# Patient Record
Sex: Female | Born: 1960 | Race: Black or African American | Hispanic: No | Marital: Single | Smoking: Former smoker
Health system: Southern US, Community
[De-identification: ages and names within clinical notes are randomized; demographics above are authoritative.]

## PROBLEM LIST (undated history)

## (undated) DIAGNOSIS — F32A Depression, unspecified: Secondary | ICD-10-CM

## (undated) DIAGNOSIS — N189 Chronic kidney disease, unspecified: Secondary | ICD-10-CM

## (undated) DIAGNOSIS — I10 Essential (primary) hypertension: Secondary | ICD-10-CM

## (undated) DIAGNOSIS — T7840XA Allergy, unspecified, initial encounter: Secondary | ICD-10-CM

## (undated) DIAGNOSIS — J45909 Unspecified asthma, uncomplicated: Secondary | ICD-10-CM

## (undated) DIAGNOSIS — M109 Gout, unspecified: Secondary | ICD-10-CM

## (undated) DIAGNOSIS — I722 Aneurysm of renal artery: Secondary | ICD-10-CM

## (undated) DIAGNOSIS — E785 Hyperlipidemia, unspecified: Secondary | ICD-10-CM

## (undated) DIAGNOSIS — F329 Major depressive disorder, single episode, unspecified: Secondary | ICD-10-CM

## (undated) HISTORY — DX: Chronic kidney disease, unspecified: N18.9

## (undated) HISTORY — DX: Unspecified asthma, uncomplicated: J45.909

## (undated) HISTORY — PX: TOTAL ABDOMINAL HYSTERECTOMY: SHX209

## (undated) HISTORY — DX: Hyperlipidemia, unspecified: E78.5

## (undated) HISTORY — PX: X-STOP IMPLANTATION: SHX2677

## (undated) HISTORY — DX: Allergy, unspecified, initial encounter: T78.40XA

## (undated) HISTORY — DX: Depression, unspecified: F32.A

## (undated) HISTORY — DX: Major depressive disorder, single episode, unspecified: F32.9

## (undated) HISTORY — PX: ABDOMINAL HYSTERECTOMY: SHX81

## (undated) HISTORY — PX: OTHER SURGICAL HISTORY: SHX169

---

## 2000-03-21 ENCOUNTER — Encounter: Admission: RE | Admit: 2000-03-21 | Discharge: 2000-03-21 | Payer: Self-pay | Admitting: Internal Medicine

## 2000-03-21 ENCOUNTER — Encounter: Payer: Self-pay | Admitting: Internal Medicine

## 2000-05-24 ENCOUNTER — Encounter: Payer: Self-pay | Admitting: Emergency Medicine

## 2000-05-24 ENCOUNTER — Emergency Department (HOSPITAL_COMMUNITY): Admission: EM | Admit: 2000-05-24 | Discharge: 2000-05-24 | Payer: Self-pay | Admitting: Emergency Medicine

## 2001-03-30 ENCOUNTER — Emergency Department (HOSPITAL_COMMUNITY): Admission: EM | Admit: 2001-03-30 | Discharge: 2001-03-30 | Payer: Self-pay | Admitting: Internal Medicine

## 2001-08-27 ENCOUNTER — Encounter: Payer: Self-pay | Admitting: Internal Medicine

## 2001-08-27 ENCOUNTER — Ambulatory Visit (HOSPITAL_COMMUNITY): Admission: RE | Admit: 2001-08-27 | Discharge: 2001-08-27 | Payer: Self-pay | Admitting: Internal Medicine

## 2002-10-23 ENCOUNTER — Emergency Department (HOSPITAL_COMMUNITY): Admission: EM | Admit: 2002-10-23 | Discharge: 2002-10-23 | Payer: Self-pay | Admitting: Emergency Medicine

## 2002-12-30 ENCOUNTER — Observation Stay (HOSPITAL_COMMUNITY): Admission: EM | Admit: 2002-12-30 | Discharge: 2002-12-31 | Payer: Self-pay | Admitting: *Deleted

## 2002-12-30 ENCOUNTER — Encounter: Payer: Self-pay | Admitting: Emergency Medicine

## 2002-12-31 ENCOUNTER — Encounter: Payer: Self-pay | Admitting: Cardiology

## 2002-12-31 ENCOUNTER — Encounter: Admission: RE | Admit: 2002-12-31 | Discharge: 2002-12-31 | Payer: Self-pay | Admitting: Cardiology

## 2003-01-17 ENCOUNTER — Emergency Department (HOSPITAL_COMMUNITY): Admission: EM | Admit: 2003-01-17 | Discharge: 2003-01-17 | Payer: Self-pay | Admitting: Emergency Medicine

## 2004-01-30 ENCOUNTER — Encounter: Admission: RE | Admit: 2004-01-30 | Discharge: 2004-01-30 | Payer: Self-pay | Admitting: Cardiovascular Disease

## 2004-02-03 ENCOUNTER — Encounter: Admission: RE | Admit: 2004-02-03 | Discharge: 2004-02-03 | Payer: Self-pay | Admitting: Cardiovascular Disease

## 2004-07-29 ENCOUNTER — Ambulatory Visit (HOSPITAL_COMMUNITY): Admission: RE | Admit: 2004-07-29 | Discharge: 2004-07-29 | Payer: Self-pay | Admitting: Cardiovascular Disease

## 2004-12-27 ENCOUNTER — Emergency Department (HOSPITAL_COMMUNITY): Admission: EM | Admit: 2004-12-27 | Discharge: 2004-12-27 | Payer: Self-pay | Admitting: Emergency Medicine

## 2005-09-20 ENCOUNTER — Ambulatory Visit (HOSPITAL_COMMUNITY): Admission: RE | Admit: 2005-09-20 | Discharge: 2005-09-20 | Payer: Self-pay | Admitting: Cardiovascular Disease

## 2010-07-14 ENCOUNTER — Emergency Department (HOSPITAL_COMMUNITY): Admission: EM | Admit: 2010-07-14 | Discharge: 2010-07-14 | Payer: Self-pay | Admitting: Emergency Medicine

## 2010-07-17 ENCOUNTER — Emergency Department (HOSPITAL_COMMUNITY): Admission: EM | Admit: 2010-07-17 | Discharge: 2010-07-17 | Payer: Self-pay | Admitting: Emergency Medicine

## 2010-10-31 ENCOUNTER — Encounter: Payer: Self-pay | Admitting: Internal Medicine

## 2010-10-31 ENCOUNTER — Encounter: Payer: Self-pay | Admitting: Cardiovascular Disease

## 2014-07-23 ENCOUNTER — Emergency Department (HOSPITAL_COMMUNITY)
Admission: EM | Admit: 2014-07-23 | Discharge: 2014-07-23 | Disposition: A | Discharge status: Home / Self Care | Payer: Managed Care, Other (non HMO) | Attending: Emergency Medicine | Admitting: Emergency Medicine

## 2014-07-23 ENCOUNTER — Emergency Department (HOSPITAL_COMMUNITY): Payer: Managed Care, Other (non HMO)

## 2014-07-23 ENCOUNTER — Encounter (HOSPITAL_COMMUNITY): Payer: Self-pay | Admitting: Emergency Medicine

## 2014-07-23 DIAGNOSIS — I10 Essential (primary) hypertension: Secondary | ICD-10-CM | POA: Insufficient documentation

## 2014-07-23 DIAGNOSIS — M25461 Effusion, right knee: Secondary | ICD-10-CM | POA: Diagnosis not present

## 2014-07-23 DIAGNOSIS — Z7901 Long term (current) use of anticoagulants: Secondary | ICD-10-CM | POA: Diagnosis not present

## 2014-07-23 DIAGNOSIS — Z720 Tobacco use: Secondary | ICD-10-CM | POA: Diagnosis not present

## 2014-07-23 DIAGNOSIS — M25561 Pain in right knee: Secondary | ICD-10-CM | POA: Diagnosis present

## 2014-07-23 DIAGNOSIS — Z79899 Other long term (current) drug therapy: Secondary | ICD-10-CM | POA: Insufficient documentation

## 2014-07-23 HISTORY — DX: Essential (primary) hypertension: I10

## 2014-07-23 HISTORY — DX: Aneurysm of renal artery: I72.2

## 2014-07-23 LAB — SYNOVIAL CELL COUNT + DIFF, W/ CRYSTALS
Crystals, Fluid: NONE SEEN
Eosinophils-Synovial: 0 % (ref 0–1)
Lymphocytes-Synovial Fld: 0 % (ref 0–20)
Monocyte-Macrophage-Synovial Fluid: 14 % — ABNORMAL LOW (ref 50–90)
Neutrophil, Synovial: 86 % — ABNORMAL HIGH (ref 0–25)
Other Cells-SYN: 0
WBC, Synovial: 36750 /mm3 — ABNORMAL HIGH (ref 0–200)

## 2014-07-23 LAB — BODY FLUID CELL COUNT WITH DIFFERENTIAL

## 2014-07-23 MED ORDER — LIDOCAINE HCL (PF) 1 % IJ SOLN
INTRAMUSCULAR | Status: AC
  Filled 2014-07-23: qty 5

## 2014-07-23 MED ORDER — NAPROXEN 250 MG PO TABS
500.0000 mg | ORAL_TABLET | Freq: Once | ORAL | Status: AC
  Administered 2014-07-23: 500 mg via ORAL
  Filled 2014-07-23: qty 2

## 2014-07-23 MED ORDER — NAPROXEN 500 MG PO TABS: 500.0000 mg | ORAL_TABLET | Freq: Two times a day (BID) | ORAL | Status: DC

## 2014-07-23 NOTE — Discharge Instructions (Signed)
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
## 2014-07-23 NOTE — ED Notes (Signed)
nad noted prior to dc. Dc instructions reviewed and explained. 1 Rx called in to [facility] in [place] , [place]
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
## 2014-07-23 NOTE — ED Provider Notes (Signed)
Procedure note.   Right knee tapped.   30 cc of straw colored fluid removed.   Skin numbed with 1% lido no epi.   18 gauge needle used to drain fluid from right knee  Maudry Diego, MD 07/23/14 [phone]

## 2014-07-27 LAB — BODY FLUID CULTURE: Culture: NO GROWTH

## 2014-07-27 NOTE — ED Provider Notes (Signed)
Medical screening examination/treatment/procedure(s) were performed by non-physician practitioner and as supervising physician I was immediately available for consultation/collaboration.   EKG Interpretation None        Maudry Diego, MD 07/27/14 0020

## 2015-01-22 NOTE — H&P (Signed)
  NTS SOAP Note  Vital Signs:  Vitals as of: 4/92/0100: Systolic 712: Diastolic 80: Heart Rate 56: Temp 97.70F: Height 4ft 6in: Weight 183Lbs 0 Ounces: BMI 29.54  BMI : 29.54 kg/m2  Subjective: This 54 year old female presents for of right upper quadrant abdominal pain.  Has been occurring over the past few months.  Made worse with fatty food.  Nausea,  vomiting.  No fever,  chills,  jaundice.  U/S of gallbladder shows cholelithiasis,  normal common bile duct.  LFT's in past normal.   Review of Symptoms:  Constitutional:fatigue Head:unremarkable Eyes:pain bilateral ear infection,  sinus problems Cardiovascular:  unremarkable Respiratory:unremarkable Gastrointestinabdominal pain, nausea, vomiting, heartburn Genitourinary:unremarkable   joint and back pain Skin:unremarkable Hematolgic/Lymphatic:on plavix   Allergic/Immunologic:unremarkable   Past Medical History:  Reviewed  Past Medical History  Surgical History: foot surgery,  TAH Medical Problems: renal artery stenosis,  HTN,  reflux Allergies: shellfish Medications: dilacor,  triamterene/HCTZ,  atenolol,  plavix (which she is holding),  KCL, nexium    Social History:Reviewed  Social History  Preferred Language: English Race:  Black or African American Ethnicity: Not Hispanic / Latino Age: 54 year Marital Status:  S Alcohol: no   Smoking Status: Current some day smoker reviewed on 01/22/2015 Started Date:  Packs per day: 0.50 Functional Status reviewed on 01/22/2015 ------------------------------------------------ Bathing: Normal Cooking: Normal Dressing: Normal Driving: Normal Eating: Normal Managing Meds: Normal Oral Care: Normal Shopping: Normal Toileting: Normal Transferring: Normal Walking: Normal Cognitive Status reviewed on 01/22/2015 ------------------------------------------------ Attention: Normal Decision Making: Normal Language: Normal Memory: Normal Motor:  Normal Perception: Normal Problem Solving: Normal Visual and Spatial: Normal   Family History:Reviewed  Family Health History Mother, Living; Healthy;  Father, Living; Colon cancer; Prostate cancer; Heart block (conduction disorder);     Objective Information: General:Well appearing, well nourished in no distress. Heart:RRR, no murmur Lungs:  CTA bilaterally, no wheezes, rhonchi, rales.  Breathing unlabored. Abdomen:Soft, mild tenderness to palpation right upper quadrant,  ND, no HSM, no masses.  Assessment:Cholelithiasis  Diagnoses: 574.20  K80.20 Gallstone (Calculus of gallbladder without cholecystitis without obstruction)  Procedures: 19758 - OFFICE OUTPATIENT NEW 30 MINUTES    Plan:  Scheduled for laparoscopic cholecystectomy on 01/28/15.  Continue to hold plavix.   Patient Education:Alternative treatments to surgery were discussed with patient (and family).  Risks and benefits  of procedure including bleeding,  infection,  hepatobiliary injury,  and the possibility of an open procedure were fully explained to the patient (and family) who gave informed consent. Patient/family questions were addressed.  Follow-up:Pending Surgery

## 2015-01-23 NOTE — Patient Instructions (Signed)
Jaclyn Brooks  01/23/2015   Your procedure is scheduled on:  01/28/2015  Report to [facility] at  700  AM.  Call this number if you have problems the morning of surgery: [phone]   Remember:   Do not eat food or drink liquids after midnight.   Take these medicines the morning of surgery with A SIP OF WATER:  Atenolol, cardizem, percocet, uloric   Do not wear jewelry, make-up or nail polish.  Do not wear lotions, powders, or perfumes.   Do not shave 48 hours prior to surgery. Men may shave face and neck.  Do not bring valuables to the hospital.  [facility] is not responsible for any belongings or valuables.               Contacts, dentures or bridgework may not be worn into surgery.  Leave suitcase in the car. After surgery it may be brought to your room.  For patients admitted to the hospital, discharge time is determined by your treatment team.               Patients discharged the day of surgery will not be allowed to drive home.  Name and phone number of your driver: family  Special Instructions: Shower using CHG 2 nights before surgery and the night before surgery.  If you shower the day of surgery use CHG.  Use special wash - you have one bottle of CHG for all showers.  You should use approximately 1/3 of the bottle for each shower.   Please read over the following fact sheets that you were given: Pain Booklet, Coughing and Deep Breathing, Surgical Site Infection Prevention, Anesthesia Post-op Instructions and Care and Recovery After Surgery Laparoscopic Cholecystectomy Laparoscopic cholecystectomy is surgery to remove the gallbladder. The gallbladder is located in the upper right part of the abdomen, behind the liver. It is a storage sac for bile produced in the liver. Bile aids in the digestion and absorption of fats. Cholecystectomy is often done for inflammation of the gallbladder (cholecystitis). This condition is usually caused by a buildup of gallstones  (cholelithiasis) in your gallbladder. Gallstones can block the flow of bile, resulting in inflammation and pain. In severe cases, emergency surgery may be required. When emergency surgery is not required, you will have time to prepare for the procedure. Laparoscopic surgery is an alternative to open surgery. Laparoscopic surgery has a shorter recovery time. Your common bile duct may also need to be examined during the procedure. If stones are found in the common bile duct, they may be removed. LET [facility] CARE PROVIDER KNOW ABOUT:  Any allergies you have.  All medicines you are taking, including vitamins, herbs, eye drops, creams, and over-the-counter medicines.  Previous problems you or members of your family have had with the use of anesthetics.  Any blood disorders you have.  Previous surgeries you have had.  Medical conditions you have. RISKS AND COMPLICATIONS Generally, this is a safe procedure. However, as with any procedure, complications can occur. Possible complications include:  Infection.  Damage to the common bile duct, nerves, arteries, veins, or other internal organs such as the stomach, liver, or intestines.  Bleeding.  A stone may remain in the common bile duct.  A bile leak from the cyst duct that is clipped when your gallbladder is removed.  The need to convert to open surgery, which requires a larger incision in the abdomen. This may be necessary if your surgeon  thinks it is not safe to continue with a laparoscopic procedure. BEFORE THE PROCEDURE  Ask your health care provider about changing or stopping any regular medicines. You will need to stop taking aspirin or blood thinners at least 5 days prior to surgery.  Do not eat or drink anything after midnight the night before surgery.  Let your health care provider know if you develop a cold or other infectious problem before surgery. PROCEDURE   You will be given medicine to make you sleep through the  procedure (general anesthetic). A breathing tube will be placed in your mouth.  When you are asleep, your surgeon will make several small cuts (incisions) in your abdomen.  A thin, lighted tube with a tiny camera on the end (laparoscope) is inserted through one of the small incisions. The camera on the laparoscope sends a picture to a TV screen in the operating room. This gives the surgeon a good view inside your abdomen.  A gas will be pumped into your abdomen. This expands your abdomen so that the surgeon has more room to perform the surgery.  Other tools needed for the procedure are inserted through the other incisions. The gallbladder is removed through one of the incisions.  After the removal of your gallbladder, the incisions will be closed with stitches, staples, or skin glue. AFTER THE PROCEDURE  You will be taken to a recovery area where your progress will be checked often.  You may be allowed to go home the same day if your pain is controlled and you can tolerate liquids. Document Released: 09/26/2005 Document Revised: 07/17/2013 Document Reviewed: 05/08/2013 [facility] Patient Information 2015 [place], [place]. This information is not intended to replace advice given to you by your health care provider. Make sure you discuss any questions you have with your health care provider. PATIENT INSTRUCTIONS POST-ANESTHESIA  IMMEDIATELY FOLLOWING SURGERY:  Do not drive or operate machinery for the first twenty four hours after surgery.  Do not make any important decisions for twenty four hours after surgery or while taking narcotic pain medications or sedatives.  If you develop intractable nausea and vomiting or a severe headache please notify your doctor immediately.  FOLLOW-UP:  Please make an appointment with your surgeon as instructed. You do not need to follow up with anesthesia unless specifically instructed to do so.  WOUND CARE INSTRUCTIONS (if applicable):  Keep a dry clean dressing  on the anesthesia/puncture wound site if there is drainage.  Once the wound has quit draining you may leave it open to air.  Generally you should leave the bandage intact for twenty four hours unless there is drainage.  If the epidural site drains for more than 36-48 hours please call the anesthesia department.  QUESTIONS?:  Please feel free to call your physician or the hospital operator if you have any questions, and they will be happy to assist you.

## 2015-01-26 ENCOUNTER — Other Ambulatory Visit: Payer: Self-pay

## 2015-01-26 ENCOUNTER — Encounter (HOSPITAL_COMMUNITY): Payer: Self-pay

## 2015-01-26 ENCOUNTER — Encounter (HOSPITAL_COMMUNITY)
Admission: RE | Admit: 2015-01-26 | Discharge: 2015-01-26 | Disposition: A | Discharge status: Home / Self Care | Payer: Managed Care, Other (non HMO) | Source: Ambulatory Visit | Attending: General Surgery | Admitting: General Surgery

## 2015-01-26 DIAGNOSIS — Z79899 Other long term (current) drug therapy: Secondary | ICD-10-CM | POA: Diagnosis not present

## 2015-01-26 DIAGNOSIS — I739 Peripheral vascular disease, unspecified: Secondary | ICD-10-CM | POA: Diagnosis not present

## 2015-01-26 DIAGNOSIS — F17200 Nicotine dependence, unspecified, uncomplicated: Secondary | ICD-10-CM | POA: Diagnosis not present

## 2015-01-26 DIAGNOSIS — K8020 Calculus of gallbladder without cholecystitis without obstruction: Secondary | ICD-10-CM | POA: Diagnosis present

## 2015-01-26 DIAGNOSIS — I10 Essential (primary) hypertension: Secondary | ICD-10-CM | POA: Diagnosis not present

## 2015-01-26 DIAGNOSIS — Z90710 Acquired absence of both cervix and uterus: Secondary | ICD-10-CM | POA: Diagnosis not present

## 2015-01-26 DIAGNOSIS — K219 Gastro-esophageal reflux disease without esophagitis: Secondary | ICD-10-CM | POA: Diagnosis not present

## 2015-01-26 DIAGNOSIS — K8010 Calculus of gallbladder with chronic cholecystitis without obstruction: Secondary | ICD-10-CM | POA: Diagnosis not present

## 2015-01-26 LAB — CBC WITH DIFFERENTIAL/PLATELET
Basophils Absolute: 0 10*3/uL (ref 0.0–0.1)
Basophils Relative: 0 % (ref 0–1)
Eosinophils Absolute: 0.1 10*3/uL (ref 0.0–0.7)
Eosinophils Relative: 3 % (ref 0–5)
HCT: 42.4 % (ref 36.0–46.0)
Hemoglobin: 14.7 g/dL (ref 12.0–15.0)
Lymphocytes Relative: 53 % — ABNORMAL HIGH (ref 12–46)
Lymphs Abs: 2.7 10*3/uL (ref 0.7–4.0)
MCH: 33.7 pg (ref 26.0–34.0)
MCHC: 34.7 g/dL (ref 30.0–36.0)
MCV: 97.2 fL (ref 78.0–100.0)
Monocytes Absolute: 0.4 10*3/uL (ref 0.1–1.0)
Monocytes Relative: 9 % (ref 3–12)
Neutro Abs: 1.7 10*3/uL (ref 1.7–7.7)
Neutrophils Relative %: 35 % — ABNORMAL LOW (ref 43–77)
Platelets: 252 10*3/uL (ref 150–400)
RBC: 4.36 MIL/uL (ref 3.87–5.11)
RDW: 12.6 % (ref 11.5–15.5)
WBC: 5 10*3/uL (ref 4.0–10.5)

## 2015-01-26 LAB — HEPATIC FUNCTION PANEL
ALT: 25 U/L (ref 0–35)
AST: 19 U/L (ref 0–37)
Albumin: 4 g/dL (ref 3.5–5.2)
Alkaline Phosphatase: 60 U/L (ref 39–117)
Bilirubin, Direct: <0.1 mg/dL (ref 0.0–0.5)
Total Bilirubin: 0.5 mg/dL (ref 0.3–1.2)
Total Protein: 7.5 g/dL (ref 6.0–8.3)

## 2015-01-26 LAB — BASIC METABOLIC PANEL
Anion gap: 10 (ref 5–15)
BUN: 32 mg/dL — ABNORMAL HIGH (ref 6–23)
CO2: 24 mmol/L (ref 19–32)
Calcium: 9.4 mg/dL (ref 8.4–10.5)
Chloride: 106 mmol/L (ref 96–112)
Creatinine, Ser: 1.51 mg/dL — ABNORMAL HIGH (ref 0.50–1.10)
GFR calc Af Amer: 44 mL/min — ABNORMAL LOW (ref >90)
GFR calc non Af Amer: 38 mL/min — ABNORMAL LOW (ref >90)
Glucose, Bld: 102 mg/dL — ABNORMAL HIGH (ref 70–99)
Potassium: 3.5 mmol/L (ref 3.5–5.1)
Sodium: 140 mmol/L (ref 135–145)

## 2015-01-26 NOTE — Pre-Procedure Instructions (Signed)
Patient given information to sign up for my chart at home. 

## 2015-01-28 ENCOUNTER — Ambulatory Visit (HOSPITAL_COMMUNITY)
Admission: RE | Admit: 2015-01-28 | Discharge: 2015-01-28 | Disposition: A | Discharge status: Home / Self Care | Payer: Managed Care, Other (non HMO) | Source: Ambulatory Visit | Attending: General Surgery | Admitting: General Surgery

## 2015-01-28 ENCOUNTER — Ambulatory Visit (HOSPITAL_COMMUNITY): Payer: Managed Care, Other (non HMO) | Admitting: Anesthesiology

## 2015-01-28 ENCOUNTER — Encounter (HOSPITAL_COMMUNITY): Payer: Self-pay | Admitting: Anesthesiology

## 2015-01-28 ENCOUNTER — Encounter (HOSPITAL_COMMUNITY)
Admission: RE | Disposition: A | Discharge status: Home / Self Care | Payer: Self-pay | Source: Ambulatory Visit | Attending: General Surgery

## 2015-01-28 DIAGNOSIS — I10 Essential (primary) hypertension: Secondary | ICD-10-CM | POA: Insufficient documentation

## 2015-01-28 DIAGNOSIS — K8010 Calculus of gallbladder with chronic cholecystitis without obstruction: Secondary | ICD-10-CM | POA: Diagnosis not present

## 2015-01-28 DIAGNOSIS — K219 Gastro-esophageal reflux disease without esophagitis: Secondary | ICD-10-CM | POA: Insufficient documentation

## 2015-01-28 DIAGNOSIS — I739 Peripheral vascular disease, unspecified: Secondary | ICD-10-CM | POA: Insufficient documentation

## 2015-01-28 DIAGNOSIS — Z90710 Acquired absence of both cervix and uterus: Secondary | ICD-10-CM | POA: Insufficient documentation

## 2015-01-28 DIAGNOSIS — F17200 Nicotine dependence, unspecified, uncomplicated: Secondary | ICD-10-CM | POA: Insufficient documentation

## 2015-01-28 DIAGNOSIS — Z79899 Other long term (current) drug therapy: Secondary | ICD-10-CM | POA: Insufficient documentation

## 2015-01-28 HISTORY — PX: CHOLECYSTECTOMY: SHX55

## 2015-01-28 SURGERY — LAPAROSCOPIC CHOLECYSTECTOMY
Anesthesia: General

## 2015-01-28 MED ORDER — SODIUM CHLORIDE 0.9 % IR SOLN
Status: DC | PRN
  Administered 2015-01-28: 1000 mL

## 2015-01-28 MED ORDER — POVIDONE-IODINE 10 % EX OINT
TOPICAL_OINTMENT | CUTANEOUS | Status: AC
  Filled 2015-01-28: qty 1

## 2015-01-28 MED ORDER — BUPIVACAINE HCL (PF) 0.5 % IJ SOLN
INTRAMUSCULAR | Status: DC | PRN
  Administered 2015-01-28: 10 mL

## 2015-01-28 MED ORDER — LIDOCAINE HCL (CARDIAC) 20 MG/ML IV SOLN
INTRAVENOUS | Status: DC | PRN
  Administered 2015-01-28: 50 mg via INTRAVENOUS

## 2015-01-28 MED ORDER — DEXAMETHASONE SODIUM PHOSPHATE 4 MG/ML IJ SOLN
4.0000 mg | Freq: Once | INTRAMUSCULAR | Status: AC
Start: 2015-01-28 — End: 2015-01-28
  Administered 2015-01-28: 4 mg via INTRAVENOUS

## 2015-01-28 MED ORDER — KETOROLAC TROMETHAMINE 30 MG/ML IJ SOLN
INTRAMUSCULAR | Status: AC
  Filled 2015-01-28: qty 1

## 2015-01-28 MED ORDER — GLYCOPYRROLATE 0.2 MG/ML IJ SOLN
INTRAMUSCULAR | Status: AC
  Filled 2015-01-28: qty 1

## 2015-01-28 MED ORDER — DEXAMETHASONE SODIUM PHOSPHATE 4 MG/ML IJ SOLN
INTRAMUSCULAR | Status: AC
  Filled 2015-01-28: qty 1

## 2015-01-28 MED ORDER — NEOSTIGMINE METHYLSULFATE 10 MG/10ML IV SOLN
INTRAVENOUS | Status: DC | PRN
Start: 2015-01-28 — End: 2015-01-28
  Administered 2015-01-28: 4 mg via INTRAVENOUS

## 2015-01-28 MED ORDER — FENTANYL CITRATE (PF) 100 MCG/2ML IJ SOLN
INTRAMUSCULAR | Status: AC
  Filled 2015-01-28: qty 2

## 2015-01-28 MED ORDER — GLYCOPYRROLATE 0.2 MG/ML IJ SOLN
INTRAMUSCULAR | Status: DC | PRN
  Administered 2015-01-28: 0.6 mg via INTRAVENOUS

## 2015-01-28 MED ORDER — SUCCINYLCHOLINE CHLORIDE 20 MG/ML IJ SOLN
INTRAMUSCULAR | Status: DC | PRN
  Administered 2015-01-28: 120 mg via INTRAVENOUS

## 2015-01-28 MED ORDER — ROCURONIUM BROMIDE 50 MG/5ML IV SOLN
INTRAVENOUS | Status: AC
  Filled 2015-01-28: qty 1

## 2015-01-28 MED ORDER — OXYCODONE-ACETAMINOPHEN 5-325 MG PO TABS: 1.0000 | ORAL_TABLET | Freq: Four times a day (QID) | ORAL | Status: DC | PRN

## 2015-01-28 MED ORDER — FENTANYL CITRATE (PF) 250 MCG/5ML IJ SOLN
INTRAMUSCULAR | Status: AC
  Filled 2015-01-28: qty 5

## 2015-01-28 MED ORDER — LIDOCAINE HCL (PF) 1 % IJ SOLN
INTRAMUSCULAR | Status: AC
  Filled 2015-01-28: qty 5

## 2015-01-28 MED ORDER — DEXTROSE 5 % IV SOLN
INTRAVENOUS | Status: DC | PRN
  Administered 2015-01-28: 09:00:00 via INTRAVENOUS

## 2015-01-28 MED ORDER — CHLORHEXIDINE GLUCONATE 4 % EX LIQD: 1.0000 "application " | Freq: Once | CUTANEOUS | Status: DC

## 2015-01-28 MED ORDER — LACTATED RINGERS IV SOLN
INTRAVENOUS | Status: DC
  Administered 2015-01-28: 09:00:00 via INTRAVENOUS
  Administered 2015-01-28: 1000 mL via INTRAVENOUS

## 2015-01-28 MED ORDER — ROCURONIUM BROMIDE 100 MG/10ML IV SOLN
INTRAVENOUS | Status: DC | PRN
  Administered 2015-01-28: 10 mg via INTRAVENOUS
  Administered 2015-01-28: 20 mg via INTRAVENOUS

## 2015-01-28 MED ORDER — ARTIFICIAL TEARS OP OINT
TOPICAL_OINTMENT | OPHTHALMIC | Status: AC
  Filled 2015-01-28: qty 3.5

## 2015-01-28 MED ORDER — KETOROLAC TROMETHAMINE 30 MG/ML IJ SOLN
30.0000 mg | Freq: Once | INTRAMUSCULAR | Status: AC
  Administered 2015-01-28: 30 mg via INTRAVENOUS

## 2015-01-28 MED ORDER — FENTANYL CITRATE (PF) 100 MCG/2ML IJ SOLN
INTRAMUSCULAR | Status: DC | PRN
  Administered 2015-01-28: 50 ug via INTRAVENOUS
  Administered 2015-01-28: 100 ug via INTRAVENOUS
  Administered 2015-01-28 (×2): 50 ug via INTRAVENOUS

## 2015-01-28 MED ORDER — ONDANSETRON HCL 4 MG/2ML IJ SOLN
INTRAMUSCULAR | Status: AC
  Filled 2015-01-28: qty 2

## 2015-01-28 MED ORDER — MIDAZOLAM HCL 2 MG/2ML IJ SOLN
1.0000 mg | INTRAMUSCULAR | Status: DC | PRN
  Administered 2015-01-28: 2 mg via INTRAVENOUS

## 2015-01-28 MED ORDER — ONDANSETRON HCL 4 MG/2ML IJ SOLN
4.0000 mg | Freq: Once | INTRAMUSCULAR | Status: AC
  Administered 2015-01-28: 4 mg via INTRAVENOUS

## 2015-01-28 MED ORDER — BUPIVACAINE HCL (PF) 0.5 % IJ SOLN
INTRAMUSCULAR | Status: AC
  Filled 2015-01-28: qty 30

## 2015-01-28 MED ORDER — ONDANSETRON HCL 4 MG/2ML IJ SOLN
4.0000 mg | Freq: Once | INTRAMUSCULAR | Status: AC | PRN
  Administered 2015-01-28: 4 mg via INTRAVENOUS

## 2015-01-28 MED ORDER — CIPROFLOXACIN IN D5W 400 MG/200ML IV SOLN
400.0000 mg | INTRAVENOUS | Status: AC
  Administered 2015-01-28: 400 mg via INTRAVENOUS
  Filled 2015-01-28: qty 200

## 2015-01-28 MED ORDER — FENTANYL CITRATE (PF) 100 MCG/2ML IJ SOLN
25.0000 ug | INTRAMUSCULAR | Status: DC | PRN
  Administered 2015-01-28 (×2): 50 ug via INTRAVENOUS

## 2015-01-28 MED ORDER — MIDAZOLAM HCL 2 MG/2ML IJ SOLN
INTRAMUSCULAR | Status: AC
  Filled 2015-01-28: qty 2

## 2015-01-28 MED ORDER — HEMOSTATIC AGENTS (NO CHARGE) OPTIME
TOPICAL | Status: DC | PRN
Start: 2015-01-28 — End: 2015-01-28
  Administered 2015-01-28: 1 via TOPICAL

## 2015-01-28 MED ORDER — GLYCOPYRROLATE 0.2 MG/ML IJ SOLN
0.2000 mg | Freq: Once | INTRAMUSCULAR | Status: AC
  Administered 2015-01-28: 0.2 mg via INTRAVENOUS

## 2015-01-28 MED ORDER — MEPERIDINE HCL 50 MG/ML IJ SOLN: 6.2500 mg | Freq: Once | INTRAMUSCULAR | Status: DC

## 2015-01-28 SURGICAL SUPPLY — 46 items
APPLIER CLIP LAPSCP 10X32 DD (CLIP) ×2 IMPLANT
BAG HAMPER (MISCELLANEOUS) ×2 IMPLANT
BAG SPEC RTRVL LRG 6X4 10 (ENDOMECHANICALS) ×1
CHLORAPREP W/TINT 26ML (MISCELLANEOUS) ×2 IMPLANT
CLOTH BEACON ORANGE TIMEOUT ST (SAFETY) ×2 IMPLANT
COVER LIGHT HANDLE STERIS (MISCELLANEOUS) ×4 IMPLANT
CUTTER LINEAR ENDO 35 ART THIN (STAPLE) ×1 IMPLANT
DECANTER SPIKE VIAL GLASS SM (MISCELLANEOUS) ×2 IMPLANT
ELECT REM PT RETURN 9FT ADLT (ELECTROSURGICAL) ×2
ELECTRODE REM PT RTRN 9FT ADLT (ELECTROSURGICAL) ×1 IMPLANT
FILTER SMOKE EVAC LAPAROSHD (FILTER) ×2 IMPLANT
FORMALIN 10 PREFIL 120ML (MISCELLANEOUS) ×2 IMPLANT
GLOVE BIO SURGEON STRL SZ 6.5 (GLOVE) ×2 IMPLANT
GLOVE BIOGEL PI IND STRL 7.0 (GLOVE) IMPLANT
GLOVE BIOGEL PI IND STRL 7.5 (GLOVE) IMPLANT
GLOVE BIOGEL PI INDICATOR 7.0 (GLOVE) ×2
GLOVE BIOGEL PI INDICATOR 7.5 (GLOVE) ×1
GLOVE SURG SS PI 7.5 STRL IVOR (GLOVE) ×2 IMPLANT
GOWN STRL REUS W/ TWL XL LVL3 (GOWN DISPOSABLE) ×1 IMPLANT
GOWN STRL REUS W/TWL LRG LVL3 (GOWN DISPOSABLE) ×4 IMPLANT
GOWN STRL REUS W/TWL XL LVL3 (GOWN DISPOSABLE) ×2
HEMOSTAT SNOW SURGICEL 2X4 (HEMOSTASIS) ×2 IMPLANT
INST SET LAPROSCOPIC AP (KITS) ×2 IMPLANT
IV NS IRRIG 3000ML ARTHROMATIC (IV SOLUTION) IMPLANT
KIT ROOM TURNOVER APOR (KITS) ×2 IMPLANT
MANIFOLD NEPTUNE II (INSTRUMENTS) ×2 IMPLANT
NDL INSUFFLATION 14GA 120MM (NEEDLE) ×1 IMPLANT
NEEDLE INSUFFLATION 14GA 120MM (NEEDLE) ×2 IMPLANT
NS IRRIG 1000ML POUR BTL (IV SOLUTION) ×2 IMPLANT
PACK LAP CHOLE LZT030E (CUSTOM PROCEDURE TRAY) ×2 IMPLANT
PAD ARMBOARD 7.5X6 YLW CONV (MISCELLANEOUS) ×2 IMPLANT
PENCIL HANDSWITCHING (ELECTRODE) ×1 IMPLANT
POUCH SPECIMEN RETRIEVAL 10MM (ENDOMECHANICALS) ×2 IMPLANT
SET BASIN LINEN APH (SET/KITS/TRAYS/PACK) ×2 IMPLANT
SET TUBE IRRIG SUCTION NO TIP (IRRIGATION / IRRIGATOR) IMPLANT
SLEEVE ENDOPATH XCEL 5M (ENDOMECHANICALS) ×2 IMPLANT
SPONGE GAUZE 2X2 8PLY STRL LF (GAUZE/BANDAGES/DRESSINGS) ×8 IMPLANT
STAPLER VISISTAT (STAPLE) ×2 IMPLANT
SUT VICRYL 0 UR6 27IN ABS (SUTURE) ×3 IMPLANT
TAPE CLOTH SURG 4X10 WHT LF (GAUZE/BANDAGES/DRESSINGS) ×1 IMPLANT
TROCAR ENDO BLADELESS 11MM (ENDOMECHANICALS) ×2 IMPLANT
TROCAR XCEL NON-BLD 5MMX100MML (ENDOMECHANICALS) ×2 IMPLANT
TROCAR XCEL UNIV SLVE 11M 100M (ENDOMECHANICALS) ×2 IMPLANT
TUBING INSUFFLATION (TUBING) ×2 IMPLANT
WARMER LAPAROSCOPE (MISCELLANEOUS) ×2 IMPLANT
YANKAUER SUCT 12FT TUBE ARGYLE (SUCTIONS) ×2 IMPLANT

## 2015-01-28 NOTE — Anesthesia Procedure Notes (Signed)
Procedure Name: Intubation Date/Time: 01/28/2015 8:51 AM Performed by: Andree Elk, Jeania Nater A Pre-anesthesia Checklist: Patient identified, Patient being monitored, Timeout performed, Emergency Drugs available and Suction available Patient Re-evaluated:Patient Re-evaluated prior to inductionOxygen Delivery Method: Circle System Utilized Preoxygenation: Pre-oxygenation with 100% oxygen Intubation Type: IV induction, Rapid sequence and Cricoid Pressure applied Laryngoscope Size: 3 and Miller Grade View: Grade I Tube type: Oral Tube size: 7.0 mm Number of attempts: 1 Airway Equipment and Method: Stylet Placement Confirmation: ETT inserted through vocal cords under direct vision,  positive ETCO2 and breath sounds checked- equal and bilateral Secured at: 21 cm Tube secured with: Tape Dental Injury: Teeth and Oropharynx as per pre-operative assessment

## 2015-01-28 NOTE — Interval H&P Note (Signed)
History and Physical Interval Note:  01/28/2015 8:07 AM  Jaclyn Brooks  has presented today for surgery, with the diagnosis of cholelithiasis  The various methods of treatment have been discussed with the patient and family. After consideration of risks, benefits and other options for treatment, the patient has consented to  Procedure(s): LAPAROSCOPIC CHOLECYSTECTOMY (N/A) as a surgical intervention .  The patient's history has been reviewed, patient examined, no change in status, stable for surgery.  I have reviewed the patient's chart and labs.  Questions were answered to the patient's satisfaction.     Aviva Signs A

## 2015-01-28 NOTE — Op Note (Signed)
Patient:  Jaclyn Brooks  DOB:  Apr 05, 1961  MRN:  [id number]   Preop Diagnosis:  Cholecystitis, cholelithiasis  Postop Diagnosis:  Same  Procedure:  Laparoscopic cholecystectomy  Surgeon:  Aviva Signs, M.D.  Anes:  Gen. endotracheal  Indications:  Patient is a 54 year old black female who presents with biliary colic secondary to cholelithiasis. The risks and benefits of the procedure including bleeding, infection, hepatobiliary injury, and the possibility of an open procedure were fully explained to the patient, who gave informed consent.  Procedure note:  The patient was placed the supine position. After induction of general endotracheal anesthesia, the abdomen was prepped and draped using usual sterile technique with ChloraPrep. Surgical site confirmation was performed.  A supraumbilical incision was made down the fascia. A Veress needle was introduced into the abdominal cavity and confirmation of placement was done using the saline drop test. The abdomen was then insufflated to 16 mmHg pressure. An 11 mm trocar was introduced into the abdominal cavity under direct visualization without difficulty. The patient was placed in reverse Trendelenburg position and an additional 11 mm trocar was placed the epigastric region and 5 mm trochars were placed the right upper quadrant and right flank regions. Liver was inspected and noted to be within normal limits. The gallbladder was retracted in a dynamic fashion in order to expose the triangle of Calot. The cystic duct was first identified. Its junction to the infundibulum was fully identified. The were several stones within the cystic duct. These were moved into the lumen of the gallbladder. A vascular Endo GIA was then placed across the cystic duct and fired. The cystic artery was ligated divided using clips. The gallbladder was freed away from the gallbladder fossa using Bovie electrocautery. The gallbladder was delivered through the epigastric trocar  site using an Endo Catch bag. The gallbladder fossa was inspected and no abnormal bleeding or bile leakage was noted. Surgicel is placed the gallbladder fossa. All fluid and air were then evacuated from the abdominal cavity prior to removal of the trochars.  All wounds were irrigated with normal saline. All wounds were injected with 0.5% Sensorcaine. The supraumbilical fascia as well as epigastric fascia were reapproximated using 0 Vicryl interrupted sutures. All skin incisions were closed using staples. Dry sterile dressings were applied.  All tape and needle counts were correct at the end of the procedure. Patient was extubated in the operating room and transferred to PACU in stable condition.  Complications:  None  EBL:  Minimal  Specimen:  Gallbladder

## 2015-01-28 NOTE — Discharge Instructions (Signed)

## 2015-01-28 NOTE — Anesthesia Postprocedure Evaluation (Signed)
  Anesthesia Post-op Note Late Entry Patient: Jaclyn Brooks  Procedure(s) Performed: Procedure(s): LAPAROSCOPIC CHOLECYSTECTOMY (N/A)  Patient Location: PACU  Anesthesia Type:General  Level of Consciousness: awake, alert , oriented and patient cooperative  Airway and Oxygen Therapy: Patient Spontanous Breathing  Post-op Pain: mild  Post-op Assessment: Post-op Vital signs reviewed, Patient's Cardiovascular Status Stable, Respiratory Function Stable, Patent Airway and Pain level controlled  Post-op Vital Signs: Reviewed and stable  Last Vitals:  Filed Vitals:   01/28/15 1037  BP: 133/63  Pulse: 49  Temp:   Resp: 14    Complications: No apparent anesthesia complications

## 2015-01-28 NOTE — Anesthesia Preprocedure Evaluation (Signed)
Anesthesia Evaluation  Patient identified by MRN, date of birth, ID band Patient awake    Reviewed: Allergy & Precautions, NPO status , Patient's Chart, lab work & pertinent test results, reviewed documented beta blocker date and time   Airway Mallampati: II  TM Distance: >3 FB     Dental  (+) Teeth Intact   Pulmonary Current Smoker,  breath sounds clear to auscultation        Cardiovascular hypertension, Pt. on medications and Pt. on home beta blockers + Peripheral Vascular Disease (renal artery stenosis - s/p stent) Rhythm:Regular Rate:Normal     Neuro/Psych    GI/Hepatic negative GI ROS, GERD- (nausea today)  ,  Endo/Other    Renal/GU      Musculoskeletal   Abdominal   Peds  Hematology   Anesthesia Other Findings plavix stopped 5 days   Reproductive/Obstetrics                             Anesthesia Physical Anesthesia Plan  ASA: III  Anesthesia Plan: General   Post-op Pain Management:    Induction: Intravenous, Rapid sequence and Cricoid pressure planned  Airway Management Planned: Oral ETT  Additional Equipment:   Intra-op Plan:   Post-operative Plan: Extubation in OR  Informed Consent: I have reviewed the patients History and Physical, chart, labs and discussed the procedure including the risks, benefits and alternatives for the proposed anesthesia with the patient or authorized representative who has indicated his/her understanding and acceptance.     Plan Discussed with:   Anesthesia Plan Comments: (Nauseated this AM.)        Anesthesia Quick Evaluation

## 2015-01-28 NOTE — Transfer of Care (Signed)
Immediate Anesthesia Transfer of Care Note  Patient: Jaclyn Brooks  Procedure(s) Performed: Procedure(s): LAPAROSCOPIC CHOLECYSTECTOMY (N/A)  Patient Location: PACU  Anesthesia Type:General  Level of Consciousness: awake, oriented and patient cooperative  Airway & Oxygen Therapy: Patient Spontanous Breathing and Patient connected to face mask oxygen  Post-op Assessment: Report given to RN and Post -op Vital signs reviewed and stable  Post vital signs: Reviewed and stable  Last Vitals:  Filed Vitals:   01/28/15 0840  BP: 120/77  Pulse:   Temp:   Resp: 19    Complications: No apparent anesthesia complications

## 2015-01-29 ENCOUNTER — Encounter (HOSPITAL_COMMUNITY): Payer: Self-pay | Admitting: General Surgery

## 2015-01-30 ENCOUNTER — Encounter (HOSPITAL_COMMUNITY): Payer: Self-pay | Admitting: Emergency Medicine

## 2015-01-30 ENCOUNTER — Emergency Department (HOSPITAL_COMMUNITY)
Admission: EM | Admit: 2015-01-30 | Discharge: 2015-01-30 | Disposition: A | Discharge status: Home / Self Care | Payer: Managed Care, Other (non HMO) | Attending: Emergency Medicine | Admitting: Emergency Medicine

## 2015-01-30 DIAGNOSIS — Z7982 Long term (current) use of aspirin: Secondary | ICD-10-CM | POA: Insufficient documentation

## 2015-01-30 DIAGNOSIS — Z7901 Long term (current) use of anticoagulants: Secondary | ICD-10-CM | POA: Diagnosis not present

## 2015-01-30 DIAGNOSIS — Z79899 Other long term (current) drug therapy: Secondary | ICD-10-CM | POA: Diagnosis not present

## 2015-01-30 DIAGNOSIS — L089 Local infection of the skin and subcutaneous tissue, unspecified: Secondary | ICD-10-CM | POA: Diagnosis not present

## 2015-01-30 DIAGNOSIS — R21 Rash and other nonspecific skin eruption: Secondary | ICD-10-CM | POA: Diagnosis present

## 2015-01-30 DIAGNOSIS — I10 Essential (primary) hypertension: Secondary | ICD-10-CM | POA: Diagnosis not present

## 2015-01-30 DIAGNOSIS — Z720 Tobacco use: Secondary | ICD-10-CM | POA: Insufficient documentation

## 2015-01-30 MED ORDER — DOXYCYCLINE HYCLATE 100 MG PO TABS
100.0000 mg | ORAL_TABLET | Freq: Once | ORAL | Status: DC
Start: 2015-01-30 — End: 2015-01-31
  Filled 2015-01-30: qty 1

## 2015-01-30 MED ORDER — DOXYCYCLINE HYCLATE 100 MG IV SOLR
100.0000 mg | Freq: Once | INTRAVENOUS | Status: DC
  Filled 2015-01-30: qty 100

## 2015-01-30 MED ORDER — DOXYCYCLINE HYCLATE 100 MG PO TABS
ORAL_TABLET | ORAL | Status: AC
  Administered 2015-01-30: 100 mg
  Filled 2015-01-30: qty 1

## 2015-01-30 MED ORDER — DOXYCYCLINE HYCLATE 100 MG PO CAPS: 100.0000 mg | ORAL_CAPSULE | Freq: Two times a day (BID) | ORAL | Status: DC

## 2015-01-30 NOTE — ED Notes (Signed)
Patient complaining of blisters/rash to back of right lower leg. Reports started on Wednesday and has worsened. States she had cholecystectomy on Wednesday and has been taking percocet as prescribed for pain. Reports burning/stining sensation where rash is. Denies itching to area.

## 2015-01-30 NOTE — ED Provider Notes (Addendum)
CSN: [id number]     Arrival date & time 01/30/15  2006 History  This chart was scribed for Milton Ferguson, MD by Irene Pap, ED Scribe. This patient was seen in room [facility] and patient care was started at 9:38 PM.      Chief Complaint  Patient presents with  . Herpes Zoster   Patient is a 54 y.o. female presenting with rash. The history is provided by the patient. No language interpreter was used.  Rash Location:  Leg Leg rash location:  R upper leg Quality: blistering   Quality: not painful   Severity:  Mild Onset quality:  Sudden Duration:  2 days Timing:  Constant Progression:  Unchanged Chronicity:  New Ineffective treatments:  None tried Associated symptoms: no abdominal pain, no diarrhea, no fatigue and no headaches     HPI Comments: Jaclyn Brooks is a 54 y.o. female who presents to the Emergency Department complaining of Herpes Zoster onset 2 days ago. She states that she just had a cholecystectomy and noticed a rash on the back of her right thigh. She states that she did notice a [place]ruzz[place] spider in the garden and wanted to get it checked out, but states that she was told that she had shingles. She denies having these spots anywhere else on her body or pain in the area. She reports being on pain medication for her cholecystectomy.  Past Medical History  Diagnosis Date  . Hypertension   . Renal arterial aneurysm    Past Surgical History  Procedure Laterality Date  . Renal aneurysm Right     right coil-and only has function of left kidney  . Abdominal hysterectomy    . Excision of bone spurs Bilateral     Big toes  . X-stop implantation    . Cholecystectomy N/A 01/28/2015    Procedure: LAPAROSCOPIC CHOLECYSTECTOMY;  Surgeon: Aviva Signs Md, MD;  Location: [facility];  Service: General;  Laterality: N/A;   History reviewed. No pertinent family history. History  Substance Use Topics  . Smoking status: Current Every Day Smoker -- 0.25 packs/day for 20 years    Types:  Cigarettes  . Smokeless tobacco: Not on file  . Alcohol Use: No   OB History    No data available     Review of Systems  Constitutional: Negative for appetite change and fatigue.  HENT: Negative for congestion, ear discharge and sinus pressure.   Eyes: Negative for discharge.  Respiratory: Negative for cough.   Cardiovascular: Negative for chest pain.  Gastrointestinal: Negative for abdominal pain and diarrhea.  Genitourinary: Negative for frequency and hematuria.  Musculoskeletal: Negative for back pain.  Skin: Positive for rash.  Neurological: Negative for seizures and headaches.  Psychiatric/Behavioral: Negative for hallucinations.   Allergies  Bee venom and Shellfish allergy  Home Medications   Prior to Admission medications   Medication Sig Start Date End Date Taking? Authorizing Provider  aspirin 81 MG tablet Take 81 mg by mouth daily.    Historical Provider, MD  atenolol (TENORMIN) 50 MG tablet Take 50 mg by mouth daily. 07/10/14   Historical Provider, MD  colchicine 0.6 MG tablet Take 0.6 mg by mouth daily.    Historical Provider, MD  diltiazem (CARDIZEM CD) 240 MG 24 hr capsule Take 240 mg by mouth daily. 07/03/14   Historical Provider, MD  oxyCODONE-acetaminophen (PERCOCET/ROXICET) 5-325 MG per tablet Take 1-2 tablets by mouth every 6 (six) hours as needed for severe pain. 01/28/15   Rosalita Levan, MD  PLAVIX 75 MG tablet Take 75 mg by mouth daily. 07/08/14   Historical Provider, MD  potassium chloride (K-DUR) 10 MEQ tablet Take 10 mEq by mouth daily. 07/03/14   Historical Provider, MD  triamterene-hydrochlorothiazide (MAXZIDE-25) 37.5-25 MG per tablet Take 1 tablet by mouth daily. 07/03/14   Historical Provider, MD  ULORIC 40 MG tablet Take 40 mg by mouth daily. 07/15/14   Historical Provider, MD   BP 143/75 mmHg  Pulse 50  Temp(Src) 99 F (37.2 C) (Oral)  Resp 18  Ht 5\' 6"  (1.676 m)  Wt 175 lb (79.379 kg)  BMI 28.26 kg/m2  SpO2 100%   Physical Exam   Constitutional: She is oriented to person, place, and time. She appears well-developed.  HENT:  Head: Normocephalic.  Eyes: Conjunctivae and EOM are normal. No scleral icterus.  Neck: Neck supple. No tracheal deviation present. No thyromegaly present.  Cardiovascular: Normal rate and regular rhythm.  Exam reveals no gallop and no friction rub.   No murmur heard. Pulmonary/Chest: No stridor. She has no wheezes. She has no rales. She exhibits no tenderness.  Abdominal: She exhibits no distension. There is no tenderness. There is no rebound.  Musculoskeletal: Normal range of motion. She exhibits no edema.  Lymphadenopathy:    She has no cervical adenopathy.  Neurological: She is oriented to person, place, and time. She exhibits normal muscle tone. Coordination normal.  Skin: Skin is warm. No rash noted. No erythema.  3 x 2 cm blistered rash to back of right thigh  Psychiatric: She has a normal mood and affect. Her behavior is normal.    ED Course  Procedures (including critical care time) DIAGNOSTIC STUDIES: Oxygen Saturation is 100% on room air, normal by my interpretation.    COORDINATION OF CARE: 9:42 PM-Discussed treatment plan which includes anti-biotics with pt at bedside and pt agreed to plan.   Labs Review Labs Reviewed - No data to display  Imaging Review No results found.   EKG Interpretation None      MDM   Final diagnoses:  None    Cellulitis,  tx with doxy  The chart was scribed for me under my direct supervision.  I personally performed the history, physical, and medical decision making and all procedures in the evaluation of this patient.Milton Ferguson, MD 01/30/15 2150  Milton Ferguson, MD 01/30/15 2151

## 2015-01-30 NOTE — Discharge Instructions (Signed)
Follow up next week for recheck °

## 2016-03-15 ENCOUNTER — Encounter (HOSPITAL_COMMUNITY): Payer: Self-pay | Admitting: Emergency Medicine

## 2016-03-15 DIAGNOSIS — J029 Acute pharyngitis, unspecified: Secondary | ICD-10-CM | POA: Insufficient documentation

## 2016-03-15 DIAGNOSIS — I722 Aneurysm of renal artery: Secondary | ICD-10-CM | POA: Insufficient documentation

## 2016-03-15 DIAGNOSIS — F17210 Nicotine dependence, cigarettes, uncomplicated: Secondary | ICD-10-CM | POA: Insufficient documentation

## 2016-03-15 DIAGNOSIS — I10 Essential (primary) hypertension: Secondary | ICD-10-CM | POA: Insufficient documentation

## 2016-03-15 DIAGNOSIS — Z79899 Other long term (current) drug therapy: Secondary | ICD-10-CM | POA: Insufficient documentation

## 2016-03-15 DIAGNOSIS — Z7982 Long term (current) use of aspirin: Secondary | ICD-10-CM | POA: Insufficient documentation

## 2016-03-15 NOTE — ED Notes (Signed)
Pt c/o sore throat for 3 days.

## 2016-03-16 ENCOUNTER — Emergency Department (HOSPITAL_COMMUNITY)
Admission: EM | Admit: 2016-03-16 | Discharge: 2016-03-16 | Disposition: A | Discharge status: Home / Self Care | Payer: Managed Care, Other (non HMO) | Attending: Emergency Medicine | Admitting: Emergency Medicine

## 2016-03-16 DIAGNOSIS — J029 Acute pharyngitis, unspecified: Secondary | ICD-10-CM

## 2016-03-16 LAB — RAPID STREP SCREEN (MED CTR MEBANE ONLY): Streptococcus, Group A Screen (Direct): NEGATIVE

## 2016-03-16 MED ORDER — AMOXICILLIN 500 MG PO CAPS: 500.0000 mg | ORAL_CAPSULE | Freq: Three times a day (TID) | ORAL | Status: DC

## 2016-03-16 MED ORDER — HYDROCODONE-ACETAMINOPHEN 5-325 MG PO TABS: 2.0000 | ORAL_TABLET | ORAL | Status: DC | PRN

## 2016-03-16 MED ORDER — AMOXICILLIN 250 MG PO CAPS
500.0000 mg | ORAL_CAPSULE | Freq: Once | ORAL | Status: AC
  Administered 2016-03-16: 500 mg via ORAL
  Filled 2016-03-16: qty 2

## 2016-03-16 MED ORDER — HYDROCODONE-ACETAMINOPHEN 5-325 MG PO TABS
2.0000 | ORAL_TABLET | Freq: Once | ORAL | Status: AC
  Administered 2016-03-16: 2 via ORAL
  Filled 2016-03-16: qty 2

## 2016-03-16 NOTE — ED Notes (Signed)
Pt came out of the bathroom upset due to edpa not seeing the white on her throat that was there. I went to the pa to inform her and she asked me to ask the pt what else she wanted her to do. That she was going to treat here with pain medication and antibiotic. Pt states she will make her complaint to director tomorrow.

## 2016-03-16 NOTE — ED Provider Notes (Signed)
CSN: [id number]     Arrival date & time 03/15/16  2151 History   First MD Initiated Contact with Patient 03/16/16 0015     Chief Complaint  Patient presents with  . Sore Throat     (Consider location/radiation/quality/duration/timing/severity/associated sxs/prior Treatment) Patient is a 55 y.o. female presenting with pharyngitis. The history is provided by the patient. No language interpreter was used.  Sore Throat This is a new problem. Episode onset: 3 days. The problem occurs constantly. The problem has been unchanged. Associated symptoms include a sore throat. Nothing aggravates the symptoms. She has tried nothing for the symptoms. The treatment provided no relief.  Pt reports she feels like she began having a fever this am.   Past Medical History  Diagnosis Date  . Hypertension   . Renal arterial aneurysm [facility])    Past Surgical History  Procedure Laterality Date  . Renal aneurysm Right     right coil-and only has function of left kidney  . Abdominal hysterectomy    . Excision of bone spurs Bilateral     Big toes  . X-stop implantation    . Cholecystectomy N/A 01/28/2015    Procedure: LAPAROSCOPIC CHOLECYSTECTOMY;  Surgeon: Aviva Signs Md, MD;  Location: [facility];  Service: General;  Laterality: N/A;   No family history on file. Social History  Substance Use Topics  . Smoking status: Current Every Day Smoker -- 0.25 packs/day for 20 years    Types: Cigarettes  . Smokeless tobacco: None  . Alcohol Use: No   OB History    No data available     Review of Systems  HENT: Positive for sore throat.   All other systems reviewed and are negative.     Allergies  Bee venom and Shellfish allergy  Home Medications   Prior to Admission medications   Medication Sig Start Date End Date Taking? Authorizing Provider  amoxicillin (AMOXIL) 500 MG capsule Take 1 capsule (500 mg total) by mouth 3 (three) times daily. 03/16/16   Fransico Meadow, PA-C  aspirin 81 MG tablet Take 81 mg  by mouth daily.    Historical Provider, MD  atenolol (TENORMIN) 50 MG tablet Take 50 mg by mouth daily. 07/10/14   Historical Provider, MD  colchicine 0.6 MG tablet Take 0.6 mg by mouth daily.    Historical Provider, MD  diltiazem (CARDIZEM CD) 240 MG 24 hr capsule Take 240 mg by mouth daily. 07/03/14   Historical Provider, MD  doxycycline (VIBRAMYCIN) 100 MG capsule Take 1 capsule (100 mg total) by mouth 2 (two) times daily. One po bid x 7 days 01/30/15   Milton Ferguson, MD  HYDROcodone-acetaminophen (NORCO/VICODIN) 5-325 MG tablet Take 2 tablets by mouth every 4 (four) hours as needed. 03/16/16   Fransico Meadow, PA-C  oxyCODONE-acetaminophen (PERCOCET/ROXICET) 5-325 MG per tablet Take 1-2 tablets by mouth every 6 (six) hours as needed for severe pain. 01/28/15   Aviva Signs, MD  PLAVIX 75 MG tablet Take 75 mg by mouth daily. 07/08/14   Historical Provider, MD  potassium chloride (K-DUR) 10 MEQ tablet Take 10 mEq by mouth daily. 07/03/14   Historical Provider, MD  triamterene-hydrochlorothiazide (MAXZIDE-25) 37.5-25 MG per tablet Take 1 tablet by mouth daily. 07/03/14   Historical Provider, MD  ULORIC 40 MG tablet Take 40 mg by mouth daily. 07/15/14   Historical Provider, MD   BP 135/81 mmHg  Pulse 63  Temp(Src) 100.3 F (37.9 C) (Tympanic)  Resp 20  Ht 5\' 6"  (1.676  m)  Wt 72.576 kg  BMI 25.84 kg/m2  SpO2 100% Physical Exam  Constitutional: She is oriented to person, place, and time. She appears well-developed and well-nourished.  HENT:  Head: Normocephalic and atraumatic.  Right Ear: External ear normal.  Left Ear: External ear normal.  Erythema pharynx.  No drainage.   Eyes: EOM are normal. Pupils are equal, round, and reactive to light.  Neck: Normal range of motion.  Cardiovascular: Normal rate.   Pulmonary/Chest: Effort normal.  Abdominal: She exhibits no distension.  Musculoskeletal: Normal range of motion.  Neurological: She is alert and oriented to person, place, and time.  Skin:  Skin is warm.  Psychiatric: She has a normal mood and affect.  Nursing note and vitals reviewed.   ED Course  Procedures (including critical care time) Labs Review Labs Reviewed  RAPID STREP SCREEN (NOT AT [facility])  CULTURE, GROUP A STREP [facility])    Imaging Review No results found. I have personally reviewed and evaluated these images and lab results as part of my medical decision-making.   EKG Interpretation None      MDM   Final diagnoses:  Pharyngitis    amoxicillian Hydrocodone An After Visit Summary was printed and given to the patient.    Fransico Meadow, PA-C 03/16/16 [place], PA-C 03/16/16 0110  Orpah Greek, MD 03/16/16 [phone]

## 2016-03-16 NOTE — Discharge Instructions (Signed)

## 2016-03-18 LAB — CULTURE, GROUP A STREP (THRC)

## 2016-03-31 ENCOUNTER — Encounter (HOSPITAL_COMMUNITY): Payer: Self-pay | Admitting: Emergency Medicine

## 2016-03-31 ENCOUNTER — Emergency Department (HOSPITAL_COMMUNITY)
Admission: EM | Admit: 2016-03-31 | Discharge: 2016-03-31 | Disposition: A | Discharge status: Home / Self Care | Payer: Self-pay | Attending: Emergency Medicine | Admitting: Emergency Medicine

## 2016-03-31 DIAGNOSIS — I10 Essential (primary) hypertension: Secondary | ICD-10-CM | POA: Insufficient documentation

## 2016-03-31 DIAGNOSIS — L089 Local infection of the skin and subcutaneous tissue, unspecified: Secondary | ICD-10-CM

## 2016-03-31 DIAGNOSIS — Z79899 Other long term (current) drug therapy: Secondary | ICD-10-CM | POA: Insufficient documentation

## 2016-03-31 DIAGNOSIS — N611 Abscess of the breast and nipple: Secondary | ICD-10-CM | POA: Insufficient documentation

## 2016-03-31 DIAGNOSIS — F17210 Nicotine dependence, cigarettes, uncomplicated: Secondary | ICD-10-CM | POA: Insufficient documentation

## 2016-03-31 DIAGNOSIS — Z7982 Long term (current) use of aspirin: Secondary | ICD-10-CM | POA: Insufficient documentation

## 2016-03-31 DIAGNOSIS — L723 Sebaceous cyst: Secondary | ICD-10-CM

## 2016-03-31 MED ORDER — CEPHALEXIN 500 MG PO CAPS: 500.0000 mg | ORAL_CAPSULE | Freq: Four times a day (QID) | ORAL | Status: DC

## 2016-03-31 MED ORDER — HYDROMORPHONE HCL 1 MG/ML IJ SOLN
2.0000 mg | Freq: Once | INTRAMUSCULAR | Status: AC
  Administered 2016-03-31: 2 mg via INTRAMUSCULAR
  Filled 2016-03-31: qty 2

## 2016-03-31 MED ORDER — SULFAMETHOXAZOLE-TRIMETHOPRIM 800-160 MG PO TABS: 1.0000 | ORAL_TABLET | Freq: Two times a day (BID) | ORAL | Status: AC

## 2016-03-31 MED ORDER — LIDOCAINE HCL (PF) 1 % IJ SOLN
5.0000 mL | Freq: Once | INTRAMUSCULAR | Status: AC
  Administered 2016-03-31: 5 mL via INTRADERMAL
  Filled 2016-03-31: qty 5

## 2016-03-31 MED ORDER — OXYCODONE-ACETAMINOPHEN 5-325 MG PO TABS: 1.0000 | ORAL_TABLET | Freq: Four times a day (QID) | ORAL | Status: DC | PRN

## 2016-03-31 NOTE — Discharge Instructions (Signed)
Keflex and Bactrim as prescribed.  Warm soaks to the area as frequently as possible for the next several days.  Return to the emergency department if you develop increased pain, drainage, fevers, or other new and concerning symptoms.   Sebaceous Cyst Removal Sebaceous cyst removal is a procedure to remove a sac of oily material that forms under your skin (sebaceous cyst). Sebaceous cysts may also be called epidermoid cysts or keratin cysts. Normally, the skin secretes this oily material through a gland or a hair follicle. This type of cyst usually results when a skin gland or hair follicle becomes blocked. You may need this procedure if you have a sebaceous cyst that becomes large, uncomfortable, or infected. LET [facility] CARE PROVIDER KNOW ABOUT:  Any allergies you have.  All medicines you are taking, including vitamins, herbs, eye drops, creams, and over-the-counter medicines.  Previous problems you or members of your family have had with the use of anesthetics.  Any blood disorders you have.  Previous surgeries you have had.  Medical conditions you have. RISKS AND COMPLICATIONS Generally, this is a safe procedure. However, problems may occur, including:  Developing another cyst.  Bleeding.  Infection.  Scarring. BEFORE THE PROCEDURE  Ask your health care provider about:  Changing or stopping your regular medicines. This is especially important if you are taking diabetes medicines or blood thinners.  Taking medicines such as aspirin and ibuprofen. These medicines can thin your blood. Do not take these medicines before your procedure if your health care provider instructs you not to.  If you have an infected cyst, you may have to take antibiotic medicines before or after the cyst removal. Take your antibiotics as directed by your health care provider. Finish all of the medicine even if you start to feel better.  Take a shower on the morning of your procedure. Your  health care provider may ask you to use a germ-killing (antiseptic) soap. PROCEDURE  You will be given a medicine that numbs the area (local anesthetic).  The skin around the cyst will be cleaned with a germ-killing solution (antiseptic).  Your health care provider will make a small surgical incision over the cyst.  The cyst will be separated from the surrounding tissues that are under your skin.  If possible, the cyst will be removed undamaged (intact).  If the cyst bursts (ruptures), it will need to be removed in pieces.  After the cyst is removed, your health care provider will control any bleeding and close the incision with small stitches (sutures). Small incisions may not need sutures, and the bleeding will be controlled by applying direct pressure with gauze.  Your health care provider may apply antibiotic ointment and a light bandage (dressing) over the incision. This procedure may vary among health care providers and hospitals. AFTER THE PROCEDURE  If your cyst ruptured during surgery, you may need to take antibiotic medicine. If you were prescribed an antibiotic medicine, finish all of it even if you start to feel better.   This information is not intended to replace advice given to you by your health care provider. Make sure you discuss any questions you have with your health care provider.   Document Released: 09/23/2000 Document Revised: 10/17/2014 Document Reviewed: 06/11/2014 Elsevier Interactive Patient Education [employer].

## 2016-03-31 NOTE — ED Notes (Signed)
Rt breast pain swelling and redness x 2 weeks worse the last 2 days, occ there is fluid from a spot

## 2016-03-31 NOTE — ED Provider Notes (Signed)
CSN: [id number]     Arrival date & time 03/31/16  [id number] History   First MD Initiated Contact with Patient 03/31/16 1044     Chief Complaint  Patient presents with  . Breast Pain     (Consider location/radiation/quality/duration/timing/severity/associated sxs/prior Treatment) HPI Comments: Patient is a 55 year old female with no significant past medical history. She presents for evaluation of redness, pain, and swelling to the inside of her right breast. This began in the absence of any injury or trauma. She denies any fevers or chills. She does report some drainage from the area.  The history is provided by the patient.    Past Medical History  Diagnosis Date  . Hypertension   . Renal arterial aneurysm [facility])    Past Surgical History  Procedure Laterality Date  . Renal aneurysm Right     right coil-and only has function of left kidney  . Abdominal hysterectomy    . Excision of bone spurs Bilateral     Big toes  . X-stop implantation    . Cholecystectomy N/A 01/28/2015    Procedure: LAPAROSCOPIC CHOLECYSTECTOMY;  Surgeon: Aviva Signs Md, MD;  Location: [facility];  Service: General;  Laterality: N/A;   No family history on file. Social History  Substance Use Topics  . Smoking status: Current Every Day Smoker -- 0.25 packs/day for 20 years    Types: Cigarettes  . Smokeless tobacco: None  . Alcohol Use: No   OB History    No data available     Review of Systems  All other systems reviewed and are negative.     Allergies  Bee venom and Shellfish allergy  Home Medications   Prior to Admission medications   Medication Sig Start Date End Date Taking? Authorizing Provider  amoxicillin (AMOXIL) 500 MG capsule Take 1 capsule (500 mg total) by mouth 3 (three) times daily. 03/16/16   Fransico Meadow, PA-C  aspirin 81 MG tablet Take 81 mg by mouth daily.    Historical Provider, MD  atenolol (TENORMIN) 50 MG tablet Take 50 mg by mouth daily. 07/10/14   Historical Provider, MD   colchicine 0.6 MG tablet Take 0.6 mg by mouth daily.    Historical Provider, MD  diltiazem (CARDIZEM CD) 240 MG 24 hr capsule Take 240 mg by mouth daily. 07/03/14   Historical Provider, MD  doxycycline (VIBRAMYCIN) 100 MG capsule Take 1 capsule (100 mg total) by mouth 2 (two) times daily. One po bid x 7 days 01/30/15   Milton Ferguson, MD  HYDROcodone-acetaminophen (NORCO/VICODIN) 5-325 MG tablet Take 2 tablets by mouth every 4 (four) hours as needed. 03/16/16   Fransico Meadow, PA-C  oxyCODONE-acetaminophen (PERCOCET/ROXICET) 5-325 MG per tablet Take 1-2 tablets by mouth every 6 (six) hours as needed for severe pain. 01/28/15   Aviva Signs, MD  PLAVIX 75 MG tablet Take 75 mg by mouth daily. 07/08/14   Historical Provider, MD  potassium chloride (K-DUR) 10 MEQ tablet Take 10 mEq by mouth daily. 07/03/14   Historical Provider, MD  triamterene-hydrochlorothiazide (MAXZIDE-25) 37.5-25 MG per tablet Take 1 tablet by mouth daily. 07/03/14   Historical Provider, MD  ULORIC 40 MG tablet Take 40 mg by mouth daily. 07/15/14   Historical Provider, MD   BP 129/72 mmHg  Pulse 54  Temp(Src) 98 F (36.7 C) (Oral)  Resp 20  Wt 160 lb (72.576 kg)  SpO2 100% Physical Exam  Constitutional: She is oriented to person, place, and time. She appears well-developed and well-nourished. No  distress.  HENT:  Head: Normocephalic and atraumatic.  Neck: Normal range of motion. Neck supple.  Neurological: She is alert and oriented to person, place, and time.  Skin: Skin is warm and dry. She is not diaphoretic.  There is a 3 cm round, fluctuant area to the inside of the right breast. There is erythema and warmth to the area with some streaking up the breast.  Nursing note and vitals reviewed.   ED Course  Procedures (including critical care time) Labs Review Labs Reviewed - No data to display  Imaging Review No results found. I have personally reviewed and evaluated these images and lab results as part of my medical  decision-making.   EKG Interpretation None     INCISION AND DRAINAGE Performed by: Veryl Speak Consent: Verbal consent obtained. Risks and benefits: risks, benefits and alternatives were discussed Type: abscess  Body area: Right breast  Anesthesia: local infiltration  Incision was made with a scalpel.  Local anesthetic: lidocaine 1 % without epinephrine  Anesthetic total: 2 ml  Complexity: complex Blunt dissection to break up loculations  Drainage: purulent  Drainage amount: Moderate with a large quantity of sebaceous material expressed   Packing material: No packing placed   Patient tolerance: Patient tolerated the procedure well with no immediate complications.     MDM   Final diagnoses:  None    Patient presents with what appears to be a right breast abscess. Incision and drainage was performed and a significant amount of pus was expressed. There is also a large quantity of sebaceous material which was expressed as well. This was foul-smelling. Patient tolerated this procedure well. She will be treated with Keflex and Bactrim and pain medication.    Veryl Speak, MD 03/31/16 1145

## 2016-07-08 ENCOUNTER — Emergency Department (HOSPITAL_COMMUNITY)
Admission: EM | Admit: 2016-07-08 | Discharge: 2016-07-08 | Disposition: A | Discharge status: Home / Self Care | Payer: Self-pay | Attending: Emergency Medicine | Admitting: Emergency Medicine

## 2016-07-08 ENCOUNTER — Encounter (HOSPITAL_COMMUNITY): Payer: Self-pay | Admitting: Emergency Medicine

## 2016-07-08 DIAGNOSIS — Z79899 Other long term (current) drug therapy: Secondary | ICD-10-CM | POA: Insufficient documentation

## 2016-07-08 DIAGNOSIS — I10 Essential (primary) hypertension: Secondary | ICD-10-CM | POA: Insufficient documentation

## 2016-07-08 DIAGNOSIS — M109 Gout, unspecified: Secondary | ICD-10-CM

## 2016-07-08 DIAGNOSIS — F17210 Nicotine dependence, cigarettes, uncomplicated: Secondary | ICD-10-CM | POA: Insufficient documentation

## 2016-07-08 DIAGNOSIS — M10072 Idiopathic gout, left ankle and foot: Secondary | ICD-10-CM | POA: Insufficient documentation

## 2016-07-08 DIAGNOSIS — Z7982 Long term (current) use of aspirin: Secondary | ICD-10-CM | POA: Insufficient documentation

## 2016-07-08 HISTORY — DX: Gout, unspecified: M10.9

## 2016-07-08 MED ORDER — PREDNISONE 50 MG PO TABS
60.0000 mg | ORAL_TABLET | Freq: Once | ORAL | Status: AC
  Administered 2016-07-08: 60 mg via ORAL
  Filled 2016-07-08: qty 1

## 2016-07-08 MED ORDER — PREDNISONE 10 MG PO TABS
ORAL_TABLET | ORAL | 0 refills | Status: AC
Start: 2016-07-09 — End: 2016-07-14

## 2016-07-08 MED ORDER — HYDROCODONE-ACETAMINOPHEN 5-325 MG PO TABS
1.0000 | ORAL_TABLET | Freq: Once | ORAL | Status: AC
  Administered 2016-07-08: 1 via ORAL
  Filled 2016-07-08: qty 1

## 2016-07-08 MED ORDER — HYDROCODONE-ACETAMINOPHEN 5-325 MG PO TABS: 1.0000 | ORAL_TABLET | ORAL | 0 refills | Status: DC | PRN

## 2016-07-08 NOTE — ED Notes (Signed)
Pt alert & oriented x4, stable gait. Patient given discharge instructions, paperwork & prescription(s). Patient informed not to drive, operate any equipment & handel any important documents 4 hours after taking pain medication. Patient  instructed to stop at the registration desk to finish any additional paperwork. Patient  verbalized understanding. Pt left department w/ no further questions. 

## 2016-07-08 NOTE — Discharge Instructions (Signed)
Take your next dose of the prednisone tomorrow evening.  You may take the hydrocodone prescribed for pain relief.  This will make you drowsy - do not drive within 4 hours of taking this medication.

## 2016-07-08 NOTE — ED Notes (Signed)
Pt report left foot pain for the past week. Says can not afford the uloric she was prescribed.

## 2016-07-08 NOTE — ED Triage Notes (Signed)
Pt reports L foot pain that started 1 week ago. Pt has hx of gout.

## 2016-07-09 NOTE — ED Provider Notes (Signed)
[facility] DEPT Provider Note   CSN: [id number] Arrival date & time: 07/08/16  2215     History   Chief Complaint Chief Complaint  Patient presents with  . Foot Pain    HPI Jaclyn Brooks is a 55 y.o. female presenting with a flare of her gout in her left foot which started about one week ago. Her pain is constant, aching and similar to prior gouty flares.  She denies injury to the foot.   She had been on maintenance Uloric in the past but has been unable to afford refills of this medication.  She endorses she also has had an increase in her red meat consumption in the past month.  Her pcp is in [place], from where she moved and has not established local primary care.  She has an appointment with her in 11 days.  She has taken bc powders without relief.  The history is provided by the patient.    Past Medical History:  Diagnosis Date  . Gout   . Hypertension   . 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HCC)     There are no active problems to display for this patient.   Past Surgical History:  Procedure Laterality Date  . ABDOMINAL HYSTERECTOMY    . CHOLECYSTECTOMY N/A 01/28/2015   Procedure: LAPAROSCOPIC CHOLECYSTECTOMY;  Surgeon: Aviva Signs Md, MD;  Location: [facility];  Service: General;  Laterality: N/A;  . excision of bone spurs Bilateral    Big toes  . renal aneurysm Right    right coil-and only has function of left kidney  . X-STOP IMPLANTATION      OB History    Gravida Para Term Preterm AB Living   1 1   1        SAB TAB Ectopic Multiple Live Births                   Home Medications    Prior to Admission medications   Medication Sig Start Date End Date Taking? Authorizing Provider  amoxicillin (AMOXIL) 500 MG capsule Take 1 capsule (500 mg total) by mouth 3 (three) times daily. 03/16/16   Fransico Meadow, PA-C  aspirin 81 MG tablet Take 81 mg by mouth daily.    Historical Provider, MD  atenolol (TENORMIN) 50 MG tablet Take 50 mg by mouth daily. 07/10/14    Historical Provider, MD  cephALEXin (KEFLEX) 500 MG capsule Take 1 capsule (500 mg total) by mouth 4 (four) times daily. 03/31/16   Veryl Speak, MD  colchicine 0.6 MG tablet Take 0.6 mg by mouth daily.    Historical Provider, MD  diltiazem (CARDIZEM CD) 240 MG 24 hr capsule Take 240 mg by mouth daily. 07/03/14   Historical Provider, MD  doxycycline (VIBRAMYCIN) 100 MG capsule Take 1 capsule (100 mg total) by mouth 2 (two) times daily. One po bid x 7 days 01/30/15   Milton Ferguson, MD  HYDROcodone-acetaminophen (NORCO/VICODIN) 5-325 MG tablet Take 1 tablet by mouth every 4 (four) hours as needed. 07/08/16   Evalee Jefferson, PA-C  oxyCODONE-acetaminophen (PERCOCET) 5-325 MG tablet Take 1-2 tablets by mouth every 6 (six) hours as needed. 03/31/16   Veryl Speak, MD  PLAVIX 75 MG tablet Take 75 mg by mouth daily. 07/08/14   Historical Provider, MD  potassium chloride (K-DUR) 10 MEQ tablet Take 10 mEq by mouth daily. 07/03/14   Historical Provider, MD  predniSONE (DELTASONE) 10 MG tablet 6, 5, 4, 3, 2 then 1 tablet by mouth daily for  6 days total. 07/09/16 07/14/16  Evalee Jefferson, PA-C  triamterene-hydrochlorothiazide (MAXZIDE-25) 37.5-25 MG per tablet Take 1 tablet by mouth daily. 07/03/14   Historical Provider, MD  ULORIC 40 MG tablet Take 40 mg by mouth daily. 07/15/14   Historical Provider, MD    Family History Family History  Problem Relation Age of Onset  . Hypertension Mother   . Hypertension Father     Social History Social History  Substance Use Topics  . Smoking status: Current Every Day Smoker    Packs/day: 0.25    Years: 20.00    Types: Cigarettes  . Smokeless tobacco: Never Used  . Alcohol use No     Allergies   Bee venom and Shellfish allergy   Review of Systems Review of Systems  Constitutional: Negative for chills and fever.  Musculoskeletal: Positive for arthralgias and joint swelling. Negative for myalgias.  Neurological: Negative for weakness and numbness.     Physical  Exam Updated Vital Signs BP 133/65 (BP Location: Right Arm)   Pulse (!) 56   Temp 98.5 F (36.9 C) (Oral)   Resp 18   Ht 5\' 6"  (1.676 m)   Wt 72.6 kg   SpO2 100%   BMI 25.82 kg/m   Physical Exam  Constitutional: She appears well-developed and well-nourished.  HENT:  Head: Atraumatic.  Neck: Normal range of motion.  Cardiovascular:  Pulses equal bilaterally  Musculoskeletal: She exhibits edema and tenderness.       Left foot: There is tenderness and swelling. There is normal capillary refill, no crepitus and no deformity.  Mild warmth and ttp left dorsal lateral ankle and lateral foot.  She has ROM of ankle and toes with discomfort.  No erythema or red streaking.  No skin lesions, abrasions/punctures or rash.  Well healed surgical  incision over the great toe  joint.  Neurological: She is alert. She has normal strength. She displays normal reflexes. No sensory deficit.  Skin: Skin is warm and dry.  Psychiatric: She has a normal mood and affect.     ED Treatments / Results  Labs (all labs ordered are listed, but only abnormal results are displayed) Labs Reviewed - No data to display  EKG  EKG Interpretation None       Radiology No results found.  Procedures Procedures (including critical care time)  Medications Ordered in ED Medications  HYDROcodone-acetaminophen (NORCO/VICODIN) 5-325 MG per tablet 1 tablet (1 tablet Oral Given 07/08/16 2316)  predniSONE (DELTASONE) tablet 60 mg (60 mg Oral Given 07/08/16 2316)     Initial Impression / Assessment and Plan / ED Course  I have reviewed the triage vital signs and the nursing notes.  Pertinent labs & imaging results that were available during my care of the patient were reviewed by me and considered in my medical decision making (see chart for details).  Clinical Course    Exam and hx c/w gouty flare.  Pt denies trauma, endorses sx similar to prior gouty flares.  She was placed on prednisone taper, hydrocodone,  advised elevation, warm compresses. F/u with pcp as planned. Advised return here sooner for any worsened sx.    Final Clinical Impressions(s) / ED Diagnoses   Final diagnoses:  Acute gout of left foot, unspecified cause    New Prescriptions Discharge Medication List as of 07/08/2016 11:14 PM    START taking these medications   Details  predniSONE (DELTASONE) 10 MG tablet 6, 5, 4, 3, 2 then 1 tablet by mouth daily for 6  days total., Print         Evalee Jefferson, PA-C 07/09/16 0105    Margette Fast, MD 07/09/16 [id number]

## 2017-04-17 ENCOUNTER — Emergency Department (HOSPITAL_COMMUNITY): Payer: Self-pay

## 2017-04-17 ENCOUNTER — Encounter (HOSPITAL_COMMUNITY): Payer: Self-pay | Admitting: *Deleted

## 2017-04-17 ENCOUNTER — Emergency Department (HOSPITAL_COMMUNITY)
Admission: EM | Admit: 2017-04-17 | Discharge: 2017-04-17 | Disposition: A | Discharge status: Home Health | Payer: Self-pay | Attending: Emergency Medicine | Admitting: Emergency Medicine

## 2017-04-17 DIAGNOSIS — Z7902 Long term (current) use of antithrombotics/antiplatelets: Secondary | ICD-10-CM | POA: Insufficient documentation

## 2017-04-17 DIAGNOSIS — F17210 Nicotine dependence, cigarettes, uncomplicated: Secondary | ICD-10-CM | POA: Insufficient documentation

## 2017-04-17 DIAGNOSIS — M7021 Olecranon bursitis, right elbow: Secondary | ICD-10-CM | POA: Insufficient documentation

## 2017-04-17 DIAGNOSIS — Y939 Activity, unspecified: Secondary | ICD-10-CM | POA: Insufficient documentation

## 2017-04-17 DIAGNOSIS — Z79899 Other long term (current) drug therapy: Secondary | ICD-10-CM | POA: Insufficient documentation

## 2017-04-17 DIAGNOSIS — I10 Essential (primary) hypertension: Secondary | ICD-10-CM | POA: Insufficient documentation

## 2017-04-17 DIAGNOSIS — Z792 Long term (current) use of antibiotics: Secondary | ICD-10-CM | POA: Insufficient documentation

## 2017-04-17 DIAGNOSIS — Z7982 Long term (current) use of aspirin: Secondary | ICD-10-CM | POA: Insufficient documentation

## 2017-04-17 LAB — SYNOVIAL CELL COUNT + DIFF, W/ CRYSTALS
Crystals, Fluid: NONE SEEN
Eosinophils-Synovial: 0 % (ref 0–1)
Lymphocytes-Synovial Fld: 0 % (ref 0–20)
Monocyte-Macrophage-Synovial Fluid: 22 % — ABNORMAL LOW (ref 50–90)
Neutrophil, Synovial: 78 % — ABNORMAL HIGH (ref 0–25)
WBC, Synovial: 3860 /mm3 — ABNORMAL HIGH (ref 0–200)

## 2017-04-17 LAB — GRAM STAIN

## 2017-04-17 MED ORDER — LIDOCAINE HCL (PF) 1 % IJ SOLN
5.0000 mL | Freq: Once | INTRAMUSCULAR | Status: AC
  Administered 2017-04-17: 5 mL

## 2017-04-17 MED ORDER — POVIDONE-IODINE 10 % EX SOLN
CUTANEOUS | Status: AC
  Filled 2017-04-17: qty 118

## 2017-04-17 MED ORDER — IBUPROFEN 400 MG PO TABS
400.0000 mg | ORAL_TABLET | Freq: Once | ORAL | Status: AC
  Administered 2017-04-17: 400 mg via ORAL
  Filled 2017-04-17: qty 1

## 2017-04-17 MED ORDER — NAPROXEN 500 MG PO TABS: 500.0000 mg | ORAL_TABLET | Freq: Two times a day (BID) | ORAL | 0 refills | Status: DC

## 2017-04-17 MED ORDER — LIDOCAINE HCL (PF) 1 % IJ SOLN
INTRAMUSCULAR | Status: AC
  Administered 2017-04-17: 5 mL
  Filled 2017-04-17: qty 5

## 2017-04-17 MED ORDER — HYDROCODONE-ACETAMINOPHEN 5-325 MG PO TABS
1.0000 | ORAL_TABLET | Freq: Once | ORAL | Status: AC
  Administered 2017-04-17: 1 via ORAL
  Filled 2017-04-17: qty 1

## 2017-04-17 MED ORDER — PREDNISONE 10 MG (21) PO TBPK: ORAL_TABLET | Freq: Every day | ORAL | 0 refills | Status: DC

## 2017-04-17 NOTE — Discharge Instructions (Signed)
Take the prednisone as prescribed. You will be called if you need antibiotics. Follow up with Dr. Aline Brochure. Return to the ED if you develop new or worsening symptoms.

## 2017-04-17 NOTE — ED Notes (Signed)
Patient transported to CT 

## 2017-04-17 NOTE — ED Triage Notes (Signed)
Pt c/o right elbow pain that started yesterday, unsure of any injury.,

## 2017-04-17 NOTE — ED Provider Notes (Addendum)
[facility] DEPT Provider Note   CSN: [id number] Arrival date & time: 04/17/17  0159     History   Chief Complaint Chief Complaint  Patient presents with  . Arm Pain    HPI Jaclyn Brooks is a 56 y.o. female.  Patient with history of hypertension and gout presenting with right elbow pain for the past 2 days. Denies any trauma. States increased swelling to her right elbow. Pain with range of motion. Denies fever, chills, nausea or vomiting. No focal weakness, numbness or tingling. She's been taking ibuprofen without relief. She is not certain whether this is similar to previous gout flares. She's never had gout in her elbow. Denies any other joint involvement.   The history is provided by the patient.  Arm Pain  Pertinent negatives include no chest pain, no abdominal pain and no shortness of breath.    Past Medical History:  Diagnosis Date  . Gout   . Hypertension   . 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HCC)     There are no active problems to display for this patient.   Past Surgical History:  Procedure Laterality Date  . ABDOMINAL HYSTERECTOMY    . CHOLECYSTECTOMY N/A 01/28/2015   Procedure: LAPAROSCOPIC CHOLECYSTECTOMY;  Surgeon: Aviva Signs Md, MD;  Location: [facility];  Service: General;  Laterality: N/A;  . excision of bone spurs Bilateral    Big toes  . renal aneurysm Right    right coil-and only has function of left kidney  . X-STOP IMPLANTATION      OB History    Gravida Para Term Preterm AB Living   1 1   1        SAB TAB Ectopic Multiple Live Births                   Home Medications    Prior to Admission medications   Medication Sig Start Date End Date Taking? Authorizing Provider  amoxicillin (AMOXIL) 500 MG capsule Take 1 capsule (500 mg total) by mouth 3 (three) times daily. 03/16/16   Fransico Meadow, PA-C  aspirin 81 MG tablet Take 81 mg by mouth daily.    [provider]  atenolol (TENORMIN) 50 MG tablet Take 50 mg by mouth daily. 07/10/14    [provider]  cephALEXin (KEFLEX) 500 MG capsule Take 1 capsule (500 mg total) by mouth 4 (four) times daily. 03/31/16   Veryl Speak, MD  colchicine 0.6 MG tablet Take 0.6 mg by mouth daily.    [provider]  diltiazem (CARDIZEM CD) 240 MG 24 hr capsule Take 240 mg by mouth daily. 07/03/14   [provider]  doxycycline (VIBRAMYCIN) 100 MG capsule Take 1 capsule (100 mg total) by mouth 2 (two) times daily. One po bid x 7 days 01/30/15   Milton Ferguson, MD  HYDROcodone-acetaminophen (NORCO/VICODIN) 5-325 MG tablet Take 1 tablet by mouth every 4 (four) hours as needed. 07/08/16   Evalee Jefferson, PA-C  oxyCODONE-acetaminophen (PERCOCET) 5-325 MG tablet Take 1-2 tablets by mouth every 6 (six) hours as needed. 03/31/16   Veryl Speak, MD  PLAVIX 75 MG tablet Take 75 mg by mouth daily. 07/08/14   [provider]  potassium chloride (K-DUR) 10 MEQ tablet Take 10 mEq by mouth daily. 07/03/14   [provider]  triamterene-hydrochlorothiazide (MAXZIDE-25) 37.5-25 MG per tablet Take 1 tablet by mouth daily. 07/03/14   [provider]  ULORIC 40 MG tablet Take 40 mg by mouth daily. 07/15/14   [provider]    Family History Family History  Problem Relation Age of Onset  . Hypertension Mother   . Hypertension Father     Social History Social History  Substance Use Topics  . Smoking status: Current Every Day Smoker    Packs/day: 0.25    Years: 20.00    Types: Cigarettes  . Smokeless tobacco: Never Used  . Alcohol use No     Allergies   Bee venom and Shellfish allergy   Review of Systems Review of Systems  Constitutional: Negative for activity change, appetite change and fever.  HENT: Negative for congestion and rhinorrhea.   Respiratory: Negative for cough, chest tightness and shortness of breath.   Cardiovascular: Negative for chest pain.  Gastrointestinal: Negative for abdominal pain, nausea and vomiting.  Genitourinary:  Negative for dysuria and hematuria.  Musculoskeletal: Positive for arthralgias, joint swelling and myalgias.  Skin: Negative for rash.  Neurological: Negative for dizziness, weakness and light-headedness.    all other systems are negative except as noted in the HPI and PMH.    Physical Exam Updated Vital Signs BP 140/60   Pulse 60   Temp 98.3 F (36.8 C) (Oral)   Resp 18   Ht 5\' 6"  (1.676 m)   Wt 80.7 kg (178 lb)   SpO2 98%   BMI 28.73 kg/m   Physical Exam  Constitutional: She is oriented to person, place, and time. She appears well-developed and well-nourished. No distress.  HENT:  Head: Normocephalic and atraumatic.  Mouth/Throat: Oropharynx is clear and moist. No oropharyngeal exudate.  Eyes: Conjunctivae and EOM are normal. Pupils are equal, round, and reactive to light.  Neck: Normal range of motion. Neck supple.  No meningismus.  Cardiovascular: Normal rate, regular rhythm, normal heart sounds and intact distal pulses.   No murmur heard. Pulmonary/Chest: Effort normal and breath sounds normal. No respiratory distress.  Abdominal: Soft. There is no tenderness. There is no rebound and no guarding.  Musculoskeletal: Normal range of motion. She exhibits tenderness. She exhibits no edema.  Patient with large edematous bursa overlying right olecranon. It is tender to palpation. There is no warmth or erythema. Full range of motion of elbow joint without pain. Intact radial pulse.  Neurological: She is alert and oriented to person, place, and time. No cranial nerve deficit. She exhibits normal muscle tone. Coordination normal.   5/5 strength throughout. CN 2-12 intact.Equal grip strength.   Skin: Skin is warm.  Psychiatric: She has a normal mood and affect. Her behavior is normal.  Nursing note and vitals reviewed.    ED Treatments / Results  Labs (all labs ordered are listed, but only abnormal results are displayed) Labs Reviewed  SYNOVIAL CELL COUNT + DIFF, W/ CRYSTALS  - Abnormal; Notable for the following:       Result Value   Color, Synovial YELLOW (*)    Appearance-Synovial CLOUDY (*)    WBC, Synovial 3,860 (*)    Neutrophil, Synovial 78 (*)    Monocyte-Macrophage-Synovial Fluid 22 (*)    All other components within normal limits  GRAM STAIN  CULTURE, BODY FLUID-BOTTLE    EKG  EKG Interpretation None       Radiology Dg Elbow Complete Right  Result Date: 04/17/2017 CLINICAL DATA:  Right elbow pain EXAM: RIGHT ELBOW - COMPLETE 3+ VIEW COMPARISON:  None. FINDINGS: There is no evidence of fracture, dislocation, or joint effusion. There is no evidence of arthropathy or other focal bone abnormality. Soft tissues are unremarkable. IMPRESSION: Negative.  Electronically Signed   By: Donavan Foil M.D.   On: 04/17/2017 03:54    Procedures Aspiration of blood/fluid Date/Time: 04/17/2017 4:59 AM Performed by: Ezequiel Essex Authorized by: Ezequiel Essex  Consent: Verbal consent obtained. Risks and benefits: risks, benefits and alternatives were discussed Consent given by: patient Patient understanding: patient states understanding of the procedure being performed Patient identity confirmed: provided demographic data and verbally with patient Time out: Immediately prior to procedure a "time out" was called to verify the correct patient, procedure, equipment, support staff and site/side marked as required. Preparation: Patient was prepped and draped in the usual sterile fashion. Local anesthesia used: yes Anesthesia: local infiltration  Anesthesia: Local anesthesia used: yes Local Anesthetic: lidocaine 1% without epinephrine Anesthetic total: 4 mL  Sedation: Patient sedated: no Patient tolerance: Patient tolerated the procedure well with no immediate complications Comments: Needle aspiration of R olecranon bursa under sterile conditions.    (including critical care time)  Medications Ordered in ED Medications  ibuprofen (ADVIL,MOTRIN)  tablet 400 mg (400 mg Oral Given 04/17/17 0321)  HYDROcodone-acetaminophen (NORCO/VICODIN) 5-325 MG per tablet 1 tablet (1 tablet Oral Given 04/17/17 0322)     Initial Impression / Assessment and Plan / ED Course  I have reviewed the triage vital signs and the nursing notes.  Pertinent labs & imaging results that were available during my care of the patient were reviewed by me and considered in my medical decision making (see chart for details).     Patient with history of gout presents with 2 days of atraumatic right elbow pain and swelling. There is no erythema or warmth. No fever.  Low suspicion for septic joint. Patient appears to have bursitis versus gout tophus.  Patient agrees to bursa aspiration which was performed under sterile conditions.  Patient anxious to leave. She does not want to wait for the results. Her phone number is [phone]  Patient will be treated for bursitis. Low suspicion for septic bursitis.  Bursa aspirate has negative gram stain.  WBC 3860.  Likely not consistent with septic bursitis. Culture pending. Follow up with orthopedics. Return precautions discussed.  Final Clinical Impressions(s) / ED Diagnoses   Final diagnoses:  Olecranon bursitis of right elbow    New Prescriptions New Prescriptions   No medications on file     Ezequiel Essex, MD 04/17/17 0174    Ezequiel Essex, MD 05/03/17 1020

## 2017-04-22 LAB — CULTURE, BODY FLUID W GRAM STAIN -BOTTLE: Culture: NO GROWTH

## 2017-10-29 ENCOUNTER — Other Ambulatory Visit: Payer: Self-pay

## 2017-10-29 ENCOUNTER — Emergency Department (HOSPITAL_COMMUNITY): Payer: Self-pay

## 2017-10-29 ENCOUNTER — Encounter (HOSPITAL_COMMUNITY): Payer: Self-pay | Admitting: Emergency Medicine

## 2017-10-29 ENCOUNTER — Emergency Department (HOSPITAL_COMMUNITY)
Admission: EM | Admit: 2017-10-29 | Discharge: 2017-10-29 | Disposition: A | Discharge status: Home / Self Care | Payer: Self-pay | Attending: Emergency Medicine | Admitting: Emergency Medicine

## 2017-10-29 DIAGNOSIS — I10 Essential (primary) hypertension: Secondary | ICD-10-CM | POA: Insufficient documentation

## 2017-10-29 DIAGNOSIS — F17210 Nicotine dependence, cigarettes, uncomplicated: Secondary | ICD-10-CM | POA: Insufficient documentation

## 2017-10-29 DIAGNOSIS — J209 Acute bronchitis, unspecified: Secondary | ICD-10-CM | POA: Insufficient documentation

## 2017-10-29 MED ORDER — AZITHROMYCIN 250 MG PO TABS: 250.0000 mg | ORAL_TABLET | Freq: Every day | ORAL | 0 refills | Status: DC

## 2017-10-29 MED ORDER — PREDNISONE 20 MG PO TABS: ORAL_TABLET | ORAL | 0 refills | Status: DC

## 2017-10-29 MED ORDER — AZITHROMYCIN 250 MG PO TABS
500.0000 mg | ORAL_TABLET | Freq: Once | ORAL | Status: AC
  Administered 2017-10-29: 500 mg via ORAL
  Filled 2017-10-29: qty 2

## 2017-10-29 MED ORDER — ALBUTEROL SULFATE HFA 108 (90 BASE) MCG/ACT IN AERS
2.0000 | INHALATION_SPRAY | Freq: Once | RESPIRATORY_TRACT | Status: AC
  Administered 2017-10-29: 2 via RESPIRATORY_TRACT
  Filled 2017-10-29: qty 6.7

## 2017-10-29 MED ORDER — PREDNISONE 50 MG PO TABS
60.0000 mg | ORAL_TABLET | Freq: Once | ORAL | Status: AC
  Administered 2017-10-29: 60 mg via ORAL
  Filled 2017-10-29: qty 1

## 2017-10-29 NOTE — ED Provider Notes (Signed)
Emergency Department Provider Note   I have reviewed the triage vital signs and the nursing notes.   HISTORY  Chief Complaint Cough   HPI Jaclyn Brooks is a 57 y.o. female smoker who presents with over a week of progressively worsening productive cough with decreased energy, sob and subjective fevers. Similar to previous episodes of bronchitis. Also has sore throat, ha and ear pain during this same time period. Not seen anyone or tried anything for symptoms.   No other associated or modifying symptoms.    Past Medical History:  Diagnosis Date  . Gout   . Hypertension   . 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HCC)     There are no active problems to display for this patient.   Past Surgical History:  Procedure Laterality Date  . ABDOMINAL HYSTERECTOMY    . CHOLECYSTECTOMY N/A 01/28/2015   Procedure: LAPAROSCOPIC CHOLECYSTECTOMY;  Surgeon: Aviva Signs Md, MD;  Location: [facility];  Service: General;  Laterality: N/A;  . excision of bone spurs Bilateral    Big toes  . renal aneurysm Right    right coil-and only has function of left kidney  . X-STOP IMPLANTATION      Current Outpatient Rx  . Order #: [id number] Class: Print  . Order #: [id number] Class: Historical Med  . Order #: [id number] Class: Historical Med  . Order #: [id number] Class: Print  . Order #: [id number] Class: Print  . Order #: [id number] Class: Historical Med  . Order #: [id number] Class: Historical Med  . Order #: [id number] Class: Print  . Order #: [id number] Class: Print  . Order #: [id number] Class: Print  . Order #: [id number] Class: Historical Med  . Order #: [id number] Class: Historical Med  . Order #: [id number] Class: Print  . Order #: [id number] Class: Historical Med  . Order #: [id number] Class: Historical Med    Allergies Bee venom and Shellfish allergy  Family History  Problem Relation Age of Onset  . Hypertension Mother   . Hypertension Father     Social History Social History   Tobacco Use  . Smoking status:  Current Every Day Smoker    Packs/day: 0.25    Years: 20.00    Pack years: 5.00    Types: Cigarettes  . Smokeless tobacco: Never Used  Substance Use Topics  . Alcohol use: No  . Drug use: No    Review of Systems  All other systems negative except as documented in the HPI. All pertinent positives and negatives as reviewed in the HPI. ____________________________________________   PHYSICAL EXAM:  VITAL SIGNS: ED Triage Vitals  Enc Vitals Group     BP 10/29/17 1148 (!) 137/94     Pulse Rate 10/29/17 1148 (!) 58     Resp 10/29/17 1148 18     Temp 10/29/17 1148 98.9 F (37.2 C)     Temp Source 10/29/17 1148 Oral     SpO2 10/29/17 1148 100 %     Weight 10/29/17 1149 170 lb (77.1 kg)     Height 10/29/17 1149 5\' 6"  (1.676 m)    Constitutional: Alert and oriented. Well appearing and in no acute distress. Eyes: Conjunctivae are normal. PERRL. EOMI. Head: Atraumatic. Nose: No congestion/rhinnorhea. Mouth/Throat: Mucous membranes are moist.  Oropharynx non-erythematous. Neck: No stridor.  No meningeal signs.   Cardiovascular: Normal rate, regular rhythm. Good peripheral circulation. Grossly normal heart sounds.   Respiratory: Normal respiratory effort.  No retractions. Lungs with diffuse rhonchi and wheezing but good air movement. Gastrointestinal: Soft and nontender. No distention.  Musculoskeletal: No lower extremity tenderness  nor edema. No gross deformities of extremities. Neurologic:  Normal speech and language. No gross focal neurologic deficits are appreciated.  Skin:  Skin is warm, dry and intact. No rash noted.   ____________________________________________   LABS (all labs ordered are listed, but only abnormal results are displayed)  Labs Reviewed  CBC WITH DIFFERENTIAL/PLATELET  COMPREHENSIVE METABOLIC PANEL  ____________________________________________  RADIOLOGY  Dg Chest 2 View  Result Date: 10/29/2017 CLINICAL DATA:  Nonproductive cough with wheezing,  shortness of breath and low grade fever for 1 week. Current smoker. EXAM: CHEST  2 VIEW COMPARISON:  None. FINDINGS: The heart size and mediastinal contours are normal. The lungs are clear aside from linear density at the left lung base on the frontal examination, likely scarring or atelectasis. There is no pleural effusion or pneumothorax. The bones appear unremarkable. Metallic density overlaps the upper lumbar spine on the lateral view, not seen on the frontal examination. These may represent embolization coils as correlated with report from lumbar radiographs 01/27/2016-unavailable. IMPRESSION: No active cardiopulmonary process. Electronically Signed   By: Richardean Sale M.D.   On: 10/29/2017 12:21    ____________________________________________   INITIAL IMPRESSION / ASSESSMENT AND PLAN / ED COURSE  Clear bronchitis however has been get worse for a week so we will treat with antibodies.  Very well could be influenza however no high fevers or sick contacts to suggest the same.  Either way she is out of the window for treatment for influenza.  We will start antibiotics, steroid burst and give her an inhaler.   Pertinent labs & imaging results that were available during my care of the patient were reviewed by me and considered in my medical decision making (see chart for details).  ____________________________________________  FINAL CLINICAL IMPRESSION(S) / ED DIAGNOSES  Final diagnoses:  Acute bronchitis, unspecified organism     MEDICATIONS GIVEN DURING THIS VISIT:  Medications  azithromycin (ZITHROMAX) tablet 500 mg (not administered)  albuterol (PROVENTIL HFA;VENTOLIN HFA) 108 (90 Base) MCG/ACT inhaler 2 puff (not administered)  predniSONE (DELTASONE) tablet 60 mg (not administered)     NEW OUTPATIENT MEDICATIONS STARTED DURING THIS VISIT:  New Prescriptions   AZITHROMYCIN (ZITHROMAX) 250 MG TABLET    Take 1 tablet (250 mg total) by mouth daily. Take 1 every day until  finished.   PREDNISONE (DELTASONE) 20 MG TABLET    2 tabs po daily x 4 days    Note:  This note was prepared with assistance of Dragon voice recognition software. Occasional wrong-word or sound-a-like substitutions may have occurred due to the inherent limitations of voice recognition software.   Merrily Pew, MD 10/29/17 1310

## 2017-10-29 NOTE — ED Triage Notes (Signed)
Patient c/o cough with thick green sputum, sore throat, headache, and left ear pain x1 week. Per patient fatigue, diarrhea, and nausea as well. Denies any vomiting or fevers.

## 2017-11-04 ENCOUNTER — Emergency Department (HOSPITAL_COMMUNITY)
Admission: EM | Admit: 2017-11-04 | Discharge: 2017-11-04 | Disposition: A | Discharge status: Home / Self Care | Payer: Self-pay | Attending: Emergency Medicine | Admitting: Emergency Medicine

## 2017-11-04 ENCOUNTER — Encounter (HOSPITAL_COMMUNITY): Payer: Self-pay | Admitting: *Deleted

## 2017-11-04 ENCOUNTER — Other Ambulatory Visit: Payer: Self-pay

## 2017-11-04 DIAGNOSIS — F17210 Nicotine dependence, cigarettes, uncomplicated: Secondary | ICD-10-CM | POA: Insufficient documentation

## 2017-11-04 DIAGNOSIS — I10 Essential (primary) hypertension: Secondary | ICD-10-CM | POA: Insufficient documentation

## 2017-11-04 DIAGNOSIS — Z7982 Long term (current) use of aspirin: Secondary | ICD-10-CM | POA: Insufficient documentation

## 2017-11-04 DIAGNOSIS — R059 Cough, unspecified: Secondary | ICD-10-CM

## 2017-11-04 DIAGNOSIS — Z79899 Other long term (current) drug therapy: Secondary | ICD-10-CM | POA: Insufficient documentation

## 2017-11-04 DIAGNOSIS — R05 Cough: Secondary | ICD-10-CM | POA: Diagnosis not present

## 2017-11-04 DIAGNOSIS — R0981 Nasal congestion: Secondary | ICD-10-CM | POA: Insufficient documentation

## 2017-11-04 MED ORDER — BENZONATATE 200 MG PO CAPS: 200.0000 mg | ORAL_CAPSULE | Freq: Three times a day (TID) | ORAL | 0 refills | Status: DC | PRN

## 2017-11-04 MED ORDER — BENZONATATE 100 MG PO CAPS
200.0000 mg | ORAL_CAPSULE | Freq: Once | ORAL | Status: AC
  Administered 2017-11-04: 200 mg via ORAL
  Filled 2017-11-04: qty 2

## 2017-11-04 NOTE — Discharge Instructions (Signed)
Continue using your albuterol inhaler, 1-2 puffs every 4-6 hrs as needed.  Drink plenty of fluids.  Stop the OTC cough medications.  Follow-up with your doctor for recheck

## 2017-11-04 NOTE — ED Notes (Signed)
Here previously for cough and URI sx  Returns due to out of antibiotic and still coughing- pt has wet cough- has not smoked in several days And reports weak from coughing

## 2017-11-04 NOTE — ED Triage Notes (Addendum)
Pt with cough productive for 2 weeks. Pt denies emesis.

## 2017-11-04 NOTE — ED Provider Notes (Signed)
[facility] EMERGENCY DEPARTMENT Provider Note   CSN: [id number] Arrival date & time: 11/04/17  1210     History   Chief Complaint Chief Complaint  Patient presents with  . Cough    HPI Jaclyn Brooks is a 57 y.o. female.  HPI  Jaclyn Brooks is a 57 y.o. female who presents to the Emergency Department complaining of persistent cough for 2 weeks.  She was seen here nearly one week ago for same.  Cough occasionally productive.  Associated with nasal congestion and runny nose. States the cough slightly improved while taking the prescribed medications, but never resolved.  She denies fever, chest pain, shortness of breath, peripheral edema, hemoptysis.  She has also taken OTC cough medications without relief.    Past Medical History:  Diagnosis Date  . Gout   . Hypertension   . 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HCC)     There are no active problems to display for this patient.   Past Surgical History:  Procedure Laterality Date  . ABDOMINAL HYSTERECTOMY    . CHOLECYSTECTOMY N/A 01/28/2015   Procedure: LAPAROSCOPIC CHOLECYSTECTOMY;  Surgeon: Aviva Signs Md, MD;  Location: [facility]S;  Service: General;  Laterality: N/A;  . excision of bone spurs Bilateral    Big toes  . renal aneurysm Right    right coil-and only has function of left kidney  . X-STOP IMPLANTATION      OB History    Gravida Para Term Preterm AB Living   1 1   1        SAB TAB Ectopic Multiple Live Births                   Home Medications    Prior to Admission medications   Medication Sig Start Date End Date Taking? Authorizing Provider  amoxicillin (AMOXIL) 500 MG capsule Take 1 capsule (500 mg total) by mouth 3 (three) times daily. 03/16/16   Fransico Meadow, PA-C  aspirin 81 MG tablet Take 81 mg by mouth daily.    [provider]  atenolol (TENORMIN) 50 MG tablet Take 50 mg by mouth daily. 07/10/14   [provider]  azithromycin (ZITHROMAX) 250 MG tablet Take 1 tablet (250 mg total) by mouth  daily. Take 1 every day until finished. 10/29/17   Mesner, Corene Cornea, MD  benzonatate (TESSALON) 200 MG capsule Take 1 capsule (200 mg total) by mouth 3 (three) times daily as needed for cough. Swallow whole, do not chew 11/04/17   Kyndahl Jablon, PA-C  cephALEXin (KEFLEX) 500 MG capsule Take 1 capsule (500 mg total) by mouth 4 (four) times daily. 03/31/16   Veryl Speak, MD  colchicine 0.6 MG tablet Take 0.6 mg by mouth daily.    [provider]  diltiazem (CARDIZEM CD) 240 MG 24 hr capsule Take 240 mg by mouth daily. 07/03/14   [provider]  doxycycline (VIBRAMYCIN) 100 MG capsule Take 1 capsule (100 mg total) by mouth 2 (two) times daily. One po bid x 7 days 01/30/15   Milton Ferguson, MD  naproxen (NAPROSYN) 500 MG tablet Take 1 tablet (500 mg total) by mouth 2 (two) times daily. 04/17/17   Rancour, Annie Main, MD  oxyCODONE-acetaminophen (PERCOCET) 5-325 MG tablet Take 1-2 tablets by mouth every 6 (six) hours as needed. 03/31/16   Veryl Speak, MD  PLAVIX 75 MG tablet Take 75 mg by mouth daily. 07/08/14   [provider]  potassium chloride (K-DUR) 10 MEQ tablet Take 10 mEq by  mouth daily. 07/03/14   [provider]  predniSONE (DELTASONE) 20 MG tablet 2 tabs po daily x 4 days 10/29/17   Mesner, Corene Cornea, MD  triamterene-hydrochlorothiazide (MAXZIDE-25) 37.5-25 MG per tablet Take 1 tablet by mouth daily. 07/03/14   [provider]  ULORIC 40 MG tablet Take 40 mg by mouth daily. 07/15/14   [provider]    Family History Family History  Problem Relation Age of Onset  . Hypertension Mother   . Hypertension Father     Social History Social History   Tobacco Use  . Smoking status: Current Every Day Smoker    Packs/day: 0.25    Years: 20.00    Pack years: 5.00    Types: Cigarettes  . Smokeless tobacco: Never Used  Substance Use Topics  . Alcohol use: No  . Drug use: No     Allergies   Bee venom and Shellfish allergy   Review of  Systems Review of Systems  Constitutional: Negative for appetite change, chills and fever.  HENT: Positive for congestion and rhinorrhea. Negative for sore throat and trouble swallowing.   Respiratory: Positive for cough. Negative for chest tightness, shortness of breath and wheezing.   Cardiovascular: Negative for chest pain and leg swelling.  Gastrointestinal: Negative for abdominal pain, nausea and vomiting.  Genitourinary: Negative for dysuria.  Musculoskeletal: Negative for arthralgias.  Skin: Negative for rash.  Neurological: Negative for dizziness, weakness and numbness.  Hematological: Negative for adenopathy.  All other systems reviewed and are negative.    Physical Exam Updated Vital Signs BP 120/84 (BP Location: Right Arm)   Pulse 63   Temp 99.1 F (37.3 C) (Oral)   Resp 16   Ht 5\' 6"  (1.676 m)   Wt 77.1 kg (170 lb)   SpO2 96%   BMI 27.44 kg/m   Physical Exam  Constitutional: She is oriented to person, place, and time. She appears well-developed and well-nourished. No distress.  HENT:  Head: Normocephalic and atraumatic.  Right Ear: Tympanic membrane and ear canal normal.  Left Ear: Tympanic membrane and ear canal normal.  Mouth/Throat: Uvula is midline, oropharynx is clear and moist and mucous membranes are normal. No oropharyngeal exudate.  Eyes: EOM are normal. Pupils are equal, round, and reactive to light.  Neck: Normal range of motion, full passive range of motion without pain and phonation normal. Neck supple.  Cardiovascular: Normal rate, regular rhythm and intact distal pulses.  No murmur heard. Pulmonary/Chest: Effort normal. No stridor. No respiratory distress. She has no wheezes. She has no rales. She exhibits no tenderness.  Abdominal: Soft. She exhibits no distension. There is no tenderness.  Musculoskeletal: Normal range of motion. She exhibits no edema.  Lymphadenopathy:    She has no cervical adenopathy.  Neurological: She is alert and oriented  to person, place, and time. She exhibits normal muscle tone. Coordination normal.  Skin: Skin is warm and dry. No rash noted.  Psychiatric: She has a normal mood and affect.  Nursing note and vitals reviewed.    ED Treatments / Results  Labs (all labs ordered are listed, but only abnormal results are displayed) Labs Reviewed - No data to display  EKG  EKG Interpretation None       Radiology No results found.  Procedures Procedures (including critical care time)  Medications Ordered in ED Medications  benzonatate (TESSALON) capsule 200 mg (200 mg Oral Given 11/04/17 1239)     Initial Impression / Assessment and Plan / ED Course  I have reviewed the triage vital signs and the nursing notes.  Pertinent labs & imaging results that were available during my care of the patient were reviewed by me and considered in my medical decision making (see chart for details).     Pt with persistent cough and nasal congestion.  Vitals reviewed.  No tachycardia or hypoxia or fever.    CXR from previous visit reviewed by me, was negative for acute process.  Likely viral process.  No indication for additional abx.  Currently using albuterol MDI.  She appears stable for dc, agrees to outpt f/u.    Final Clinical Impressions(s) / ED Diagnoses   Final diagnoses:  Cough    ED Discharge Orders        Ordered    benzonatate (TESSALON) 200 MG capsule  3 times daily PRN     11/04/17 1237       Kem Parkinson, PA-C 11/04/17 2141    Francine Graven, DO 11/05/17 [phone]

## 2018-02-09 DIAGNOSIS — J0100 Acute maxillary sinusitis, unspecified: Secondary | ICD-10-CM | POA: Diagnosis not present

## 2018-02-09 DIAGNOSIS — J309 Allergic rhinitis, unspecified: Secondary | ICD-10-CM | POA: Diagnosis not present

## 2018-02-13 ENCOUNTER — Encounter (HOSPITAL_COMMUNITY): Payer: Self-pay | Admitting: Emergency Medicine

## 2018-02-13 ENCOUNTER — Emergency Department (HOSPITAL_COMMUNITY): Payer: Medicare Other

## 2018-02-13 ENCOUNTER — Other Ambulatory Visit: Payer: Self-pay

## 2018-02-13 ENCOUNTER — Emergency Department (HOSPITAL_COMMUNITY)
Admission: EM | Admit: 2018-02-13 | Discharge: 2018-02-13 | Disposition: A | Discharge status: Home / Self Care | Payer: Medicare Other | Attending: Emergency Medicine | Admitting: Emergency Medicine

## 2018-02-13 DIAGNOSIS — R05 Cough: Secondary | ICD-10-CM | POA: Diagnosis not present

## 2018-02-13 DIAGNOSIS — I10 Essential (primary) hypertension: Secondary | ICD-10-CM | POA: Diagnosis not present

## 2018-02-13 DIAGNOSIS — F17210 Nicotine dependence, cigarettes, uncomplicated: Secondary | ICD-10-CM | POA: Diagnosis not present

## 2018-02-13 DIAGNOSIS — R0989 Other specified symptoms and signs involving the circulatory and respiratory systems: Secondary | ICD-10-CM | POA: Diagnosis present

## 2018-02-13 DIAGNOSIS — J40 Bronchitis, not specified as acute or chronic: Secondary | ICD-10-CM

## 2018-02-13 DIAGNOSIS — R0789 Other chest pain: Secondary | ICD-10-CM | POA: Diagnosis not present

## 2018-02-13 DIAGNOSIS — J029 Acute pharyngitis, unspecified: Secondary | ICD-10-CM | POA: Diagnosis not present

## 2018-02-13 DIAGNOSIS — R062 Wheezing: Secondary | ICD-10-CM | POA: Diagnosis not present

## 2018-02-13 MED ORDER — ALBUTEROL SULFATE HFA 108 (90 BASE) MCG/ACT IN AERS: 2.0000 | INHALATION_SPRAY | RESPIRATORY_TRACT | 0 refills | Status: DC | PRN

## 2018-02-13 MED ORDER — DOXYCYCLINE HYCLATE 100 MG PO CAPS: 100.0000 mg | ORAL_CAPSULE | Freq: Two times a day (BID) | ORAL | 0 refills | Status: DC

## 2018-02-13 MED ORDER — PREDNISONE 20 MG PO TABS: ORAL_TABLET | ORAL | 0 refills | Status: DC

## 2018-02-13 MED ORDER — BENZONATATE 100 MG PO CAPS
100.0000 mg | ORAL_CAPSULE | Freq: Once | ORAL | Status: AC
  Administered 2018-02-13: 100 mg via ORAL
  Filled 2018-02-13: qty 1

## 2018-02-13 MED ORDER — PREDNISONE 50 MG PO TABS
60.0000 mg | ORAL_TABLET | Freq: Once | ORAL | Status: AC
  Administered 2018-02-13: 60 mg via ORAL
  Filled 2018-02-13: qty 1

## 2018-02-13 MED ORDER — DOXYCYCLINE HYCLATE 100 MG PO TABS
100.0000 mg | ORAL_TABLET | Freq: Once | ORAL | Status: AC
  Administered 2018-02-13: 100 mg via ORAL
  Filled 2018-02-13: qty 1

## 2018-02-13 MED ORDER — ALBUTEROL SULFATE HFA 108 (90 BASE) MCG/ACT IN AERS
2.0000 | INHALATION_SPRAY | Freq: Once | RESPIRATORY_TRACT | Status: AC
  Administered 2018-02-13: 2 via RESPIRATORY_TRACT
  Filled 2018-02-13: qty 6.7

## 2018-02-13 MED ORDER — BENZONATATE 100 MG PO CAPS: 100.0000 mg | ORAL_CAPSULE | Freq: Three times a day (TID) | ORAL | 0 refills | Status: DC | PRN

## 2018-02-13 NOTE — ED Provider Notes (Signed)
Emergency Department Provider Note   I have reviewed the triage vital signs and the nursing notes.   HISTORY  Chief Complaint chest congestion   HPI Jaclyn Brooks is a 57 y.o. female proximately 1 week of progressively worsening ear fullness, cough, sore throat, chest congestion and wheezing.  States she saw somebody a few days ago with start azithromycin and took her last dose yesterday and seems to be continuing to get worse.  She has not tried anything else for her symptoms.  Initially thought it might be allergies but is never had allergies that were quite like this.  She states she does smoke 1/2 pack a day was not been able to do it recently because of how bad the coughing is.  No fevers.  Sputum is white but nothing [place], green, yellow or bloody.  No sick contacts.  No recent travels.  No lower extremity swelling.  No chest pain.  Shortness of breath only with coughing. No other associated or modifying symptoms.    Past Medical History:  Diagnosis Date  . Gout   . Hypertension   . 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HCC)     There are no active problems to display for this patient.   Past Surgical History:  Procedure Laterality Date  . ABDOMINAL HYSTERECTOMY    . CHOLECYSTECTOMY N/A 01/28/2015   Procedure: LAPAROSCOPIC CHOLECYSTECTOMY;  Surgeon: Aviva Signs Md, MD;  Location: [facility];  Service: General;  Laterality: N/A;  . excision of bone spurs Bilateral    Big toes  . renal aneurysm Right    right coil-and only has function of left kidney  . X-STOP IMPLANTATION      Current Outpatient Rx  . Order #: [id number] Class: Historical Med  . Order #: [id number] Class: Historical Med  . Order #: [id number] Class: Historical Med  . Order #: [id number] Class: Historical Med  . Order #: [id number] Class: Historical Med  . Order #: [id number] Class: Historical Med  . Order #: [id number] Class: Historical Med  . Order #: [id number] Class: Historical Med  . Order #: [id number] Class: Print  . Order  #: [id number] Class: Print  . Order #: [id number] Class: Print  . Order #: [id number] Class: Print    Allergies Bee venom and Shellfish allergy  Family History  Problem Relation Age of Onset  . Hypertension Mother   . Hypertension Father     Social History Social History   Tobacco Use  . Smoking status: Current Every Day Smoker    Packs/day: 0.25    Years: 20.00    Pack years: 5.00    Types: Cigarettes  . Smokeless tobacco: Never Used  Substance Use Topics  . Alcohol use: No  . Drug use: No    Review of Systems  All other systems negative except as documented in the HPI. All pertinent positives and negatives as reviewed in the HPI. ____________________________________________   PHYSICAL EXAM:  VITAL SIGNS: ED Triage Vitals  Enc Vitals Group     BP 02/13/18 1455 135/81     Pulse Rate 02/13/18 1455 (!) 57     Resp 02/13/18 1455 18     Temp 02/13/18 1455 98.5 F (36.9 C)     Temp Source 02/13/18 1455 Oral     SpO2 02/13/18 1455 100 %     Weight 02/13/18 1455 161 lb (73 kg)     Height 02/13/18 1455 5\' 6"  (1.676 m)     Head Circumference --      Peak Flow --  Pain Score 02/13/18 1454 0     Pain Loc --      Pain Edu? --      Excl. in [place]? --     Constitutional: Alert and oriented. Well appearing and in no acute distress. Eyes: Conjunctivae are normal. PERRL. EOMI. Head: Atraumatic. Nose: No congestion/rhinnorhea. Mouth/Throat: Mucous membranes are moist.  Oropharynx non-erythematous. Neck: No stridor.  No meningeal signs.   Cardiovascular: Normal rate, regular rhythm. Good peripheral circulation. Grossly normal heart sounds.   Respiratory: Normal respiratory effort.  No retractions. Lungs with diffuse wheezing and diminished breath sounds. Gastrointestinal: Soft and nontender. No distention.  Musculoskeletal: No lower extremity tenderness nor edema. No gross deformities of extremities. Neurologic:  Normal speech and language. No gross focal neurologic  deficits are appreciated.  Skin:  Skin is warm, dry and intact. No rash noted.  ____________________________________________  RADIOLOGY  Dg Chest 2 View  Result Date: 02/13/2018 CLINICAL DATA:  Cough, wheezing, congestion, and fever for the past 3-4 days. EXAM: CHEST - 2 VIEW COMPARISON:  Chest x-ray dated October 29, 2017. FINDINGS: The heart size and mediastinal contours are within normal limits. Normal pulmonary vascularity. No focal consolidation, pleural effusion, or pneumothorax. No acute osseous abnormality. Unchanged embolization coils in the right upper quadrant. IMPRESSION: No active cardiopulmonary disease. Electronically Signed   By: Titus Dubin M.D.   On: 02/13/2018 15:34  ____________________________________________   INITIAL IMPRESSION / ASSESSMENT AND PLAN / ED COURSE  I suspect the patient likely has bronchitis as cause of her symptoms.  I have low concern for ACS, pulmonary embolus, consolidative pneumonia or heart failure as cause for her symptoms.  Her vital signs are within normal limits she has no evidence of respiratory distress (no nasal flaring, retractions, tachypnea or hypoxia) that would require further work-up and management in the hospital or the emergency room.  Will rx for prednisone, inhaler and antibiotics. Patient stable for discharge at this time.  Pertinent labs & imaging results that were available during my care of the patient were reviewed by me and considered in my medical decision making (see chart for details).  ____________________________________________  FINAL CLINICAL IMPRESSION(S) / ED DIAGNOSES  Final diagnoses:  Bronchitis     MEDICATIONS GIVEN DURING THIS VISIT:  Medications  albuterol (PROVENTIL HFA;VENTOLIN HFA) 108 (90 Base) MCG/ACT inhaler 2 puff (has no administration in time range)  predniSONE (DELTASONE) tablet 60 mg (has no administration in time range)  doxycycline (VIBRA-TABS) tablet 100 mg (has no administration in  time range)  benzonatate (TESSALON) capsule 100 mg (has no administration in time range)     NEW OUTPATIENT MEDICATIONS STARTED DURING THIS VISIT:  New Prescriptions   ALBUTEROL (PROVENTIL HFA;VENTOLIN HFA) 108 (90 BASE) MCG/ACT INHALER    Inhale 2 puffs into the lungs every 4 (four) hours as needed for wheezing or shortness of breath (cough).   BENZONATATE (TESSALON) 100 MG CAPSULE    Take 1 capsule (100 mg total) by mouth 3 (three) times daily as needed for cough.   DOXYCYCLINE (VIBRAMYCIN) 100 MG CAPSULE    Take 1 capsule (100 mg total) by mouth 2 (two) times daily. One po bid x 7 days   PREDNISONE (DELTASONE) 20 MG TABLET    2 tabs po daily x 4 days    Note:  This note was prepared with assistance of Dragon voice recognition software. Occasional wrong-word or sound-a-like substitutions may have occurred due to the inherent limitations of voice recognition software.   Mackenzy Grumbine, Corene Cornea, MD 02/13/18  1552  

## 2018-02-13 NOTE — ED Triage Notes (Signed)
Onset  2 days ago, coughing with white sputum, wheezing, ears and throat pain.  Chest congestion.

## 2018-05-18 ENCOUNTER — Encounter: Payer: Self-pay | Admitting: *Deleted

## 2018-05-18 ENCOUNTER — Other Ambulatory Visit: Payer: Self-pay | Admitting: *Deleted

## 2018-09-11 ENCOUNTER — Ambulatory Visit (INDEPENDENT_AMBULATORY_CARE_PROVIDER_SITE_OTHER): Payer: Medicare Other | Admitting: Family Medicine

## 2018-09-11 ENCOUNTER — Encounter: Payer: Self-pay | Admitting: Family Medicine

## 2018-09-11 ENCOUNTER — Other Ambulatory Visit: Payer: Self-pay

## 2018-09-11 VITALS — BP 128/68 | HR 60 | Temp 98.6°F | Resp 16 | Ht 66.0 in | Wt 165.0 lb

## 2018-09-11 DIAGNOSIS — N182 Chronic kidney disease, stage 2 (mild): Secondary | ICD-10-CM | POA: Diagnosis not present

## 2018-09-11 DIAGNOSIS — R011 Cardiac murmur, unspecified: Secondary | ICD-10-CM

## 2018-09-11 DIAGNOSIS — Z87891 Personal history of nicotine dependence: Secondary | ICD-10-CM | POA: Diagnosis not present

## 2018-09-11 DIAGNOSIS — L720 Epidermal cyst: Secondary | ICD-10-CM | POA: Diagnosis not present

## 2018-09-11 DIAGNOSIS — R0602 Shortness of breath: Secondary | ICD-10-CM

## 2018-09-11 DIAGNOSIS — I10 Essential (primary) hypertension: Secondary | ICD-10-CM

## 2018-09-11 MED ORDER — LORAZEPAM 0.5 MG PO TABS: 0.5000 mg | ORAL_TABLET | Freq: Two times a day (BID) | ORAL | 1 refills | Status: DC | PRN

## 2018-09-11 NOTE — Progress Notes (Signed)
Subjective:    Patient ID: Jaclyn Brooks, female    DOB: 01/30/1961, 57 y.o.   MRN: [id number]  Patient presents for Medicare Wellness (is not fasting); SOB on Exertion (states that since she stopped smoking she has been having some SOBE- also reports that she has sinus pressure); and Knot to L shoulder (cyst like area to L shoulder)    Previous PCP- Dr. Karie Kirks, has not been seen  in the office for the past year       She has been in the ER multiple times over the past year for bronchitis, she has smoked 1/2 PPD for  > 10 years, then quit in march, but started smoking cigars almost daily. Stopped smoking cigars.   She does get SOB with exertion. No chest pain. No chronic cougth, but has wheezing.   She has chronic nasal congestion- mostly on left side, takes OTC tylenlol ol  Ol  Uses albuterol rarely, has not had PFT      Has not had flu shots or pneumonia shot        HTN- diagnosed in her 28's. Currently atenolol and cardizem 240mg       Had  Clot on renal vessel  On right side, has coill in right kidney has been on plavix       Has functioning left kidney, she is not aware of her CKD   No cancer diagnosis     Feels she has IBS, often goes 2-3 days without a bowel movement, then she has constipation       Immunizations- no TDAP in the past 10 years - DECLINES FLU/ PNA    Hysterectomy- NO PAP Smears, had fibroids    Has history of Gout , no current medications      Depression/ anxiety - her depression centers around her mothers death, she feels previous PCP gave her the wrong medication? ,  She uses ativan 0.5mg  takes as needed.             Mammogram- Due     Colonosocpy- Due      Has cyst on left shoulder present for months,    Review Of Systems:  GEN- denies fatigue, fever, weight loss,weakness, recent illness HEENT- denies eye drainage, change in vision, nasal discharge, CVS- denies chest pain, palpitations RESP- denies SOB, cough, wheeze ABD- denies N/V, change in  stools, abd pain GU- denies dysuria, hematuria, dribbling, incontinence MSK- denies joint pain, muscle aches, injury Neuro- denies headache, dizziness, syncope, seizure activity       Objective:    BP 128/68   Pulse 60   Temp 98.6 F (37 C) (Oral)   Resp 16   Ht 5\' 6"  (1.676 m)   Wt 165 lb (74.8 kg)   SpO2 98%   BMI 26.63 kg/m  GEN- NAD, alert and oriented x3 HEENT- PERRL, EOMI, non injected sclera, pink conjunctiva, MMM, oropharynx clear Neck- Supple, no thyromegaly CVS- RRR, 2/6 SEM  RESP-CTAB ABD-NABS,soft,NT,ND Psych- anxious, not depressed, no SI, well groomed, normal speech  Skin-left shoulder- epidermoid cyst, NT, core at center, non fluctant EXT- No edema Pulses- Radial, DP- 2+        Assessment & Plan:      Problem List Items Addressed This Visit      Unprioritized   Hypertension    Well controlled no changes to meds, no difficulty with gout, so continue HCTZ combo      Relevant Orders   CBC with Differential/Platelet (Completed)  Comprehensive metabolic panel (Completed)   TSH (Completed)    Other Visit Diagnoses    Heart murmur    -  Primary   Obtain 2D Echo, in setting of HTN, renal stenosis with clot in past.    Stage 2 chronic kidney disease       obtain records from previous PCP, labs done today, she is very anxious about hearing she has kidney disease which was noted back to 2016, she does not follow withnephrology and if she has 1 functioning kidney with CKD she will need to establish with them    Relevant Orders   Comprehensive metabolic panel (Completed)   Epidermoid cyst of skin       at this time, will not intervene and concentrate on above issues    SOB (shortness of breath) on exertion       with smoking history concern for COPD, obtain PFT and 2D echo, uses prn albuterol sparingly      Note: This dictation was prepared with Dragon dictation along with smaller phrase technology. Any transcriptional errors that result from this  process are unintentional.

## 2018-09-11 NOTE — Patient Instructions (Addendum)
Release of records- Dr. Karie Kirks  2D Echo to be done PFT to be done  F/U pending results

## 2018-09-12 ENCOUNTER — Encounter: Payer: Self-pay | Admitting: Family Medicine

## 2018-09-12 LAB — COMPREHENSIVE METABOLIC PANEL
AG Ratio: 1.4 (calc) (ref 1.0–2.5)
ALT: 11 U/L (ref 6–29)
AST: 13 U/L (ref 10–35)
Albumin: 3.8 g/dL (ref 3.6–5.1)
Alkaline phosphatase (APISO): 74 U/L (ref 33–130)
BUN/Creatinine Ratio: 19 (calc) (ref 6–22)
BUN: 34 mg/dL — ABNORMAL HIGH (ref 7–25)
CO2: 24 mmol/L (ref 20–32)
Calcium: 9.4 mg/dL (ref 8.6–10.4)
Chloride: 108 mmol/L (ref 98–110)
Creat: 1.75 mg/dL — ABNORMAL HIGH (ref 0.50–1.05)
Globulin: 2.8 g/dL (calc) (ref 1.9–3.7)
Glucose, Bld: 106 mg/dL — ABNORMAL HIGH (ref 65–99)
Potassium: 3.7 mmol/L (ref 3.5–5.3)
Sodium: 142 mmol/L (ref 135–146)
Total Bilirubin: 0.3 mg/dL (ref 0.2–1.2)
Total Protein: 6.6 g/dL (ref 6.1–8.1)

## 2018-09-12 LAB — CBC WITH DIFFERENTIAL/PLATELET
Basophils Absolute: 11 cells/uL (ref 0–200)
Basophils Relative: 0.2 %
Eosinophils Absolute: 97 cells/uL (ref 15–500)
Eosinophils Relative: 1.8 %
HCT: 39.5 % (ref 35.0–45.0)
Hemoglobin: 13.8 g/dL (ref 11.7–15.5)
Lymphs Abs: 1760 cells/uL (ref 850–3900)
MCH: 35.2 pg — ABNORMAL HIGH (ref 27.0–33.0)
MCHC: 34.9 g/dL (ref 32.0–36.0)
MCV: 100.8 fL — ABNORMAL HIGH (ref 80.0–100.0)
MPV: 10.1 fL (ref 7.5–12.5)
Monocytes Relative: 9.9 %
Neutro Abs: 2997 cells/uL (ref 1500–7800)
Neutrophils Relative %: 55.5 %
Platelets: 279 10*3/uL (ref 140–400)
RBC: 3.92 10*6/uL (ref 3.80–5.10)
RDW: 12.7 % (ref 11.0–15.0)
Total Lymphocyte: 32.6 %
WBC mixed population: 535 cells/uL (ref 200–950)
WBC: 5.4 10*3/uL (ref 3.8–10.8)

## 2018-09-12 LAB — TSH: TSH: 1.74 mIU/L (ref 0.40–4.50)

## 2018-09-12 NOTE — Assessment & Plan Note (Signed)
Well controlled no changes to meds, no difficulty with gout, so continue HCTZ combo

## 2018-09-13 ENCOUNTER — Other Ambulatory Visit: Payer: Self-pay | Admitting: *Deleted

## 2018-09-13 DIAGNOSIS — IMO0002 Reserved for concepts with insufficient information to code with codable children: Secondary | ICD-10-CM

## 2018-09-13 DIAGNOSIS — N183 Chronic kidney disease, stage 3 unspecified: Secondary | ICD-10-CM

## 2018-09-13 DIAGNOSIS — Q600 Renal agenesis, unilateral: Secondary | ICD-10-CM

## 2018-09-14 ENCOUNTER — Ambulatory Visit (HOSPITAL_COMMUNITY)
Admission: RE | Admit: 2018-09-14 | Discharge: 2018-09-14 | Disposition: A | Discharge status: Home / Self Care | Payer: Medicare Other | Source: Ambulatory Visit | Attending: Family Medicine | Admitting: Family Medicine

## 2018-09-14 DIAGNOSIS — R011 Cardiac murmur, unspecified: Secondary | ICD-10-CM | POA: Diagnosis not present

## 2018-09-14 DIAGNOSIS — I361 Nonrheumatic tricuspid (valve) insufficiency: Secondary | ICD-10-CM

## 2018-09-14 DIAGNOSIS — R0602 Shortness of breath: Secondary | ICD-10-CM | POA: Diagnosis not present

## 2018-09-14 NOTE — Progress Notes (Signed)
*  PRELIMINARY RESULTS* Echocardiogram 2D Echocardiogram has been performed.  Jaclyn Brooks 09/14/2018, 1:28 PM

## 2018-09-26 ENCOUNTER — Ambulatory Visit (HOSPITAL_COMMUNITY)
Admission: RE | Admit: 2018-09-26 | Discharge: 2018-09-26 | Disposition: A | Discharge status: Home / Self Care | Payer: Medicare Other | Source: Ambulatory Visit | Attending: Family Medicine | Admitting: Family Medicine

## 2018-09-26 DIAGNOSIS — Z87891 Personal history of nicotine dependence: Secondary | ICD-10-CM | POA: Diagnosis not present

## 2018-09-26 DIAGNOSIS — R0602 Shortness of breath: Secondary | ICD-10-CM | POA: Insufficient documentation

## 2018-09-26 LAB — PULMONARY FUNCTION TEST
DL/VA % pred: 96 %
DL/VA: 4.83 ml/min/mmHg/L
DLCO cor % pred: 72 %
DLCO cor: 19.63 ml/min/mmHg
DLCO unc % pred: 73 %
DLCO unc: 19.87 ml/min/mmHg
FEF 25-75 Post: 1.64 L/sec
FEF 25-75 Pre: 1.43 L/sec
FEF2575-%Change-Post: 14 %
FEF2575-%Pred-Post: 72 %
FEF2575-%Pred-Pre: 63 %
FEV1-%Change-Post: 2 %
FEV1-%Pred-Post: 86 %
FEV1-%Pred-Pre: 84 %
FEV1-Post: 2 L
FEV1-Pre: 1.94 L
FEV1FVC-%Change-Post: 5 %
FEV1FVC-%Pred-Pre: 92 %
FEV6-%Change-Post: -2 %
FEV6-%Pred-Post: 90 %
FEV6-%Pred-Pre: 92 %
FEV6-Post: 2.57 L
FEV6-Pre: 2.62 L
FEV6FVC-%Change-Post: 1 %
FEV6FVC-%Pred-Post: 103 %
FEV6FVC-%Pred-Pre: 102 %
FVC-%Change-Post: -2 %
FVC-%Pred-Post: 88 %
FVC-%Pred-Pre: 90 %
FVC-Post: 2.57 L
FVC-Pre: 2.65 L
Post FEV1/FVC ratio: 78 %
Post FEV6/FVC ratio: 100 %
Pre FEV1/FVC ratio: 73 %
Pre FEV6/FVC Ratio: 99 %
RV % pred: 160 %
RV: 3.31 L
TLC % pred: 109 %
TLC: 5.85 L

## 2018-09-26 MED ORDER — ALBUTEROL SULFATE (2.5 MG/3ML) 0.083% IN NEBU
2.5000 mg | INHALATION_SOLUTION | Freq: Once | RESPIRATORY_TRACT | Status: AC
Start: 2018-09-26 — End: 2018-09-26
  Administered 2018-09-26: 2.5 mg via RESPIRATORY_TRACT

## 2018-09-27 ENCOUNTER — Encounter: Payer: Self-pay | Admitting: *Deleted

## 2018-10-01 ENCOUNTER — Ambulatory Visit (INDEPENDENT_AMBULATORY_CARE_PROVIDER_SITE_OTHER): Payer: Medicare Other | Admitting: Family Medicine

## 2018-10-01 ENCOUNTER — Encounter: Payer: Self-pay | Admitting: Family Medicine

## 2018-10-01 ENCOUNTER — Telehealth: Payer: Self-pay | Admitting: *Deleted

## 2018-10-01 ENCOUNTER — Other Ambulatory Visit: Payer: Self-pay

## 2018-10-01 VITALS — BP 130/62 | HR 58 | Temp 98.6°F | Resp 14 | Ht 66.0 in | Wt 167.0 lb

## 2018-10-01 DIAGNOSIS — J449 Chronic obstructive pulmonary disease, unspecified: Secondary | ICD-10-CM

## 2018-10-01 DIAGNOSIS — L720 Epidermal cyst: Secondary | ICD-10-CM | POA: Diagnosis not present

## 2018-10-01 DIAGNOSIS — N183 Chronic kidney disease, stage 3 unspecified: Secondary | ICD-10-CM | POA: Insufficient documentation

## 2018-10-01 DIAGNOSIS — I358 Other nonrheumatic aortic valve disorders: Secondary | ICD-10-CM

## 2018-10-01 MED ORDER — BUDESONIDE-FORMOTEROL FUMARATE 80-4.5 MCG/ACT IN AERO: 2.0000 | INHALATION_SPRAY | Freq: Two times a day (BID) | RESPIRATORY_TRACT | 3 refills | Status: DC

## 2018-10-01 NOTE — Assessment & Plan Note (Signed)
Referral to nephrology. 

## 2018-10-01 NOTE — Progress Notes (Signed)
   Subjective:    Patient ID: Jaclyn Brooks, female    DOB: [birth year], 57 y.o.   MRN: [id number]  Patient presents for Follow-up (ECHO/PFT)  Here to follow-up her testing.  We reviewed her labs.  She has chronic kidney disease with solitary functioning kidney.  She not had any labs in the past 2 years per her record she refused these to be done.  Her creatinine is down to 1.75 sure over what period of time it became this.  Recommendation to her was to establish care with a nephrologist and she has a functioning kidney.  She is on board with this.  She was having some shortness of breath.  On physical exam she was found to have a murmur.  The echo was performed she has normal ejection fraction with some mild diastolic dysfunction but she also sclerosis of her aortic valve without stenosis.  This was reviewed with her at the bedside.  Patient history of smoking although she did quit recently.  She her pulmonary function test did show mild COPD but no reversibility with albuterol.  She states that her breathing has actually improved over the past couple weeks.  All of her recent lab work was also reviewed at the bedside.  Would like to proceed with having the cyst removed off her shoulder. Review Of Systems:  GEN- denies fatigue, fever, weight loss,weakness, recent illness HEENT- denies eye drainage, change in vision, nasal discharge, CVS- denies chest pain, palpitations RESP- denies SOB, cough, wheeze ABD- denies N/V, change in stools, abd pain GU- denies dysuria, hematuria, dribbling, incontinence MSK- denies joint pain, muscle aches, injury Neuro- denies headache, dizziness, syncope, seizure activity       Objective:    BP 130/62   Pulse (!) 58   Temp 98.6 F (37 C) (Oral)   Resp 14   Ht 5\' 6"  (1.676 m)   Wt 167 lb (75.8 kg)   SpO2 99%   BMI 26.95 kg/m  GEN- NAD, alert and oriented x3 CVS- RRR, no murmur RESP-CTAB ABD-NABS,soft,NT,ND Skin-shoulder epidermoid cyst        Assessment & Plan:      Problem List Items Addressed This Visit      Unprioritized   Aortic valve sclerosis    Will monitor, no heart failure symptoms No stenosis      CKD (chronic kidney disease) stage 3, GFR 30-59 ml/min (HCC)    Referral to nephrology      Relevant Orders   Ambulatory referral to Nephrology   COPD, mild ([place])    She states breathing is much improved.  Will give her a sample of Symbicort to have on hand.  She wants to hold onto something in case it does worsen again and then she will try it.  She is to remain off of tobacco products.      Relevant Medications   budesonide-formoterol (SYMBICORT) 80-4.5 MCG/ACT inhaler    Other Visit Diagnoses    Epidermal cyst    -  Primary   Relevant Orders   Ambulatory referral to General Surgery      Note: This dictation was prepared with Dragon dictation along with smaller phrase technology. Any transcriptional errors that result from this process are unintentional.

## 2018-10-01 NOTE — Patient Instructions (Addendum)
Decrease beer 2 a night No aleve, motrin, advil ,ibuprofen Referral surgery for removal of knot on left shoulder  Referral to nephrology- [place] F/U 3 months

## 2018-10-01 NOTE — Assessment & Plan Note (Signed)
Will monitor, no heart failure symptoms No stenosis

## 2018-10-01 NOTE — Telephone Encounter (Signed)
-----   Message from Alycia Rossetti, MD sent at 10/01/2018  2:35 PM EST -----  Pt to pick up the medication then, it has a film to help and take with food, she stopped taking it ----- Message ----- From: Sheral Flow, LPN Sent: 08/08/1313   1:21 PM EST To: Alycia Rossetti, MD  Call placed to pharmacy.   All clopidogrel is film covered.  They have been giving her the pharmacy preferred brand for months with no issue until the last 2 months.   Advised that she may need to eat with medication.   MD to be made aware.

## 2018-10-01 NOTE — Assessment & Plan Note (Signed)
She states breathing is much improved.  Will give her a sample of Symbicort to have on hand.  She wants to hold onto something in case it does worsen again and then she will try it.  She is to remain off of tobacco products.

## 2018-10-01 NOTE — Telephone Encounter (Signed)
Call placed to patient and patient made aware per VM.  

## 2018-10-24 DIAGNOSIS — L723 Sebaceous cyst: Secondary | ICD-10-CM | POA: Diagnosis not present

## 2018-10-25 ENCOUNTER — Other Ambulatory Visit: Payer: Self-pay | Admitting: *Deleted

## 2018-10-25 MED ORDER — DICLOFENAC SODIUM ER 100 MG PO TB24: 100.0000 mg | ORAL_TABLET | Freq: Every day | ORAL | 2 refills | Status: DC

## 2018-12-04 DIAGNOSIS — N183 Chronic kidney disease, stage 3 (moderate): Secondary | ICD-10-CM | POA: Diagnosis not present

## 2018-12-04 DIAGNOSIS — I358 Other nonrheumatic aortic valve disorders: Secondary | ICD-10-CM | POA: Diagnosis not present

## 2018-12-04 DIAGNOSIS — J449 Chronic obstructive pulmonary disease, unspecified: Secondary | ICD-10-CM | POA: Diagnosis not present

## 2018-12-04 DIAGNOSIS — D631 Anemia in chronic kidney disease: Secondary | ICD-10-CM | POA: Diagnosis not present

## 2018-12-04 DIAGNOSIS — N2581 Secondary hyperparathyroidism of renal origin: Secondary | ICD-10-CM | POA: Diagnosis not present

## 2018-12-04 DIAGNOSIS — N189 Chronic kidney disease, unspecified: Secondary | ICD-10-CM | POA: Diagnosis not present

## 2018-12-04 DIAGNOSIS — I129 Hypertensive chronic kidney disease with stage 1 through stage 4 chronic kidney disease, or unspecified chronic kidney disease: Secondary | ICD-10-CM | POA: Diagnosis not present

## 2018-12-05 ENCOUNTER — Other Ambulatory Visit: Payer: Self-pay | Admitting: Nephrology

## 2018-12-05 DIAGNOSIS — N2581 Secondary hyperparathyroidism of renal origin: Secondary | ICD-10-CM

## 2018-12-05 DIAGNOSIS — N183 Chronic kidney disease, stage 3 unspecified: Secondary | ICD-10-CM

## 2018-12-05 DIAGNOSIS — I129 Hypertensive chronic kidney disease with stage 1 through stage 4 chronic kidney disease, or unspecified chronic kidney disease: Secondary | ICD-10-CM

## 2018-12-05 DIAGNOSIS — I358 Other nonrheumatic aortic valve disorders: Secondary | ICD-10-CM

## 2018-12-05 DIAGNOSIS — D631 Anemia in chronic kidney disease: Secondary | ICD-10-CM

## 2018-12-05 DIAGNOSIS — J449 Chronic obstructive pulmonary disease, unspecified: Secondary | ICD-10-CM

## 2018-12-11 ENCOUNTER — Other Ambulatory Visit: Payer: Self-pay

## 2018-12-11 ENCOUNTER — Encounter: Payer: Self-pay | Admitting: Family Medicine

## 2018-12-11 ENCOUNTER — Telehealth: Payer: Self-pay | Admitting: Family Medicine

## 2018-12-11 ENCOUNTER — Ambulatory Visit (INDEPENDENT_AMBULATORY_CARE_PROVIDER_SITE_OTHER): Payer: Medicare Other | Admitting: Family Medicine

## 2018-12-11 VITALS — BP 124/64 | HR 66 | Temp 98.7°F | Resp 12 | Ht 66.0 in | Wt 173.0 lb

## 2018-12-11 DIAGNOSIS — I6523 Occlusion and stenosis of bilateral carotid arteries: Secondary | ICD-10-CM | POA: Diagnosis not present

## 2018-12-11 DIAGNOSIS — M10072 Idiopathic gout, left ankle and foot: Secondary | ICD-10-CM

## 2018-12-11 DIAGNOSIS — L0232 Furuncle of buttock: Secondary | ICD-10-CM

## 2018-12-11 DIAGNOSIS — J0100 Acute maxillary sinusitis, unspecified: Secondary | ICD-10-CM

## 2018-12-11 DIAGNOSIS — I739 Peripheral vascular disease, unspecified: Secondary | ICD-10-CM

## 2018-12-11 DIAGNOSIS — I779 Disorder of arteries and arterioles, unspecified: Secondary | ICD-10-CM | POA: Insufficient documentation

## 2018-12-11 MED ORDER — SULFAMETHOXAZOLE-TRIMETHOPRIM 800-160 MG PO TABS: 1.0000 | ORAL_TABLET | Freq: Two times a day (BID) | ORAL | 0 refills | Status: DC

## 2018-12-11 MED ORDER — TRIAMTERENE-HCTZ 37.5-25 MG PO TABS: 1.0000 | ORAL_TABLET | Freq: Every day | ORAL | 3 refills | Status: DC

## 2018-12-11 MED ORDER — PREDNISONE 10 MG PO TABS: ORAL_TABLET | ORAL | 0 refills | Status: DC

## 2018-12-11 MED ORDER — DICLOFENAC SODIUM ER 100 MG PO TB24: 100.0000 mg | ORAL_TABLET | Freq: Every day | ORAL | 2 refills | Status: DC

## 2018-12-11 MED ORDER — CLOPIDOGREL BISULFATE 75 MG PO TABS: 75.0000 mg | ORAL_TABLET | Freq: Every evening | ORAL | 3 refills | Status: DC

## 2018-12-11 MED ORDER — DILTIAZEM HCL ER COATED BEADS 240 MG PO CP24: 240.0000 mg | ORAL_CAPSULE | Freq: Every day | ORAL | 3 refills | Status: DC

## 2018-12-11 MED ORDER — ATENOLOL 50 MG PO TABS: 50.0000 mg | ORAL_TABLET | Freq: Every day | ORAL | 3 refills | Status: DC

## 2018-12-11 NOTE — Telephone Encounter (Signed)
Refill on atenolol, diclofenac, and triamterene to [facility].

## 2018-12-11 NOTE — Patient Instructions (Addendum)
We will call with results  [place] to be done  Take antibiotics  Use nasal saline / or flonase  Use [place] HBP/ Robitussin DM/ Mucinex  F/U 3 months

## 2018-12-11 NOTE — Progress Notes (Signed)
Orthostatic Blood Pressure:     O2 HR Lying:   128/62  98 56   Sitting:  118/64  97 54  Standing:  128/70  98 54   Standing x2: 122/66  99 56

## 2018-12-11 NOTE — Progress Notes (Signed)
   Subjective:    Patient ID: Jaclyn Brooks, female    DOB: May 27, 1961, 58 y.o.   MRN: [id number]  Patient presents for Hypercalcemia (was told by nephro that calcium is too high); Illness (lightheaded, nausea, ear pain, sinus drainage); and Gout (x 2 months- uppeer foot pain that changes sides)  Feeling lightheaded for past couple of weeks, sinus pressure and drainage, has had some nausea, ear pain, no sore throat, has had hoarse voice. Took tylenol sinus OTC. Has had some dizzy spells   No vomiting, little diarrhea   No cough    Told her calcium was high - states she has been drinking vinegar in water, cutting back on diary products, no calcium containing vitamins    Gout pain on top of foot, left great toe initially, buthas also had in the right  BC Powder, has had recurrent flares     History of right carotid artery occulusion > 10 years ago, has not had this looked at again   Was on statin in the past   End of visit states she gets boils had one drain on buttocks, now almost gone - was not examined      Review Of Systems:  GEN- denies fatigue, fever, weight loss,weakness, recent illness HEENT- denies eye drainage, change in vision,+ nasal discharge, CVS- denies chest pain, palpitations RESP- denies SOB, cough, wheeze ABD- denies N/V, change in stools, abd pain GU- denies dysuria, hematuria, dribbling, incontinence MSK- + joint pain, muscle aches, injury Neuro- denies headache,+ dizziness, syncope, seizure activity       Objective:    BP 124/64   Pulse 66   Temp 98.7 F (37.1 C) (Oral)   Resp 12   Ht 5\' 6"  (1.676 m)   Wt 173 lb (78.5 kg)   SpO2 98%   BMI 27.92 kg/m  GEN- NAD, alert and oriented x3 HEENT- PERRL, EOMI, non injected sclera, pink conjunctiva, MMM, oropharynx clear, TM clear, no effusion, nares inflammed, mild maxillary sinus tenderness Neck- Supple, no thyromegaly, no LAD, left bruit  CVS- RRR, no murmur RESP-CTAB ABD-NABS,soft,NT,ND EXT- swelling  left great toe and top of foot   Pulses- Radial, DP- 2+        Assessment & Plan:   obtain nephrology note   Problem List Items Addressed This Visit      Unprioritized   Carotid arterial disease ([place])    Carotid US to be done to re-evaluate will most likley need high dose statin      Relevant Orders   US Carotid Duplex Bilateral    Other Visit Diagnoses    Acute idiopathic gout of left foot    -  Primary   prednisone taper, avoid NSAID 1 functioning kidney, check uric acid,may need allopurinol, discussed foods to avoid   Relevant Medications   predniSONE (DELTASONE) 10 MG tablet   Other Relevant Orders   Uric Acid   Acute non-recurrent maxillary sinusitis       nasal rinse, bactrim, corcidan   Relevant Medications   sulfamethoxazole-trimethoprim (BACTRIM DS,SEPTRA DS) 800-160 MG tablet   predniSONE (DELTASONE) 10 MG tablet   Boil of buttock       did not exam will be covered with bactrim per above   Relevant Medications   sulfamethoxazole-trimethoprim (BACTRIM DS,SEPTRA DS) 800-160 MG tablet      Note: This dictation was prepared with Dragon dictation along with smaller phrase technology. Any transcriptional errors that result from this process are unintentional.

## 2018-12-11 NOTE — Telephone Encounter (Signed)
Prescription sent to pharmacy.

## 2018-12-11 NOTE — Assessment & Plan Note (Signed)
Carotid US to be done to re-evaluate will most likley need high dose statin

## 2018-12-12 LAB — URIC ACID: Uric Acid, Serum: 9.2 mg/dL — ABNORMAL HIGH (ref 2.5–7.0)

## 2018-12-13 ENCOUNTER — Encounter: Payer: Self-pay | Admitting: *Deleted

## 2018-12-13 ENCOUNTER — Telehealth: Payer: Self-pay | Admitting: *Deleted

## 2018-12-13 MED ORDER — ALLOPURINOL 100 MG PO TABS: 50.0000 mg | ORAL_TABLET | Freq: Every day | ORAL | 3 refills | Status: DC

## 2018-12-13 NOTE — Telephone Encounter (Signed)
Received call from [facility]. Reports that patient has elected to have sebaceous cyst removed and will be excised in OR under IV sedation.   Reports that patient requires written letter of clearance for surgery and instructions on preoperative hold of Plavix.   Letter can be faxed to Illene Regulus, CMA at [phone]~ fax.  MD please advise.

## 2018-12-14 ENCOUNTER — Encounter: Payer: Self-pay | Admitting: Family Medicine

## 2018-12-14 NOTE — Telephone Encounter (Signed)
Letter done

## 2018-12-21 ENCOUNTER — Other Ambulatory Visit: Payer: Self-pay

## 2018-12-21 ENCOUNTER — Telehealth: Payer: Self-pay | Admitting: *Deleted

## 2018-12-21 ENCOUNTER — Ambulatory Visit (HOSPITAL_COMMUNITY)
Admission: RE | Admit: 2018-12-21 | Discharge: 2018-12-21 | Disposition: A | Discharge status: Home / Self Care | Payer: Medicare Other | Source: Ambulatory Visit | Attending: Family Medicine | Admitting: Family Medicine

## 2018-12-21 DIAGNOSIS — I6523 Occlusion and stenosis of bilateral carotid arteries: Secondary | ICD-10-CM | POA: Diagnosis not present

## 2018-12-21 NOTE — Telephone Encounter (Signed)
Received fax from [facility] with chart notes and labs.   MD reviewed notes/ labs and recommendations are as follows: Kidney function stable Avoid NSAIDs Hgb stable Nephrology to continue to monitor.   Call placed to patient and patient made aware.

## 2018-12-26 ENCOUNTER — Other Ambulatory Visit: Payer: Self-pay | Admitting: Family Medicine

## 2018-12-26 MED ORDER — ATORVASTATIN CALCIUM 40 MG PO TABS: 40.0000 mg | ORAL_TABLET | Freq: Every day | ORAL | 1 refills | Status: DC

## 2018-12-26 NOTE — Progress Notes (Signed)
Lipitor to be called in per orders from Dr. Buelah Manis please see US Carotid results.

## 2018-12-31 ENCOUNTER — Other Ambulatory Visit: Payer: Medicare Other

## 2019-01-17 ENCOUNTER — Telehealth: Payer: Self-pay | Admitting: Family Medicine

## 2019-01-17 NOTE — Telephone Encounter (Signed)
Patient called with c/o allergies and ear pain. She scheduled a telephone visit with Dr.Show Low on Monday and asked to be transferred to triage for medications she can take over the counter. Advised patient to try otc allergy medication, and flonase. Patient verbalized understanding.

## 2019-01-21 ENCOUNTER — Telehealth: Payer: Self-pay | Admitting: *Deleted

## 2019-01-21 ENCOUNTER — Ambulatory Visit (INDEPENDENT_AMBULATORY_CARE_PROVIDER_SITE_OTHER): Payer: Medicare Other | Admitting: Family Medicine

## 2019-01-21 ENCOUNTER — Encounter: Payer: Self-pay | Admitting: Family Medicine

## 2019-01-21 ENCOUNTER — Other Ambulatory Visit: Payer: Self-pay

## 2019-01-21 DIAGNOSIS — K580 Irritable bowel syndrome with diarrhea: Secondary | ICD-10-CM | POA: Diagnosis not present

## 2019-01-21 DIAGNOSIS — R0789 Other chest pain: Secondary | ICD-10-CM | POA: Diagnosis not present

## 2019-01-21 DIAGNOSIS — J302 Other seasonal allergic rhinitis: Secondary | ICD-10-CM

## 2019-01-21 MED ORDER — CEPHALEXIN 500 MG PO CAPS: 500.0000 mg | ORAL_CAPSULE | Freq: Two times a day (BID) | ORAL | 0 refills | Status: DC

## 2019-01-21 NOTE — Progress Notes (Addendum)
Virtual Visit via Telephone Note  I connected with Jaclyn Brooks on 01/21/19 at 9:45am  by telephone and verified that I am speaking with the correct person using two identifiers.  Patient location: At home Physician location: In office, [place], [facility]   I discussed the limitations, risks, security and privacy concerns of performing an evaluation and management service by telephone and the availability of in person appointments. I also discussed with the patient that there may be a patient responsible charge related to this service. The patient expressed understanding and agreed to proceed.   History of Present Illness: Patient called and spoke with triage nurse last week.  She was having sinus pressure, and  Pain behind left ear, but also had pressure, sneezing.  Back to taking oral antihistamine over-the-counter which she started.  Took xyzal weekend which has resolved most her symptoms including the sinus pressure and ear pain and sneezing.  With regards to the ear she denies any difficulty hearing denies any drainage only had wax when she did clean out her ears.. No significant cough, no fever   She has had some pain under right rib internittantly for the past week. Sometimes pain wakes her from sleep.No pain with eating,no difficulty with bowel, but does hav eboth lose and hard bowel movements this is been chronic.  No urinary changes or dysuria. H/o cholecystectomy She does admit that she may get a sharp pain when she turns a certain way.  She has not taken anything over-the-counter for pain.  Denies any rash on the skin.       Observations/Objective: Unable to observe,   Assessment and Plan:  Side pain this may be musculoskeletal I am unable to actually evaluate her over the phone physically.  Her pain is intermittent only she turns a certain way we will have her use Tylenol and a heating pad to the area.  Does not appear to have any discomfort with  eating.  Does not appear to have any urinary tract symptoms.  Other we did discuss the red flags with all of these.  I doubt this is appendicitis based on the location.  She not have any pain while she was sitting on the phone.  With regards to her bowel sounds like she might have some IBS as well has history of both constipation and diarrhea she can get probiotics to see if this helps with her digestive system.  If she does have blood in the stool or notices significant change she can call us back.   Pt called back, states she has had burning with urination?? wil go ahead and give keflex 500mg  BID x 7 days due to her CKD   Seasonal allergies/rhinitis-continue with Xyzal.  Discussed red flags if she has worsening facial pressure fever drainage call back.  She also has Flonase/nasal saline that she can use.  Follow Up Instructions:    I discussed the assessment and treatment plan with the patient. The patient was provided an opportunity to ask questions and all were answered. The patient agreed with the plan and demonstrated an understanding of the instructions.   The patient was advised to call back or seek an in-person evaluation if the symptoms worsen or if the condition fails to improve as anticipated.  I provided 11 minutes of non-face-to-face time during this encounter. End time 9:56  Vic Blackbird, MD

## 2019-01-21 NOTE — Telephone Encounter (Signed)
Call placed to patient and patient made aware.  

## 2019-01-21 NOTE — Telephone Encounter (Signed)
Received call from patient. [phone]~ telephone.   States that she had televisit with PCP this AM. Reports that she forgot to mention she does have some burning with urination.   MD please advise.

## 2019-01-21 NOTE — Telephone Encounter (Signed)
Keflex was called in.  °

## 2019-01-21 NOTE — Addendum Note (Signed)
Addended by: Vic Blackbird F on: 01/21/2019 02:19 PM   Modules accepted: Orders

## 2019-02-05 ENCOUNTER — Other Ambulatory Visit: Payer: Self-pay | Admitting: *Deleted

## 2019-02-05 MED ORDER — BUDESONIDE-FORMOTEROL FUMARATE 80-4.5 MCG/ACT IN AERO: 2.0000 | INHALATION_SPRAY | Freq: Two times a day (BID) | RESPIRATORY_TRACT | 3 refills | Status: DC

## 2019-02-11 ENCOUNTER — Other Ambulatory Visit: Payer: Medicare Other

## 2019-03-13 ENCOUNTER — Ambulatory Visit (INDEPENDENT_AMBULATORY_CARE_PROVIDER_SITE_OTHER): Payer: Medicare Other | Admitting: Family Medicine

## 2019-03-13 ENCOUNTER — Other Ambulatory Visit: Payer: Self-pay

## 2019-03-13 VITALS — BP 118/68 | HR 52 | Temp 98.6°F | Resp 18 | Ht 66.0 in | Wt 174.4 lb

## 2019-03-13 DIAGNOSIS — I10 Essential (primary) hypertension: Secondary | ICD-10-CM

## 2019-03-13 DIAGNOSIS — J449 Chronic obstructive pulmonary disease, unspecified: Secondary | ICD-10-CM | POA: Diagnosis not present

## 2019-03-13 DIAGNOSIS — N183 Chronic kidney disease, stage 3 unspecified: Secondary | ICD-10-CM

## 2019-03-13 DIAGNOSIS — R42 Dizziness and giddiness: Secondary | ICD-10-CM

## 2019-03-13 DIAGNOSIS — R0981 Nasal congestion: Secondary | ICD-10-CM

## 2019-03-13 DIAGNOSIS — I6523 Occlusion and stenosis of bilateral carotid arteries: Secondary | ICD-10-CM | POA: Diagnosis not present

## 2019-03-13 MED ORDER — ONDANSETRON HCL 4 MG PO TABS: 4.0000 mg | ORAL_TABLET | Freq: Three times a day (TID) | ORAL | 0 refills | Status: DC | PRN

## 2019-03-13 MED ORDER — PREDNISONE 20 MG PO TABS: 20.0000 mg | ORAL_TABLET | Freq: Every day | ORAL | 0 refills | Status: DC

## 2019-03-13 NOTE — Progress Notes (Signed)
   Subjective:    Patient ID: Jaclyn Brooks, female    DOB: [birth year], 58 y.o.   MRN: [id number]  Patient presents for Follow-up      Pt here to f/u chronic medical problems   Has woken up last 2 nights felt dizzy headed, has been congested in nose still, using nasal spray, and allergy meds, no sinus tenderness   completed keflex from from April fo rUTI like symptoms as well    Has f/u imaging for nephrology next week for her CKD     HTN- taking atenolol and cardizem      COPD- no chest cold symtoms, using symbicort      7 lbs weight gain 6 months during quarantine    Review Of Systems:  GEN- denies fatigue, fever, weight loss,weakness, recent illness HEENT- denies eye drainage, change in vision,+ nasal discharge, CVS- denies chest pain, palpitations RESP- denies SOB, cough, wheeze ABD- denies N/V, change in stools, abd pain GU- denies dysuria, hematuria, dribbling, incontinence MSK- denies joint pain, muscle aches, injury Neuro- denies headache, dizziness, syncope, seizure activity       Objective:    BP 118/68   Pulse (!) 52   Temp 98.6 F (37 C)   Resp 18   Ht 5\' 6"  (1.676 m)   Wt 174 lb 6.4 oz (79.1 kg)   SpO2 99%   BMI 28.15 kg/m  GEN- NAD, alert and oriented x3 HEENT- PERRL, EOMI, non injected sclera, pink conjunctiva, MMM, oropharynx clear, nares - congested, enlarged turbinates, clear mucous seen, No maxillary sinus tenderness Neck- Supple, no thyromegaly, no LAD  CVS- RRR, systolic murmur RESP-CTAB ABD-NABS,soft,NT,ND EXT- No edema Pulses- Radial, DP- 2+        Assessment & Plan:      Problem List Items Addressed This Visit      Unprioritized   CKD (chronic kidney disease) stage 3, GFR 30-59 ml/min (HCC)    F/u NEPHROLOGY      Relevant Orders   Comprehensive metabolic panel (Completed)   COPD, mild (HCC)    Currently controlled, has remained off tobacco      Relevant Medications   predniSONE (DELTASONE) 20 MG tablet   Hypertension -  Primary    Controlled no changes       Relevant Orders   CBC with Differential/Platelet (Completed)   Comprehensive metabolic panel (Completed)   Lipid panel (Completed)    Other Visit Diagnoses    Nasal congestion       Due to HTNand CKD, avoid oral decongestant, will advise afrin nasal for 2-3 days,. prednisone 20mg  daily given for inflammation,then resume her allergy nasal spray Dizziness from more congestion No sign of overt infection  She did ask for nausea med when swallowing drainage   Dizziness          Note: This dictation was prepared with Dragon dictation along with smaller phrase technology. Any transcriptional errors that result from this process are unintentional.

## 2019-03-13 NOTE — Patient Instructions (Addendum)
F/U 4 months For physical

## 2019-03-14 ENCOUNTER — Encounter: Payer: Self-pay | Admitting: Family Medicine

## 2019-03-14 LAB — COMPREHENSIVE METABOLIC PANEL
AG Ratio: 1.1 (calc) (ref 1.0–2.5)
ALT: 10 U/L (ref 6–29)
AST: 13 U/L (ref 10–35)
Albumin: 3.9 g/dL (ref 3.6–5.1)
Alkaline phosphatase (APISO): 58 U/L (ref 37–153)
BUN/Creatinine Ratio: 19 (calc) (ref 6–22)
BUN: 32 mg/dL — ABNORMAL HIGH (ref 7–25)
CO2: 25 mmol/L (ref 20–32)
Calcium: 10.1 mg/dL (ref 8.6–10.4)
Chloride: 107 mmol/L (ref 98–110)
Creat: 1.65 mg/dL — ABNORMAL HIGH (ref 0.50–1.05)
Globulin: 3.4 g/dL (calc) (ref 1.9–3.7)
Glucose, Bld: 130 mg/dL — ABNORMAL HIGH (ref 65–99)
Potassium: 4 mmol/L (ref 3.5–5.3)
Sodium: 140 mmol/L (ref 135–146)
Total Bilirubin: 0.4 mg/dL (ref 0.2–1.2)
Total Protein: 7.3 g/dL (ref 6.1–8.1)

## 2019-03-14 LAB — CBC WITH DIFFERENTIAL/PLATELET
Absolute Monocytes: 541 cells/uL (ref 200–950)
Basophils Absolute: 19 cells/uL (ref 0–200)
Basophils Relative: 0.4 %
Eosinophils Absolute: 212 cells/uL (ref 15–500)
Eosinophils Relative: 4.5 %
HCT: 39.6 % (ref 35.0–45.0)
Hemoglobin: 13.5 g/dL (ref 11.7–15.5)
Lymphs Abs: 1636 cells/uL (ref 850–3900)
MCH: 33.9 pg — ABNORMAL HIGH (ref 27.0–33.0)
MCHC: 34.1 g/dL (ref 32.0–36.0)
MCV: 99.5 fL (ref 80.0–100.0)
MPV: 9.8 fL (ref 7.5–12.5)
Monocytes Relative: 11.5 %
Neutro Abs: 2294 cells/uL (ref 1500–7800)
Neutrophils Relative %: 48.8 %
Platelets: 270 10*3/uL (ref 140–400)
RBC: 3.98 10*6/uL (ref 3.80–5.10)
RDW: 12.9 % (ref 11.0–15.0)
Total Lymphocyte: 34.8 %
WBC: 4.7 10*3/uL (ref 3.8–10.8)

## 2019-03-14 LAB — LIPID PANEL
Cholesterol: 166 mg/dL (ref <200)
HDL: 30 mg/dL — ABNORMAL LOW (ref >=50)
LDL Cholesterol (Calc): 100 mg/dL (calc) — ABNORMAL HIGH
Non-HDL Cholesterol (Calc): 136 mg/dL (calc) — ABNORMAL HIGH (ref <130)
Total CHOL/HDL Ratio: 5.5 (calc) — ABNORMAL HIGH (ref <5.0)
Triglycerides: 238 mg/dL — ABNORMAL HIGH (ref <150)

## 2019-03-14 NOTE — Assessment & Plan Note (Signed)
Controlled no changes 

## 2019-03-14 NOTE — Assessment & Plan Note (Signed)
F/u NEPHROLOGY

## 2019-03-14 NOTE — Assessment & Plan Note (Signed)
Currently controlled, has remained off tobacco

## 2019-03-18 ENCOUNTER — Telehealth: Payer: Self-pay | Admitting: *Deleted

## 2019-03-18 NOTE — Telephone Encounter (Signed)
Call placed to patient. States that she will contact [place] and get itemized receipt to discuss med costs.   States that she is agreeable to trying prescription assistance via [facility]. Patient given information to apply to them.

## 2019-03-18 NOTE — Telephone Encounter (Signed)
-----   Message from Alycia Rossetti, MD sent at 03/18/2019  3:28 PM EDT ----- Regarding: RE: See if there is plavix OKAY to set this up if pt wants to participate  ----- Message ----- From: SixEden Lathe, LPN Sent: 11/13/4626   2:40 PM EDT To: Alycia Rossetti, MD Subject: RE: See if there is plavix                     There are no prescription assistance plans for Plavix as it is generic.   Cash price for Plavix is around $15/ 30-day supply.   We can try the community partnership if she is willing.  ----- Message ----- From: Alycia Rossetti, MD Sent: 03/18/2019   2:23 PM EDT To: Eden Lathe Six, LPN Subject: RE: See if there is plavix                     Pt needs pt assistance for Plavix  Is there an application for this  ----- Message ----- From: Sheral Flow, LPN Sent: 03/12/8176  11:65 AM EDT To: Alycia Rossetti, MD Subject: RE: See if there is plavix                     Please clarify??? ----- Message ----- From: Alycia Rossetti, MD Sent: 03/13/2019   9:35 AM EDT To: Eden Lathe Six, LPN Subject: See if there is plavix

## 2019-03-20 ENCOUNTER — Ambulatory Visit
Admission: RE | Admit: 2019-03-20 | Discharge: 2019-03-20 | Disposition: A | Discharge status: Home / Self Care | Payer: Medicare Other | Source: Ambulatory Visit | Attending: Nephrology | Admitting: Nephrology

## 2019-03-20 DIAGNOSIS — N2581 Secondary hyperparathyroidism of renal origin: Secondary | ICD-10-CM

## 2019-03-20 DIAGNOSIS — J449 Chronic obstructive pulmonary disease, unspecified: Secondary | ICD-10-CM

## 2019-03-20 DIAGNOSIS — N261 Atrophy of kidney (terminal): Secondary | ICD-10-CM | POA: Diagnosis not present

## 2019-03-20 DIAGNOSIS — D631 Anemia in chronic kidney disease: Secondary | ICD-10-CM

## 2019-03-20 DIAGNOSIS — I358 Other nonrheumatic aortic valve disorders: Secondary | ICD-10-CM

## 2019-03-20 DIAGNOSIS — N183 Chronic kidney disease, stage 3 unspecified: Secondary | ICD-10-CM

## 2019-03-20 DIAGNOSIS — I129 Hypertensive chronic kidney disease with stage 1 through stage 4 chronic kidney disease, or unspecified chronic kidney disease: Secondary | ICD-10-CM

## 2019-04-20 IMAGING — DX DG CHEST 2V
2 series · 2 of 2 positions shown · non-contrast
Comparison: None.

CLINICAL DATA: Nonproductive cough with wheezing, shortness of
breath and low grade fever for 1 week. Current smoker.

EXAM:
CHEST  2 VIEW

[chest pa]
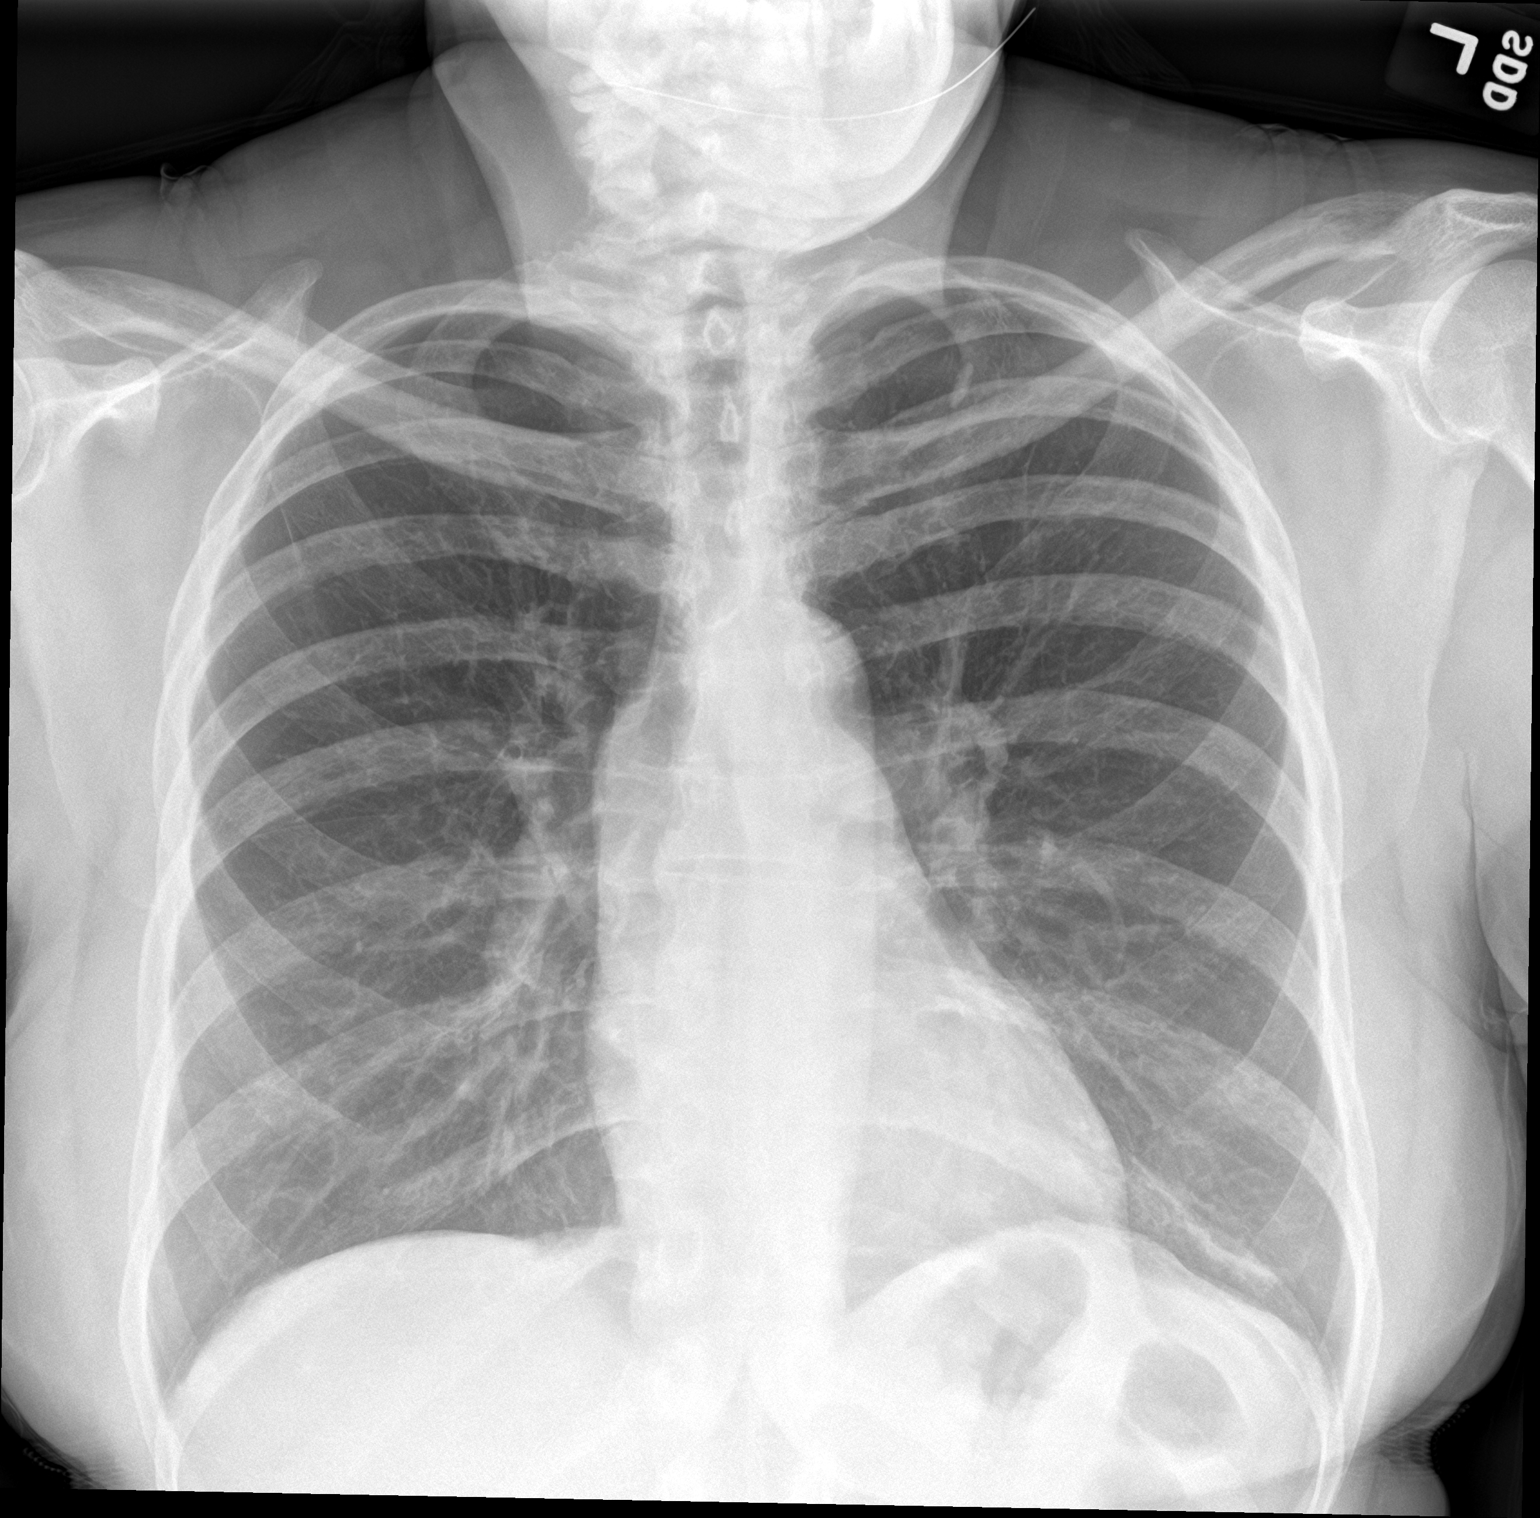

[chest lat]
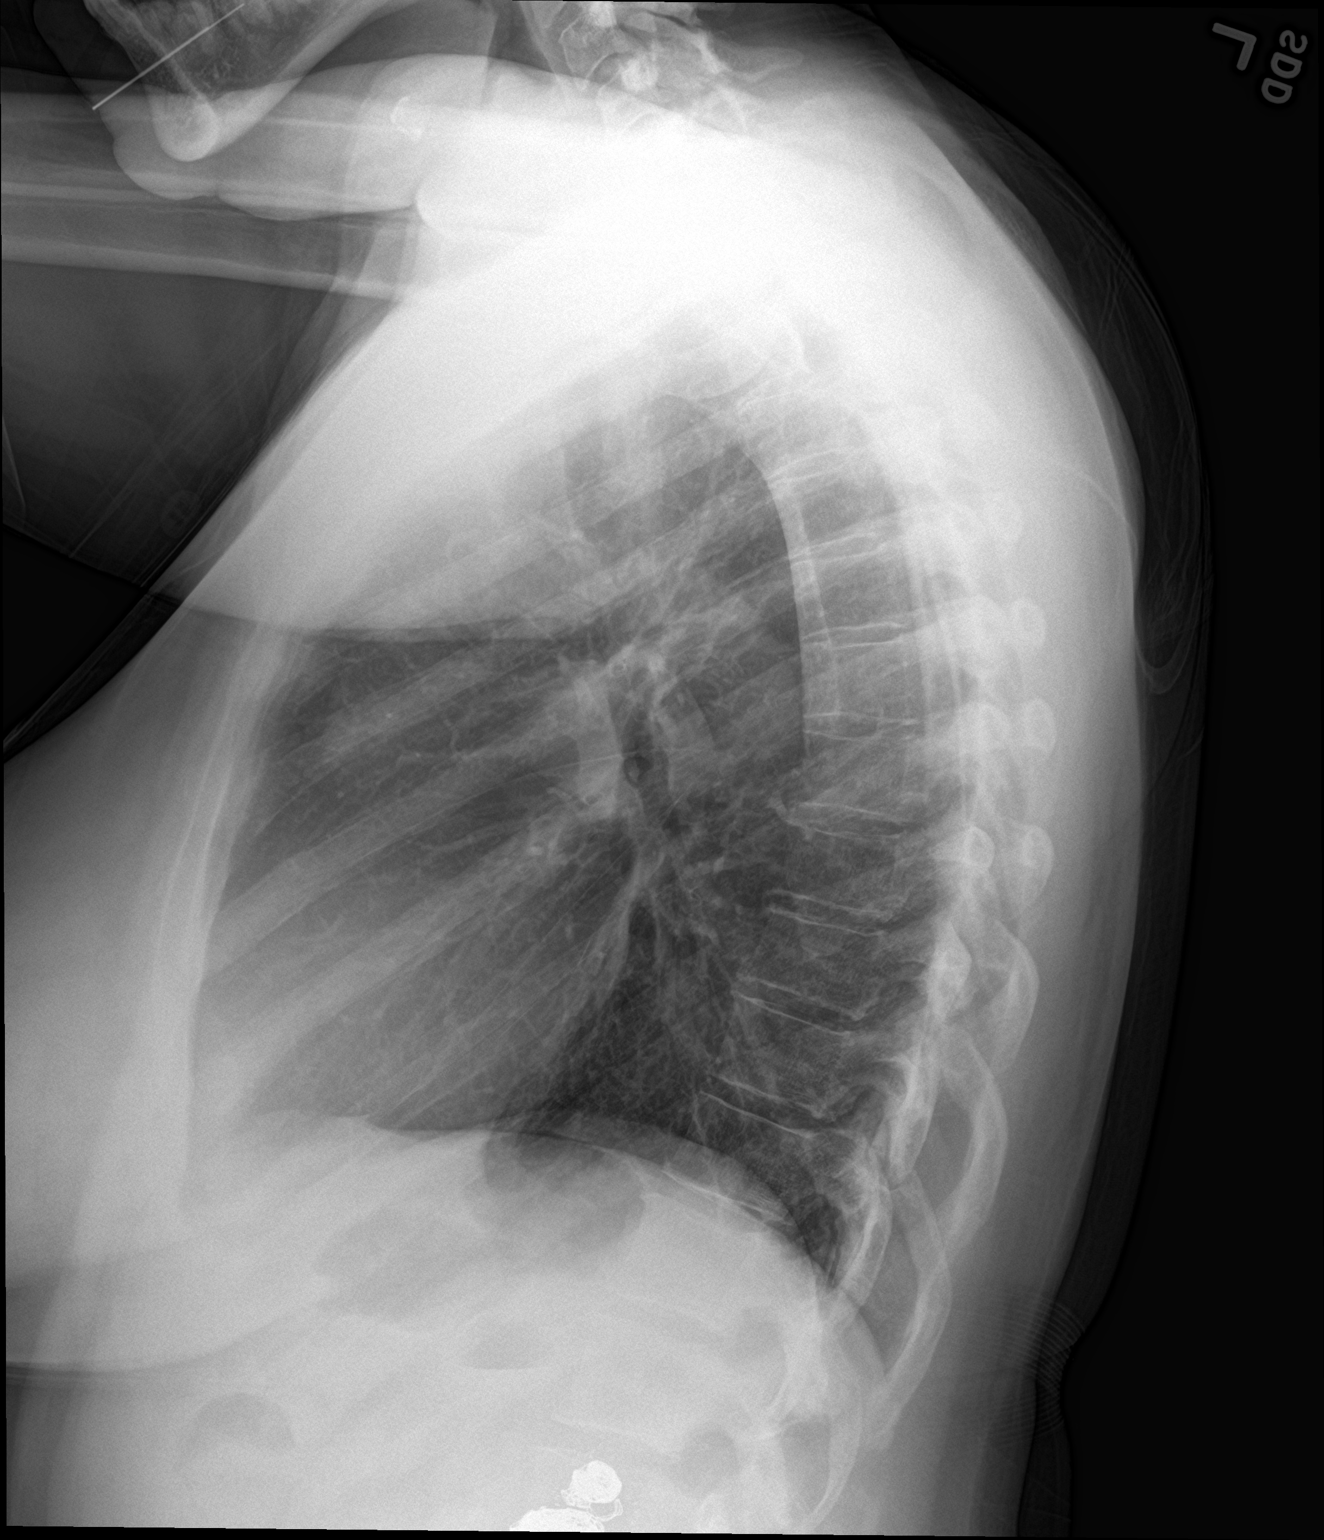

[2 of 2 positions shown; findings below may reference images not displayed]

FINDINGS: The heart size and mediastinal contours are normal. The lungs are
clear aside from linear density at the left lung base on the frontal
examination, likely scarring or atelectasis. There is no pleural
effusion or pneumothorax. The bones appear unremarkable. Metallic
density overlaps the upper lumbar spine on the lateral view, not
seen on the frontal examination. These may represent embolization
coils as correlated with report from lumbar radiographs
01/27/2016-unavailable.
IMPRESSION: No active cardiopulmonary process.

## 2019-05-09 ENCOUNTER — Other Ambulatory Visit: Payer: Self-pay

## 2019-05-10 ENCOUNTER — Encounter: Payer: Self-pay | Admitting: Family Medicine

## 2019-05-10 ENCOUNTER — Ambulatory Visit (INDEPENDENT_AMBULATORY_CARE_PROVIDER_SITE_OTHER): Payer: Medicare Other | Admitting: Family Medicine

## 2019-05-10 VITALS — BP 122/64 | HR 58 | Temp 98.4°F | Resp 14 | Ht 66.0 in | Wt 175.0 lb

## 2019-05-10 DIAGNOSIS — M7711 Lateral epicondylitis, right elbow: Secondary | ICD-10-CM

## 2019-05-10 DIAGNOSIS — I6523 Occlusion and stenosis of bilateral carotid arteries: Secondary | ICD-10-CM

## 2019-05-10 DIAGNOSIS — N183 Chronic kidney disease, stage 3 unspecified: Secondary | ICD-10-CM

## 2019-05-10 DIAGNOSIS — Z905 Acquired absence of kidney: Secondary | ICD-10-CM

## 2019-05-10 DIAGNOSIS — M7021 Olecranon bursitis, right elbow: Secondary | ICD-10-CM

## 2019-05-10 NOTE — Progress Notes (Signed)
Office Visit Note   Patient: Jaclyn Brooks           Date of Birth: 1960/10/27           MRN: [id number] Visit Date: 05/10/2019 Requested by: Alycia Rossetti, MD [address],  [place] [address] PCP: Alycia Rossetti, MD  Subjective: Chief Complaint  Patient presents with  . right elbow olecranon bursitis    HPI: She is here with right elbow swelling.  Symptoms started about a week or 2 ago, no injury.  She has a history of olecranon bursitis treated with aspiration in the past.  It flares up occasionally.  She has not taken any medications for this.  It was slightly warm to the touch at first but not now.  She also has a history of gout but this does not feel like a gout attack to her.  She has a solitary kidney and tries to avoid taking any NSAIDs.              ROS: Denies fevers or chills.  All other systems were reviewed and are negative.  Objective: Vital Signs: There were no vitals taken for this visit.  Physical Exam:  General:  Alert and oriented, in no acute distress. Pulm:  Breathing unlabored. Psy:  Normal mood, congruent affect. Skin: No warmth or erythema, no rash. Right elbow: Full range of motion, no joint effusion.  She has 1+ aseptic olecranon bursitis.  Minimally tender.  No pain with elbow extension against resistance.   Imaging: None today.  Assessment & Plan: 1.  Right elbow aseptic olecranon bursitis -Discussed various options with her and she wants it aspirated and injected today.  Follow-up as needed.     Procedures: Right elbow olecranon bursa aspiration and injection: After sterile prep with Betadine, injected 3 cc 1% lidocaine without epinephrine, then aspirated 7 cc serous synovial fluid, then injected 20 mg methylprednisolone.    PMFS History: Patient Active Problem List   Diagnosis Date Noted  . Solitary kidney, acquired 05/10/2019  . Carotid arterial disease ([place]) 12/11/2018  . COPD, mild ([place]) 10/01/2018  . CKD  (chronic kidney disease) stage 3, GFR 30-59 ml/min (HCC) 10/01/2018  . Aortic valve sclerosis 10/01/2018  . Hypertension 09/11/2018   Past Medical History:  Diagnosis Date  . Allergy   . Depression   . Gout   . Hypertension   . 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 Hypertension Mother   . Heart disease Mother        Cardiac Arrest   . Hypertension Father   . Hypertension Sister   . Cancer Brother        Bladder Cancer   . Hypertension Brother     Past Surgical History:  Procedure Laterality Date  . ABDOMINAL HYSTERECTOMY    . CHOLECYSTECTOMY N/A 01/28/2015   Procedure: LAPAROSCOPIC CHOLECYSTECTOMY;  Surgeon: Aviva Signs Md, MD;  Location: [facility];  Service: General;  Laterality: N/A;  . excision of bone spurs Bilateral    Big toes  . renal aneurysm Right    right coil-and only has function of left kidney  . X-STOP IMPLANTATION     Social History   Occupational History  . Occupation: Retired  Tobacco Use  . Smoking status: Former Smoker    Packs/day: 0.25    Years: 20.00    Pack years: 5.00    Types: Cigarettes  .  Smokeless tobacco: Never Used  Substance and Sexual Activity  . Alcohol use: No  . Drug use: No  . Sexual activity: Yes    Birth control/protection: Surgical

## 2019-05-10 NOTE — Progress Notes (Signed)
   Subjective:    Patient ID: Jaclyn Brooks, female    DOB: 11/03/1960, 58 y.o.   MRN: [id number]  Patient presents for R Elbow Effusion (x3 days- fluid to elbow- pain with ROM)   Right elbow swelling for past few days .  She does have history of bursitis in the elbow.  She does not recall any particular injury.  She does rest her elbow on her console.  She has not noted any drainage but has noted a little warmth to the area.  She has not used any ice.  She does occasionally take diclofenac but cannot use daily NSAIDs or high-dose because of her chronic kidney disease she only has 1 functioning kidney. He is also noted some pain on the lateral aspect of her elbow   Review Of Systems:  GEN- denies fatigue, fever, weight loss,weakness, recent illness HEENT- denies eye drainage, change in vision, nasal discharge, CVS- denies chest pain, palpitations RESP- denies SOB, cough, wheeze MSK- + joint pain, muscle aches, injury Neuro- denies headache, dizziness, syncope, seizure activity       Objective:    BP 122/64   Pulse (!) 58   Temp 98.4 F (36.9 C) (Oral)   Resp 14   Ht 5\' 6"  (1.676 m)   Wt 175 lb (79.4 kg)   SpO2 97%   BMI 28.25 kg/m  GEN- NAD, alert and oriented x3 MSK- Right elbow swelling over olecranon, mild warmth, no erythema, TTP, TTP along lateral epidcondyle pain with rotation at elbow against resistance, mild TTP forearm but no swelling  Normal inspection of left arm/elbow FROM   Shoulder in tact bilat  Pulses- Radial 2+        Assessment & Plan:      Problem List Items Addressed This Visit      Unprioritized   CKD (chronic kidney disease) stage 3, GFR 30-59 ml/min (HCC)   Solitary kidney, acquired    Other Visit Diagnoses    Olecranon bursitis of right elbow    -  Primary   Send to orthopedics to have drained, in setting of her CKD safer to not use daily NSAID, can also ICE. elevate   Relevant Orders   Ambulatory referral to Orthopedic Surgery   Right  tennis elbow       Relevant Orders   Ambulatory referral to Orthopedic Surgery      Note: This dictation was prepared with Dragon dictation along with smaller phrase technology. Any transcriptional errors that result from this process are unintentional.

## 2019-05-10 NOTE — Patient Instructions (Signed)
Referral to orthopedics  F/U as previous  

## 2019-05-31 ENCOUNTER — Encounter: Payer: Self-pay | Admitting: Family Medicine

## 2019-05-31 ENCOUNTER — Ambulatory Visit (INDEPENDENT_AMBULATORY_CARE_PROVIDER_SITE_OTHER): Payer: Medicare Other | Admitting: Family Medicine

## 2019-05-31 ENCOUNTER — Other Ambulatory Visit: Payer: Self-pay

## 2019-05-31 VITALS — BP 126/62 | HR 70 | Temp 98.9°F | Resp 14 | Ht 66.0 in | Wt 176.0 lb

## 2019-05-31 DIAGNOSIS — I6523 Occlusion and stenosis of bilateral carotid arteries: Secondary | ICD-10-CM

## 2019-05-31 DIAGNOSIS — N760 Acute vaginitis: Secondary | ICD-10-CM

## 2019-05-31 DIAGNOSIS — R300 Dysuria: Secondary | ICD-10-CM

## 2019-05-31 DIAGNOSIS — K219 Gastro-esophageal reflux disease without esophagitis: Secondary | ICD-10-CM | POA: Diagnosis not present

## 2019-05-31 LAB — URINALYSIS, ROUTINE W REFLEX MICROSCOPIC
Bacteria, UA: NONE SEEN /HPF
Bilirubin Urine: NEGATIVE
Glucose, UA: NEGATIVE
Hgb urine dipstick: NEGATIVE
Hyaline Cast: NONE SEEN /LPF
Leukocytes,Ua: NEGATIVE
Nitrite: NEGATIVE
RBC / HPF: NONE SEEN /HPF (ref 0–2)
Specific Gravity, Urine: 1.02 (ref 1.001–1.03)
pH: 5 (ref 5.0–8.0)

## 2019-05-31 LAB — MICROSCOPIC MESSAGE

## 2019-05-31 LAB — WET PREP FOR TRICH, YEAST, CLUE

## 2019-05-31 MED ORDER — PANTOPRAZOLE SODIUM 40 MG PO TBEC: 40.0000 mg | DELAYED_RELEASE_TABLET | Freq: Every day | ORAL | 3 refills | Status: DC

## 2019-05-31 NOTE — Progress Notes (Signed)
   Subjective:    Patient ID: Jaclyn Brooks, female    DOB: 04-13-1961, 58 y.o.   MRN: [id number]  Patient presents for GERD (frequent bloating and nausea ) and Vaginitis (irritation )   Pt here with nausea, epigastric discomfort on and off , but worse past few weeks. Has been taking pepcid and pepto bismol .  Does admit that she has been under stress recently.  Thinks that this may be contributing.  She is worried about having an STD at her age which is her main reason for being here.  She does have some vaginal itching and irritation along with mild discharge.  She has one partner.  Denies any vaginal bleeding.  She did have a little dysuria. She did use Monistat yesterday which has helped  She does admit however that drinking soda and acidic things to upset her stomach.  She has not been taking any NSAIDs with her known chronic kidney disease.  She was also wondering if she needs to see vascular she does have history of bilat  renal artery aneurysm which she had coiling performed many years ago on the right renal artery     Review Of Systems:  GEN- denies fatigue, fever, weight loss,weakness, recent illness HEENT- denies eye drainage, change in vision, nasal discharge, CVS- denies chest pain, palpitations RESP- denies SOB, cough, wheeze ABD- denies N/V, change in stools, abd pain GU- denies dysuria, hematuria, dribbling, incontinence MSK- denies joint pain, muscle aches, injury Neuro- denies headache, dizziness, syncope, seizure activity       Objective:    BP 126/62   Pulse 70   Temp 98.9 F (37.2 C) (Oral)   Resp 14   Ht 5\' 6"  (1.676 m)   Wt 176 lb (79.8 kg)   SpO2 98%   BMI 28.41 kg/m  GEN- NAD, alert and oriented x3 HEENT- PERRL, EOMI, non injected sclera, pink conjunctiva, MMM, oropharynx clear CVS- RRR, no murmur RESP-CTAB ABD-NABS,soft, mild TTP epigastric region,ND, no CVA tenderness GU- normal external genitalia, vaginal mucosa pink and moist, cervix  visualized no growth, white discharge, , no ovarian masses, uterus absent EXT- No edema Pulses- Radial  2+        Assessment & Plan:      Problem List Items Addressed This Visit      Unprioritized   GERD (gastroesophageal reflux disease)    Trial of protonix once a day for reflux Next step GI       Relevant Medications   pantoprazole (PROTONIX) 40 MG tablet    Other Visit Diagnoses    Acute vaginitis    -  Primary   Wet prep unremarkble, may have self treated the yeast. STD check for GC pending, declines H IV testing    Relevant Orders   C. trachomatis/N. gonorrhoeae RNA   WET PREP FOR [place], YEAST, CLUE (Completed)   Dysuria       UA negative for infection   Relevant Orders   Urinalysis, Routine w reflex microscopic (Completed)      Note: This dictation was prepared with Dragon dictation along with smaller phrase technology. Any transcriptional errors that result from this process are unintentional.

## 2019-05-31 NOTE — Patient Instructions (Addendum)
Try the protonix  once a day for reflux We will call with results next week  I will check on angiogram for renal arteries F/U pending results

## 2019-05-31 NOTE — Assessment & Plan Note (Signed)
Trial of protonix once a day for reflux Next step GI

## 2019-06-01 LAB — C. TRACHOMATIS/N. GONORRHOEAE RNA
C. trachomatis RNA, TMA: NOT DETECTED
N. gonorrhoeae RNA, TMA: NOT DETECTED

## 2019-06-12 ENCOUNTER — Telehealth: Payer: Self-pay | Admitting: Family Medicine

## 2019-06-12 DIAGNOSIS — I722 Aneurysm of renal artery: Secondary | ICD-10-CM

## 2019-06-12 NOTE — Telephone Encounter (Signed)
Spoke with patient and informed her that Dr. Buelah Manis spoke with Dr. Justin Mend and they have decided to refer her to Vascular for renal aneurysms. Patient verbalized understanding.

## 2019-06-12 NOTE — Telephone Encounter (Signed)
Please call and let patient know that I spoke with Dr. Justin Mend her nephrologist.  We decided that it would be best if we had vascular follow the renal aneurysms and they can correlate with nephrology as needed and myself.  I have placed a referral for vascular surgery.

## 2019-07-10 ENCOUNTER — Encounter: Payer: Medicare Other | Admitting: Vascular Surgery

## 2019-07-12 ENCOUNTER — Other Ambulatory Visit: Payer: Self-pay

## 2019-07-15 ENCOUNTER — Ambulatory Visit: Payer: Medicare Other | Admitting: Family Medicine

## 2019-07-16 ENCOUNTER — Other Ambulatory Visit: Payer: Self-pay

## 2019-07-17 ENCOUNTER — Ambulatory Visit: Payer: Medicare Other | Admitting: Family Medicine

## 2019-07-31 ENCOUNTER — Encounter: Payer: Medicare Other | Admitting: Vascular Surgery

## 2019-08-09 ENCOUNTER — Ambulatory Visit (INDEPENDENT_AMBULATORY_CARE_PROVIDER_SITE_OTHER): Payer: Medicare Other | Admitting: Family Medicine

## 2019-08-09 ENCOUNTER — Encounter: Payer: Self-pay | Admitting: Family Medicine

## 2019-08-09 ENCOUNTER — Other Ambulatory Visit: Payer: Self-pay

## 2019-08-09 VITALS — BP 128/64 | HR 64 | Temp 98.3°F | Resp 14 | Ht 66.0 in | Wt 185.0 lb

## 2019-08-09 DIAGNOSIS — J449 Chronic obstructive pulmonary disease, unspecified: Secondary | ICD-10-CM

## 2019-08-09 DIAGNOSIS — Z1159 Encounter for screening for other viral diseases: Secondary | ICD-10-CM | POA: Diagnosis not present

## 2019-08-09 DIAGNOSIS — Z1231 Encounter for screening mammogram for malignant neoplasm of breast: Secondary | ICD-10-CM | POA: Diagnosis not present

## 2019-08-09 DIAGNOSIS — Z0001 Encounter for general adult medical examination with abnormal findings: Secondary | ICD-10-CM

## 2019-08-09 DIAGNOSIS — I10 Essential (primary) hypertension: Secondary | ICD-10-CM | POA: Diagnosis not present

## 2019-08-09 DIAGNOSIS — N1832 Chronic kidney disease, stage 3b: Secondary | ICD-10-CM

## 2019-08-09 DIAGNOSIS — Z1211 Encounter for screening for malignant neoplasm of colon: Secondary | ICD-10-CM

## 2019-08-09 DIAGNOSIS — R739 Hyperglycemia, unspecified: Secondary | ICD-10-CM

## 2019-08-09 DIAGNOSIS — I6523 Occlusion and stenosis of bilateral carotid arteries: Secondary | ICD-10-CM

## 2019-08-09 DIAGNOSIS — Z0000 Encounter for general adult medical examination without abnormal findings: Secondary | ICD-10-CM

## 2019-08-09 DIAGNOSIS — Z114 Encounter for screening for human immunodeficiency virus [HIV]: Secondary | ICD-10-CM

## 2019-08-09 NOTE — Patient Instructions (Signed)
Referral for colonoscopy Schedule your mammogram  We will call with lab results  Work on dietary changes  F/U 4 months

## 2019-08-09 NOTE — Progress Notes (Signed)
Subjective:   Patient presents for Medicare Annual/Subsequent preventive examination.    Pt here for physical  Meds reviewed   Has vasular appt for November   She has gained 10 pounds since her last visit.  Admits that she drinks a lot of [facility] and states when she feels like her sugar is dropping she would just eat a spoonful of regular table sugar.  She does not exercise.  She has carotid artery disease I have recommended she start Lipitor states that she took the first prescription but then just stopped taking it no particular reason.  States that she does not want to have to take any more pills  COPD she has not had any recent problems breathing but she did lose her Symbicort inhaler is unable to afford another one right now.  She is signing up for a new part B plan does have an albuterol at home. Review Past Medical/Family/Social: Per EMR    Risk Factors  Current exercise habits: None Dietary issues discussed: Yes  Cardiac risk factors: HTN, carotid artery disease, kidney disease  Depression Screen  (Note: if answer to either of the following is "Yes", a more complete depression screening is indicated)  Over the past two weeks, have you felt down, depressed or hopeless? No Over the past two weeks, have you felt little interest or pleasure in doing things? No Have you lost interest or pleasure in daily life? No Do you often feel hopeless? No Do you cry easily over simple problems? No   Activities of Daily Living  In your present state of health, do you have any difficulty performing the following activities?:  Driving? No  Managing money? No  Feeding yourself? No  Getting from bed to chair? No  Climbing a flight of stairs? No  Preparing food and eating?: No  Bathing or showering? No  Getting dressed: No  Getting to the toilet? No  Using the toilet:No  Moving around from place to place: No  In the past year have you fallen or had a near fall?:No  Are you sexually  active? No  Do you have more than one partner? No   Hearing Difficulties: No  Do you often ask people to speak up or repeat themselves? No  Do you experience ringing or noises in your ears? No Do you have difficulty understanding soft or whispered voices? No  Do you feel that you have a problem with memory? No Do you often misplace items? No  Do you feel safe at home? Yes  Cognitive Testing  Alert? Yes Normal Appearance?Yes  Oriented to person? Yes Place? Yes  Time? Yes  Recall of three objects? Yes  Can perform simple calculations? Yes  Displays appropriate judgment?Yes  Can read the correct time from a watch face?Yes   List the Names of Other Physician/Practitioners you currently use:   Nephrology  Vascular    Screening Tests / Date Colonoscopy    DUE                  Zostavax declines Hep C screening- Due  S/P hystrectomy  Mammogram - DUE  Influenza Vaccine Clines Tetanus/tdap declines  ROS: GEN- denies fatigue, fever, weight loss,weakness, recent illness HEENT- denies eye drainage, change in vision, nasal discharge, CVS- denies chest pain, palpitations RESP- denies SOB, cough, wheeze ABD- denies N/V, change in stools, abd pain GU- denies dysuria, hematuria, dribbling, incontinence MSK- denies joint pain, muscle aches, injury Neuro- denies headache, dizziness, syncope, seizure activity  GEN- NAD, alert and oriented x3 HEENT- PERRL, EOMI, non injected sclera, pink conjunctiva, MMM, oropharynx clear Neck- Supple, no thryomegaly, + bruit CVS- RRR, no murmur RESP-CTAB EXT- No edema Pulses- Radial, DP- 2+   Assessment:    Annual wellness medicare exam   Plan:    During the course of the visit the patient was educated and counseled about appropriate screening and preventive services including:  Screening mammography patient to schedule Colorectal cancer screening referral made  Patient declines immunizations. Carotid artery disease.  Recommend she take the  statin drug she declines at this time.  I am checking her lipid panel today.  We discussed her diet to increase sugar that she has.  I think that she crashes from having too much sugar and carbs and not enough protein and fiber veggies.  Recommend that she get herself off of the soda first.  I am going to check her A1c.   COPD I gave her a sample of Breo 244mcg (all she can take 1 puff every other day.  We only had 1 left.  Hep C and HIV screening to be done.    Overweight per above dietary changes  Diet review for nutrition referral? Yes ____ Not Indicated __x__  Patient Instructions (the written plan) was given to the patient.  Medicare Attestation  I have personally reviewed:  The patient's medical and social history  Their use of alcohol, tobacco or illicit drugs  Their current medications and supplements  The patient's functional ability including ADLs,fall risks, home safety risks, cognitive, and hearing and visual impairment  Diet and physical activities  Evidence for depression or mood disorders  The patient's weight, height, BMI, and visual acuity have been recorded in the chart. I have made referrals, counseling, and provided education to the patient based on review of the above and I have provided the patient with a written personalized care plan for preventive services.

## 2019-08-12 ENCOUNTER — Encounter (INDEPENDENT_AMBULATORY_CARE_PROVIDER_SITE_OTHER): Payer: Self-pay | Admitting: *Deleted

## 2019-08-12 LAB — HEMOGLOBIN A1C
Hgb A1c MFr Bld: 5.9 % of total Hgb — ABNORMAL HIGH (ref <5.7)
Mean Plasma Glucose: 123 (calc)
eAG (mmol/L): 6.8 (calc)

## 2019-08-12 LAB — LIPID PANEL
Cholesterol: 166 mg/dL (ref <200)
HDL: 28 mg/dL — ABNORMAL LOW (ref >=50)
LDL Cholesterol (Calc): 104 mg/dL (calc) — ABNORMAL HIGH
Non-HDL Cholesterol (Calc): 138 mg/dL (calc) — ABNORMAL HIGH (ref <130)
Total CHOL/HDL Ratio: 5.9 (calc) — ABNORMAL HIGH (ref <5.0)
Triglycerides: 220 mg/dL — ABNORMAL HIGH (ref <150)

## 2019-08-12 LAB — CBC WITH DIFFERENTIAL/PLATELET
Absolute Monocytes: 561 cells/uL (ref 200–950)
Basophils Absolute: 22 cells/uL (ref 0–200)
Basophils Relative: 0.4 %
Eosinophils Absolute: 182 cells/uL (ref 15–500)
Eosinophils Relative: 3.3 %
HCT: 39.8 % (ref 35.0–45.0)
Hemoglobin: 13.5 g/dL (ref 11.7–15.5)
Lymphs Abs: 2107 cells/uL (ref 850–3900)
MCH: 33.8 pg — ABNORMAL HIGH (ref 27.0–33.0)
MCHC: 33.9 g/dL (ref 32.0–36.0)
MCV: 99.5 fL (ref 80.0–100.0)
MPV: 10.4 fL (ref 7.5–12.5)
Monocytes Relative: 10.2 %
Neutro Abs: 2629 cells/uL (ref 1500–7800)
Neutrophils Relative %: 47.8 %
Platelets: 286 10*3/uL (ref 140–400)
RBC: 4 10*6/uL (ref 3.80–5.10)
RDW: 12.8 % (ref 11.0–15.0)
Total Lymphocyte: 38.3 %
WBC: 5.5 10*3/uL (ref 3.8–10.8)

## 2019-08-12 LAB — COMPREHENSIVE METABOLIC PANEL
AG Ratio: 1.3 (calc) (ref 1.0–2.5)
ALT: 14 U/L (ref 6–29)
AST: 12 U/L (ref 10–35)
Albumin: 4.1 g/dL (ref 3.6–5.1)
Alkaline phosphatase (APISO): 57 U/L (ref 37–153)
BUN/Creatinine Ratio: 14 (calc) (ref 6–22)
BUN: 25 mg/dL (ref 7–25)
CO2: 22 mmol/L (ref 20–32)
Calcium: 9.8 mg/dL (ref 8.6–10.4)
Chloride: 104 mmol/L (ref 98–110)
Creat: 1.73 mg/dL — ABNORMAL HIGH (ref 0.50–0.99)
Globulin: 3.1 g/dL (calc) (ref 1.9–3.7)
Glucose, Bld: 162 mg/dL — ABNORMAL HIGH (ref 65–99)
Potassium: 3.8 mmol/L (ref 3.5–5.3)
Sodium: 139 mmol/L (ref 135–146)
Total Bilirubin: 0.5 mg/dL (ref 0.2–1.2)
Total Protein: 7.2 g/dL (ref 6.1–8.1)

## 2019-08-12 LAB — HEPATITIS C ANTIBODY
Hepatitis C Ab: NONREACTIVE
SIGNAL TO CUT-OFF: 0.04 (ref <1.00)

## 2019-08-12 LAB — HIV ANTIBODY (ROUTINE TESTING W REFLEX): HIV 1&2 Ab, 4th Generation: NONREACTIVE

## 2019-08-13 ENCOUNTER — Other Ambulatory Visit: Payer: Self-pay

## 2019-08-13 MED ORDER — ATORVASTATIN CALCIUM 20 MG PO TABS: 20.0000 mg | ORAL_TABLET | Freq: Every day | ORAL | 0 refills | Status: DC

## 2019-09-04 ENCOUNTER — Encounter: Payer: Medicare Other | Admitting: Vascular Surgery

## 2019-09-04 ENCOUNTER — Encounter: Payer: Self-pay | Admitting: Family

## 2019-09-12 ENCOUNTER — Ambulatory Visit (HOSPITAL_COMMUNITY): Payer: Medicare Other

## 2019-10-09 ENCOUNTER — Other Ambulatory Visit: Payer: Self-pay | Admitting: Family Medicine

## 2019-10-21 ENCOUNTER — Other Ambulatory Visit: Payer: Self-pay

## 2019-10-21 ENCOUNTER — Ambulatory Visit (HOSPITAL_COMMUNITY): Payer: Medicare Other

## 2019-10-21 ENCOUNTER — Encounter: Payer: Self-pay | Admitting: Family Medicine

## 2019-10-21 ENCOUNTER — Ambulatory Visit (INDEPENDENT_AMBULATORY_CARE_PROVIDER_SITE_OTHER): Payer: Medicare Other | Admitting: Family Medicine

## 2019-10-21 VITALS — BP 128/68 | HR 82 | Temp 98.1°F | Resp 14 | Ht 66.0 in | Wt 186.0 lb

## 2019-10-21 DIAGNOSIS — L02411 Cutaneous abscess of right axilla: Secondary | ICD-10-CM | POA: Diagnosis not present

## 2019-10-21 MED ORDER — HYDROCODONE-ACETAMINOPHEN 5-325 MG PO TABS: 1.0000 | ORAL_TABLET | Freq: Four times a day (QID) | ORAL | 0 refills | Status: DC | PRN

## 2019-10-21 MED ORDER — SULFAMETHOXAZOLE-TRIMETHOPRIM 800-160 MG PO TABS: 1.0000 | ORAL_TABLET | Freq: Two times a day (BID) | ORAL | 0 refills | Status: DC

## 2019-10-21 NOTE — Patient Instructions (Signed)
F/U as previous   hydrocodone sent for pain

## 2019-10-21 NOTE — Progress Notes (Signed)
   Subjective:    Patient ID: Jaclyn Brooks, female    DOB: [birth year], 59 y.o.   MRN: [id number]  Patient presents for Abscess (x1 week- R axilla- draining, painful)   Pt here with abscess of right axilla for the past week.  She does have history of boils and abscess.  She has been using peroxide to the area.  The pain that worsened over the past couple of days.  She has not had any fever chills body aches.   Review Of Systems:  GEN- denies fatigue, fever, weight loss,weakness, recent illness HEENT- denies eye drainage, change in vision, nasal discharge, CVS- denies chest pain, palpitations RESP- denies SOB, cough, wheeze ABD- denies N/V, change in stools, abd pain GU- denies dysuria, hematuria, dribbling, incontinence MSK- denies joint pain, muscle aches, injury Neuro- denies headache, dizziness, syncope, seizure activity       Objective:    BP 128/68   Pulse 82   Temp 98.1 F (36.7 C) (Temporal)   Resp 14   Ht 5\' 6"  (1.676 m)   Wt 186 lb (84.4 kg)   SpO2 98%   BMI 30.02 kg/m  GEN- NAD, alert and oriented x3 Skin right axilla 1 x 1cm boil in the right axilla tender to palpation draining pus, mild induration No appreciable erythema Procedure- Incision and Drainage Procedure explained to patient questions answered benefits and risks discussed verbal consent obtained. Antiseptic-Betadine Anesthesia-lidocaine 1% no epi Small Incision performed through the pinpoint draining lesion Culture taken Moderate  amount of pus expressed Minimal blood loss Patient tolerated procedure well Bandage applied         Assessment & Plan:      Problem List Items Addressed This Visit    None    Visit Diagnoses    Abscess of right axilla    -  Primary   s/p I and D of abscess, pus expressed culture taken, not deep enough for packing, given bactrim BID, norco #10 tab, warm compresses, antibiotic soap to be used    Relevant Orders   WOUND CULTURE      Note: This dictation was  prepared with Dragon dictation along with smaller phrase technology. Any transcriptional errors that result from this process are unintentional.

## 2019-10-22 ENCOUNTER — Telehealth: Payer: Self-pay | Admitting: *Deleted

## 2019-10-22 NOTE — Telephone Encounter (Signed)
Received call from patient.   Reports that she had reaction to medication last night, but is unsure if it was ABTx or pain medication. Reports that she was fatigued, nauseated and had severe HA.   States that she has stopped taking Hydrocodone/APAP, and is only taking (1/2) tab of Bactrim QID.   Advised to take ABTx as directed since she has stopped pain medication. Contact office if Sx persist.

## 2019-10-22 NOTE — Telephone Encounter (Signed)
Call placed to patient and patient made aware.  

## 2019-10-22 NOTE — Telephone Encounter (Signed)
Recommend she continue antibiotic Stop the pain medication

## 2019-10-24 LAB — WOUND CULTURE
MICRO NUMBER:: 10028082
SPECIMEN QUALITY:: ADEQUATE

## 2019-10-29 ENCOUNTER — Encounter: Payer: Self-pay | Admitting: Family Medicine

## 2019-11-08 ENCOUNTER — Encounter: Payer: Self-pay | Admitting: Family Medicine

## 2019-11-08 ENCOUNTER — Other Ambulatory Visit: Payer: Self-pay | Admitting: Family Medicine

## 2019-11-08 ENCOUNTER — Other Ambulatory Visit: Payer: Self-pay

## 2019-11-08 ENCOUNTER — Telehealth: Payer: Self-pay | Admitting: *Deleted

## 2019-11-08 ENCOUNTER — Ambulatory Visit (INDEPENDENT_AMBULATORY_CARE_PROVIDER_SITE_OTHER): Payer: Medicare Other | Admitting: Family Medicine

## 2019-11-08 VITALS — BP 128/80 | HR 56 | Temp 98.0°F | Resp 16 | Ht 66.0 in | Wt 185.0 lb

## 2019-11-08 DIAGNOSIS — L02411 Cutaneous abscess of right axilla: Secondary | ICD-10-CM | POA: Diagnosis not present

## 2019-11-08 MED ORDER — DOXYCYCLINE HYCLATE 100 MG PO TABS: 100.0000 mg | ORAL_TABLET | Freq: Two times a day (BID) | ORAL | 0 refills | Status: DC

## 2019-11-08 NOTE — Telephone Encounter (Signed)
Received call from patient.   Reports that she had I&D on 1/11/20201. States that abscess to axilla is still draining pus like substance.   Appointment scheduled for evaluation.

## 2019-11-08 NOTE — Progress Notes (Signed)
   Subjective:    Patient ID: Jaclyn Brooks, female    DOB: [birth year], 59 y.o.   MRN: [id number]  Patient presents for Abscess- R Axilla (still draining)  Patient here with continued drainage of her right axilla abscess. She was treated with Bactrim DS twice a day.  She was also given hydrocodone.  She called back stating that she was having reaction to one of the medications she was fatigued had nausea and a headache.  Advised her to stop taking the pain medication and her symptoms resolved, she did complete her antibiotics. The abscess however continues to drain, and she had increased pain yesterday.  No fever, no chills, she does recall this has flared up in the past  Wound culture showed typical skin flora there was no MRSA Review Of Systems:  GEN- denies fatigue, fever, weight loss,weakness, recent illness HEENT- denies eye drainage, change in vision, nasal discharge, CVS- denies chest pain, palpitations RESP- denies SOB, cough, wheeze ABD- denies N/V, change in stools, abd pain GU- denies dysuria, hematuria, dribbling, incontinence MSK- denies joint pain, muscle aches, injury Neuro- denies headache, dizziness, syncope, seizure activity       Objective:    BP 128/80   Pulse (!) 56   Temp 98 F (36.7 C) (Temporal)   Resp 16   Ht 5\' 6"  (1.676 m)   Wt 185 lb (83.9 kg)   SpO2 97%   BMI 29.86 kg/m  GEN- NAD, alert and oriented x3 Skin right axilla 1 x  2 cm boil in the right axilla tender to palpation draining pus, mild induration No appreciable erythema Pulse 2+      Assessment & Plan:      Problem List Items Addressed This Visit    None    Visit Diagnoses    Abscess of right axilla    -  Primary   She declines I&D today, will give doxycyline, use warm compresses, if not improved by Tuesday recommend surgrical evaluation for drainage, pt agrees to this      Note: This dictation was prepared with Dragon dictation along with smaller phrase technology. Any  transcriptional errors that result from this process are unintentional.

## 2019-11-08 NOTE — Patient Instructions (Addendum)
Use warm compresses three times a day  Take antibiotic Not improved by Tuesday- call and you will sent to surgeon for drainage of the abscess F/U 2 months for meds/labs

## 2019-11-18 ENCOUNTER — Other Ambulatory Visit: Payer: Self-pay | Admitting: Family Medicine

## 2019-11-25 ENCOUNTER — Ambulatory Visit (INDEPENDENT_AMBULATORY_CARE_PROVIDER_SITE_OTHER): Payer: Medicare Other | Admitting: Family Medicine

## 2019-11-25 ENCOUNTER — Encounter: Payer: Self-pay | Admitting: Family Medicine

## 2019-11-25 ENCOUNTER — Other Ambulatory Visit: Payer: Self-pay

## 2019-11-25 VITALS — BP 120/62 | HR 60 | Temp 98.5°F | Resp 12 | Ht 66.0 in | Wt 185.0 lb

## 2019-11-25 DIAGNOSIS — R21 Rash and other nonspecific skin eruption: Secondary | ICD-10-CM | POA: Diagnosis not present

## 2019-11-25 MED ORDER — PREDNISONE 10 MG PO TABS: ORAL_TABLET | ORAL | 0 refills | Status: DC

## 2019-11-25 MED ORDER — ATENOLOL 50 MG PO TABS: 50.0000 mg | ORAL_TABLET | Freq: Every day | ORAL | 1 refills | Status: DC

## 2019-11-25 MED ORDER — DILTIAZEM HCL ER COATED BEADS 240 MG PO CP24: 240.0000 mg | ORAL_CAPSULE | Freq: Every day | ORAL | 1 refills | Status: DC

## 2019-11-25 MED ORDER — ATORVASTATIN CALCIUM 20 MG PO TABS: 20.0000 mg | ORAL_TABLET | Freq: Every day | ORAL | 2 refills | Status: DC

## 2019-11-25 MED ORDER — CLOPIDOGREL BISULFATE 75 MG PO TABS: 75.0000 mg | ORAL_TABLET | Freq: Every evening | ORAL | 3 refills | Status: DC

## 2019-11-25 MED ORDER — TRIAMTERENE-HCTZ 37.5-25 MG PO TABS: 1.0000 | ORAL_TABLET | Freq: Every day | ORAL | 3 refills | Status: DC

## 2019-11-25 MED ORDER — PERMETHRIN 5 % EX CREA: TOPICAL_CREAM | CUTANEOUS | 1 refills | Status: DC

## 2019-11-25 NOTE — Patient Instructions (Addendum)
Take the steroids as prescribed Use the cream if you get worsening rash F/U 2 months - fasting labs

## 2019-11-25 NOTE — Progress Notes (Signed)
   Subjective:    Patient ID: Jaclyn Brooks, female    DOB: 1961-06-06, 59 y.o.   MRN: [id number]  Patient presents for Rash (x3 days- red itchy welps to arms and legs- no irritation to torso- )  Patient here with rash for the past 3 days.  She has red itchy not tender to the skin.  States is a feeling itch like mosquito bites.  She did not change her soap or detergent.  No known contact with anyone with rash.  She does recall that a couple weeks ago her father brought over a dog of a family friend at the same time she also had a peanut butter cookie and then she broke out the spot on her right upper arm that went away on its own.  Over the weekend she had a handful of cashews right before she broke out so she is not sure if this is related.  Of note she also completed her doxycycline a week ago as well for the abscess in her axilla.  She denies any difficulty breathing.  She only has a rash on her arms as well as her shins.  He did take a couple doses of Benadryl secondary to the severe itching at times she felt like there is something crawling underneath the skin.  She does not have any lesions on her trunk   Review Of Systems:  GEN- denies fatigue, fever, weight loss,weakness, recent illness HEENT- denies eye drainage, change in vision, nasal discharge, CVS- denies chest pain, palpitations RESP- denies SOB, cough, wheeze ABD- denies N/V, change in stools, abd pain GU- denies dysuria, hematuria, dribbling, incontinence MSK- denies joint pain, muscle aches, injury Neuro- denies headache, dizziness, syncope, seizure activity       Objective:    BP 120/62   Pulse 60   Temp 98.5 F (36.9 C) (Temporal)   Resp 12   Ht 5\' 6"  (1.676 m)   Wt 185 lb (83.9 kg)   SpO2 99%   BMI 29.86 kg/m  GEN- NAD, alert and oriented x3 HEENT- PERRL, EOMI, non injected sclera, pink conjunctiva, MMM, oropharynx clear Neck- Supple, no lymphadenopathy CVS- RRR, no murmur RESP-CTAB Skin erythematous  maculopapular lesions on the dorsal surface of bilateral arm mostly upper arm but a few on the forearm on the left side.  No lesions on the palms.  Similar lesions on bilateral shins and calf lower extremity.  No lesions on face abdomen or back.        Assessment & Plan:      Problem List Items Addressed This Visit    None    Visit Diagnoses    Skin rash    -  Primary   Unclear cause of acute rash.  She does not have known history of nut allergy.  Find it odd that she would present now with a nut allergy There is no sign of burrow lines to suggest scabies but we will need to monitor her rash and see how it progresses.  Suspect that this is a drug reaction she would have more widespread rash  At this time we will treat her with prednisone taper she is also already on Xyzal oral antihistamine.  If she gets progression of the rash despite above treatment we will send her to dermatology        Note: This dictation was prepared with Dragon dictation along with smaller phrase technology. Any transcriptional errors that result from this process are unintentional.

## 2019-12-04 ENCOUNTER — Telehealth: Payer: Self-pay | Admitting: Family Medicine

## 2019-12-04 DIAGNOSIS — L282 Other prurigo: Secondary | ICD-10-CM

## 2019-12-04 MED ORDER — TRIAMCINOLONE ACETONIDE 0.1 % EX CREA: 1.0000 "application " | TOPICAL_CREAM | Freq: Two times a day (BID) | CUTANEOUS | 0 refills | Status: DC

## 2019-12-04 NOTE — Telephone Encounter (Signed)
Patient is calling to say she is breaking out again  Would like a call back to discuss  [phone]

## 2019-12-04 NOTE — Telephone Encounter (Signed)
Call placed to patient to inquire.   Reports that she has new outbreaks of red itchy rash. States that she has new areas on her upper arm, and some on her lower left leg.   Per chart notes, if rash was not resolved by Elimite/ prednisone, referral to dermatology would be needed. Ok to place referral?  Patient states that she has taken (2) Benadryl and itching has subsided for now. Inquired as to if MD has any further recommendations.

## 2019-12-04 NOTE — Telephone Encounter (Signed)
Send referral urgent  Pruritic rash , s/p treatment with recurrence   Keep taking benadryl as needed, also send Triamcinolone 0.1% cream BID toa ffected areas

## 2019-12-04 NOTE — Telephone Encounter (Signed)
Call placed to patient and patient made aware.   Agreeable to plan.   Prescription sent to pharmacy.   Referral orders placed.  

## 2019-12-09 DIAGNOSIS — L308 Other specified dermatitis: Secondary | ICD-10-CM | POA: Diagnosis not present

## 2019-12-11 ENCOUNTER — Encounter: Payer: Self-pay | Admitting: Surgery

## 2019-12-11 ENCOUNTER — Other Ambulatory Visit: Payer: Self-pay | Admitting: Family Medicine

## 2019-12-17 ENCOUNTER — Other Ambulatory Visit: Payer: Self-pay | Admitting: Family Medicine

## 2019-12-17 ENCOUNTER — Telehealth: Payer: Self-pay | Admitting: Family Medicine

## 2019-12-17 DIAGNOSIS — I722 Aneurysm of renal artery: Secondary | ICD-10-CM

## 2019-12-17 NOTE — Telephone Encounter (Signed)
Referral was placed on 06/30/2019.  Can you look into this?

## 2019-12-17 NOTE — Telephone Encounter (Signed)
Patient just needed a new referral placed they had already reached out to her in regards to vascular appointment and she needed to reschedule but they are telling her that the referral has expired.

## 2019-12-17 NOTE — Telephone Encounter (Signed)
Patient needs new referral to vascular and vein if possible  [phone]

## 2019-12-27 ENCOUNTER — Ambulatory Visit: Payer: Medicare Other | Attending: Internal Medicine

## 2019-12-27 DIAGNOSIS — Z23 Encounter for immunization: Secondary | ICD-10-CM

## 2019-12-27 NOTE — Progress Notes (Signed)
   Covid-19 Vaccination Clinic  Name:  Jaclyn Brooks    MRN: [id number] DOB: [birth year]  12/27/2019  [name] was observed post Covid-19 immunization for 15 minutes without incident. She was provided with Vaccine Information Sheet and instruction to access the V-Safe system.   [name] was instructed to call 911 with any severe reactions post vaccine: Marland Kitchen Difficulty breathing  . Swelling of face and throat  . A fast heartbeat  . A bad rash all over body  . Dizziness and weakness   Immunizations Administered    Name Date Dose VIS Date Route   Moderna COVID-19 Vaccine 12/27/2019  8:35 AM 0.5 mL 09/10/2019 Intramuscular   Manufacturer: Moderna   Lot: [id number]   [id number]

## 2020-01-17 ENCOUNTER — Other Ambulatory Visit: Payer: Self-pay | Admitting: *Deleted

## 2020-01-17 DIAGNOSIS — I722 Aneurysm of renal artery: Secondary | ICD-10-CM

## 2020-01-21 ENCOUNTER — Telehealth: Payer: Self-pay | Admitting: Family Medicine

## 2020-01-21 NOTE — Telephone Encounter (Signed)
Patient called in with c/o fluid on left elbow, that is warm to the touch. States that the swelling has been spreading and has concerns of bursitis to this elbow. Wanted to schedule patient for tomorrow however unsure if Crystal will be in and you are at capacity tomorrow. Please advise?

## 2020-01-21 NOTE — Telephone Encounter (Signed)
Patient notified to go to UC. Patient verbalized understanding.

## 2020-01-21 NOTE — Telephone Encounter (Signed)
Send to Urgent Care, If I am booked and Crystal not here

## 2020-01-23 ENCOUNTER — Telehealth (HOSPITAL_COMMUNITY): Payer: Self-pay

## 2020-01-23 NOTE — Telephone Encounter (Signed)

## 2020-01-24 ENCOUNTER — Encounter: Payer: Self-pay | Admitting: Vascular Surgery

## 2020-01-24 ENCOUNTER — Ambulatory Visit (HOSPITAL_COMMUNITY)
Admission: RE | Admit: 2020-01-24 | Discharge: 2020-01-24 | Disposition: A | Discharge status: Home / Self Care | Payer: Medicare Other | Source: Ambulatory Visit | Attending: Vascular Surgery | Admitting: Vascular Surgery

## 2020-01-24 ENCOUNTER — Other Ambulatory Visit: Payer: Self-pay

## 2020-01-24 ENCOUNTER — Ambulatory Visit: Payer: Medicare Other | Admitting: Vascular Surgery

## 2020-01-24 VITALS — BP 151/77 | HR 53 | Temp 97.2°F | Resp 20 | Ht 66.0 in | Wt 191.0 lb

## 2020-01-24 DIAGNOSIS — I722 Aneurysm of renal artery: Secondary | ICD-10-CM | POA: Diagnosis not present

## 2020-01-24 NOTE — Progress Notes (Signed)
Patient ID: Jaclyn Brooks, female   DOB: 12-29-1960, 59 y.o.   MRN: [id number]  Reason for Consult: New Patient (Initial Visit)   Referred by Jaclyn Rossetti, Brooks  Subjective:     HPI:  Jaclyn Brooks is a 59 y.o. 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  She has been followed in [place] for these aneurysms since that time.  She states that she has been very stable.  She reportedly has stable renal function.  She has no history of coronary artery disease.  She has no lower extremity symptoms.  Past Medical History:  Diagnosis Date  . Allergy   . Depression   . Gout   . Hyperlipidemia   . Hypertension   . 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HCC)    Family History  Problem Relation Age of Onset  . Hypertension Mother   . Heart disease Mother        Cardiac Arrest   . Hypertension Father   . Hypertension Sister   . Cancer Brother        Bladder Cancer   . Hypertension Brother    Past Surgical History:  Procedure Laterality Date  . ABDOMINAL HYSTERECTOMY    . CHOLECYSTECTOMY N/A 01/28/2015   Procedure: LAPAROSCOPIC CHOLECYSTECTOMY;  Surgeon: Jaclyn Signs Md, Brooks;  Location: [facility];  Service: General;  Laterality: N/A;  . excision of bone spurs Bilateral    Big toes  . renal aneurysm Right    right coil-and only has function of left kidney  . X-STOP IMPLANTATION      Short Social History:  Social History   Tobacco Use  . Smoking status: Former Smoker    Packs/day: 0.25    Years: 20.00    Pack years: 5.00    Types: Cigarettes  . Smokeless tobacco: Never Used  Substance Use Topics  . Alcohol use: No    Allergies  Allergen Reactions  . Bee Venom Hives  . Shellfish Allergy Itching    Current Outpatient Medications  Medication Sig Dispense Refill  . albuterol (PROVENTIL HFA;VENTOLIN HFA) 108 (90 Base) MCG/ACT inhaler Inhale into the lungs every 6 (six) hours as needed for wheezing or shortness of breath.    Marland Kitchen atenolol  (TENORMIN) 50 MG tablet Take 1 tablet (50 mg total) by mouth daily. 90 tablet 1  . budesonide-formoterol (SYMBICORT) 80-4.5 MCG/ACT inhaler Inhale 2 puffs into the lungs 2 (two) times daily. 1 Inhaler 3  . clopidogrel (PLAVIX) 75 MG tablet Take 1 tablet (75 mg total) by mouth every evening. 90 tablet 3  . Diclofenac Sodium CR 100 MG 24 hr tablet Take 1 tablet by mouth once daily 30 tablet 0  . diltiazem (CARTIA XT) 240 MG 24 hr capsule Take 1 capsule (240 mg total) by mouth daily. 90 capsule 1  . ondansetron (ZOFRAN) 4 MG tablet Take 1 tablet (4 mg total) by mouth every 8 (eight) hours as needed for nausea or vomiting. 10 tablet 0  . pantoprazole (PROTONIX) 40 MG tablet Take 1 tablet by mouth once daily 90 tablet 1  . triamcinolone cream (KENALOG) 0.1 % Apply 1 application topically 2 (two) times daily. To affected areas on arms and legs 30 g 0  . triamterene-hydrochlorothiazide (MAXZIDE-25) 37.5-25 MG tablet Take 1 tablet by mouth daily. 90 tablet 3  . atorvastatin (LIPITOR) 20 MG tablet Take 1 tablet (20 mg total) by mouth at bedtime. (Patient not taking: Reported on  01/24/2020) 90 tablet 2  . LORazepam (ATIVAN) 0.5 MG tablet Take 1 tablet (0.5 mg total) by mouth 2 (two) times daily as needed for anxiety. (Patient not taking: Reported on 01/24/2020) 30 tablet 1   No current facility-administered medications for this visit.    Review of Systems  Constitutional:  Constitutional negative. HENT: HENT negative.  Eyes: Eyes negative.  Respiratory: Respiratory negative.  Cardiovascular: Cardiovascular negative.  GI: Positive for abdominal pain.  Musculoskeletal: Musculoskeletal negative.  Skin: Skin negative.  Neurological: Neurological negative. Hematologic: Hematologic/lymphatic negative.  Psychiatric: Psychiatric negative.        Objective:  Objective   Vitals:   01/24/20 0857  BP: (!) 151/77  Pulse: (!) 53  Resp: 20  Temp: (!) 97.2 F (36.2 C)  SpO2: 98%  Weight: 191 lb (86.6  kg)  Height: 5\' 6"  (1.676 m)   Body mass index is 30.83 kg/m.  Physical Exam HENT:     Head: Normocephalic.     Nose: Nose normal.     Mouth/Throat:     Mouth: Mucous membranes are moist.  Eyes:     Pupils: Pupils are equal, round, and reactive to light.  Neck:     Vascular: No carotid bruit.  Cardiovascular:     Rate and Rhythm: Normal rate.  Pulmonary:     Effort: Pulmonary effort is normal.  Abdominal:     General: Abdomen is flat.     Palpations: Abdomen is soft.  Musculoskeletal:        General: Normal range of motion.  Skin:    General: Skin is warm and dry.     Capillary Refill: Capillary refill takes less than 2 seconds.  Neurological:     General: No focal deficit present.     Mental Status: She is alert.  Psychiatric:        Mood and Affect: Mood normal.        Behavior: Behavior normal.        Thought Content: Thought content normal.        Judgment: Judgment normal.     Data: I have independently interpreted her renal artery duplex.  There is no evidence of renal artery aneurysm.  The right kidney is quite diminutive at 8.8 cm in length.  Left kidney 12 cm's length.  No evidence of renal artery aneurysm or stenosis.     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  This has been followed in [place] for the past several years she now presents for establishing follow-up.  By duplex that I do not see any aneurysms and her right kidney is quite diminutive as it was in the past in 2006 by reviewing CT scan.  I have offered her a noncontrasted CT scan to evaluate given that we did not see any evidence of aneurysm on her ultrasound.  Patient states that she has been very stable she would like to follow-up with duplex in 1 more year which we will get scheduled today.     Waynetta Sandy [facility]

## 2020-01-27 ENCOUNTER — Other Ambulatory Visit: Payer: Self-pay | Admitting: *Deleted

## 2020-01-27 DIAGNOSIS — I722 Aneurysm of renal artery: Secondary | ICD-10-CM

## 2020-01-28 ENCOUNTER — Ambulatory Visit: Payer: Medicare Other | Attending: Internal Medicine

## 2020-01-28 DIAGNOSIS — Z23 Encounter for immunization: Secondary | ICD-10-CM

## 2020-01-28 NOTE — Progress Notes (Signed)
   Covid-19 Vaccination Clinic  Name:  [name]    MRN: [id number] DOB: 1961-07-14  01/28/2020  [name] was observed post Covid-19 immunization for 15 minutes without incident. She was provided with Vaccine Information Sheet and instruction to access the V-Safe system.   [name] was instructed to call 911 with any severe reactions post vaccine: Marland Kitchen Difficulty breathing  . Swelling of face and throat  . A fast heartbeat  . A bad rash all over body  . Dizziness and weakness   Immunizations Administered    Name Date Dose VIS Date Route   Moderna COVID-19 Vaccine 01/28/2020  8:26 AM 0.5 mL 09/2019 Intramuscular   Manufacturer: Moderna   Lot: [id number]   [id number]

## 2020-02-18 ENCOUNTER — Ambulatory Visit (INDEPENDENT_AMBULATORY_CARE_PROVIDER_SITE_OTHER): Payer: Medicare Other

## 2020-02-18 ENCOUNTER — Ambulatory Visit
Admission: EM | Admit: 2020-02-18 | Discharge: 2020-02-18 | Disposition: A | Discharge status: Home / Self Care | Payer: Medicare Other | Attending: Emergency Medicine | Admitting: Emergency Medicine

## 2020-02-18 DIAGNOSIS — S99912A Unspecified injury of left ankle, initial encounter: Secondary | ICD-10-CM | POA: Diagnosis not present

## 2020-02-18 DIAGNOSIS — M25572 Pain in left ankle and joints of left foot: Secondary | ICD-10-CM

## 2020-02-18 DIAGNOSIS — M899 Disorder of bone, unspecified: Secondary | ICD-10-CM

## 2020-02-18 DIAGNOSIS — R600 Localized edema: Secondary | ICD-10-CM | POA: Diagnosis not present

## 2020-02-18 DIAGNOSIS — M949 Disorder of cartilage, unspecified: Secondary | ICD-10-CM

## 2020-02-18 DIAGNOSIS — M25472 Effusion, left ankle: Secondary | ICD-10-CM

## 2020-02-18 MED ORDER — NAPROXEN 375 MG PO TABS: 375.0000 mg | ORAL_TABLET | Freq: Two times a day (BID) | ORAL | 0 refills | Status: DC

## 2020-02-18 MED ORDER — PREDNISONE 20 MG PO TABS: 20.0000 mg | ORAL_TABLET | Freq: Two times a day (BID) | ORAL | 0 refills | Status: AC

## 2020-02-18 NOTE — Discharge Instructions (Signed)
X-rays were negative for fracture or dislocation, however, did show a osteochondral lesion in the foot.  This may be a sign of cartilage damage Continue conservative management of rest, ice, and elevation Take naproxen as needed for pain relief (may cause abdominal discomfort, ulcers, and GI bleeds avoid taking with other NSAIDs) Follow up with orthopedist for further evaluation and management Return or go to the ER if you have any new or worsening symptoms (fever, chills, chest pain, redness, swelling, deformity, bruising, worsening symptoms despite treatment, etc...)

## 2020-02-18 NOTE — ED Provider Notes (Signed)
[place]   [id number] 02/18/20 Arrival Time: 0820  CC: LT ankle PAIN  SUBJECTIVE: History from: patient. Jaclyn Brooks is a 59 y.o. female complains of left ankle pain and swelling x 2 days ago.  Was walking down steps and heard a crunch in ankle.  Localizes the pain to the outside of ankle.  Describes the pain as intermittent and achy in character.  8/10.  Has tried OTC medications with minimal relief.  Symptoms are made worse with walking.  Reports similar symptoms in the past that improved with wraping.  Complains of associated swelling.  Denies fever, chills, erythema, ecchymosis, weakness, numbness and tingling.    ROS: As per HPI.  All other pertinent ROS negative.     Past Medical History:  Diagnosis Date  . Allergy   . Depression   . Gout   . Hyperlipidemia   . Hypertension   . 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 ABDOMINAL HYSTERECTOMY    . CHOLECYSTECTOMY N/A 01/28/2015   Procedure: LAPAROSCOPIC CHOLECYSTECTOMY;  Surgeon: Aviva Signs Md, MD;  Location: [facility];  Service: General;  Laterality: N/A;  . excision of bone spurs Bilateral    Big toes  . renal aneurysm Right    right coil-and only has function of left kidney  . X-STOP IMPLANTATION     Allergies  Allergen Reactions  . Bee Venom Hives  . Nsaids     Kidney disease   . Shellfish Allergy Itching   No current facility-administered medications on file prior to encounter.   Current Outpatient Medications on File Prior to Encounter  Medication Sig Dispense Refill  . albuterol (PROVENTIL HFA;VENTOLIN HFA) 108 (90 Base) MCG/ACT inhaler Inhale into the lungs every 6 (six) hours as needed for wheezing or shortness of breath.    Marland Kitchen atenolol (TENORMIN) 50 MG tablet Take 1 tablet (50 mg total) by mouth daily. 90 tablet 1  . budesonide-formoterol (SYMBICORT) 80-4.5 MCG/ACT inhaler Inhale 2 puffs into the lungs 2 (two) times daily. 1 Inhaler 3  . clopidogrel  (PLAVIX) 75 MG tablet Take 1 tablet (75 mg total) by mouth every evening. 90 tablet 3  . Diclofenac Sodium CR 100 MG 24 hr tablet Take 1 tablet by mouth once daily 30 tablet 0  . diltiazem (CARTIA XT) 240 MG 24 hr capsule Take 1 capsule (240 mg total) by mouth daily. 90 capsule 1  . ondansetron (ZOFRAN) 4 MG tablet Take 1 tablet (4 mg total) by mouth every 8 (eight) hours as needed for nausea or vomiting. 10 tablet 0  . pantoprazole (PROTONIX) 40 MG tablet Take 1 tablet by mouth once daily 90 tablet 1  . triamcinolone cream (KENALOG) 0.1 % Apply 1 application topically 2 (two) times daily. To affected areas on arms and legs 30 g 0  . triamterene-hydrochlorothiazide (MAXZIDE-25) 37.5-25 MG tablet Take 1 tablet by mouth daily. 90 tablet 3  . [DISCONTINUED] atorvastatin (LIPITOR) 20 MG tablet Take 1 tablet (20 mg total) by mouth at bedtime. (Patient not taking: Reported on 01/24/2020) 90 tablet 2   Social History   Socioeconomic History  . Marital status: Single    Spouse name: Not on file  . Number of children: Not on file  . Years of education: Not on file  . Highest education level: Not on file  Occupational History  . Occupation: Retired  Tobacco Use  . Smoking status: Former Smoker    Packs/day: 0.25  Years: 20.00    Pack years: 5.00    Types: Cigarettes  . Smokeless tobacco: Never Used  Substance and Sexual Activity  . Alcohol use: No  . Drug use: No  . Sexual activity: Yes    Birth control/protection: Surgical  Other Topics Concern  . Not on file  Social History Narrative  . Not on file   Social Determinants of Health   Financial Resource Strain:   . Difficulty of Paying Living Expenses:   Food Insecurity:   . Worried About Charity fundraiser in the Last Year:   . Arboriculturist in the Last Year:   Transportation Needs:   . Film/video editor (Medical):   Marland Kitchen Lack of Transportation (Non-Medical):   Physical Activity:   . Days of Exercise per Week:   . Minutes  of Exercise per Session:   Stress:   . Feeling of Stress :   Social Connections:   . Frequency of Communication with Friends and Family:   . Frequency of Social Gatherings with Friends and Family:   . Attends Religious Services:   . Active Member of Clubs or Organizations:   . Attends Archivist Meetings:   Marland Kitchen Marital Status:   Intimate Partner Violence:   . Fear of Current or Ex-Partner:   . Emotionally Abused:   Marland Kitchen Physically Abused:   . Sexually Abused:    Family History  Problem Relation Age of Onset  . Hypertension Mother   . Heart disease Mother        Cardiac Arrest   . Hypertension Father   . Hypertension Sister   . Cancer Brother        Bladder Cancer   . Hypertension Brother     OBJECTIVE:  Vitals:   02/18/20 0828  BP: (!) 147/85  Pulse: (!) 52  Resp: 18  Temp: 98.3 F (36.8 C)  SpO2: 98%    General appearance: ALERT; in no acute distress.  Head: NCAT Lungs: Normal respiratory effort CV: Dorsalis pedis pulse 2+ Musculoskeletal: LT ankle Inspection: mild to moderate swelling to medial and lateral ankle Palpation: TTP over lateral ankle ROM: FROM active and passive Strength: deferred Skin: warm and dry Neurologic: Ambulates with minimal difficulty; Sensation intact about the lower extremities Psychological: alert and cooperative; normal mood and affect  DIAGNOSTIC STUDIES:  DG Ankle Complete Left  Addendum Date: 02/18/2020   ADDENDUM REPORT: 02/18/2020 09:14 ADDENDUM: Subtle lucency in the medial talar dome may represent a small osteochondral lesion. Electronically Signed   By: Zetta Bills M.D.   On: 02/18/2020 09:14   Result Date: 02/18/2020 CLINICAL DATA:  Injury with swelling. EXAM: LEFT ANKLE COMPLETE - 3+ VIEW COMPARISON:  None FINDINGS: Soft tissue swelling about the ankle and signs of ankle effusion. No visible fracture. Ankle mortise view is limited by obliquity. AP view is normal. IMPRESSION: Soft tissue swelling and ankle effusion  without visible fracture. Mortise view limited by obliquity. Electronically Signed: By: Zetta Bills M.D. On: 02/18/2020 09:10    X-rays negative for fracture, or dislocation.    I have reviewed the x-rays myself and the radiologist interpretation. I am in agreement with the radiologist interpretation.     ASSESSMENT & PLAN:  1. Acute left ankle pain   2. Osteochondral lesion      Meds ordered this encounter  Medications  . DISCONTD: naproxen (NAPROSYN) 375 MG tablet    Sig: Take 1 tablet (375 mg total) by mouth 2 (two)  times daily with a meal.    Dispense:  20 tablet    Refill:  0    Order Specific Question:   Supervising Provider    Answer:   Raylene Everts [id number]  . predniSONE (DELTASONE) 20 MG tablet    Sig: Take 1 tablet (20 mg total) by mouth 2 (two) times daily with a meal for 5 days.    Dispense:  10 tablet    Refill:  0    Order Specific Question:   Supervising Provider    Answer:   Raylene Everts [id number]   Declines ankle brace, walking boot, and crutches at this time. Would like to try ace wrap  X-rays were negative for fracture or dislocation, however, did show a osteochondral lesion in the foot.  This may be a sign of cartilage damage Continue conservative management of rest, ice, and elevation Take naproxen as needed for pain relief (may cause abdominal discomfort, ulcers, and GI bleeds avoid taking with other NSAIDs) Follow up with orthopedist for further evaluation and management Return or go to the ER if you have any new or worsening symptoms (fever, chills, chest pain, redness, swelling, deformity, bruising, worsening symptoms despite treatment, etc...)    Reviewed expectations re: course of current medical issues. Questions answered. Outlined signs and symptoms indicating need for more acute intervention. Patient verbalized understanding. After Visit Summary given.    Lestine Box, PA-C 02/18/20 [phone]

## 2020-02-18 NOTE — ED Triage Notes (Signed)
Pt presents with left ankle pain and swelling. Pt states she was walking down steps and heard a crunch a couple days ago

## 2020-02-19 ENCOUNTER — Other Ambulatory Visit: Payer: Self-pay

## 2020-02-19 ENCOUNTER — Encounter: Payer: Self-pay | Admitting: Family Medicine

## 2020-02-19 ENCOUNTER — Ambulatory Visit (INDEPENDENT_AMBULATORY_CARE_PROVIDER_SITE_OTHER): Payer: Medicare Other | Admitting: Family Medicine

## 2020-02-19 VITALS — BP 138/86 | HR 58 | Temp 98.1°F | Resp 14 | Ht 66.0 in | Wt 188.0 lb

## 2020-02-19 DIAGNOSIS — M949 Disorder of cartilage, unspecified: Secondary | ICD-10-CM

## 2020-02-19 DIAGNOSIS — M899 Disorder of bone, unspecified: Secondary | ICD-10-CM

## 2020-02-19 DIAGNOSIS — S93402D Sprain of unspecified ligament of left ankle, subsequent encounter: Secondary | ICD-10-CM | POA: Diagnosis not present

## 2020-02-19 NOTE — Patient Instructions (Addendum)
F/U end of  Oct for PHYSICAL

## 2020-02-19 NOTE — Progress Notes (Signed)
   Subjective:    Patient ID: Jaclyn Brooks, female    DOB: 12-09-1960, 59 y.o.   MRN: [id number]  Patient presents for UC F/U (x3 days- L foot pain- seen at [facility] and had X-ray- soft tissue swelling and osteochondral lesion )  Patient here to follow-up left foot pain. She was seen at urgent care a couple days ago after she stepped wrong and heard a popping sensation. She has had injury to her left foot before and is actually to wear soft cast in the past due to injury. X-ray was obtained not show any fracture but did show an osteochondral lesion concerning for possible cartilage tear or other recurrent trauma to this area. Was recommended that she follow-up with orthopedics. She is taking prednisone which has helped with the inflammation. I to avoid NSAIDs in the setting of her chronic kidney disease    Review Of Systems:  GEN- denies fatigue, fever, weight loss,weakness, recent illness HEENT- denies eye drainage, change in vision, nasal discharge, CVS- denies chest pain, palpitations RESP- denies SOB, cough, wheeze MSK- + joint pain, muscle aches, injury        Objective:    BP 138/86   Pulse (!) 58   Temp 98.1 F (36.7 C) (Temporal)   Resp 14   Ht 5\' 6"  (1.676 m)   Wt 188 lb (85.3 kg)   SpO2 99%   BMI 30.34 kg/m  GEN- NAD, alert and oriented x3 Musculoskeletal-right ankle full range of motion no swelling ,left ankle fair range of motion, pain with flexion,  swelling across the lower talar region as well as the lateral malleolus tender to palpation at the lateral aspect, Achilles tendon intact Good range of motion of knees no effusion Pulse- DP 2+        Assessment & Plan:      Problem List Items Addressed This Visit    None    Visit Diagnoses    Osteochondral lesion of talar dome    -  Primary   Ankle sprain with ? lesion on xray, refer to orthopedics for further treatment, continue prednisone, ICE, elevate, ACE wrap   Relevant Orders   Ambulatory referral to  Orthopedic Surgery   Sprain of left ankle, unspecified ligament, subsequent encounter       Relevant Orders   Ambulatory referral to Orthopedic Surgery      Note: This dictation was prepared with Dragon dictation along with smaller phrase technology. Any transcriptional errors that result from this process are unintentional.

## 2020-02-20 ENCOUNTER — Ambulatory Visit (INDEPENDENT_AMBULATORY_CARE_PROVIDER_SITE_OTHER): Payer: Medicare Other | Admitting: Orthopedic Surgery

## 2020-02-20 ENCOUNTER — Other Ambulatory Visit: Payer: Self-pay

## 2020-02-20 ENCOUNTER — Encounter: Payer: Self-pay | Admitting: Family Medicine

## 2020-02-20 DIAGNOSIS — M25472 Effusion, left ankle: Secondary | ICD-10-CM | POA: Diagnosis not present

## 2020-02-20 DIAGNOSIS — S82899A Other fracture of unspecified lower leg, initial encounter for closed fracture: Secondary | ICD-10-CM

## 2020-02-20 DIAGNOSIS — M25572 Pain in left ankle and joints of left foot: Secondary | ICD-10-CM | POA: Diagnosis not present

## 2020-02-21 ENCOUNTER — Encounter: Payer: Self-pay | Admitting: Orthopedic Surgery

## 2020-02-21 NOTE — Progress Notes (Signed)
Office Visit Note   Patient: Jaclyn Brooks           Date of Birth: 1961-09-15           MRN: [id number] Visit Date: 02/20/2020 Requested by: Alycia Rossetti, MD [address] [place],  [place] [address] PCP: Alycia Rossetti, MD  Subjective: Chief Complaint  Patient presents with  . Left Ankle - Pain    HPI: Jaclyn Brooks is a 59 y.o. female who presents to the office complaining of left ankle pain.  Patient was descending stairs on 02/18/2020 when she reports hearing a crunch from her left ankle and feeling pain.  She was just descending stairs, and denies any inversion or eversion injury to her ankle.  She denies any axial load causing her pain.  She was seen in the ED where x-rays of the left ankle revealed no significant fracture but there was a lucency of the medial aspect of the talar dome that may represent an OCD lesion.  She localizes the majority of her pain to posterior to the lateral malleolus with increased swelling.  Swelling has improved in the last 2 days.  She has been taking a steroid Dosepak as she cannot take anti-inflammatories..                ROS:  All systems reviewed are negative as they relate to the chief complaint within the history of present illness.  Patient denies fevers or chills.  Assessment & Plan: Visit Diagnoses:  1. Pain in left ankle and joints of left foot   2. Left ankle swelling     Plan: Patient is a 59 year old female presents complaint of left ankle pain.  She was descending stairs when she felt a crunch.  Denies any inversion/eversion injury.  Denies any previous ankle injury.  Radiographs reveal no fracture but there is a possible talar dome lesion.  Radiographs were reviewed today with the patient.  She does have some tenderness over the medial ankle joint line but the area of maximal tenderness is just posterior to the lateral malleolus.  She has significant swelling around this area as well.  This injury does not fit the classic  presentation of many common ankle/foot injuries.  Due to the amount of swelling, and the atypical presentation, ordered left ankle MRI.  Evaluate for OCD lesion of the left ankle joint as well as potential longitudinal tear of the peroneal tendon.  In the meantime patient will remain weightbearing as tolerated in an ankle sports orthotic.  Follow-up after MRI to review results.  Follow-Up Instructions: No follow-ups on file.   Orders:  No orders of the defined types were placed in this encounter.  No orders of the defined types were placed in this encounter.     Procedures: No procedures performed   Clinical Data: No additional findings.  Objective: Vital Signs: There were no vitals taken for this visit.  Physical Exam:  Constitutional: Patient appears well-developed HEENT:  Head: Normocephalic Eyes:EOM are normal Neck: Normal range of motion Cardiovascular: Normal rate Pulmonary/chest: Effort normal Neurologic: Patient is alert Skin: Skin is warm Psychiatric: Patient has normal mood and affect  Ortho Exam:  Moderate soft tissue swelling just posterior to the lateral malleolus.  No significant areas of swelling throughout the rest of the left ankle or foot.  Tender to palpation mostly over the area of swelling.  No significant tenderness to palpation over the lateral malleolus, medial malleolus, retrocalcaneal space,  Achilles tendon, forefoot, Lisfranc complex, lateral ankle joint.  Tender to palpation over the medial ankle joint as well as the fifth metatarsal base.  Dorsiflexion, plantarflexion, eversion, inversion are intact actively.  Anterior tibialis tendon intact.  2+ DP pulse.  Sensation intact throughout the left foot.  Syndesmosis intact.  Negative anterior drawer of the left ankle.  No tenderness in the sinus Tarsi region to direct palpation.  No ankle effusion.  Specialty Comments:  No specialty comments available.  Imaging: No results found.   PMFS  History: Patient Active Problem List   Diagnosis Date Noted  . GERD (gastroesophageal reflux disease) 05/31/2019  . Solitary kidney, acquired 05/10/2019  . Carotid arterial disease ([place]) 12/11/2018  . COPD, mild ([place]) 10/01/2018  . CKD (chronic kidney disease) stage 3, GFR 30-59 ml/min 10/01/2018  . Aortic valve sclerosis 10/01/2018  . Hypertension 09/11/2018   Past Medical History:  Diagnosis Date  . Allergy   . Depression   . Gout   . Hyperlipidemia   . Hypertension   . 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 Hypertension Mother   . Heart disease Mother        Cardiac Arrest   . Hypertension Father   . Hypertension Sister   . Cancer Brother        Bladder Cancer   . Hypertension Brother     Past Surgical History:  Procedure Laterality Date  . ABDOMINAL HYSTERECTOMY    . CHOLECYSTECTOMY N/A 01/28/2015   Procedure: LAPAROSCOPIC CHOLECYSTECTOMY;  Surgeon: Aviva Signs Md, MD;  Location: [facility];  Service: General;  Laterality: N/A;  . excision of bone spurs Bilateral    Big toes  . renal aneurysm Right    right coil-and only has function of left kidney  . X-STOP IMPLANTATION     Social History   Occupational History  . Occupation: Retired  Tobacco Use  . Smoking status: Former Smoker    Packs/day: 0.25    Years: 20.00    Pack years: 5.00    Types: Cigarettes  . Smokeless tobacco: Never Used  Substance and Sexual Activity  . Alcohol use: No  . Drug use: No  . Sexual activity: Yes    Birth control/protection: Surgical

## 2020-02-22 ENCOUNTER — Encounter: Payer: Self-pay | Admitting: Orthopedic Surgery

## 2020-02-24 ENCOUNTER — Other Ambulatory Visit: Payer: Self-pay

## 2020-02-24 ENCOUNTER — Telehealth: Payer: Self-pay | Admitting: Orthopedic Surgery

## 2020-02-24 ENCOUNTER — Ambulatory Visit
Admission: RE | Admit: 2020-02-24 | Discharge: 2020-02-24 | Disposition: A | Discharge status: Home / Self Care | Payer: Medicare Other | Source: Ambulatory Visit | Attending: Orthopedic Surgery | Admitting: Orthopedic Surgery

## 2020-02-24 DIAGNOSIS — S82899A Other fracture of unspecified lower leg, initial encounter for closed fracture: Secondary | ICD-10-CM

## 2020-02-24 NOTE — Telephone Encounter (Signed)
Patient is not able to do MRI's due to something with her knee.  She is only able to get CT's.  Patient is requesting that the order be changed to a CT of her ankle.  CB#[phone].  Thank you.

## 2020-02-24 NOTE — Telephone Encounter (Signed)
Called patient got recording mailbox is full and could not leave message    Will try later    Need MRI review appointment with Dr Marlou Sa

## 2020-02-24 NOTE — Telephone Encounter (Signed)
See below. Can we change this or does a new order need to be entered?

## 2020-02-25 NOTE — Telephone Encounter (Signed)
I changed it to a CT

## 2020-02-27 ENCOUNTER — Telehealth: Payer: Self-pay | Admitting: Orthopedic Surgery

## 2020-02-27 NOTE — Telephone Encounter (Signed)
Patient called advised her appointment with [facility] is scheduled for 03/11/2020. Patient asked if she can get an injection when she come to her appointment scheduled on 03/05/2020 to help with the pain in her left foot? The number to contact patient is [phone]

## 2020-02-27 NOTE — Telephone Encounter (Signed)
I'm sorry, I missed quoted the body part.  It is not in reference to her knee.

## 2020-02-27 NOTE — Telephone Encounter (Signed)
Tried calling, no answer, unable to LM.  VM is full.

## 2020-02-27 NOTE — Telephone Encounter (Signed)
Can either of you advise? 

## 2020-02-27 NOTE — Telephone Encounter (Signed)
No because of her not really sure where the problem is so injecting would be not advised especially if there is a potential for later surgery

## 2020-02-28 ENCOUNTER — Other Ambulatory Visit: Payer: Self-pay | Admitting: Family Medicine

## 2020-02-28 ENCOUNTER — Other Ambulatory Visit: Payer: Self-pay | Admitting: Surgical

## 2020-02-28 MED ORDER — ACETAMINOPHEN-CODEINE #3 300-30 MG PO TABS: 1.0000 | ORAL_TABLET | Freq: Every day | ORAL | 0 refills | Status: DC | PRN

## 2020-02-28 NOTE — Telephone Encounter (Signed)
submitted

## 2020-02-28 NOTE — Telephone Encounter (Signed)
Patient would like this sent to [facility] in [place].

## 2020-02-28 NOTE — Telephone Encounter (Signed)
I can submit RX for Tylenol #3 if she can tolerate that.  Don't want to RX NSAIDs/Tramadol due to history of chronic kidney disease.

## 2020-02-28 NOTE — Telephone Encounter (Signed)
thx

## 2020-02-28 NOTE — Telephone Encounter (Signed)
Advised patient of below. Patient said she is in severe pain and at times can hardly walk. She said scan is not until June and something HAS to be done ASAP for her pain. Please advise.

## 2020-03-05 ENCOUNTER — Ambulatory Visit: Payer: Medicare Other | Admitting: Orthopedic Surgery

## 2020-03-11 ENCOUNTER — Ambulatory Visit
Admission: RE | Admit: 2020-03-11 | Discharge: 2020-03-11 | Disposition: A | Discharge status: Home / Self Care | Payer: Medicare Other | Source: Ambulatory Visit | Attending: Orthopedic Surgery | Admitting: Orthopedic Surgery

## 2020-03-11 ENCOUNTER — Other Ambulatory Visit: Payer: Self-pay

## 2020-03-11 DIAGNOSIS — M25472 Effusion, left ankle: Secondary | ICD-10-CM | POA: Diagnosis not present

## 2020-03-11 DIAGNOSIS — S82899A Other fracture of unspecified lower leg, initial encounter for closed fracture: Secondary | ICD-10-CM

## 2020-03-16 ENCOUNTER — Ambulatory Visit (INDEPENDENT_AMBULATORY_CARE_PROVIDER_SITE_OTHER): Payer: Medicare Other | Admitting: Orthopedic Surgery

## 2020-03-16 ENCOUNTER — Encounter: Payer: Self-pay | Admitting: Orthopedic Surgery

## 2020-03-16 VITALS — Ht 66.0 in | Wt 180.0 lb

## 2020-03-16 DIAGNOSIS — M19072 Primary osteoarthritis, left ankle and foot: Secondary | ICD-10-CM

## 2020-03-21 ENCOUNTER — Encounter: Payer: Self-pay | Admitting: Orthopedic Surgery

## 2020-03-21 NOTE — Progress Notes (Signed)
Office Visit Note   Patient: DEMITRIA Brooks           Date of Birth: [birth year]           MRN: [id number] Visit Date: 03/16/2020 Requested by: Alycia Rossetti, MD [address] [place],  [place] [address] PCP: Alycia Rossetti, MD  Subjective: Chief Complaint  Patient presents with  . Left Ankle - Pain    CT Review    HPI: Jaclyn Brooks is a 59 y.o. female who presents to the office complaining of left ankle pain.  Patient presents for review.  She notes that her symptoms are about the same she has been taking propafenone on occasion with relief with function by her primary care physician.  She does not like to take this too often due to her history of CKD 3 localizes pain to the medial aspect of the ankle joint as well as the lateral aspect of the ankle joint between the distal fibula and the talus..                ROS:  All systems reviewed are negative as they relate to the chief complaint within the history of present illness.  Patient denies fevers or chills.  Assessment & Plan: Visit Diagnoses:  1. Primary osteoarthritis, left ankle and foot     Plan: Patient is a 59 year old female presents for left ankle CT scan review.  CT scan reviewed multiple subcortical cysts in the medial aspect of the dome of the talus likely due to overlying cartilage loss as well as slight degenerative changes of the talofibular joint.  No acute abnormality of the left ankle was identified.  There was a small ankle effusion.  With these results, recommended patient try a cortisone injection into the left ankle, especially since she cannot take anti-inflammatories for long periods of time due to her CKD.  Patient agreed with this plan but that she wants to come back another day to do it.  Plan to follow-up with for left ankle injection.  Patient agreed with this plan.  Follow-Up Instructions: No follow-ups on file.   Orders:  No orders of the defined types were placed in this encounter.  No  orders of the defined types were placed in this encounter.     Procedures: No procedures performed   Clinical Data: No additional findings.  Objective: Vital Signs: Ht 5\' 6"  (1.676 m)   Wt 180 lb (81.6 kg)   BMI 29.05 kg/m   Physical Exam:  Constitutional: Patient appears well-developed HEENT:  Head: Normocephalic Eyes:EOM are normal Neck: Normal range of motion Cardiovascular: Normal rate Pulmonary/chest: Effort normal Neurologic: Patient is alert Skin: Skin is warm Psychiatric: Patient has normal mood and affect  Ortho Exam:  Left ankle dorsiflexion, plantarflexion, eversion, inversion intact actively.  No significant pain with passive range of motion of the left ankle joint.  Syndesmosis intact.  Tenderness to palpation over the medial aspect of the lateral aspect.  No severe tenderness to palpation over the lateral or medial malleolus.  No severe tenderness palpation throughout the left foot.  Specialty Comments:  No specialty comments available.  Imaging: No results found.   PMFS History: Patient Active Problem List   Diagnosis Date Noted  . GERD (gastroesophageal reflux disease) 05/31/2019  . Solitary kidney, acquired 05/10/2019  . Carotid arterial disease ([place]) 12/11/2018  . COPD, mild ([place]) 10/01/2018  . CKD (chronic kidney disease) stage 3, GFR 30-59 ml/min 10/01/2018  .  Aortic valve sclerosis 10/01/2018  . Hypertension 09/11/2018   Past Medical History:  Diagnosis Date  . Allergy   . Depression   . Gout   . Hyperlipidemia   . Hypertension   . 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 Hypertension Mother   . Heart disease Mother        Cardiac Arrest   . Hypertension Father   . Hypertension Sister   . Cancer Brother        Bladder Cancer   . Hypertension Brother     Past Surgical History:  Procedure Laterality Date  . ABDOMINAL HYSTERECTOMY    . CHOLECYSTECTOMY N/A 01/28/2015   Procedure:  LAPAROSCOPIC CHOLECYSTECTOMY;  Surgeon: Aviva Signs Md, MD;  Location: [facility];  Service: General;  Laterality: N/A;  . excision of bone spurs Bilateral    Big toes  . renal aneurysm Right    right coil-and only has function of left kidney  . X-STOP IMPLANTATION     Social History   Occupational History  . Occupation: Retired  Tobacco Use  . Smoking status: Former Smoker    Packs/day: 0.25    Years: 20.00    Pack years: 5.00    Types: Cigarettes  . Smokeless tobacco: Never Used  Vaping Use  . Vaping Use: Never used  Substance and Sexual Activity  . Alcohol use: No  . Drug use: No  . Sexual activity: Yes    Birth control/protection: Surgical

## 2020-03-22 ENCOUNTER — Encounter: Payer: Self-pay | Admitting: Orthopedic Surgery

## 2020-03-23 ENCOUNTER — Ambulatory Visit (INDEPENDENT_AMBULATORY_CARE_PROVIDER_SITE_OTHER): Payer: Medicare Other | Admitting: Orthopedic Surgery

## 2020-03-23 DIAGNOSIS — M19072 Primary osteoarthritis, left ankle and foot: Secondary | ICD-10-CM

## 2020-03-31 ENCOUNTER — Encounter: Payer: Self-pay | Admitting: Orthopedic Surgery

## 2020-03-31 DIAGNOSIS — M19072 Primary osteoarthritis, left ankle and foot: Secondary | ICD-10-CM | POA: Diagnosis not present

## 2020-03-31 MED ORDER — METHYLPREDNISOLONE ACETATE 40 MG/ML IJ SUSP
13.3300 mg | INTRAMUSCULAR | Status: AC | PRN
  Administered 2020-03-31: 13.33 mg via INTRA_ARTICULAR

## 2020-03-31 MED ORDER — LIDOCAINE HCL 1 % IJ SOLN
5.0000 mL | INTRAMUSCULAR | Status: AC | PRN
  Administered 2020-03-31: 5 mL

## 2020-03-31 MED ORDER — BUPIVACAINE HCL 0.25 % IJ SOLN
0.6600 mL | INTRAMUSCULAR | Status: AC | PRN
  Administered 2020-03-31: .66 mL via INTRA_ARTICULAR

## 2020-03-31 NOTE — Progress Notes (Signed)
   Procedure Note  Patient: Jaclyn Brooks             Date of Birth: 1961/05/02           MRN: [id number]             Visit Date: 03/23/2020  Procedures: Visit Diagnoses:  1. Primary osteoarthritis, left ankle and foot     Medium Joint Inj: L ankle on 03/31/2020 4:41 PM Details: 22 G 1.5 in needle, ultrasound-guided anterolateral approach Medications: 5 mL lidocaine 1 %; 13.33 mg methylPREDNISolone acetate 40 MG/ML; 0.66 mL bupivacaine 0.25 %     Patient presents for scheduled left ankle injection under ultrasound guidance.  Tolerated the procedure well.

## 2020-04-27 ENCOUNTER — Ambulatory Visit: Payer: Medicare Other | Admitting: Nurse Practitioner

## 2020-05-01 ENCOUNTER — Other Ambulatory Visit: Payer: Self-pay | Admitting: Family Medicine

## 2020-05-04 ENCOUNTER — Ambulatory Visit (INDEPENDENT_AMBULATORY_CARE_PROVIDER_SITE_OTHER): Payer: Medicare Other | Admitting: Nurse Practitioner

## 2020-05-04 ENCOUNTER — Other Ambulatory Visit: Payer: Self-pay

## 2020-05-04 VITALS — BP 100/60 | HR 45 | Temp 97.6°F | Resp 18 | Wt 188.4 lb

## 2020-05-04 DIAGNOSIS — M792 Neuralgia and neuritis, unspecified: Secondary | ICD-10-CM

## 2020-05-04 DIAGNOSIS — R399 Unspecified symptoms and signs involving the genitourinary system: Secondary | ICD-10-CM | POA: Diagnosis not present

## 2020-05-04 LAB — URINALYSIS, ROUTINE W REFLEX MICROSCOPIC
Bilirubin Urine: NEGATIVE
Glucose, UA: NEGATIVE
Hgb urine dipstick: NEGATIVE
Ketones, ur: NEGATIVE
Leukocytes,Ua: NEGATIVE
Nitrite: NEGATIVE
Protein, ur: NEGATIVE
Specific Gravity, Urine: 1.015 (ref 1.001–1.03)
pH: 6 (ref 5.0–8.0)

## 2020-05-04 MED ORDER — GABAPENTIN 100 MG PO CAPS: 100.0000 mg | ORAL_CAPSULE | Freq: Three times a day (TID) | ORAL | 0 refills | Status: DC | PRN

## 2020-05-04 MED ORDER — VALACYCLOVIR HCL 1 G PO TABS: 1000.0000 mg | ORAL_TABLET | Freq: Two times a day (BID) | ORAL | 0 refills | Status: DC

## 2020-05-04 NOTE — Progress Notes (Signed)
Established Patient Office Visit  Subjective:  Patient ID: Jaclyn Brooks, female    DOB: 1961-04-25  Age: 59 y.o. MRN: [id number]  CC:  Chief Complaint  Patient presents with   Flank Pain    R side, buring sensation, radiates to shoulder, started x2 weeks, tylenol was taken    HPI MELAINIE Brooks is a 59 year old female presenting to clinic with sxs that started 2 weeks ago of a burning sensation to her skin to her right side that radiates towards the right shoulder. She has tried tylenol without any relief. No known injury's. No skin rash or breakdown. No exposures or reactions. She has not had shingrex/zoster vaccine or ever had shingles. No fever or chills.   Past Medical History:  Diagnosis Date   Allergy    Depression    Gout    Hyperlipidemia    Hypertension    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HCC)     Past Surgical History:  Procedure Laterality Date   ABDOMINAL HYSTERECTOMY     CHOLECYSTECTOMY N/A 01/28/2015   Procedure: LAPAROSCOPIC CHOLECYSTECTOMY;  Surgeon: Aviva Signs Md, MD;  Location: [facility];  Service: General;  Laterality: N/A;   excision of bone spurs Bilateral    Big toes   renal aneurysm Right    right coil-and only has function of left kidney   X-STOP IMPLANTATION      Family History  Problem Relation Age of Onset   Hypertension Mother    Heart disease Mother        Cardiac Arrest    Hypertension Father    Hypertension Sister    Cancer Brother        Bladder Cancer    Hypertension Brother     Social History   Socioeconomic History   Marital status: Single    Spouse name: Not on file   Number of children: Not on file   Years of education: Not on file   Highest education level: Not on file  Occupational History   Occupation: Retired  Tobacco Use   Smoking status: Former Smoker    Packs/day: 0.25    Years: 20.00    Pack years: 5.00    Types: Cigarettes   Smokeless tobacco: Never Used  Scientific laboratory technician Use: Never  used  Substance and Sexual Activity   Alcohol use: No   Drug use: No   Sexual activity: Yes    Birth control/protection: Surgical  Other Topics Concern   Not on file  Social History Narrative   Not on file   Social Determinants of Health   Financial Resource Strain:    Difficulty of Paying Living Expenses:   Food Insecurity:    Worried About Charity fundraiser in the Last Year:    Arboriculturist in the Last Year:   Transportation Needs:    Film/video editor (Medical):    Lack of Transportation (Non-Medical):   Physical Activity:    Days of Exercise per Week:    Minutes of Exercise per Session:   Stress:    Feeling of Stress :   Social Connections:    Frequency of Communication with Friends and Family:    Frequency of Social Gatherings with Friends and Family:    Attends Religious Services:    Active Member of Clubs or Organizations:    Attends Archivist Meetings:    Marital Status:   Intimate Partner Violence:    Fear of Current  or Ex-Partner:    Emotionally Abused:    Physically Abused:    Sexually Abused:     Outpatient Medications Prior to Visit  Medication Sig Dispense Refill   albuterol (PROVENTIL HFA;VENTOLIN HFA) 108 (90 Base) MCG/ACT inhaler Inhale into the lungs every 6 (six) hours as needed for wheezing or shortness of breath.     atenolol (TENORMIN) 50 MG tablet Take 1 tablet (50 mg total) by mouth daily. 90 tablet 1   clopidogrel (PLAVIX) 75 MG tablet Take 1 tablet (75 mg total) by mouth every evening. 90 tablet 3   Diclofenac Sodium CR 100 MG 24 hr tablet Take 1 tablet by mouth once daily 30 tablet 0   diltiazem (CARTIA XT) 240 MG 24 hr capsule Take 1 capsule (240 mg total) by mouth daily. 90 capsule 1   ondansetron (ZOFRAN) 4 MG tablet Take 1 tablet (4 mg total) by mouth every 8 (eight) hours as needed for nausea or vomiting. 10 tablet 0   pantoprazole (PROTONIX) 40 MG tablet Take 1 tablet by mouth once  daily 90 tablet 1   triamcinolone cream (KENALOG) 0.1 % Apply 1 application topically 2 (two) times daily. To affected areas on arms and legs 30 g 0   acetaminophen-codeine (TYLENOL #3) 300-30 MG tablet Take 1 tablet by mouth daily as needed for moderate pain. 20 tablet 0   budesonide-formoterol (SYMBICORT) 80-4.5 MCG/ACT inhaler Inhale 2 puffs into the lungs 2 (two) times daily. 1 Inhaler 3   triamterene-hydrochlorothiazide (MAXZIDE-25) 37.5-25 MG tablet Take 1 tablet by mouth daily. 90 tablet 3   No facility-administered medications prior to visit.    Allergies  Allergen Reactions   Bee Venom Hives   Nsaids     Kidney disease    Shellfish Allergy Itching    ROS Review of Systems  All other systems reviewed and are negative.     Objective:    Physical Exam Vitals and nursing note reviewed.  Constitutional:      General: She is not in acute distress.    Appearance: Normal appearance. She is well-developed and well-groomed. She is not ill-appearing, toxic-appearing or diaphoretic.  HENT:     Head: Normocephalic.     Right Ear: Hearing and external ear normal.     Left Ear: Hearing and external ear normal.  Eyes:     General: Lids are normal. Lids are everted, no foreign bodies appreciated.     Extraocular Movements: Extraocular movements intact.     Conjunctiva/sclera: Conjunctivae normal.     Pupils: Pupils are equal, round, and reactive to light.  Neck:     Vascular: No JVD.  Cardiovascular:     Rate and Rhythm: Normal rate.  Pulmonary:     Effort: Pulmonary effort is normal.  Abdominal:     General: Abdomen is flat. Bowel sounds are normal.     Tenderness: There is no abdominal tenderness. There is no right CVA tenderness, left CVA tenderness or guarding.  Musculoskeletal:     Cervical back: Full passive range of motion without pain, normal range of motion and neck supple.     Right lower leg: No edema.     Left lower leg: No edema.  Skin:    General:  Skin is warm and dry.     Capillary Refill: Capillary refill takes less than 2 seconds.     Coloration: Skin is not jaundiced or pale.     Findings: No bruising, erythema, lesion or rash.  Neurological:     General: No focal deficit present.     Mental Status: She is alert and oriented to person, place, and time.  Psychiatric:        Mood and Affect: Mood normal.        Behavior: Behavior normal. Behavior is cooperative.        Thought Content: Thought content normal.        Judgment: Judgment normal.     BP (!) 100/60 (BP Location: Right Arm, Patient Position: Sitting, Cuff Size: Normal)    Pulse 45    Temp 97.6 F (36.4 C) (Temporal)    Resp 18    Wt 188 lb 6.4 oz (85.5 kg)    SpO2 99%    BMI 30.41 kg/m  Wt Readings from Last 3 Encounters:  05/11/20 190 lb (86.2 kg)  05/04/20 188 lb 6.4 oz (85.5 kg)  03/16/20 180 lb (81.6 kg)     Health Maintenance Due  Topic Date Due   MAMMOGRAM  Never done   COLONOSCOPY  Never done    There are no preventive care reminders to display for this patient.  Lab Results  Component Value Date   TSH 1.74 09/11/2018   Lab Results  Component Value Date   WBC 5.5 08/09/2019   HGB 13.5 08/09/2019   HCT 39.8 08/09/2019   MCV 99.5 08/09/2019   PLT 286 08/09/2019   Lab Results  Component Value Date   NA 139 08/09/2019   K 3.8 08/09/2019   CO2 22 08/09/2019   GLUCOSE 162 (H) 08/09/2019   BUN 25 08/09/2019   CREATININE 1.73 (H) 08/09/2019   BILITOT 0.5 08/09/2019   ALKPHOS 60 01/26/2015   AST 12 08/09/2019   ALT 14 08/09/2019   PROT 7.2 08/09/2019   ALBUMIN 4.0 01/26/2015   CALCIUM 9.8 08/09/2019   ANIONGAP 10 01/26/2015   Lab Results  Component Value Date   CHOL 166 08/09/2019   Lab Results  Component Value Date   HDL 28 (L) 08/09/2019   Lab Results  Component Value Date   LDLCALC 104 (H) 08/09/2019   Lab Results  Component Value Date   TRIG 220 (H) 08/09/2019   Lab Results  Component Value Date   CHOLHDL  5.9 (H) 08/09/2019   Lab Results  Component Value Date   HGBA1C 5.9 (H) 08/09/2019      Assessment & Plan:   Problem List Items Addressed This Visit    None    Visit Diagnoses    Neuralgia of flank, right    -  Primary   UTI symptoms       Relevant Orders   Urinalysis, Routine w reflex microscopic (Completed)   Neuralgia of right flank        your sxs are consistent with prodromal shingles neuralgia. Treatment with antiviral and medication specific to this type of discomfort.     Follow-up: Return if symptoms worsen or fail to improve.    Annie Main, FNP

## 2020-05-11 ENCOUNTER — Ambulatory Visit (INDEPENDENT_AMBULATORY_CARE_PROVIDER_SITE_OTHER): Payer: Medicare Other | Admitting: Family Medicine

## 2020-05-11 ENCOUNTER — Encounter: Payer: Self-pay | Admitting: Family Medicine

## 2020-05-11 ENCOUNTER — Other Ambulatory Visit: Payer: Self-pay

## 2020-05-11 ENCOUNTER — Ambulatory Visit: Payer: Medicare Other | Admitting: Family Medicine

## 2020-05-11 VITALS — BP 118/64 | HR 66 | Temp 98.1°F | Resp 14 | Ht 66.0 in | Wt 190.0 lb

## 2020-05-11 DIAGNOSIS — J449 Chronic obstructive pulmonary disease, unspecified: Secondary | ICD-10-CM | POA: Diagnosis not present

## 2020-05-11 DIAGNOSIS — M19071 Primary osteoarthritis, right ankle and foot: Secondary | ICD-10-CM

## 2020-05-11 MED ORDER — BUDESONIDE-FORMOTEROL FUMARATE 80-4.5 MCG/ACT IN AERO: 2.0000 | INHALATION_SPRAY | Freq: Two times a day (BID) | RESPIRATORY_TRACT | 3 refills | Status: DC

## 2020-05-11 MED ORDER — TRAMADOL HCL 50 MG PO TABS: 50.0000 mg | ORAL_TABLET | Freq: Three times a day (TID) | ORAL | 1 refills | Status: AC | PRN

## 2020-05-11 NOTE — Patient Instructions (Signed)
F/u November for Physical

## 2020-05-11 NOTE — Progress Notes (Signed)
   Subjective:    Patient ID: Jaclyn Brooks, female    DOB: 07/10/1961, 59 y.o.   MRN: [id number]  Patient presents for  R Sided Neuralgia (much improved, but gabapenitn made her very sedate )  Pt treated for neuralgia on right side treatment was for possible underlying shingles based on her symptoms.  She is prescribed Valtrex and gabapentin.  She took it for a week The gabapentin made her sleepy and in a haze, stopped both yesterday  pain improved     COPD- has not been on symbicort   for the past week andnoticed a change in her breathing like she needs her inhalers again.  She is not having significant cough or congestion but a little short of breath at times.       Review Of Systems:  GEN- denies fatigue, fever, weight loss,weakness, recent illness HEENT- denies eye drainage, change in vision, nasal discharge, CVS- denies chest pain, palpitations RESP- denies SOB, cough, wheeze ABD- denies N/V, change in stools, abd pain GU- denies dysuria, hematuria, dribbling, incontinence MSK- + joint pain, muscle aches, injury Neuro- denies headache, dizziness, syncope, seizure activity       Objective:    BP 118/64   Pulse 66   Temp 98.1 F (36.7 C) (Temporal)   Resp 14   Ht 5\' 6"  (1.676 m)   Wt 190 lb (86.2 kg)   SpO2 98%   BMI 30.67 kg/m  GEN- NAD, alert and oriented x3 HEENT- PERRL, EOMI, non injected sclera, pink conjunctiva, MMM, oropharynx clear Neck- Supple, no thyromegaly CVS- RRR, no murmur RESP-CTAB ABD-NABS,soft,NT,ND Skin intact no rash noted EXT- No edema Pulses- Radial, DP- 2+        Assessment & Plan:      Problem List Items Addressed This Visit      Unprioritized   COPD, mild (HCC) - Primary    Restart Symbicort discussed the importance of maintenance inhaler.  She does not like to use the albuterol because of the side effects.      Relevant Medications   budesonide-formoterol (SYMBICORT) 80-4.5 MCG/ACT inhaler   Primary osteoarthritis of  right foot    He has known osteoarthritis of her foot.  She did taking BC powders which we do not recommend in the setting of her reflux and her solitary renal status with some chronic kidney disease.  We will give her tramadol to have on hand for any pain.  Tylenol has not been helping.  Note the previous neuralgia on her side has resolved she can discontinue the Valtrex and gabapentin      Relevant Medications   traMADol (ULTRAM) 50 MG tablet      Note: This dictation was prepared with Dragon dictation along with smaller phrase technology. Any transcriptional errors that result from this process are unintentional.

## 2020-05-12 ENCOUNTER — Encounter: Payer: Self-pay | Admitting: Family Medicine

## 2020-05-12 DIAGNOSIS — M19071 Primary osteoarthritis, right ankle and foot: Secondary | ICD-10-CM | POA: Insufficient documentation

## 2020-05-12 NOTE — Assessment & Plan Note (Signed)
Restart Symbicort discussed the importance of maintenance inhaler.  She does not like to use the albuterol because of the side effects.

## 2020-05-12 NOTE — Assessment & Plan Note (Signed)
He has known osteoarthritis of her foot.  She did taking BC powders which we do not recommend in the setting of her reflux and her solitary renal status with some chronic kidney disease.  We will give her tramadol to have on hand for any pain.  Tylenol has not been helping.  Note the previous neuralgia on her side has resolved she can discontinue the Valtrex and gabapentin

## 2020-05-13 NOTE — Patient Instructions (Signed)
Neuralgia of right flank        your sxs are consistent with prodromal shingles neuralgia. Treatment with antiviral and medication specific to this type of discomfort

## 2020-05-25 ENCOUNTER — Other Ambulatory Visit: Payer: Self-pay | Admitting: Family Medicine

## 2020-05-28 ENCOUNTER — Other Ambulatory Visit: Payer: Self-pay | Admitting: Family Medicine

## 2020-05-29 ENCOUNTER — Telehealth: Payer: Self-pay | Admitting: *Deleted

## 2020-05-29 MED ORDER — DOXYCYCLINE HYCLATE 100 MG PO TABS: 100.0000 mg | ORAL_TABLET | Freq: Two times a day (BID) | ORAL | 0 refills | Status: DC

## 2020-05-29 NOTE — Telephone Encounter (Signed)
Pt with history of boils  I have sent doxycyline 100mg  BID to pharmacy Schedule OV next week if not improving with antibiotics for I&D  Continue warm compresses

## 2020-05-29 NOTE — Telephone Encounter (Signed)
Received call from patient.   Reports that she discussed abscess with PCP at appointment on 8/2, but area did not require I&D at that time. States that she has been using warm compresses, but area will not resolve or burst.   Requested ABTx.   Please advise.

## 2020-05-29 NOTE — Telephone Encounter (Signed)
Patient returned call and made aware.

## 2020-05-29 NOTE — Telephone Encounter (Signed)
Call placed to patient. No answer. VM full. 

## 2020-05-30 ENCOUNTER — Other Ambulatory Visit: Payer: Self-pay | Admitting: Family Medicine

## 2020-06-11 IMAGING — US BILATERAL CAROTID DUPLEX ULTRASOUND
1 series · 13 of 24 positions shown · non-contrast
Comparison: None available

CLINICAL DATA: Right carotid occlusion, left carotid bruit

EXAM:
BILATERAL CAROTID DUPLEX ULTRASOUND
TECHNIQUE: Gray scale imaging, color Doppler and duplex ultrasound were
performed of bilateral carotid and vertebral arteries in the neck.

[Series 1: bilateral carotid duplex ultrasound · 13 of 69 slices shown]
[im 1/69]
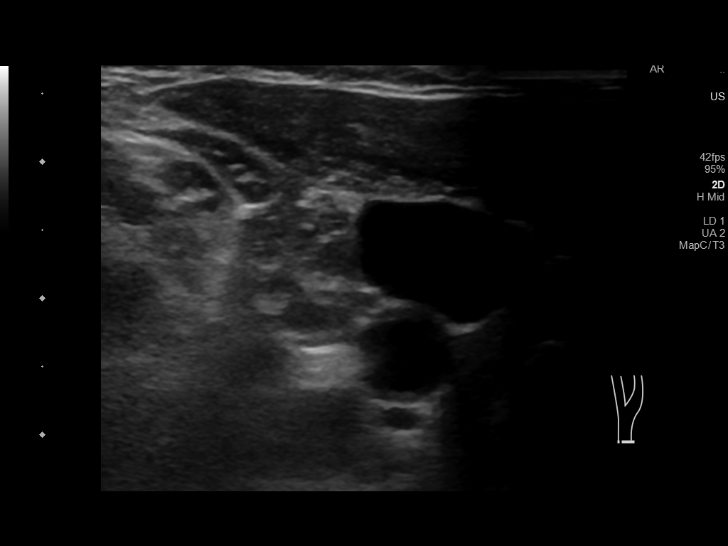
[im 6/69]
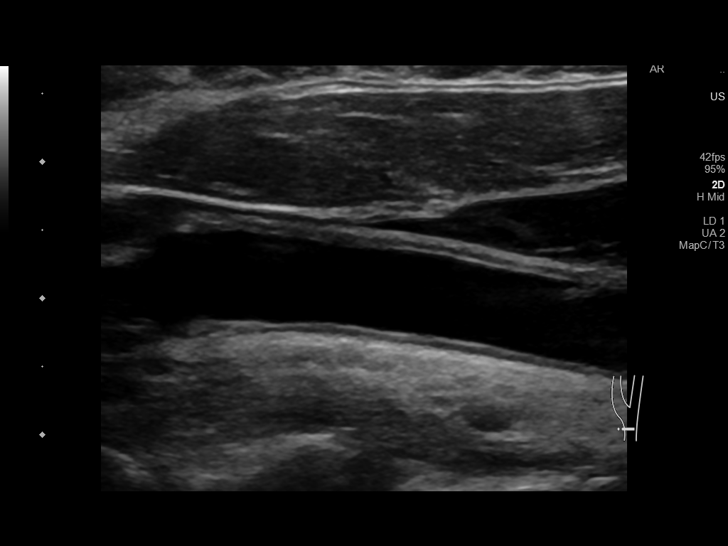
[im 12/69]
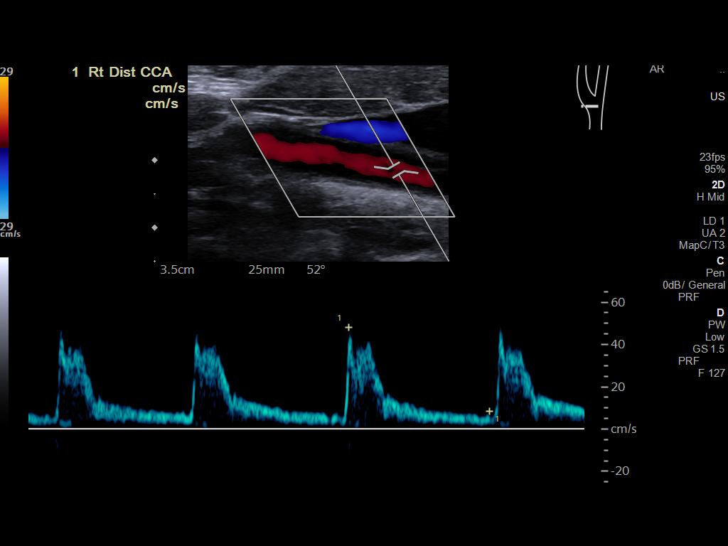
[im 18/69]
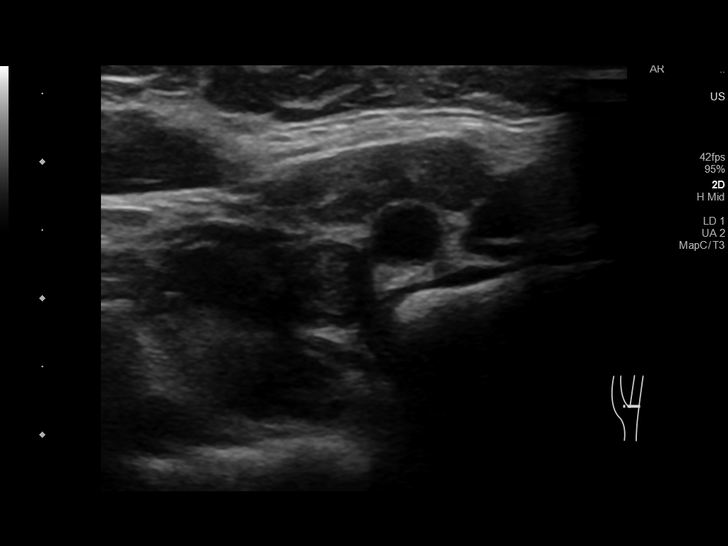
[im 24/69]
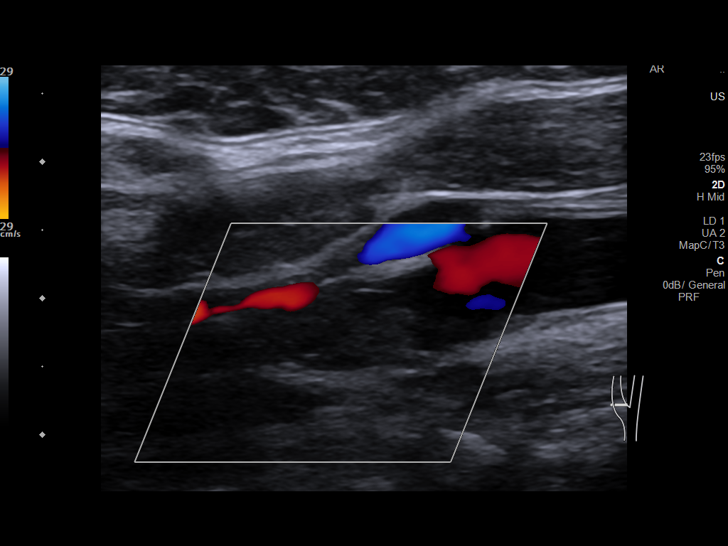
[im 30/69]
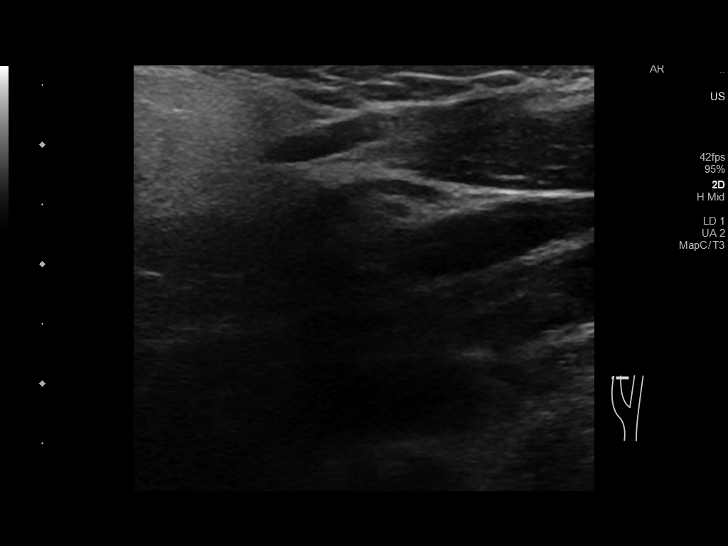
[im 36/69]
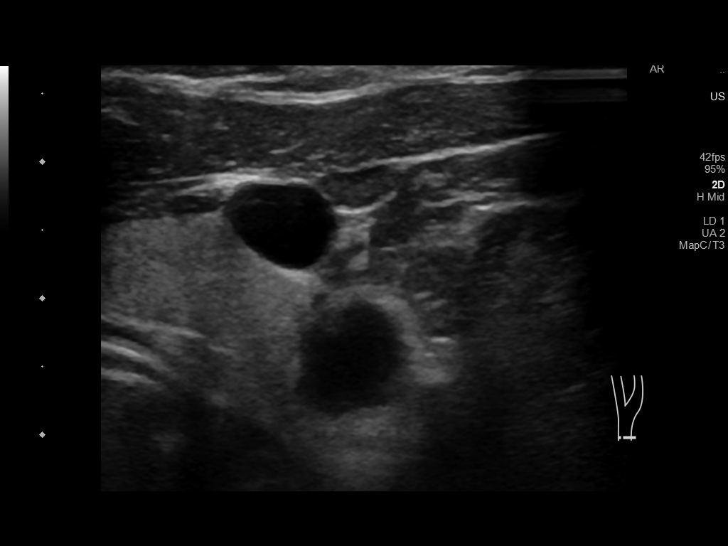
[im 39/69]
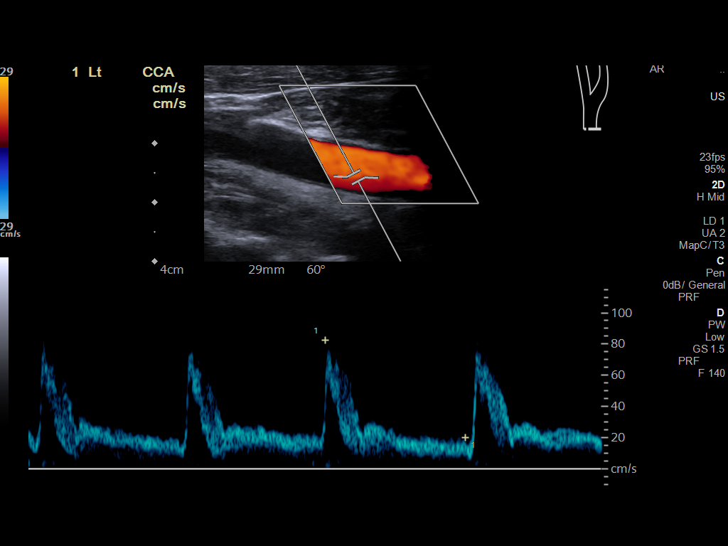
[im 45/69]
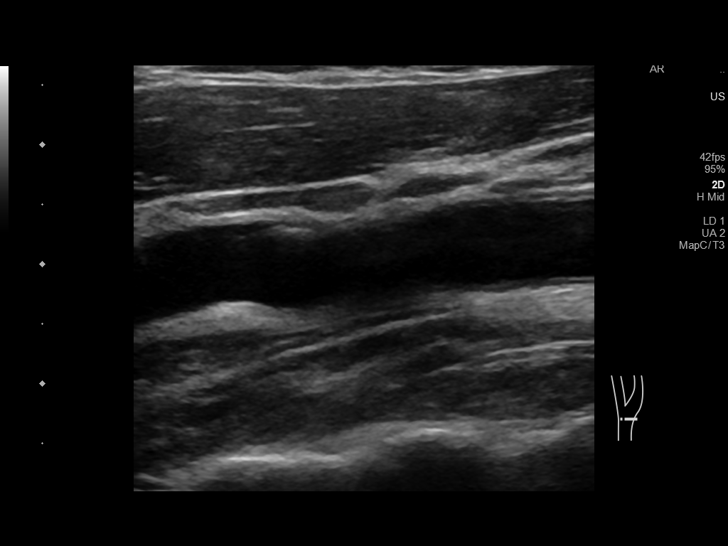
[im 51/69]
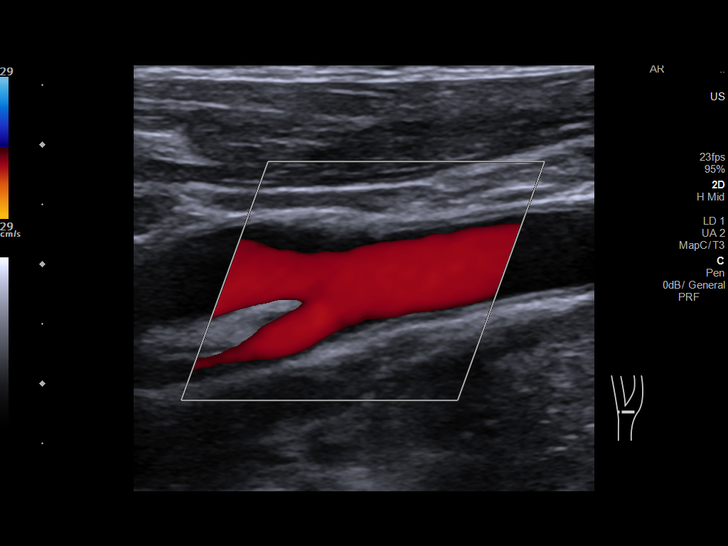
[im 57/69]
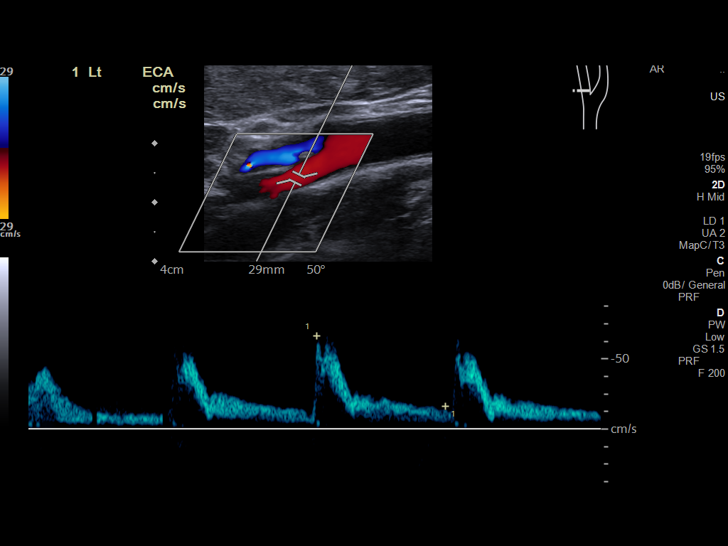
[im 63/69]
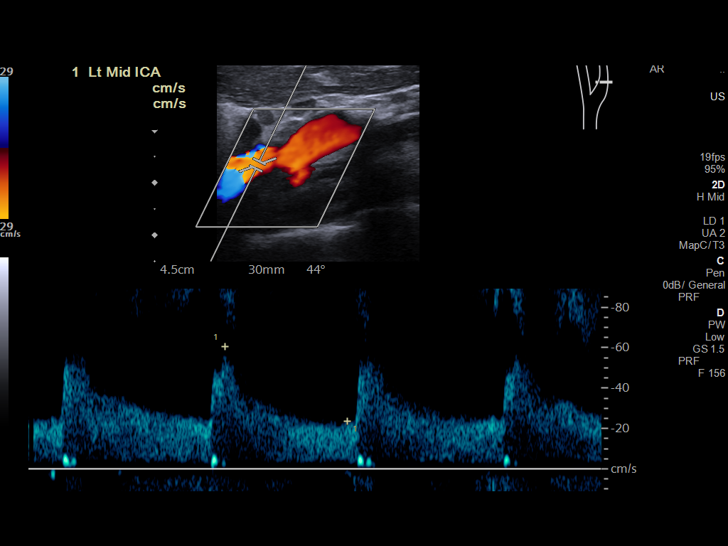
[im 69/69]
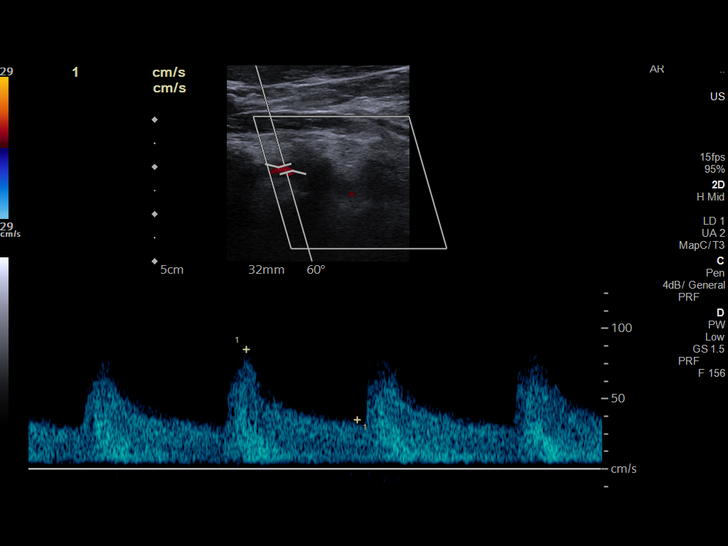

[13 of 24 positions shown; findings below may reference images not displayed]

FINDINGS: Criteria: Quantification of carotid stenosis is based on velocity
parameters that correlate the residual internal carotid diameter
with NASCET-based stenosis levels, using the diameter of the distal
internal carotid lumen as the denominator for stenosis measurement.

The following velocity measurements were obtained:

RIGHT

ICA: Occluded

CCA: 52/8 cm/sec

SYSTOLIC ICA/CCA RATIO:  Not applicable

ECA: 76 cm/sec

LEFT

ICA: 61/24 cm/sec

CCA: 60/20 cm/sec

SYSTOLIC ICA/CCA RATIO:

ECA: 66 cm/sec

RIGHT CAROTID ARTERY: Right CCA and ECA remain patent with plaque
formation. Chronic occlusion of the right proximal ICA again
evident.

RIGHT VERTEBRAL ARTERY:  Antegrade

LEFT CAROTID ARTERY: Minor left carotid intimal thickening and
atherosclerosis. No hemodynamically significant left ICA stenosis,
velocity elevation, turbulent flow. Degree of narrowing less than
50%.

LEFT VERTEBRAL ARTERY:  Antegrade
IMPRESSION: Chronic right ICA occlusion

Minor left ICA narrowing, less than 50% by ultrasound criteria

Patent antegrade vertebral flow bilaterally

## 2020-06-19 ENCOUNTER — Ambulatory Visit (INDEPENDENT_AMBULATORY_CARE_PROVIDER_SITE_OTHER): Payer: Medicare Other | Admitting: Family Medicine

## 2020-06-19 ENCOUNTER — Other Ambulatory Visit: Payer: Self-pay

## 2020-06-19 ENCOUNTER — Encounter: Payer: Self-pay | Admitting: Family Medicine

## 2020-06-19 VITALS — BP 132/78 | HR 62 | Temp 97.9°F | Resp 16 | Ht 66.0 in | Wt 185.0 lb

## 2020-06-19 DIAGNOSIS — M1711 Unilateral primary osteoarthritis, right knee: Secondary | ICD-10-CM | POA: Diagnosis not present

## 2020-06-19 DIAGNOSIS — L720 Epidermal cyst: Secondary | ICD-10-CM

## 2020-06-19 DIAGNOSIS — N1832 Chronic kidney disease, stage 3b: Secondary | ICD-10-CM

## 2020-06-19 DIAGNOSIS — R110 Nausea: Secondary | ICD-10-CM | POA: Diagnosis not present

## 2020-06-19 DIAGNOSIS — I10 Essential (primary) hypertension: Secondary | ICD-10-CM

## 2020-06-19 DIAGNOSIS — K219 Gastro-esophageal reflux disease without esophagitis: Secondary | ICD-10-CM | POA: Diagnosis not present

## 2020-06-19 DIAGNOSIS — R7309 Other abnormal glucose: Secondary | ICD-10-CM | POA: Diagnosis not present

## 2020-06-19 MED ORDER — DEXILANT 60 MG PO CPDR: 60.0000 mg | DELAYED_RELEASE_CAPSULE | Freq: Every day | ORAL | 3 refills | Status: DC

## 2020-06-19 MED ORDER — ONDANSETRON HCL 4 MG PO TABS: 4.0000 mg | ORAL_TABLET | Freq: Three times a day (TID) | ORAL | 0 refills | Status: DC | PRN

## 2020-06-19 MED ORDER — HYDROCODONE-ACETAMINOPHEN 5-325 MG PO TABS: 1.0000 | ORAL_TABLET | Freq: Four times a day (QID) | ORAL | 0 refills | Status: DC | PRN

## 2020-06-19 NOTE — Patient Instructions (Addendum)
No Diclofenac 100mg   Take the dexilant once a day for reflux symptoms  Take hydrocodone for the arthritis Referral to surgeon for the cyst Call Dr. Marlou Sa for appointment for your knee  F/U as previous

## 2020-06-19 NOTE — Progress Notes (Signed)
Subjective:    Patient ID: Jaclyn Brooks, female    DOB: [birth year], 59 y.o.   MRN: [id number]  Patient presents for R Knee Pain (x3 weeks- intermittent), Knot to L Arm (upper shoulder), and Nausea (intermittent nausea that wakes her up)  Pt here with multiple concerns:  Right knee pain for the past 3 weeks, she is on CR diclofenac which she only takes a few times a week , she has swelling and aching in the knee No injury. It does pop as well  She is already followed by Dr. Marlou Sa orthopedics    She typically has nausea at night occ during day. Symptoms started 1 week after eating at a labor day cookout. She has tried  She has reflux symptoms  She has been taking tums Protonix has not helped so she stopped taking it   She has knot on top of her left arm, she does get some aching in her left arm, that radiates down This has been present for years    Review Of Systems:  GEN- denies fatigue, fever, weight loss,weakness, recent illness HEENT- denies eye drainage, change in vision, nasal discharge, CVS- denies chest pain, palpitations RESP- denies SOB, cough, wheeze ABD- denies N/V, change in stools, abd pain GU- denies dysuria, hematuria, dribbling, incontinence MSK- + joint pain, muscle aches, injury Neuro- denies headache, dizziness, syncope, seizure activity       Objective:    BP 132/78    Pulse 62    Temp 97.9 F (36.6 C) (Temporal)    Resp 16    Ht 5\' 6"  (1.676 m)    Wt 185 lb (83.9 kg)    SpO2 99%    BMI 29.86 kg/m  GEN- NAD, alert and oriented x3 HEENT- PERRL, EOMI, non injected sclera, pink conjunctiva, MMM, oropharynx clear Neck- Supple, no thyromegaly CVS- RRR, no murmur RESP-CTAB ABD-NABS,soft,NT,ND MSK- Fair ROM right knee, no erythema, no warmth, no effusion, ligmaments grossly in tact Skin- cyst top of left shoulder with black core at center, mild TTP, no erythema, no fluctance  EXT- No edema Pulses- Radial, DP- 2+        Assessment & Plan:       Problem List Items Addressed This Visit      Unprioritized   CKD (chronic kidney disease) stage 3, GFR 30-59 ml/min - Primary   GERD (gastroesophageal reflux disease)    D/c NSAIDS Start dexilant zofran prn nausea No red flags on exam       Relevant Medications   dexlansoprazole (DEXILANT) 60 MG capsule   ondansetron (ZOFRAN) 4 MG tablet   Hypertension    Controlled no changes       Relevant Medications   triamterene-hydrochlorothiazide (MAXZIDE-25) 37.5-25 MG tablet   Other Relevant Orders   CBC with Differential/Platelet (Completed)   Comprehensive metabolic panel (Completed)   Lipid panel (Completed)   TSH (Completed)    Other Visit Diagnoses    Primary osteoarthritis of right knee       Pt to call her orthopedic, due to CKD and reflux, stop the NSAIDS, given norco    Relevant Medications   HYDROcodone-acetaminophen (NORCO) 5-325 MG tablet   Epidermoid cyst of skin of shoulder       Relevant Orders   Ambulatory referral to General Surgery   Nausea       Relevant Orders   Lipase (Completed)      Note: This dictation was prepared with Dragon dictation along with smaller phrase technology.  Any transcriptional errors that result from this process are unintentional.

## 2020-06-21 ENCOUNTER — Encounter: Payer: Self-pay | Admitting: Family Medicine

## 2020-06-21 NOTE — Assessment & Plan Note (Signed)
D/c NSAIDS Start dexilant zofran prn nausea No red flags on exam

## 2020-06-21 NOTE — Assessment & Plan Note (Signed)
Controlled no changes 

## 2020-06-23 LAB — COMPREHENSIVE METABOLIC PANEL
AG Ratio: 1.3 (calc) (ref 1.0–2.5)
ALT: 25 U/L (ref 6–29)
AST: 18 U/L (ref 10–35)
Albumin: 3.9 g/dL (ref 3.6–5.1)
Alkaline phosphatase (APISO): 64 U/L (ref 37–153)
BUN/Creatinine Ratio: 16 (calc) (ref 6–22)
BUN: 26 mg/dL — ABNORMAL HIGH (ref 7–25)
CO2: 24 mmol/L (ref 20–32)
Calcium: 10 mg/dL (ref 8.6–10.4)
Chloride: 103 mmol/L (ref 98–110)
Creat: 1.61 mg/dL — ABNORMAL HIGH (ref 0.50–0.99)
Globulin: 3.1 g/dL (calc) (ref 1.9–3.7)
Glucose, Bld: 205 mg/dL — ABNORMAL HIGH (ref 65–99)
Potassium: 4.3 mmol/L (ref 3.5–5.3)
Sodium: 137 mmol/L (ref 135–146)
Total Bilirubin: 0.3 mg/dL (ref 0.2–1.2)
Total Protein: 7 g/dL (ref 6.1–8.1)

## 2020-06-23 LAB — CBC WITH DIFFERENTIAL/PLATELET
Absolute Monocytes: 420 cells/uL (ref 200–950)
Basophils Absolute: 22 cells/uL (ref 0–200)
Basophils Relative: 0.4 %
Eosinophils Absolute: 241 cells/uL (ref 15–500)
Eosinophils Relative: 4.3 %
HCT: 38.6 % (ref 35.0–45.0)
Hemoglobin: 12.8 g/dL (ref 11.7–15.5)
Lymphs Abs: 1764 cells/uL (ref 850–3900)
MCH: 33.3 pg — ABNORMAL HIGH (ref 27.0–33.0)
MCHC: 33.2 g/dL (ref 32.0–36.0)
MCV: 100.5 fL — ABNORMAL HIGH (ref 80.0–100.0)
MPV: 10.1 fL (ref 7.5–12.5)
Monocytes Relative: 7.5 %
Neutro Abs: 3153 cells/uL (ref 1500–7800)
Neutrophils Relative %: 56.3 %
Platelets: 298 10*3/uL (ref 140–400)
RBC: 3.84 10*6/uL (ref 3.80–5.10)
RDW: 12.7 % (ref 11.0–15.0)
Total Lymphocyte: 31.5 %
WBC: 5.6 10*3/uL (ref 3.8–10.8)

## 2020-06-23 LAB — LIPID PANEL
Cholesterol: 157 mg/dL (ref <200)
HDL: 24 mg/dL — ABNORMAL LOW (ref >=50)
LDL Cholesterol (Calc): 92 mg/dL (calc)
Non-HDL Cholesterol (Calc): 133 mg/dL (calc) — ABNORMAL HIGH (ref <130)
Total CHOL/HDL Ratio: 6.5 (calc) — ABNORMAL HIGH (ref <5.0)
Triglycerides: 287 mg/dL — ABNORMAL HIGH (ref <150)

## 2020-06-23 LAB — TEST AUTHORIZATION

## 2020-06-23 LAB — HEMOGLOBIN A1C W/OUT EAG: Hgb A1c MFr Bld: 6.7 % of total Hgb — ABNORMAL HIGH (ref <5.7)

## 2020-06-23 LAB — LIPASE: Lipase: 46 U/L (ref 7–60)

## 2020-06-23 LAB — TSH: TSH: 2.43 mIU/L (ref 0.40–4.50)

## 2020-07-01 ENCOUNTER — Ambulatory Visit: Payer: Medicare Other | Admitting: Orthopedic Surgery

## 2020-07-03 ENCOUNTER — Other Ambulatory Visit: Payer: Self-pay | Admitting: *Deleted

## 2020-07-03 MED ORDER — BLOOD GLUCOSE TEST VI STRP
ORAL_STRIP | 3 refills | Status: DC
Start: 2020-07-03 — End: 2021-03-04

## 2020-07-03 MED ORDER — BLOOD GLUCOSE SYSTEM PAK KIT
PACK | 1 refills | Status: DC
Start: 2020-07-03 — End: 2021-03-04

## 2020-07-03 MED ORDER — LANCETS MISC
3 refills | Status: DC
Start: 2020-07-03 — End: 2021-03-04

## 2020-07-10 ENCOUNTER — Telehealth: Payer: Self-pay | Admitting: Family Medicine

## 2020-07-10 DIAGNOSIS — R194 Change in bowel habit: Secondary | ICD-10-CM

## 2020-07-10 DIAGNOSIS — K219 Gastro-esophageal reflux disease without esophagitis: Secondary | ICD-10-CM

## 2020-07-10 MED ORDER — DICLOFENAC SODIUM ER 100 MG PO TB24: 100.0000 mg | ORAL_TABLET | Freq: Every day | ORAL | 0 refills | Status: DC

## 2020-07-10 NOTE — Telephone Encounter (Signed)
Pt last visit had nausea reflux symptoms  No bowel symptoms, so I am not sure about the diarrhea episodes she was referring to   She can try immodium  Next step is GI - GERD, bowel changes

## 2020-07-10 NOTE — Addendum Note (Signed)
Addended by: Sheral Flow on: 07/10/2020 05:12 PM   Modules accepted: Orders

## 2020-07-10 NOTE — Telephone Encounter (Signed)
Call placed to patient and patient made aware.   Agreeable to referral.

## 2020-07-14 ENCOUNTER — Other Ambulatory Visit: Payer: Self-pay | Admitting: *Deleted

## 2020-07-14 ENCOUNTER — Ambulatory Visit (INDEPENDENT_AMBULATORY_CARE_PROVIDER_SITE_OTHER): Payer: Medicare Other | Admitting: Family Medicine

## 2020-07-14 ENCOUNTER — Other Ambulatory Visit: Payer: Self-pay

## 2020-07-14 ENCOUNTER — Encounter: Payer: Self-pay | Admitting: Family Medicine

## 2020-07-14 VITALS — BP 128/68 | HR 58 | Temp 98.7°F | Resp 14 | Ht 66.0 in | Wt 179.0 lb

## 2020-07-14 DIAGNOSIS — E860 Dehydration: Secondary | ICD-10-CM

## 2020-07-14 DIAGNOSIS — R197 Diarrhea, unspecified: Secondary | ICD-10-CM | POA: Diagnosis not present

## 2020-07-14 DIAGNOSIS — E119 Type 2 diabetes mellitus without complications: Secondary | ICD-10-CM

## 2020-07-14 DIAGNOSIS — K921 Melena: Secondary | ICD-10-CM | POA: Diagnosis not present

## 2020-07-14 DIAGNOSIS — E1169 Type 2 diabetes mellitus with other specified complication: Secondary | ICD-10-CM | POA: Insufficient documentation

## 2020-07-14 DIAGNOSIS — Z905 Acquired absence of kidney: Secondary | ICD-10-CM | POA: Diagnosis not present

## 2020-07-14 DIAGNOSIS — N1832 Chronic kidney disease, stage 3b: Secondary | ICD-10-CM

## 2020-07-14 LAB — CBC WITH DIFFERENTIAL/PLATELET
Absolute Monocytes: 645 cells/uL (ref 200–950)
Basophils Absolute: 20 cells/uL (ref 0–200)
Basophils Relative: 0.4 %
Eosinophils Absolute: 70 cells/uL (ref 15–500)
Eosinophils Relative: 1.4 %
HCT: 38.5 % (ref 35.0–45.0)
Hemoglobin: 12.9 g/dL (ref 11.7–15.5)
Lymphs Abs: 2115 cells/uL (ref 850–3900)
MCH: 32.9 pg (ref 27.0–33.0)
MCHC: 33.5 g/dL (ref 32.0–36.0)
MCV: 98.2 fL (ref 80.0–100.0)
MPV: 9.9 fL (ref 7.5–12.5)
Monocytes Relative: 12.9 %
Neutro Abs: 2150 cells/uL (ref 1500–7800)
Neutrophils Relative %: 43 %
Platelets: 398 10*3/uL (ref 140–400)
RBC: 3.92 10*6/uL (ref 3.80–5.10)
RDW: 12.7 % (ref 11.0–15.0)
Total Lymphocyte: 42.3 %
WBC: 5 10*3/uL (ref 3.8–10.8)

## 2020-07-14 LAB — COMPREHENSIVE METABOLIC PANEL
AG Ratio: 1.2 (calc) (ref 1.0–2.5)
ALT: 43 U/L — ABNORMAL HIGH (ref 6–29)
AST: 17 U/L (ref 10–35)
Albumin: 4.1 g/dL (ref 3.6–5.1)
Alkaline phosphatase (APISO): 77 U/L (ref 37–153)
BUN/Creatinine Ratio: 23 (calc) — ABNORMAL HIGH (ref 6–22)
BUN: 50 mg/dL — ABNORMAL HIGH (ref 7–25)
CO2: 24 mmol/L (ref 20–32)
Calcium: 10.1 mg/dL (ref 8.6–10.4)
Chloride: 103 mmol/L (ref 98–110)
Creat: 2.22 mg/dL — ABNORMAL HIGH (ref 0.50–0.99)
Globulin: 3.5 g/dL (calc) (ref 1.9–3.7)
Glucose, Bld: 89 mg/dL (ref 65–99)
Potassium: 4.1 mmol/L (ref 3.5–5.3)
Sodium: 136 mmol/L (ref 135–146)
Total Bilirubin: 0.4 mg/dL (ref 0.2–1.2)
Total Protein: 7.6 g/dL (ref 6.1–8.1)

## 2020-07-14 LAB — LIPASE: Lipase: 21 U/L (ref 7–60)

## 2020-07-14 MED ORDER — PROMETHAZINE HCL 12.5 MG PO TABS
12.5000 mg | ORAL_TABLET | Freq: Three times a day (TID) | ORAL | 0 refills | Status: DC | PRN
Start: 2020-07-14 — End: 2020-11-26

## 2020-07-14 NOTE — Progress Notes (Signed)
Subjective:    Patient ID: Jaclyn Brooks, female    DOB: 1961-04-28, 59 y.o.   MRN: [id number]  Patient presents for GI Issues (x1 month- dark stools, abd pain, blood on toliet paper, fatigue)   Pt here with ongping to GI issues   she was straining with bowel movements afew weeks , felt a swelling near her rectum  she started   drinkingblack hot tea, stool softener which helped but then she started having loose stools She noticed stools looked black at times, other times dark green, occ when she wiped had bright red blood She feels fatigued with no energy She is still taking her plavix   She tried immodium when she was havingmultiple BM, feels like her stomach was bubbling a lot , this helped some   Weight down 6lbs since Sept, she still has decreased appetite with nausea  but reflux symptoms are better from our visit in Sept   Note she is scheduled to see GI this Thursday for these symptoms She has NOT had colonoscopy   She has been trying to stay hydrated   Review Of Systems:  GEN- denies fatigue, fever,+ weight loss,weakness, recent illness HEENT- denies eye drainage, change in vision, nasal discharge, CVS- denies chest pain, palpitations RESP- denies SOB, cough, wheeze ABD- + N/ no V, +change in stools, abd pain GU- denies dysuria, hematuria, dribbling, incontinence MSK- denies joint pain, muscle aches, injury Neuro- denies headache, dizziness, syncope, seizure activity       Objective:    BP 128/68   Pulse (!) 58   Temp 98.7 F (37.1 C) (Temporal)   Resp 14   Ht 5\' 6"  (1.676 m)   Wt 179 lb (81.2 kg)   SpO2 99%   BMI 28.89 kg/m  GEN- NAD, alert and oriented x3 HEENT- PERRL, EOMI, non injected sclera, pink conjunctiva, MMM, oropharynx clear Neck- Supple, no thyromegaly CVS- RRR, no murmur RESP-CTAB ABD-NABS,soft,mild ttp epigastric , no reboung no guarding, mild TTP lower abd, no guarding, no mass palpated RECTUM- small external tag, abrasion skin 12 oclock  position above rectum , no tear seen , normal wink, FOBT not done EXT- No edema Pulses- Radial 2+        Assessment & Plan:      Problem List Items Addressed This Visit      Unprioritized   CKD (chronic kidney disease) stage 3, GFR 30-59 ml/min (HCC)   Diabetes mellitus without complication ([place])    Newly diagnosed diabetes mellitus with A1c returned at 6.7% back in September.  Did not start any medications at that time I want her to work on dietary changes this is well the same time she started having a lot of GI upset.  Her weight is down 6 pounds unintentionally.  She has decreased appetite with nausea.  I prescribed Phenergan today.  Concerned about her bowel changes and thinks that she needs colonoscopy which she is already scheduled to see GI this Thursday to have this set up.  I did check stat labs CBC and metabolic panel.  It looks like she is losing blood we will recommend she stop the Plavix for now.  She does have 1 functioning kidney so we will check renal function as well as she does have underlying CKD.  Try to keep hydrated.      Solitary kidney, acquired   Relevant Orders   CBC with Differential/Platelet   Comprehensive metabolic panel    Other Visit Diagnoses  Diarrhea, unspecified type    -  Primary   Relevant Orders   CBC with Differential/Platelet   Comprehensive metabolic panel   Lipase   Blood in stool       Relevant Orders   CBC with Differential/Platelet   Comprehensive metabolic panel      Note: This dictation was prepared with Dragon dictation along with smaller phrase technology. Any transcriptional errors that result from this process are unintentional.

## 2020-07-14 NOTE — Assessment & Plan Note (Signed)
Newly diagnosed diabetes mellitus with A1c returned at 6.7% back in September.  Did not start any medications at that time I want her to work on dietary changes this is well the same time she started having a lot of GI upset.  Her weight is down 6 pounds unintentionally.  She has decreased appetite with nausea.  I prescribed Phenergan today.  Concerned about her bowel changes and thinks that she needs colonoscopy which she is already scheduled to see GI this Thursday to have this set up.  I did check stat labs CBC and metabolic panel.  It looks like she is losing blood we will recommend she stop the Plavix for now.  She does have 1 functioning kidney so we will check renal function as well as she does have underlying CKD.  Try to keep hydrated.

## 2020-07-16 ENCOUNTER — Other Ambulatory Visit: Payer: Medicare Other

## 2020-07-16 ENCOUNTER — Telehealth (INDEPENDENT_AMBULATORY_CARE_PROVIDER_SITE_OTHER): Payer: Self-pay | Admitting: *Deleted

## 2020-07-16 ENCOUNTER — Other Ambulatory Visit: Payer: Self-pay

## 2020-07-16 ENCOUNTER — Encounter (INDEPENDENT_AMBULATORY_CARE_PROVIDER_SITE_OTHER): Payer: Self-pay | Admitting: *Deleted

## 2020-07-16 ENCOUNTER — Encounter (INDEPENDENT_AMBULATORY_CARE_PROVIDER_SITE_OTHER): Payer: Self-pay | Admitting: Gastroenterology

## 2020-07-16 ENCOUNTER — Ambulatory Visit (INDEPENDENT_AMBULATORY_CARE_PROVIDER_SITE_OTHER): Payer: Medicare Other | Admitting: Gastroenterology

## 2020-07-16 DIAGNOSIS — R197 Diarrhea, unspecified: Secondary | ICD-10-CM

## 2020-07-16 DIAGNOSIS — R110 Nausea: Secondary | ICD-10-CM

## 2020-07-16 DIAGNOSIS — E860 Dehydration: Secondary | ICD-10-CM

## 2020-07-16 DIAGNOSIS — R109 Unspecified abdominal pain: Secondary | ICD-10-CM | POA: Insufficient documentation

## 2020-07-16 DIAGNOSIS — R1013 Epigastric pain: Secondary | ICD-10-CM | POA: Diagnosis not present

## 2020-07-16 MED ORDER — PLENVU 140 G PO SOLR: 1.0000 | Freq: Once | ORAL | 0 refills | Status: AC

## 2020-07-16 MED ORDER — ONDANSETRON HCL 4 MG PO TABS: 4.0000 mg | ORAL_TABLET | Freq: Three times a day (TID) | ORAL | 1 refills | Status: DC | PRN

## 2020-07-16 NOTE — Patient Instructions (Signed)
Schedule EGD and colonoscopy - will obtain clearance from Dr. Servando Snare to stop Plavix 5 days prior Schedule CT abdomen/pelvis without IV contrast Perform stool test Can take Zofran as needed for nausea

## 2020-07-16 NOTE — Progress Notes (Signed)
Jaclyn Brooks, M.D. Gastroenterology & Hepatology [facility] [address] [place], [place] [address] Primary Care Physician: Alycia Rossetti, MD [address] [place]  [address]  Referring MD: PCP  I will communicate my assessment and recommendations to the referring MD via EMR. Note: Occasional unusual wording and randomly placed punctuation marks may result from the use of speech recognition technology to transcribe this document"  Chief Complaint: Constipation and diarrhea  History of Present Illness: Jaclyn Brooks is a 59 y.o. 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  The patient states that 2 weeks ago had an episode of constipation that lasted for 3 to 4 days after taking a dose of Norco for epigastric pain.  Patient states that she took a stool softener, hot milk and tea, she subsequently had diarrhea after having a bowel movement, which has persisted since then.  She states that for the last week she has had between 8-10 bowel movements per day with soft consistency but occasionally watery without any blood in the stool.  Has tried taking 1 tablet of Imodium a day which she reports has helped slightly improving her symptoms.  States that occasionally she has a little bit of blood with wiping but this is not frequent.  She has been also concerned as she has noted "bubbling sounds" in her abdomen since her symptoms started.  The patient states that she never had similar symptoms in the past as she used to have 1-2 bowel movements per day.  Regarding her abdominal pain, she reports having upper abdominal pain since early September which she is describing is pressure sensation intermittent in nature.  Due to this, she was prescribed Dexilant by her primary  care physician but she has not noticed any improvement in her symptomatology.  She was also prescribed Phenergan by her PCP which has improved slightly nausea sensation she has presented.  Patient states that she has lost weight on purpose after changing her diet, she has been avoiding eating potatoes, noodles, bread or fast food.  The patient denies having any vomiting, fever, chills, hematochezia, melena, hematemesis, jaundice, pruritus.  Last EGD: Never Last Colonoscopy: Never  FHx: neg for any gastrointestinal/liver disease, father had prostate cancer, he also had colon cancer at age 31 Social: neg smoking, alcohol or illicit drug use Surgical: Cholecystectomy in 2016  Past Medical History: Past Medical History:  Diagnosis Date  . Allergy   . Depression   . Gout   . Hyperlipidemia   . Hypertension   . Renal arterial aneurysm [facility])     Past Surgical History: Past Surgical History:  Procedure Laterality Date  . ABDOMINAL HYSTERECTOMY    . CHOLECYSTECTOMY N/A 01/28/2015   Procedure: LAPAROSCOPIC CHOLECYSTECTOMY;  Surgeon: Aviva Signs Md, MD;  Location: [facility];  Service: General;  Laterality: N/A;  . excision of bone spurs Bilateral    Big toes  . renal aneurysm Right    right coil-and only has function of left kidney  . X-STOP IMPLANTATION      Family History: Family History  Problem Relation Age of Onset  . Hypertension Mother   . Heart disease Mother        Cardiac Arrest   . Hypertension Father   . Hypertension Sister   . Cancer Brother        Bladder  Cancer   . Hypertension Brother     Social History: Social History   Tobacco Use  Smoking Status Former Smoker  . Packs/day: 0.25  . Years: 20.00  . Pack years: 5.00  . Types: Cigarettes  Smokeless Tobacco Never Used   Social History   Substance and Sexual Activity  Alcohol Use No   Social History   Substance and Sexual Activity  Drug Use No    Allergies: Allergies  Allergen Reactions  . Bee  Venom Hives  . Nsaids     Kidney disease   . Shellfish Allergy Itching    Medications: Current Outpatient Medications  Medication Sig Dispense Refill  . albuterol (PROVENTIL HFA;VENTOLIN HFA) 108 (90 Base) MCG/ACT inhaler Inhale into the lungs every 6 (six) hours as needed for wheezing or shortness of breath.    Marland Kitchen atenolol (TENORMIN) 50 MG tablet Take 1 tablet by mouth once daily 90 tablet 0  . Blood Glucose Monitoring Suppl (BLOOD GLUCOSE SYSTEM PAK) KIT Use as directed to monitor FSBS 3x weekly. Dx: E11.9. 1 kit 1  . budesonide-formoterol (SYMBICORT) 80-4.5 MCG/ACT inhaler Inhale 2 puffs into the lungs 2 (two) times daily. 1 Inhaler 3  . CARTIA XT 240 MG 24 hr capsule Take 1 capsule by mouth once daily 90 capsule 0  . clopidogrel (PLAVIX) 75 MG tablet Take 1 tablet (75 mg total) by mouth every evening. 90 tablet 3  . dexlansoprazole (DEXILANT) 60 MG capsule Take 1 capsule (60 mg total) by mouth daily. 30 capsule 3  . Diclofenac Sodium CR 100 MG 24 hr tablet Take 1 tablet (100 mg total) by mouth daily. 30 tablet 0  . Glucose Blood (BLOOD GLUCOSE TEST STRIPS) STRP Use as directed to monitor FSBS 3x weekly. Dx: E11.9. 50 strip 3  . HYDROcodone-acetaminophen (NORCO) 5-325 MG tablet Take 1 tablet by mouth every 6 (six) hours as needed for moderate pain. 20 tablet 0  . Lancets MISC Use as directed to monitor FSBS 3x weekly. Dx: E11.9. 50 each 3  . promethazine (PHENERGAN) 12.5 MG tablet Take 1 tablet (12.5 mg total) by mouth every 8 (eight) hours as needed for nausea or vomiting. 20 tablet 0  . triamcinolone cream (KENALOG) 0.1 % Apply 1 application topically 2 (two) times daily. To affected areas on arms and legs 30 g 0  . triamterene-hydrochlorothiazide (MAXZIDE-25) 37.5-25 MG tablet Take 1 tablet by mouth daily.     No current facility-administered medications for this visit.    Review of Systems: GENERAL: negative for malaise, night sweats HEENT: No changes in hearing or vision, no nose  bleeds or other nasal problems. NECK: Negative for lumps, goiter, pain and significant neck swelling RESPIRATORY: Negative for cough, wheezing CARDIOVASCULAR: Negative for chest pain, leg swelling, palpitations, orthopnea GI: SEE HPI MUSCULOSKELETAL: Negative for joint pain or swelling, back pain, and muscle pain. SKIN: Negative for lesions, rash PSYCH: Negative for sleep disturbance, mood disorder and recent psychosocial stressors. HEMATOLOGY Negative for prolonged bleeding, bruising easily, and swollen nodes. ENDOCRINE: Negative for cold or heat intolerance, polyuria, polydipsia and goiter. NEURO: negative for tremor, gait imbalance, syncope and seizures. The remainder of the review of systems is noncontributory.   Physical Exam: BP 119/79 (BP Location: Right Arm, Patient Position: Sitting, Cuff Size: Normal)   Pulse (!) 52   Temp 97.9 F (36.6 C) (Oral)   Ht _0  (1.676 m)   Wt 178 lb 14.4 oz (81.1 kg)   BMI 28.88 kg/m  GENERAL: The patient is  AO x3, in no acute distress. HEENT: Head is normocephalic and atraumatic. EOMI are intact. Mouth is well hydrated and without lesions. NECK: Supple. No masses LUNGS: Clear to auscultation. No presence of rhonchi/wheezing/rales. Adequate chest expansion HEART: RRR, normal s1 and s2. ABDOMEN: mildly tender upon palpation of the upper abdomen, no guarding, no peritoneal signs, and nondistended. BS +. No masses. EXTREMITIES: Without any cyanosis, clubbing, rash, lesions or edema. NEUROLOGIC: AOx3, no focal motor deficit. SKIN: no jaundice, no rashes   Imaging/Labs: as above  I personally reviewed and interpreted the available labs, imaging and endoscopic files.  Impression and Plan: Jaclyn Brooks is a 59 y.o. female with past medical history of asthma, hyperlipidemia, hypertension, depression, unilateral kidney, kidney artery aneurysm and gout, who presents for evaluation of constipation and diarrhea.  I consider that her episodes of  constipation was related to the intake of Norco that triggered the acute change in her bowel movements, however she presented persistent diarrhea after this episode resolved.  Infectious etiologies should be ruled out, for which we will obtain C. difficile, GI pathogen and ova and parasite testing now, but will obtain a CT of the abdomen without IV contrast given her nephropathy.  Also, is unclear why she is presenting with epigastric abdominal pain, for which we will proceed with an EGD.  Colonoscopy will help [place] determine if there is any active inflammation as well, but will also help [place] perform a screening test for colorectal cancer at the moment.  She may benefit from starting Zofran as needed for nausea as these may also help decreasing her bowel movement frequency, she will need to stop the Phenergan.  Patient understood and agreed.  - Schedule EGD and colonoscopy with SB and random colon biopsies - will obtain clearance from Dr. Servando Snare to stop Plavix 5 days prior - Schedule CT abdomen/pelvis without IV contrast - Perform stool test - Can take Zofran as needed for nausea, stop Phenergan  All questions were answered.      Jaclyn Peppers, MD Gastroenterology and Hepatology [facility]

## 2020-07-16 NOTE — Telephone Encounter (Signed)
Patient needs Plenvu (copay card) ° °

## 2020-07-17 ENCOUNTER — Ambulatory Visit (INDEPENDENT_AMBULATORY_CARE_PROVIDER_SITE_OTHER): Payer: Medicare Other | Admitting: Orthopedic Surgery

## 2020-07-17 ENCOUNTER — Ambulatory Visit: Payer: Self-pay

## 2020-07-17 ENCOUNTER — Ambulatory Visit (INDEPENDENT_AMBULATORY_CARE_PROVIDER_SITE_OTHER): Payer: Medicare Other | Admitting: Family Medicine

## 2020-07-17 ENCOUNTER — Encounter: Payer: Self-pay | Admitting: Orthopedic Surgery

## 2020-07-17 ENCOUNTER — Encounter: Payer: Self-pay | Admitting: Family Medicine

## 2020-07-17 VITALS — BP 128/74 | HR 66 | Temp 98.3°F | Resp 14 | Ht 66.0 in | Wt 178.0 lb

## 2020-07-17 DIAGNOSIS — R197 Diarrhea, unspecified: Secondary | ICD-10-CM

## 2020-07-17 DIAGNOSIS — M25561 Pain in right knee: Secondary | ICD-10-CM

## 2020-07-17 DIAGNOSIS — N179 Acute kidney failure, unspecified: Secondary | ICD-10-CM

## 2020-07-17 DIAGNOSIS — N1832 Chronic kidney disease, stage 3b: Secondary | ICD-10-CM | POA: Diagnosis not present

## 2020-07-17 DIAGNOSIS — M1711 Unilateral primary osteoarthritis, right knee: Secondary | ICD-10-CM

## 2020-07-17 LAB — BASIC METABOLIC PANEL
BUN/Creatinine Ratio: 18 (calc) (ref 6–22)
BUN: 50 mg/dL — ABNORMAL HIGH (ref 7–25)
CO2: 21 mmol/L (ref 20–32)
Calcium: 9.6 mg/dL (ref 8.6–10.4)
Chloride: 102 mmol/L (ref 98–110)
Creat: 2.82 mg/dL — ABNORMAL HIGH (ref 0.50–0.99)
Glucose, Bld: 103 mg/dL — ABNORMAL HIGH (ref 65–99)
Potassium: 4.1 mmol/L (ref 3.5–5.3)
Sodium: 136 mmol/L (ref 135–146)

## 2020-07-17 MED ORDER — BUPIVACAINE HCL 0.25 % IJ SOLN
4.0000 mL | INTRAMUSCULAR | Status: AC | PRN
  Administered 2020-07-17: 4 mL via INTRA_ARTICULAR

## 2020-07-17 MED ORDER — LIDOCAINE HCL 1 % IJ SOLN
5.0000 mL | INTRAMUSCULAR | Status: AC | PRN
  Administered 2020-07-17: 5 mL

## 2020-07-17 MED ORDER — METHYLPREDNISOLONE ACETATE 40 MG/ML IJ SUSP
40.0000 mg | INTRAMUSCULAR | Status: AC | PRN
  Administered 2020-07-17: 40 mg via INTRA_ARTICULAR

## 2020-07-17 NOTE — Progress Notes (Signed)
   Subjective:    Patient ID: Jaclyn Brooks, female    DOB: [birth year], 59 y.o.   MRN: [id number]  Patient presents for Follow-up   Pt here for IVF in setting of progressive acute on chronic renal failure. She has been having GI upset and diarrhea stools, also taking NSAIDS She has one functioning kidney with known CKD  Cr 5 days ago, spiked to 2.22 advised to stop her maxzide, no NSAIDS, Cr rechecked and now up to 2.82. She states her diarrhea has improved, she is drinking better. She had appt with GI yesterday , theyare planning to do EGD/COLONOSCOPY    Review Of Systems:  GEN- denies fatigue, fever, weight loss,weakness, recent illness HEENT- denies eye drainage, change in vision, nasal discharge, CVS- denies chest pain, palpitations RESP- denies SOB, cough, wheeze ABD- denies N/V, +change in stools, abd pain GU- denies dysuria, hematuria, dribbling, incontinence MSK- denies joint pain, muscle aches, injury Neuro- denies headache, dizziness, syncope, seizure activity       Objective:    BP 128/74   Pulse 66   Temp 98.3 F (36.8 C) (Temporal)   Resp 14   Ht 5\' 6"  (1.676 m)   Wt 178 lb (80.7 kg)   SpO2 99%   BMI 28.73 kg/m  GEN- NAD, alert and oriented x3 CVS- RRR, no murmur RESP-CTAB ABD-NABS,soft,NT,ND EXT- No edema Pulses- Radial, 2+   IVF NS  given      Assessment & Plan:      Problem List Items Addressed This Visit      Unprioritized   Diarrhea    Other Visit Diagnoses    Acute renal failure superimposed on stage 3b chronic kidney disease, unspecified acute renal failure type ([place])    -  Primary   1LNS given in office, to rehydrate due to  diarrheal episodes, stay off maxide, BP looks good, no NSAIDS, she was still taking some BC. Recheck renal function Monday Further GI work up pending Appt made with her nephrologist for next week as well    Relevant Orders   COMPLETE METABOLIC PANEL WITH GFR      Note: This dictation was prepared with Dragon  dictation along with smaller phrase technology. Any transcriptional errors that result from this process are unintentional.

## 2020-07-17 NOTE — Patient Instructions (Addendum)
[place], [place] 07/22/2020 @ 12:45pm F/U Monday for repeat lab visit   Do not take Maxide (Blood pressure pill) Diclofenac ( arthritis pill) Do not take any BC powder, Ibuprofen, aleve, advil, Motrin   You can take tylenol for pain

## 2020-07-17 NOTE — Progress Notes (Signed)
Office Visit Note   Patient: Jaclyn Brooks           Date of Birth: 13-Jun-1961           MRN: [id number] Visit Date: 07/17/2020 Requested by: Alycia Rossetti, MD [address] [place],   [address] PCP: Alycia Rossetti, MD  Subjective: Chief Complaint  Patient presents with  . Right Knee - Pain    HPI: Jaclyn Brooks is a 59 year old patient with right knee pain.  Has had pain for years.  Reports weakness giving way locking popping and stiffness and occasional waking from pain.  Cannot really take nonsteroidals because she has 1 kidney.  The pain comes and goes but is not every day.  Occasionally will radiate down to the foot but she denies any back pain.  Does report episodic "sciatica".  Has a lot of morning pain and stiffness in the knee.              ROS: All systems reviewed are negative as they relate to the chief complaint within the history of present illness.  Patient denies  fevers or chills.   Assessment & Plan: Visit Diagnoses:  1. Right knee pain, unspecified chronicity     Plan: Impression is right knee arthritis.  Plan is right knee injection.  We will see how that goes for her in terms of pain relief.  Continue with nonweightbearing quad strengthening exercises and follow-up as needed.  Follow-Up Instructions: No follow-ups on file.   Orders:  Orders Placed This Encounter  Procedures  . XR KNEE 3 VIEW RIGHT   No orders of the defined types were placed in this encounter.     Procedures: Large Joint Inj: R knee on 07/17/2020 1:07 PM Indications: diagnostic evaluation, joint swelling and pain Details: 18 G 1.5 in needle, superolateral approach  Arthrogram: No  Medications: 5 mL lidocaine 1 %; 40 mg methylPREDNISolone acetate 40 MG/ML; 4 mL bupivacaine 0.25 % Outcome: tolerated well, no immediate complications Procedure, treatment alternatives, risks and benefits explained, specific risks discussed. Consent was given by the patient. Immediately prior to  procedure a time out was called to verify the correct patient, procedure, equipment, support staff and site/side marked as required. Patient was prepped and draped in the usual sterile fashion.       Clinical Data: No additional findings.  Objective: Vital Signs: There were no vitals taken for this visit.  Physical Exam:   Constitutional: Patient appears well-developed HEENT:  Head: Normocephalic Eyes:EOM are normal Neck: Normal range of motion Cardiovascular: Normal rate Pulmonary/chest: Effort normal Neurologic: Patient is alert Skin: Skin is warm Psychiatric: Patient has normal mood and affect    Ortho Exam: Ortho exam demonstrates normal gait alignment.  Palpable pedal pulses bilaterally.  No groin pain with internal X rotation of the leg.  No masses lymphadenopathy or skin changes noted in that right knee region.  Collateral crucial ligaments are stable.  Extensor mechanism is intact.  No popliteal cyst palpable.  Specialty Comments:  No specialty comments available.  Imaging: No results found.   PMFS History: Patient Active Problem List   Diagnosis Date Noted  . Abdominal pain 07/16/2020  . Diarrhea 07/16/2020  . Nausea without vomiting 07/16/2020  . Diabetes mellitus without complication ([place]) 03/00/9233  . 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 GERD (gastroesophageal reflux disease) 05/31/2019  . Solitary kidney, acquired 05/10/2019  . Carotid arterial disease ([place]) 12/11/2018  . COPD, mild ([place]) 10/01/2018  .  CKD (chronic kidney disease) stage 3, GFR 30-59 ml/min ([facility]) 10/01/2018  . Aortic valve sclerosis 10/01/2018  . Hypertension 09/11/2018   Past Medical History:  Diagnosis Date  . Allergy   . Depression   . Gout   . Hyperlipidemia   . Hypertension   . 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 Hypertension Mother   . Heart disease Mother        Cardiac Arrest   . Hypertension Father   .  Hypertension Sister   . Cancer Brother        Bladder Cancer   . Hypertension Brother     Past Surgical History:  Procedure Laterality Date  . ABDOMINAL HYSTERECTOMY    . CHOLECYSTECTOMY N/A 01/28/2015   Procedure: LAPAROSCOPIC CHOLECYSTECTOMY;  Surgeon: Aviva Signs Md, MD;  Location: [facility];  Service: General;  Laterality: N/A;  . excision of bone spurs Bilateral    Big toes  . renal aneurysm Right    right coil-and only has function of left kidney  . X-STOP IMPLANTATION     Social History   Occupational History  . Occupation: Retired  Tobacco Use  . Smoking status: Former Smoker    Packs/day: 0.25    Years: 20.00    Pack years: 5.00    Types: Cigarettes  . Smokeless tobacco: Never Used  Vaping Use  . Vaping Use: Never used  Substance and Sexual Activity  . Alcohol use: No  . Drug use: No  . Sexual activity: Yes    Birth control/protection: Surgical

## 2020-07-19 ENCOUNTER — Encounter: Payer: Self-pay | Admitting: Family Medicine

## 2020-07-20 ENCOUNTER — Other Ambulatory Visit: Payer: Medicare Other

## 2020-07-20 ENCOUNTER — Other Ambulatory Visit: Payer: Self-pay

## 2020-07-20 ENCOUNTER — Telehealth: Payer: Self-pay

## 2020-07-20 DIAGNOSIS — N179 Acute kidney failure, unspecified: Secondary | ICD-10-CM | POA: Diagnosis not present

## 2020-07-20 DIAGNOSIS — N1832 Chronic kidney disease, stage 3b: Secondary | ICD-10-CM

## 2020-07-20 DIAGNOSIS — R300 Dysuria: Secondary | ICD-10-CM | POA: Diagnosis not present

## 2020-07-20 LAB — COMPLETE METABOLIC PANEL WITH GFR
AG Ratio: 1.2 (calc) (ref 1.0–2.5)
ALT: 21 U/L (ref 6–29)
AST: 16 U/L (ref 10–35)
Albumin: 3.7 g/dL (ref 3.6–5.1)
Alkaline phosphatase (APISO): 54 U/L (ref 37–153)
BUN/Creatinine Ratio: 19 (calc) (ref 6–22)
BUN: 29 mg/dL — ABNORMAL HIGH (ref 7–25)
CO2: 28 mmol/L (ref 20–32)
Calcium: 9.5 mg/dL (ref 8.6–10.4)
Chloride: 108 mmol/L (ref 98–110)
Creat: 1.49 mg/dL — ABNORMAL HIGH (ref 0.50–0.99)
GFR, Est African American: 43 mL/min/{1.73_m2} — ABNORMAL LOW (ref >=60)
GFR, Est Non African American: 38 mL/min/{1.73_m2} — ABNORMAL LOW (ref >=60)
Globulin: 3 g/dL (calc) (ref 1.9–3.7)
Glucose, Bld: 125 mg/dL — ABNORMAL HIGH (ref 65–99)
Potassium: 4.3 mmol/L (ref 3.5–5.3)
Sodium: 142 mmol/L (ref 135–146)
Total Bilirubin: 0.4 mg/dL (ref 0.2–1.2)
Total Protein: 6.7 g/dL (ref 6.1–8.1)

## 2020-07-20 NOTE — Telephone Encounter (Signed)
Patient reports pressure and flank pain.   Advised to leave UA. Orders placed.

## 2020-07-20 NOTE — Telephone Encounter (Signed)
Pt is in the Lobby she come in this morning to do labs and is not feeling good at all she said she thinks she may have a possible UTI and she was going to wait out here until Dr Buelah Manis lets her know what she needs to do.

## 2020-07-20 NOTE — Telephone Encounter (Signed)
Okay to leave UA

## 2020-07-21 LAB — URINE CULTURE
MICRO NUMBER:: 11055692
Result:: NO GROWTH
SPECIMEN QUALITY:: ADEQUATE

## 2020-07-21 LAB — URINALYSIS, ROUTINE W REFLEX MICROSCOPIC
Bacteria, UA: NONE SEEN /HPF
Bilirubin Urine: NEGATIVE
Glucose, UA: NEGATIVE
Hgb urine dipstick: NEGATIVE
Hyaline Cast: NONE SEEN /LPF
Ketones, ur: NEGATIVE
Leukocytes,Ua: NEGATIVE
Nitrite: NEGATIVE
RBC / HPF: NONE SEEN /HPF (ref 0–2)
Specific Gravity, Urine: 1.024 (ref 1.001–1.03)
WBC, UA: NONE SEEN /HPF (ref 0–5)
pH: 5.5 (ref 5.0–8.0)

## 2020-07-22 DIAGNOSIS — D631 Anemia in chronic kidney disease: Secondary | ICD-10-CM | POA: Diagnosis not present

## 2020-07-22 DIAGNOSIS — N183 Chronic kidney disease, stage 3 unspecified: Secondary | ICD-10-CM | POA: Diagnosis not present

## 2020-07-22 DIAGNOSIS — I129 Hypertensive chronic kidney disease with stage 1 through stage 4 chronic kidney disease, or unspecified chronic kidney disease: Secondary | ICD-10-CM | POA: Diagnosis not present

## 2020-07-24 ENCOUNTER — Telehealth: Payer: Self-pay | Admitting: Family Medicine

## 2020-07-24 MED ORDER — HYDROCHLOROTHIAZIDE 25 MG PO TABS: 25.0000 mg | ORAL_TABLET | Freq: Every day | ORAL | 0 refills | Status: DC

## 2020-07-24 NOTE — Telephone Encounter (Signed)
Patient called and having issues with her feet swelling. Would like a call back from nurse

## 2020-07-24 NOTE — Telephone Encounter (Signed)
Call placed to patient and patient made aware.   Prescription called to pharmacy.

## 2020-07-24 NOTE — Telephone Encounter (Signed)
Send HCTZ 25mg  once a day   - 30 tabs, keep hydrated with this

## 2020-07-24 NOTE — Telephone Encounter (Signed)
Call placed to patient to inquire.   Reports increased edema in B feet/ ankles. States that swelling is painful in B feet as well.   Patient stopped fluid pill D/T decreased kidney function.   MD please advise.

## 2020-07-27 ENCOUNTER — Ambulatory Visit (HOSPITAL_COMMUNITY): Admission: RE | Admit: 2020-07-27 | Payer: Medicare Other | Source: Ambulatory Visit

## 2020-07-31 ENCOUNTER — Ambulatory Visit (HOSPITAL_COMMUNITY): Admission: RE | Admit: 2020-07-31 | Payer: Medicare Other | Source: Ambulatory Visit

## 2020-08-04 ENCOUNTER — Telehealth: Payer: Self-pay | Admitting: Orthopedic Surgery

## 2020-08-04 NOTE — Telephone Encounter (Signed)
Please advise. Thanks.  

## 2020-08-04 NOTE — Telephone Encounter (Signed)
Pt called asking if Dr.Dean would call her in a rx for the pain in her foot that won't interfere with her kidneys; pt would like a CB to discuss what options she may have  [phone]

## 2020-08-05 ENCOUNTER — Ambulatory Visit (INDEPENDENT_AMBULATORY_CARE_PROVIDER_SITE_OTHER): Payer: Medicare Other | Admitting: Orthopedic Surgery

## 2020-08-05 ENCOUNTER — Ambulatory Visit: Payer: Self-pay

## 2020-08-05 ENCOUNTER — Other Ambulatory Visit: Payer: Self-pay

## 2020-08-05 DIAGNOSIS — M25571 Pain in right ankle and joints of right foot: Secondary | ICD-10-CM | POA: Diagnosis not present

## 2020-08-05 DIAGNOSIS — M19072 Primary osteoarthritis, left ankle and foot: Secondary | ICD-10-CM | POA: Diagnosis not present

## 2020-08-05 NOTE — Telephone Encounter (Signed)
Discussed at appointment today, will see her back on Friday for injection

## 2020-08-06 ENCOUNTER — Encounter (HOSPITAL_COMMUNITY): Payer: Self-pay | Admitting: Gastroenterology

## 2020-08-06 ENCOUNTER — Other Ambulatory Visit (INDEPENDENT_AMBULATORY_CARE_PROVIDER_SITE_OTHER): Payer: Self-pay

## 2020-08-07 ENCOUNTER — Ambulatory Visit (INDEPENDENT_AMBULATORY_CARE_PROVIDER_SITE_OTHER): Payer: Medicare Other | Admitting: Orthopedic Surgery

## 2020-08-07 ENCOUNTER — Encounter: Payer: Self-pay | Admitting: Orthopedic Surgery

## 2020-08-07 ENCOUNTER — Other Ambulatory Visit: Payer: Self-pay

## 2020-08-07 DIAGNOSIS — M19072 Primary osteoarthritis, left ankle and foot: Secondary | ICD-10-CM | POA: Diagnosis not present

## 2020-08-07 MED ORDER — LIDOCAINE HCL 1 % IJ SOLN
3.0000 mL | INTRAMUSCULAR | Status: AC | PRN
  Administered 2020-08-07: 3 mL

## 2020-08-07 MED ORDER — BUPIVACAINE HCL 0.5 % IJ SOLN
1.0000 mL | INTRAMUSCULAR | Status: AC | PRN
  Administered 2020-08-07: 1 mL via INTRA_ARTICULAR

## 2020-08-07 MED ORDER — METHYLPREDNISOLONE ACETATE 40 MG/ML IJ SUSP
40.0000 mg | INTRAMUSCULAR | Status: AC | PRN
  Administered 2020-08-07: 40 mg via INTRA_ARTICULAR

## 2020-08-07 NOTE — Progress Notes (Signed)
Office Visit Note   Patient: Jaclyn Brooks           Date of Birth: 1961/09/15           MRN: [id number] Visit Date: 08/07/2020 Requested by: Alycia Rossetti, MD [address] [place],  [place] [address] PCP: Alycia Rossetti, MD  Subjective: Chief Complaint  Patient presents with  . Left Ankle - Pain    HPI: Jaclyn Brooks is a 59 year old patient with left ankle arthritis.  Had an injection in July and she did well with that.  Has 1 kidney and would like to know if she can take anything outside of Tylenol for pain that is not narcotic and realistically the answer is no.  She does report some recurrent pain with ambulation which is just started within the last month.  Denies any mechanical symptoms in the ankle.             ROS: All systems reviewed are negative as they relate to the chief complaint within the history of present illness.  Patient denies  fevers or chills.   Assessment & Plan: Visit Diagnoses:  1. Arthritis of left ankle     Plan: Impression is left ankle arthritis.  Plan is ultrasound-guided injection into the left ankle joint today.  We will see how she does with that.  She may be a candidate for ankle replacement in the future.  She wants to hold off on that intervention.  All in all she is doing pretty well with the injections which we could do 2 to a maximum of 3 times a year.  Follow-up as needed  Follow-Up Instructions: No follow-ups on file.   Orders:  No orders of the defined types were placed in this encounter.  No orders of the defined types were placed in this encounter.     Procedures: Medium Joint Inj: L ankle on 08/07/2020 8:58 AM Indications: pain, joint swelling and diagnostic evaluation Details: 22 G 1.5 in needle, ultrasound-guided anteromedial approach Medications: 3 mL lidocaine 1 %; 1 mL bupivacaine 0.5 %; 40 mg methylPREDNISolone acetate 40 MG/ML (1/2 mL bupivacaine 0.5 %) Outcome: tolerated well, no immediate complications Procedure,  treatment alternatives, risks and benefits explained, specific risks discussed. Consent was given by the patient. Immediately prior to procedure a time out was called to verify the correct patient, procedure, equipment, support staff and site/side marked as required. Patient was prepped and draped in the usual sterile fashion.       Clinical Data: No additional findings.  Objective: Vital Signs: There were no vitals taken for this visit.  Physical Exam:   Constitutional: Patient appears well-developed HEENT:  Head: Normocephalic Eyes:EOM are normal Neck: Normal range of motion Cardiovascular: Normal rate Pulmonary/chest: Effort normal Neurologic: Patient is alert Skin: Skin is warm Psychiatric: Patient has normal mood and affect    Ortho Exam: Ortho exam demonstrates diffuse tenderness around the ankle.  Pedal pulses intact.  Ankle dorsiflexion plantarflexion inversion eversion strength is intact with mild tenderness on all tendons.  She does have slight limitation of ankle dorsiflexion plantarflexion consistent with her diagnosis of arthritis.  Specialty Comments:  No specialty comments available.  Imaging: No results found.   PMFS History: Patient Active Problem List   Diagnosis Date Noted  . Abdominal pain 07/16/2020  . Diarrhea 07/16/2020  . Nausea without vomiting 07/16/2020  . Diabetes mellitus without complication ([place]) 78/46/9629  . 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 GERD (gastroesophageal  reflux disease) 05/31/2019  . Solitary kidney, acquired 05/10/2019  . Carotid arterial disease ([place]) 12/11/2018  . COPD, mild ([place]) 10/01/2018  . CKD (chronic kidney disease) stage 3, GFR 30-59 ml/min ([facility]) 10/01/2018  . Aortic valve sclerosis 10/01/2018  . Hypertension 09/11/2018   Past Medical History:  Diagnosis Date  . Allergy   . Depression   . Gout   . Hyperlipidemia   . Hypertension   . 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[facility])     Family History    Problem Relation Age of Onset  . Hypertension Mother   . Heart disease Mother        Cardiac Arrest   . Hypertension Father   . Hypertension Sister   . Cancer Brother        Bladder Cancer   . Hypertension Brother     Past Surgical History:  Procedure Laterality Date  . ABDOMINAL HYSTERECTOMY    . CHOLECYSTECTOMY N/A 01/28/2015   Procedure: LAPAROSCOPIC CHOLECYSTECTOMY;  Surgeon: Aviva Signs Md, MD;  Location: [facility];  Service: General;  Laterality: N/A;  . excision of bone spurs Bilateral    Big toes  . renal aneurysm Right    right coil-and only has function of left kidney  . X-STOP IMPLANTATION     Social History   Occupational History  . Occupation: Retired  Tobacco Use  . Smoking status: Former Smoker    Packs/day: 0.25    Years: 20.00    Pack years: 5.00    Types: Cigarettes  . Smokeless tobacco: Never Used  Vaping Use  . Vaping Use: Never used  Substance and Sexual Activity  . Alcohol use: No  . Drug use: No  . Sexual activity: Yes    Birth control/protection: Surgical

## 2020-08-08 ENCOUNTER — Encounter: Payer: Self-pay | Admitting: Orthopedic Surgery

## 2020-08-08 NOTE — Progress Notes (Signed)
Office Visit Note   Patient: Jaclyn Brooks           Date of Birth: [birth year]           MRN: [id number] Visit Date: 08/05/2020 Requested by: Alycia Rossetti, MD [address] [place],  [place] [address] PCP: Alycia Rossetti, MD  Subjective: Chief Complaint  Patient presents with  . Right Foot - Pain  . Left Foot - Pain    HPI: Jaclyn Brooks is a 59 y.o. female who presents to the office complaining of bilateral foot pain.  Patient's main complaint is that her left ankle pain has returned.  She has a history of left ankle arthritis that was diagnosed on CT scan earlier this year.  She has kidney dysfunction and her main goal is to find a medication that she can take that will not impact her kidney function and help with her foot pain.  She is unable to take the diclofenac that she has due to her kidney dysfunction.  She notes that pain comes and goes in intensity and location but generally it affects her left ankle and her right great toe..                ROS: All systems reviewed are negative as they relate to the chief complaint within the history of present illness.  Patient denies fevers or chills.  Assessment & Plan: Visit Diagnoses:  1. Pain in right ankle and joints of right foot   2. Arthritis of left ankle     Plan: Patient is a 59 year old female presents complaining of left ankle pain and right foot pain.  Most of the left ankle pain she experiences is along the ankle joint line.  She has history of left ankle arthritis that was diagnosed on CT scan earlier this year.  She had good relief with left ankle injection that she received in June and provided several months of relief.  Recommended she try another left ankle intra-articular injection as this will not impact her kidney function significantly.  Patient states that she is not mentally prepared for an injection today and wishes to come back on Friday.  Agreed with plan.  Additionally, radiographs taken today of the  right foot reveal severe end-stage arthritis of the first MTP joint with sclerosis, joint space narrowing, osteophyte formation.  This toe pain is not greatly impacting her quality of life at this time enough to warrant surgical management.  Discussed medications that she can take in order to help with her pain.  She does not want to take any narcotic medication.  She has tried Tylenol without any significant relief.  Recommended she try topical Voltaren which she can apply 3-4 times per day.  She will obtain this today and try using this over the next several weeks.  Return on Friday for left ankle injection with Dr. Marlou Sa.  Follow-Up Instructions: No follow-ups on file.   Orders:  Orders Placed This Encounter  Procedures  . XR Foot Complete Right   No orders of the defined types were placed in this encounter.     Procedures: Medium Joint Inj: L ankle on 08/09/2020 2:09 PM Indications: pain, joint swelling and diagnostic evaluation Details: 22 G 1.5 in needle, ultrasound-guided anterolateral approach Medications: 3 mL lidocaine 1 %; 1 mL bupivacaine 0.5 %; 40 mg methylPREDNISolone acetate 40 MG/ML (1/2 mL bupivacaine 0.5 %) Outcome: tolerated well, no immediate complications Procedure, treatment alternatives, risks and benefits  explained, specific risks discussed. Consent was given by the patient. Immediately prior to procedure a time out was called to verify the correct patient, procedure, equipment, support staff and site/side marked as required. Patient was prepped and draped in the usual sterile fashion.       Clinical Data: No additional findings.  Objective: Vital Signs: There were no vitals taken for this visit.  Physical Exam:  Constitutional: Patient appears well-developed HEENT:  Head: Normocephalic Eyes:EOM are normal Neck: Normal range of motion Cardiovascular: Normal rate Pulmonary/chest: Effort normal Neurologic: Patient is alert Skin: Skin is warm Psychiatric:  Patient has normal mood and affect  Ortho Exam: Ortho exam demonstrates left ankle with tenderness over the ankle joint line.  No tenderness over the medial or lateral malleoli.  Mild tenderness over the ATFL.  Active dorsiflexion/plantarflexion/inversion/eversion are intact.  No pes planus bilaterally.  No significant swelling noted.  Right foot with tenderness over the first MTP joint and reduced range of motion of the first MTP joint.    Specialty Comments:  No specialty comments available.  Imaging: No results found.   PMFS History: Patient Active Problem List   Diagnosis Date Noted  . Abdominal pain 07/16/2020  . Diarrhea 07/16/2020  . Nausea without vomiting 07/16/2020  . Diabetes mellitus without complication ([facility]) 97/98/9211  . 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 GERD (gastroesophageal reflux disease) 05/31/2019  . Solitary kidney, acquired 05/10/2019  . Carotid arterial disease ([facility]) 12/11/2018  . COPD, mild ([facility]) 10/01/2018  . CKD (chronic kidney disease) stage 3, GFR 30-59 ml/min ([facility]) 10/01/2018  . Aortic valve sclerosis 10/01/2018  . Hypertension 09/11/2018   Past Medical History:  Diagnosis Date  . Allergy   . Depression   . Gout   . Hyperlipidemia   . Hypertension   . 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 Hypertension Mother   . Heart disease Mother        Cardiac Arrest   . Hypertension Father   . Hypertension Sister   . Cancer Brother        Bladder Cancer   . Hypertension Brother     Past Surgical History:  Procedure Laterality Date  . ABDOMINAL HYSTERECTOMY    . CHOLECYSTECTOMY N/A 01/28/2015   Procedure: LAPAROSCOPIC CHOLECYSTECTOMY;  Surgeon: Aviva Signs Md, MD;  Location: [facility];  Service: General;  Laterality: N/A;  . excision of bone spurs Bilateral    Big toes  . renal aneurysm Right    right coil-and only has function of left kidney  . X-STOP IMPLANTATION     Social History     Occupational History  . Occupation: Retired  Tobacco Use  . Smoking status: Former Smoker    Packs/day: 0.25    Years: 20.00    Pack years: 5.00    Types: Cigarettes  . Smokeless tobacco: Never Used  Vaping Use  . Vaping Use: Never used  Substance and Sexual Activity  . Alcohol use: No  . Drug use: No  . Sexual activity: Yes    Birth control/protection: Surgical

## 2020-08-09 DIAGNOSIS — M19072 Primary osteoarthritis, left ankle and foot: Secondary | ICD-10-CM

## 2020-08-09 MED ORDER — BUPIVACAINE HCL 0.5 % IJ SOLN
1.0000 mL | INTRAMUSCULAR | Status: AC | PRN
  Administered 2020-08-09: 1 mL via INTRA_ARTICULAR

## 2020-08-09 MED ORDER — LIDOCAINE HCL 1 % IJ SOLN
3.0000 mL | INTRAMUSCULAR | Status: AC | PRN
  Administered 2020-08-09: 3 mL

## 2020-08-09 MED ORDER — METHYLPREDNISOLONE ACETATE 40 MG/ML IJ SUSP
40.0000 mg | INTRAMUSCULAR | Status: AC | PRN
  Administered 2020-08-09: 40 mg via INTRA_ARTICULAR

## 2020-08-09 NOTE — Progress Notes (Signed)
Hi Lauren this is the second 1 of these that I sign off on for maybe for the same day.  I looked in her note and there was only 1 injection but it came up as having to be signed off his twice.  Can you make sure that there is no billing for 2 injections going on here as opposed to just the one.  Thanks

## 2020-08-10 ENCOUNTER — Encounter (INDEPENDENT_AMBULATORY_CARE_PROVIDER_SITE_OTHER): Payer: Self-pay | Admitting: *Deleted

## 2020-08-10 ENCOUNTER — Other Ambulatory Visit (INDEPENDENT_AMBULATORY_CARE_PROVIDER_SITE_OTHER): Payer: Self-pay | Admitting: *Deleted

## 2020-08-10 ENCOUNTER — Other Ambulatory Visit: Payer: Self-pay

## 2020-08-10 ENCOUNTER — Other Ambulatory Visit (HOSPITAL_COMMUNITY)
Admission: RE | Admit: 2020-08-10 | Discharge: 2020-08-10 | Disposition: A | Discharge status: Home / Self Care | Payer: Medicare Other | Source: Ambulatory Visit | Attending: Gastroenterology | Admitting: Gastroenterology

## 2020-08-10 DIAGNOSIS — Z20822 Contact with and (suspected) exposure to covid-19: Secondary | ICD-10-CM | POA: Diagnosis not present

## 2020-08-10 DIAGNOSIS — Z01818 Encounter for other preprocedural examination: Secondary | ICD-10-CM | POA: Diagnosis not present

## 2020-08-10 LAB — SARS CORONAVIRUS 2 (TAT 6-24 HRS): SARS Coronavirus 2: NEGATIVE

## 2020-08-10 NOTE — Progress Notes (Signed)
thx

## 2020-08-23 ENCOUNTER — Other Ambulatory Visit: Payer: Self-pay | Admitting: Family Medicine

## 2020-08-28 ENCOUNTER — Encounter: Payer: Self-pay | Admitting: Family Medicine

## 2020-08-28 ENCOUNTER — Ambulatory Visit (INDEPENDENT_AMBULATORY_CARE_PROVIDER_SITE_OTHER): Payer: Medicare Other | Admitting: Family Medicine

## 2020-08-28 ENCOUNTER — Other Ambulatory Visit: Payer: Self-pay

## 2020-08-28 VITALS — BP 126/80 | HR 72 | Temp 98.1°F | Resp 14 | Ht 66.0 in | Wt 181.0 lb

## 2020-08-28 DIAGNOSIS — I10 Essential (primary) hypertension: Secondary | ICD-10-CM

## 2020-08-28 DIAGNOSIS — J449 Chronic obstructive pulmonary disease, unspecified: Secondary | ICD-10-CM

## 2020-08-28 DIAGNOSIS — N1832 Chronic kidney disease, stage 3b: Secondary | ICD-10-CM | POA: Diagnosis not present

## 2020-08-28 DIAGNOSIS — Z905 Acquired absence of kidney: Secondary | ICD-10-CM

## 2020-08-28 DIAGNOSIS — Z0001 Encounter for general adult medical examination with abnormal findings: Secondary | ICD-10-CM | POA: Diagnosis not present

## 2020-08-28 DIAGNOSIS — Z0000 Encounter for general adult medical examination without abnormal findings: Secondary | ICD-10-CM

## 2020-08-28 DIAGNOSIS — E119 Type 2 diabetes mellitus without complications: Secondary | ICD-10-CM

## 2020-08-28 MED ORDER — BUDESONIDE-FORMOTEROL FUMARATE 80-4.5 MCG/ACT IN AERO
2.0000 | INHALATION_SPRAY | Freq: Two times a day (BID) | RESPIRATORY_TRACT | 2 refills | Status: DC
Start: 2020-08-28 — End: 2021-10-21

## 2020-08-28 MED ORDER — ALBUTEROL SULFATE HFA 108 (90 BASE) MCG/ACT IN AERS
1.0000 | INHALATION_SPRAY | Freq: Four times a day (QID) | RESPIRATORY_TRACT | 2 refills | Status: DC | PRN
Start: 2020-08-28 — End: 2020-11-26

## 2020-08-28 NOTE — Progress Notes (Signed)
Subjective:   Patient presents for Medicare Annual/Subsequent preventive examination.     Pt here for wellness visit   She was recently seen due to acute on crhonic renal failure  In setting of NSAIDS, Diarrhea   she is followed by GI, scheduled for colonoscopy next month  bowel movements back on track    She was seen by [facility], since our last visit , states her levels were good     DM- last A1C 6.7% in Sept, she has not checked since then   She is not on any medications   This AM had bacon/sausage / Orange Juice   Review Past Medical/Family/Social: Per EMR    Risk Factors  Current exercise habits: None Dietary issues discussed: Yes  Cardiac risk factors: HTN, carotid artery disease, kidney disease  Depression Screen  (Note: if answer to either of the following is "Yes", a more complete depression screening is indicated)  Over the past two weeks, have you felt down, depressed or hopeless? No Over the past two weeks, have you felt little interest or pleasure in doing things? No Have you lost interest or pleasure in daily life? No Do you often feel hopeless? No Do you cry easily over simple problems? No   Activities of Daily Living  In your present state of health, do you have any difficulty performing the following activities?:  Driving? No  Managing money? No  Feeding yourself? No  Getting from bed to chair? No  Climbing a flight of stairs? No  Preparing food and eating?: No  Bathing or showering? No  Getting dressed: No  Getting to the toilet? No  Using the toilet:No  Moving around from place to place: No  In the past year have you fallen or had a near fall?:No    Hearing Difficulties: No  Do you often ask people to speak up or repeat themselves? No  Do you experience ringing or noises in your ears? No Do you have difficulty understanding soft or whispered voices? No  Do you feel that you have a problem with memory? No Do you often misplace  items? No  Do you feel safe at home? Yes  Cognitive Testing  Alert? Yes Normal Appearance?Yes  Oriented to person? Yes Place? Yes  Time? Yes  Recall of three objects? Yes  Can perform simple calculations? Yes  Displays appropriate judgment?Yes  Can read the correct time from a watch face?Yes   List the Names of Other Physician/Practitioners you currently use:   Nephrology - Dr. [facility]   Orthopedics - Dr. Marlou Sa      Screening Tests / Date Colonoscopy   she is scheduled for colonoscopy 09/15/20                   Zostavax declines Hep C screening- Due  S/P hystrectomy  Mammogram - DUE  Influenza Vaccine DeClines Tetanus/tdap declines COVID 19 UTD  ROS: GEN- denies fatigue, fever, weight loss,weakness, recent illness HEENT- denies eye drainage, change in vision, nasal discharge, CVS- denies chest pain, palpitations RESP- denies SOB, cough, wheeze ABD- denies N/V, change in stools, abd pain GU- denies dysuria, hematuria, dribbling, incontinence MSK- denies joint pain, muscle aches, injury Neuro- denies headache, dizziness, syncope, seizure activity  GEN- NAD, alert and oriented x3 HEENT- PERRL, EOMI, non injected sclera, pink conjunctiva, MMM, oropharynx clear Neck- Supple, no thryomegaly, + bruit CVS- RRR, no murmur RESP-CTAB EXT- No edema Pulses- Radial, DP- 2+  Diabetic Foot- normal monofilament, minimal callus  Assessment:    Annual wellness medicare exam   Plan:    During the course of the visit the patient was educated and counseled about appropriate screening and preventive services including:  Screening mammography patient to schedule Colorectal cancer screening scheduled  Patient declines immunizations . Carotid artery disease.  Recommend she take the statin drug she declines at this time.  I am checking her lipid panel in 1 month .  DM type 2- advised to check CBG a few times a week to keep her accountable, recheck A1C in 1 month    COPD - uses symbicort   Full code  Return for fasting labs    Diet review for nutrition referral? Yes ____ Not Indicated __x__  Patient Instructions (the written plan) was given to the patient.  Medicare Attestation  I have personally reviewed:  The patient's medical and social history  Their use of alcohol, tobacco or illicit drugs  Their current medications and supplements  The patient's functional ability including ADLs,fall risks, home safety risks, cognitive, and hearing and visual impairment  Diet and physical activities  Evidence for depression or mood disorders  The patient's weight, height, BMI, and visual acuity have been recorded in the chart. I have made referrals, counseling, and provided education to the patient based on review of the above and I have provided the patient with a written personalized care plan for preventive services.

## 2020-08-28 NOTE — Patient Instructions (Addendum)
Schedule your Mammogram  RETURN for fasting labs in 1 month

## 2020-09-10 ENCOUNTER — Ambulatory Visit (INDEPENDENT_AMBULATORY_CARE_PROVIDER_SITE_OTHER): Payer: Medicare Other | Admitting: Gastroenterology

## 2020-09-10 ENCOUNTER — Telehealth (INDEPENDENT_AMBULATORY_CARE_PROVIDER_SITE_OTHER): Payer: Self-pay

## 2020-09-10 NOTE — Telephone Encounter (Signed)
Noted  

## 2020-09-10 NOTE — Telephone Encounter (Signed)
Patient no showed for her appt with Dr. Jenetta Downer on 09/10/2020.

## 2020-09-14 ENCOUNTER — Other Ambulatory Visit (HOSPITAL_COMMUNITY)
Admission: RE | Admit: 2020-09-14 | Discharge: 2020-09-14 | Disposition: A | Discharge status: Home / Self Care | Payer: Medicare Other | Source: Ambulatory Visit | Attending: Gastroenterology | Admitting: Gastroenterology

## 2020-09-14 NOTE — OR Nursing (Signed)
Called patient to go over instructions for procedure. Patient stated she got the Covid booster yesterday and wants to reschedule her procedure. Jaclyn Brooks notified at Dr. Colman Cater office.

## 2020-09-15 ENCOUNTER — Ambulatory Visit (HOSPITAL_COMMUNITY): Admission: RE | Admit: 2020-09-15 | Payer: Medicare Other | Source: Home / Self Care | Admitting: Gastroenterology

## 2020-09-15 ENCOUNTER — Encounter (HOSPITAL_COMMUNITY): Admission: RE | Payer: Self-pay | Source: Home / Self Care

## 2020-09-15 SURGERY — COLONOSCOPY WITH PROPOFOL
Anesthesia: Monitor Anesthesia Care

## 2020-09-18 ENCOUNTER — Other Ambulatory Visit: Payer: Medicare Other

## 2020-09-18 ENCOUNTER — Other Ambulatory Visit: Payer: Self-pay

## 2020-09-18 DIAGNOSIS — N183 Chronic kidney disease, stage 3 unspecified: Secondary | ICD-10-CM | POA: Diagnosis not present

## 2020-09-18 DIAGNOSIS — N2581 Secondary hyperparathyroidism of renal origin: Secondary | ICD-10-CM | POA: Diagnosis not present

## 2020-09-18 DIAGNOSIS — N189 Chronic kidney disease, unspecified: Secondary | ICD-10-CM | POA: Diagnosis not present

## 2020-09-18 DIAGNOSIS — I129 Hypertensive chronic kidney disease with stage 1 through stage 4 chronic kidney disease, or unspecified chronic kidney disease: Secondary | ICD-10-CM | POA: Diagnosis not present

## 2020-09-18 DIAGNOSIS — D631 Anemia in chronic kidney disease: Secondary | ICD-10-CM | POA: Diagnosis not present

## 2020-09-18 DIAGNOSIS — N179 Acute kidney failure, unspecified: Secondary | ICD-10-CM | POA: Diagnosis not present

## 2020-09-21 ENCOUNTER — Telehealth: Payer: Self-pay | Admitting: Family Medicine

## 2020-09-21 NOTE — Telephone Encounter (Signed)
Patient left vm this morning stating she had an episode of diarrhea on Friday night. She states that she went to the Urologist on Friday and that you might can pull the blood work from there. She states that she is feeling weak.  CB# [phone]

## 2020-09-25 ENCOUNTER — Other Ambulatory Visit: Payer: Medicare Other

## 2020-10-02 ENCOUNTER — Other Ambulatory Visit: Payer: Self-pay | Admitting: Family Medicine

## 2020-10-07 ENCOUNTER — Ambulatory Visit: Payer: Medicare Other | Admitting: Nurse Practitioner

## 2020-10-08 ENCOUNTER — Ambulatory Visit: Payer: Medicare Other | Admitting: Nurse Practitioner

## 2020-10-08 NOTE — Progress Notes (Deleted)
Subjective:    Patient ID: Jaclyn Brooks, female    DOB: 1961/04/01, 59 y.o.   MRN: [id number]  HPI: Jaclyn Brooks is a 59 y.o. female presenting for  No chief complaint on file.  MIGRAINE Duration: {Blank single:19197::"chronic","days","weeks","months","years"} Onset: {Blank single:19197::"sudden","gradual"} Severity:  Frequency: {Blank single:19197::"constant","intermittent","occasional","rare","every few minutes","a few times a hour","a few times a day","a few times a week","a few times a month","a few times a year"} Location:  Headache duration: Radiation: {Blank single:19197::"yes","no"} Time of day headache occurs:  Alleviating factors:  Aggravating factors:  Headache status at time of visit: {Blank single:19197::"current headache","asymptomatic"} Treatments attempted: {Blank multiple:19196::"none","rest","ice","heat","APAP","ibuprofen","aleve", excedrine","triptans","propranolol","topamax","amitriptyline"}   Aura: {Blank single:19197::"yes","no"} Nausea:  {Blank single:19197::"yes","no"} Vomiting: {Blank single:19197::"yes","no"} Photophobia:  {Blank single:19197::"yes","no"} Phonophobia:  {Blank single:19197::"yes","no"} Effect on social functioning:  {Blank single:19197::"yes","no"} Numbers of missed days of school/work each month:  Confusion:  {Blank single:19197::"yes","no"} Gait disturbance/ataxia:  {Blank single:19197::"yes","no"} Behavioral changes:  {Blank single:19197::"yes","no"} Fevers:  {Blank single:19197::"yes","no"}  Allergies  Allergen Reactions  . Bee Venom Hives  . Nsaids     Kidney disease   . Shellfish Allergy Itching    Outpatient Encounter Medications as of 10/08/2020  Medication Sig  . acetaminophen (TYLENOL) 500 MG tablet Take 500-1,000 mg by mouth every 6 (six) hours as needed (for pain.).  Marland Kitchen albuterol (VENTOLIN HFA) 108 (90 Base) MCG/ACT inhaler Inhale 1-2 puffs into the lungs every 6 (six) hours as needed for wheezing or shortness of  breath.  Marland Kitchen atenolol (TENORMIN) 50 MG tablet Take 1 tablet by mouth once daily (Patient taking differently: Take 50 mg by mouth every evening. )  . Blood Glucose Monitoring Suppl (BLOOD GLUCOSE SYSTEM PAK) KIT Use as directed to monitor FSBS 3x weekly. Dx: E11.9. (Patient not taking: Reported on 08/28/2020)  . budesonide-formoterol (SYMBICORT) 80-4.5 MCG/ACT inhaler Inhale 2 puffs into the lungs 2 (two) times daily.  Marland Kitchen CARTIA XT 240 MG 24 hr capsule Take 1 capsule by mouth once daily (Patient taking differently: Take 240 mg by mouth daily. )  . clopidogrel (PLAVIX) 75 MG tablet Take 1 tablet (75 mg total) by mouth every evening.  Marland Kitchen dexlansoprazole (DEXILANT) 60 MG capsule Take 1 capsule (60 mg total) by mouth daily.  . Glucose Blood (BLOOD GLUCOSE TEST STRIPS) STRP Use as directed to monitor FSBS 3x weekly. Dx: E11.9. (Patient not taking: Reported on 08/28/2020)  . hydrochlorothiazide (HYDRODIURIL) 25 MG tablet Take 1 tablet by mouth once daily  . HYDROcodone-acetaminophen (NORCO) 5-325 MG tablet Take 1 tablet by mouth every 6 (six) hours as needed for moderate pain.  . Lancets MISC Use as directed to monitor FSBS 3x weekly. Dx: E11.9. (Patient not taking: Reported on 08/28/2020)  . ondansetron (ZOFRAN) 4 MG tablet Take 1 tablet (4 mg total) by mouth every 8 (eight) hours as needed for nausea or vomiting.  . promethazine (PHENERGAN) 12.5 MG tablet Take 1 tablet (12.5 mg total) by mouth every 8 (eight) hours as needed for nausea or vomiting.  . triamcinolone cream (KENALOG) 0.1 % Apply 1 application topically 2 (two) times daily. To affected areas on arms and legs  . [DISCONTINUED] atorvastatin (LIPITOR) 20 MG tablet Take 1 tablet (20 mg total) by mouth at bedtime. (Patient not taking: Reported on 01/24/2020)   No facility-administered encounter medications on file as of 10/08/2020.    Patient Active Problem List   Diagnosis Date Noted  . Abdominal pain 07/16/2020  . Diarrhea 07/16/2020  . Nausea  without vomiting 07/16/2020  . Diabetes mellitus without complication ([facility]) 29/56/2130  .  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 GERD (gastroesophageal reflux disease) 05/31/2019  . Solitary kidney, acquired 05/10/2019  . Carotid arterial disease ([facility]) 12/11/2018  . COPD, mild ([facility]) 10/01/2018  . CKD (chronic kidney disease) stage 3, GFR 30-59 ml/min ([facility]) 10/01/2018  . Aortic valve sclerosis 10/01/2018  . Hypertension 09/11/2018    Past Medical History:  Diagnosis Date  . Allergy   . Depression   . Gout   . Hyperlipidemia   . Hypertension   . 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 Interim medical history since our last visit reviewed.  Review of Systems  Per HPI unless specifically indicated above     Objective:    There were no vitals taken for this visit.  Wt Readings from Last 3 Encounters:  08/28/20 181 lb (82.1 kg)  07/17/20 178 lb (80.7 kg)  07/16/20 178 lb 14.4 oz (81.1 kg)    Physical Exam  Results for orders placed or performed during the hospital encounter of 08/10/20  SARS CORONAVIRUS 2 (TAT 6-24 HRS) Nasopharyngeal Nasopharyngeal Swab   Specimen: Nasopharyngeal Swab  Result Value Ref Range   SARS Coronavirus 2 NEGATIVE NEGATIVE      Assessment & Plan:   Problem List Items Addressed This Visit   None      Follow up plan: No follow-ups on file.

## 2020-10-14 ENCOUNTER — Other Ambulatory Visit (INDEPENDENT_AMBULATORY_CARE_PROVIDER_SITE_OTHER): Payer: Self-pay | Admitting: Gastroenterology

## 2020-10-14 DIAGNOSIS — R110 Nausea: Secondary | ICD-10-CM

## 2020-10-21 ENCOUNTER — Other Ambulatory Visit: Payer: Self-pay | Admitting: Family Medicine

## 2020-11-06 ENCOUNTER — Other Ambulatory Visit: Payer: Self-pay | Admitting: Family Medicine

## 2020-11-10 ENCOUNTER — Other Ambulatory Visit: Payer: Self-pay | Admitting: Family Medicine

## 2020-11-25 ENCOUNTER — Telehealth (INDEPENDENT_AMBULATORY_CARE_PROVIDER_SITE_OTHER): Payer: Self-pay | Admitting: Gastroenterology

## 2020-11-25 NOTE — Telephone Encounter (Signed)
Noted thank you

## 2020-11-25 NOTE — Telephone Encounter (Signed)
Patient left voice mail message stating she would like to reschedule her colonoscopy/EGD  - please advise-h# [phone]

## 2020-11-26 ENCOUNTER — Encounter: Payer: Self-pay | Admitting: Internal Medicine

## 2020-11-26 ENCOUNTER — Other Ambulatory Visit: Payer: Self-pay

## 2020-11-26 ENCOUNTER — Ambulatory Visit (INDEPENDENT_AMBULATORY_CARE_PROVIDER_SITE_OTHER): Payer: Medicare Other | Admitting: Internal Medicine

## 2020-11-26 VITALS — BP 153/79 | HR 50 | Temp 99.1°F | Resp 18 | Ht 66.0 in | Wt 175.0 lb

## 2020-11-26 DIAGNOSIS — R110 Nausea: Secondary | ICD-10-CM | POA: Diagnosis not present

## 2020-11-26 DIAGNOSIS — Z1211 Encounter for screening for malignant neoplasm of colon: Secondary | ICD-10-CM | POA: Diagnosis not present

## 2020-11-26 DIAGNOSIS — J449 Chronic obstructive pulmonary disease, unspecified: Secondary | ICD-10-CM

## 2020-11-26 DIAGNOSIS — M1990 Unspecified osteoarthritis, unspecified site: Secondary | ICD-10-CM | POA: Diagnosis not present

## 2020-11-26 DIAGNOSIS — E119 Type 2 diabetes mellitus without complications: Secondary | ICD-10-CM | POA: Diagnosis not present

## 2020-11-26 DIAGNOSIS — I6523 Occlusion and stenosis of bilateral carotid arteries: Secondary | ICD-10-CM

## 2020-11-26 DIAGNOSIS — Z1231 Encounter for screening mammogram for malignant neoplasm of breast: Secondary | ICD-10-CM

## 2020-11-26 DIAGNOSIS — N1832 Chronic kidney disease, stage 3b: Secondary | ICD-10-CM | POA: Diagnosis not present

## 2020-11-26 DIAGNOSIS — K219 Gastro-esophageal reflux disease without esophagitis: Secondary | ICD-10-CM

## 2020-11-26 DIAGNOSIS — I10 Essential (primary) hypertension: Secondary | ICD-10-CM | POA: Diagnosis not present

## 2020-11-26 DIAGNOSIS — Z7689 Persons encountering health services in other specified circumstances: Secondary | ICD-10-CM | POA: Diagnosis not present

## 2020-11-26 MED ORDER — HYDROCHLOROTHIAZIDE 25 MG PO TABS: 25.0000 mg | ORAL_TABLET | Freq: Every day | ORAL | 1 refills | Status: DC

## 2020-11-26 MED ORDER — METOCLOPRAMIDE HCL 10 MG PO TABS: 10.0000 mg | ORAL_TABLET | Freq: Four times a day (QID) | ORAL | 0 refills | Status: DC | PRN

## 2020-11-26 MED ORDER — CLOPIDOGREL BISULFATE 75 MG PO TABS: 75.0000 mg | ORAL_TABLET | Freq: Every evening | ORAL | 3 refills | Status: DC

## 2020-11-26 MED ORDER — ALBUTEROL SULFATE HFA 108 (90 BASE) MCG/ACT IN AERS: 1.0000 | INHALATION_SPRAY | Freq: Four times a day (QID) | RESPIRATORY_TRACT | 2 refills | Status: DC | PRN

## 2020-11-26 MED ORDER — MELOXICAM 15 MG PO TABS: 15.0000 mg | ORAL_TABLET | Freq: Every day | ORAL | 0 refills | Status: DC

## 2020-11-26 MED ORDER — ATENOLOL 50 MG PO TABS: 50.0000 mg | ORAL_TABLET | Freq: Every day | ORAL | 1 refills | Status: DC

## 2020-11-26 NOTE — Patient Instructions (Signed)
Please discontinue Dexilant for now until H Pylori testing after 2 weeks.  Please start taking Meloxicam instead of Diclofenac.  Please take Reglan as prescribed for nausea. Avoid hot and spicy food.  Please continue to take medications as prescribed.

## 2020-11-27 DIAGNOSIS — Z7689 Persons encountering health services in other specified circumstances: Secondary | ICD-10-CM | POA: Insufficient documentation

## 2020-11-27 NOTE — Assessment & Plan Note (Signed)
On Dexilant, persistent acid reflux Complains of nausea and bloating We will check for H. pylori after 2 weeks as she recently stopped taking Dexilant

## 2020-11-27 NOTE — Assessment & Plan Note (Addendum)
Last BMP reviewed in chart Follows up with Nephrologist Avoid nephrotoxic agents -had long discussion about stopping diclofenac, switched to Mobic for now for arthritis

## 2020-11-27 NOTE — Assessment & Plan Note (Signed)
Lab Results  °Component Value Date  ° HGBA1C 6.7 (H) 06/19/2020  ° °Diet controlled °Denies to start medication for now °

## 2020-11-27 NOTE — Assessment & Plan Note (Addendum)
BP Readings from Last 1 Encounters:  11/26/20 (!) 153/79   Uncontrolled with Atenolol, Cartia and HCTZ Would avoid medication adjustment for now as she has had BP wnl in the past visits with her PCP. Counseled for compliance with the medications Advised DASH diet and moderate exercise/walking, at least 150 mins/week

## 2020-11-27 NOTE — Assessment & Plan Note (Signed)
On Plavix Not on statin currently, plan to start it in the following visit Resistant for starting new medications

## 2020-11-27 NOTE — Progress Notes (Signed)
New Patient Office Visit  Subjective:  Patient ID: Jaclyn Brooks, female    DOB: June 08, 1961  Age: 60 y.o. MRN: [id number]  CC:  Chief Complaint  Patient presents with  . New Patient (Initial Visit)    New patient former dr Buelah Manis pt she has been having trouble out of her stomach hurts cramps and bubbles usually once a month has been going on the last two days this month     HPI Jaclyn Brooks is a 60 year old female with PMH of HTN, carotid artery stenosis, COPD, GERD, prediabetes, OA, CKD stage 3b and renal artery aneurysm who presents for establishing care. She is a former patient of Dr Buelah Manis.  She complains of acid reflux and chronic nausea, for which she has tried Zofran and Phenergan.  She is on Dexilant for acid reflux, which she stopped taking recently as it was not helping much.  Of note, she has been taking diclofenac for arthritis.  She denies any hematemesis, melena or hematochezia.  No EGD available to review in the chart.  Her BP was elevated in the office today, which could be due to her being nervous during the first visit.  She denies any chest pain, dyspnea or palpitations. she is upset as our office does not provide IV hydration when she would need it.  She has a history of CKD, for which she follows up with Nephrologist.  Denies any dysuria or hematuria currently.  She has been taking diclofenac as needed for arthritis. She is aware that it not good for her kidneys and can make her acid reflux worse.  She has a history of renal artery aneurysm and carotid artery stenosis.  She is currently taking Plavix.    Past Medical History:  Diagnosis Date  . Allergy   . Asthma    Phreesia 11/25/2020  . Chronic kidney disease    Phreesia 11/25/2020  . Depression   . Gout   . Hyperlipidemia   . Hypertension   . Renal arterial aneurysm [facility])     Past Surgical History:  Procedure Laterality Date  . ABDOMINAL HYSTERECTOMY    . CHOLECYSTECTOMY N/A 01/28/2015   Procedure:  LAPAROSCOPIC CHOLECYSTECTOMY;  Surgeon: Aviva Signs Md, MD;  Location: [facility]S;  Service: General;  Laterality: N/A;  . excision of bone spurs Bilateral    Big toes  . renal aneurysm Right    right coil-and only has function of left kidney  . X-STOP IMPLANTATION      Family History  Problem Relation Age of Onset  . Hypertension Mother   . Heart disease Mother        Cardiac Arrest   . Hypertension Father   . Hypertension Sister   . Cancer Brother        Bladder Cancer   . Hypertension Brother     Social History   Socioeconomic History  . Marital status: Single    Spouse name: Not on file  . Number of children: Not on file  . Years of education: Not on file  . Highest education level: Not on file  Occupational History  . Occupation: Retired  Tobacco Use  . Smoking status: Former Smoker    Packs/day: 0.25    Years: 20.00    Pack years: 5.00    Types: Cigarettes  . Smokeless tobacco: Never Used  Vaping Use  . Vaping Use: Never used  Substance and Sexual Activity  . Alcohol use: No  . Drug use: No  .  Sexual activity: Yes    Birth control/protection: Surgical  Other Topics Concern  . Not on file  Social History Narrative  . Not on file   Social Determinants of Health   Financial Resource Strain: Not on file  Food Insecurity: Not on file  Transportation Needs: Not on file  Physical Activity: Not on file  Stress: Not on file  Social Connections: Not on file  Intimate Partner Violence: Not on file    ROS Review of Systems  Constitutional: Negative for chills and fever.  HENT: Negative for congestion, sinus pressure, sinus pain and sore throat.   Eyes: Negative for pain and discharge.  Respiratory: Negative for cough and shortness of breath.   Cardiovascular: Negative for chest pain and palpitations.  Gastrointestinal: Positive for nausea. Negative for abdominal pain, constipation, diarrhea and vomiting.  Endocrine: Negative for polydipsia and polyuria.   Genitourinary: Negative for dysuria and hematuria.  Musculoskeletal: Positive for arthralgias and back pain. Negative for neck pain and neck stiffness.  Skin: Negative for rash.  Neurological: Negative for dizziness and weakness.  Psychiatric/Behavioral: Negative for agitation and behavioral problems.    Objective:   Today's Vitals: BP (!) 153/79 (BP Location: Right Arm, Patient Position: Sitting, Cuff Size: Normal)   Pulse (!) 50   Temp 99.1 F (37.3 C) (Oral)   Resp 18   Ht '5\' 6"'  (1.676 m)   Wt 175 lb (79.4 kg)   SpO2 98%   BMI 28.25 kg/m   Physical Exam Vitals reviewed.  Constitutional:      General: She is not in acute distress.    Appearance: She is not diaphoretic.  HENT:     Head: Normocephalic and atraumatic.     Nose: Nose normal.     Mouth/Throat:     Mouth: Mucous membranes are moist.  Eyes:     General: No scleral icterus.    Extraocular Movements: Extraocular movements intact.     Pupils: Pupils are equal, round, and reactive to light.  Cardiovascular:     Rate and Rhythm: Normal rate and regular rhythm.     Pulses: Normal pulses.     Heart sounds: Normal heart sounds. No murmur heard.   Pulmonary:     Breath sounds: Normal breath sounds. No wheezing or rales.  Abdominal:     Palpations: Abdomen is soft.     Tenderness: There is no abdominal tenderness.  Musculoskeletal:     Cervical back: Neck supple. No tenderness.     Right lower leg: No edema.     Left lower leg: No edema.  Skin:    General: Skin is warm.     Findings: No rash.  Neurological:     General: No focal deficit present.     Mental Status: She is alert and oriented to person, place, and time.  Psychiatric:        Mood and Affect: Mood normal.        Behavior: Behavior normal.     Assessment & Plan:   Problem List Items Addressed This Visit      Encounter to establish care - Primary   Care established History and medications reviewed with the patient         Cardiovascular and Mediastinum   Hypertension    BP Readings from Last 1 Encounters:  11/26/20 (!) 153/79   Uncontrolled with Atenolol, Cartia and HCTZ Would avoid medication adjustment for now as she has had BP wnl in the past visits with her PCP.  Counseled for compliance with the medications Advised DASH diet and moderate exercise/walking, at least 150 mins/week       Relevant Medications   atenolol (TENORMIN) 50 MG tablet   hydrochlorothiazide (HYDRODIURIL) 25 MG tablet   Carotid arterial disease (HCC)    On Plavix Not on statin currently, plan to start it in the following visit Resistant for starting new medications      Relevant Medications   atenolol (TENORMIN) 50 MG tablet   clopidogrel (PLAVIX) 75 MG tablet   hydrochlorothiazide (HYDRODIURIL) 25 MG tablet     Respiratory   COPD, mild (HCC)    Well controlled with Symbicort Albuterol as needed      Relevant Medications   albuterol (VENTOLIN HFA) 108 (90 Base) MCG/ACT inhaler     Digestive   GERD (gastroesophageal reflux disease)    On Dexilant, persistent acid reflux Complains of nausea and bloating We will check for H. pylori after 2 weeks as she recently stopped taking Dexilant      Relevant Medications   metoCLOPramide (REGLAN) 10 MG tablet     Endocrine   Diabetes mellitus without complication (HCC)    Lab Results  Component Value Date   HGBA1C 6.7 (H) 06/19/2020   Diet controlled Denies to start medication for now        Genitourinary   CKD (chronic kidney disease) stage 3, GFR 30-59 ml/min (HCC)    Last BMP reviewed in chart Follows up with Nephrologist Avoid nephrotoxic agents -had long discussion about stopping diclofenac, switched to Mobic for now for arthritis        Other         Other Visit Diagnoses    Nausea       Relevant Medications   metoCLOPramide (REGLAN) 10 MG tablet   Other Relevant Orders   H. pylori breath test   Arthritis       Relevant Medications    meloxicam (MOBIC) 15 MG tablet   Special screening for malignant neoplasms, colon       Relevant Orders   Cologuard   Screening mammogram for breast cancer       Relevant Orders   MM 3D SCREEN BREAST BILATERAL      Outpatient Encounter Medications as of 11/26/2020  Medication Sig  . acetaminophen (TYLENOL) 500 MG tablet Take 500-1,000 mg by mouth every 6 (six) hours as needed (for pain.).  Marland Kitchen Blood Glucose Monitoring Suppl (BLOOD GLUCOSE SYSTEM PAK) KIT Use as directed to monitor FSBS 3x weekly. Dx: E11.9.  . budesonide-formoterol (SYMBICORT) 80-4.5 MCG/ACT inhaler Inhale 2 puffs into the lungs 2 (two) times daily.  Marland Kitchen CARTIA XT 240 MG 24 hr capsule Take 1 capsule by mouth once daily  . dexlansoprazole (DEXILANT) 60 MG capsule Take 1 capsule (60 mg total) by mouth daily.  . Glucose Blood (BLOOD GLUCOSE TEST STRIPS) STRP Use as directed to monitor FSBS 3x weekly. Dx: E11.9.  . Lancets MISC Use as directed to monitor FSBS 3x weekly. Dx: E11.9.  . meloxicam (MOBIC) 15 MG tablet Take 1 tablet (15 mg total) by mouth daily.  . metoCLOPramide (REGLAN) 10 MG tablet Take 1 tablet (10 mg total) by mouth every 6 (six) hours as needed for nausea.  . [DISCONTINUED] albuterol (VENTOLIN HFA) 108 (90 Base) MCG/ACT inhaler Inhale 1-2 puffs into the lungs every 6 (six) hours as needed for wheezing or shortness of breath.  . [DISCONTINUED] atenolol (TENORMIN) 50 MG tablet Take 1 tablet by mouth once daily (Patient taking  differently: Take 50 mg by mouth every evening.)  . [DISCONTINUED] clopidogrel (PLAVIX) 75 MG tablet Take 1 tablet (75 mg total) by mouth every evening.  . [DISCONTINUED] Diclofenac Sodium CR 100 MG 24 hr tablet Take 1 tablet by mouth once daily  . [DISCONTINUED] hydrochlorothiazide (HYDRODIURIL) 25 MG tablet Take 1 tablet by mouth once daily  . [DISCONTINUED] ondansetron (ZOFRAN) 4 MG tablet TAKE 1 TABLET BY MOUTH EVERY 8 HOURS AS NEEDED FOR NAUSEA OR VOMITING  . [DISCONTINUED] promethazine  (PHENERGAN) 12.5 MG tablet Take 1 tablet (12.5 mg total) by mouth every 8 (eight) hours as needed for nausea or vomiting.  Marland Kitchen albuterol (VENTOLIN HFA) 108 (90 Base) MCG/ACT inhaler Inhale 1-2 puffs into the lungs every 6 (six) hours as needed for wheezing or shortness of breath.  Marland Kitchen atenolol (TENORMIN) 50 MG tablet Take 1 tablet (50 mg total) by mouth daily.  . clopidogrel (PLAVIX) 75 MG tablet Take 1 tablet (75 mg total) by mouth every evening.  . hydrochlorothiazide (HYDRODIURIL) 25 MG tablet Take 1 tablet (25 mg total) by mouth daily.  . [DISCONTINUED] atorvastatin (LIPITOR) 20 MG tablet Take 1 tablet (20 mg total) by mouth at bedtime. (Patient not taking: Reported on 01/24/2020)  . [DISCONTINUED] HYDROcodone-acetaminophen (NORCO) 5-325 MG tablet Take 1 tablet by mouth every 6 (six) hours as needed for moderate pain. (Patient not taking: Reported on 11/26/2020)  . [DISCONTINUED] triamcinolone cream (KENALOG) 0.1 % Apply 1 application topically 2 (two) times daily. To affected areas on arms and legs (Patient not taking: Reported on 11/26/2020)   No facility-administered encounter medications on file as of 11/26/2020.    Follow-up: Return in about 4 months (around 03/26/2021).   Lindell Spar, MD

## 2020-11-27 NOTE — Assessment & Plan Note (Signed)
Well controlled with Symbicort Albuterol as needed

## 2020-11-27 NOTE — Assessment & Plan Note (Signed)
Care established History and medications reviewed with the patient 

## 2020-12-02 ENCOUNTER — Ambulatory Visit (HOSPITAL_COMMUNITY): Payer: Medicare Other

## 2020-12-18 ENCOUNTER — Inpatient Hospital Stay (HOSPITAL_COMMUNITY): Admission: RE | Admit: 2020-12-18 | Payer: Medicare Other | Source: Ambulatory Visit

## 2020-12-30 ENCOUNTER — Ambulatory Visit: Payer: Medicare Other | Admitting: Orthopedic Surgery

## 2021-01-01 DIAGNOSIS — R110 Nausea: Secondary | ICD-10-CM | POA: Diagnosis not present

## 2021-01-03 LAB — H. PYLORI BREATH TEST: H pylori Breath Test: NEGATIVE

## 2021-01-11 DIAGNOSIS — N183 Chronic kidney disease, stage 3 unspecified: Secondary | ICD-10-CM | POA: Diagnosis not present

## 2021-01-11 DIAGNOSIS — I358 Other nonrheumatic aortic valve disorders: Secondary | ICD-10-CM | POA: Diagnosis not present

## 2021-01-11 DIAGNOSIS — N179 Acute kidney failure, unspecified: Secondary | ICD-10-CM | POA: Diagnosis not present

## 2021-01-11 DIAGNOSIS — J449 Chronic obstructive pulmonary disease, unspecified: Secondary | ICD-10-CM | POA: Diagnosis not present

## 2021-01-11 DIAGNOSIS — N189 Chronic kidney disease, unspecified: Secondary | ICD-10-CM | POA: Diagnosis not present

## 2021-01-11 DIAGNOSIS — I129 Hypertensive chronic kidney disease with stage 1 through stage 4 chronic kidney disease, or unspecified chronic kidney disease: Secondary | ICD-10-CM | POA: Diagnosis not present

## 2021-01-11 DIAGNOSIS — N2581 Secondary hyperparathyroidism of renal origin: Secondary | ICD-10-CM | POA: Diagnosis not present

## 2021-01-11 DIAGNOSIS — D631 Anemia in chronic kidney disease: Secondary | ICD-10-CM | POA: Diagnosis not present

## 2021-01-13 ENCOUNTER — Ambulatory Visit: Payer: Medicare Other | Admitting: Internal Medicine

## 2021-01-15 ENCOUNTER — Ambulatory Visit (INDEPENDENT_AMBULATORY_CARE_PROVIDER_SITE_OTHER): Payer: Medicare Other | Admitting: Orthopedic Surgery

## 2021-01-15 ENCOUNTER — Other Ambulatory Visit: Payer: Self-pay

## 2021-01-15 ENCOUNTER — Encounter: Payer: Self-pay | Admitting: Orthopedic Surgery

## 2021-01-15 ENCOUNTER — Ambulatory Visit: Payer: Self-pay

## 2021-01-15 DIAGNOSIS — M25522 Pain in left elbow: Secondary | ICD-10-CM | POA: Diagnosis not present

## 2021-01-15 MED ORDER — ACETAMINOPHEN-CODEINE #3 300-30 MG PO TABS: 1.0000 | ORAL_TABLET | Freq: Four times a day (QID) | ORAL | 0 refills | Status: DC | PRN

## 2021-01-15 MED ORDER — CEPHALEXIN 500 MG PO CAPS: ORAL_CAPSULE | ORAL | 0 refills | Status: DC

## 2021-01-15 NOTE — Progress Notes (Signed)
Office Visit Note   Patient: Jaclyn Brooks           Date of Birth: [birth year]           MRN: [id number] Visit Date: 01/15/2021 Requested by: Lindell Spar, MD [address] [place],  [place] [address] PCP: Lindell Spar, MD  Subjective: Chief Complaint  Patient presents with  . Other     Left elbow pain    HPI: Jaclyn Brooks is a 60 year old patient with left elbow pain.  She states "I cannot do anything".  She is had moderately severe pain for 2 days along with some fevers and chills.  She states that her elbow is warm to touch.  Has a known history of gout but it usually affects her feet.  She is not currently on medication for gout.  She has had olecranon bursitis in the past treated with aspiration and antibiotics.  Denies any neck pain or numbness and tingling.              ROS: Review of systems notable for some fevers and malaise over the past 24 to 48 hours.  Assessment & Plan: Visit Diagnoses:  1. Pain in left elbow     Plan: Impression is possible early infectious olecranon bursitis without actual fluid collection in the bursa.  Ultrasound examination does show some boggy bursitis but no discrete drainable fluid collections.  Also no fluid collection in the elbow itself.  It is possible that this could be gout or early infected olecranon bursitis.  She is allergic to anti-inflammatories and therefore cannot really take colchicine or standard anti-inflammatories.  Plan at this time is Keflex along with Tylenol 3 for pain.  CBC differential sed rate C-reactive protein uric acid also drawn today.  Return to clinic on Monday for clinical recheck.  May need further imaging if her symptoms are not improving.  Follow-Up Instructions: No follow-ups on file.   Orders:  Orders Placed This Encounter  Procedures  . XR Elbow 2 Views Left  . Sed Rate (ESR)  . CBC with Differential  . C-reactive protein  . Uric acid   Meds ordered this encounter  Medications  . cephALEXin (KEFLEX)  500 MG capsule    Sig: 1 po tid x 5 days    Dispense:  15 capsule    Refill:  0  . acetaminophen-codeine (TYLENOL #3) 300-30 MG tablet    Sig: Take 1 tablet by mouth every 6 (six) hours as needed for moderate pain.    Dispense:  30 tablet    Refill:  0      Procedures: No procedures performed   Clinical Data: No additional findings.  Objective: Vital Signs: There were no vitals taken for this visit.  Physical Exam:   Constitutional: Patient appears well-developed HEENT:  Head: Normocephalic Eyes:EOM are normal Neck: Normal range of motion Cardiovascular: Normal rate Pulmonary/chest: Effort normal Neurologic: Patient is alert Skin: Skin is warm Psychiatric: Patient has normal mood and affect    Ortho Exam: Ortho exam demonstrates tenderness to palpation around the olecranon of that left elbow.  There are some warmth around the olecranon but no induration or erythema.  No pain with pronation supination of the forearm on the left.  No axillary lymphadenopathy.  Ultrasound examination demonstrates no discrete drainable fluid collection present but there is boggy bursitis present.  No large joint effusion is present.  No tissue crepitus with palpation.  Specialty Comments:  No specialty comments available.  Imaging: XR Elbow 2 Views Left  Result Date: 01/15/2021 [facility] lateral radiographs left elbow reviewed.  No acute fracture.  No joint effusion.  No significant degenerative changes present.  Normal radiographs left elbow    PMFS History: Patient Active Problem List   Diagnosis Date Noted  . Encounter to establish care 11/27/2020  . Abdominal pain 07/16/2020  . Diarrhea 07/16/2020  . Nausea without vomiting 07/16/2020  . Diabetes mellitus without complication ([place]) 74/73/4037  . 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 GERD (gastroesophageal reflux disease) 05/31/2019  . Solitary kidney, acquired 05/10/2019  . Carotid arterial disease ([place]) 12/11/2018  .  COPD, mild ([place]) 10/01/2018  . CKD (chronic kidney disease) stage 3, GFR 30-59 ml/min (HCC) 10/01/2018  . Aortic valve sclerosis 10/01/2018  . Hypertension 09/11/2018   Past Medical History:  Diagnosis Date  . Allergy   . Asthma    Phreesia 11/25/2020  . Chronic kidney disease    Phreesia 11/25/2020  . Depression   . Gout   . Hyperlipidemia   . Hypertension   . 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 Hypertension Mother   . Heart disease Mother        Cardiac Arrest   . Hypertension Father   . Hypertension Sister   . Cancer Brother        Bladder Cancer   . Hypertension Brother     Past Surgical History:  Procedure Laterality Date  . ABDOMINAL HYSTERECTOMY    . CHOLECYSTECTOMY N/A 01/28/2015   Procedure: LAPAROSCOPIC CHOLECYSTECTOMY;  Surgeon: Aviva Signs Md, MD;  Location: [facility];  Service: General;  Laterality: N/A;  . excision of bone spurs Bilateral    Big toes  . renal aneurysm Right    right coil-and only has function of left kidney  . X-STOP IMPLANTATION     Social History   Occupational History  . Occupation: Retired  Tobacco Use  . Smoking status: Former Smoker    Packs/day: 0.25    Years: 20.00    Pack years: 5.00    Types: Cigarettes  . Smokeless tobacco: Never Used  Vaping Use  . Vaping Use: Never used  Substance and Sexual Activity  . Alcohol use: No  . Drug use: No  . Sexual activity: Yes    Birth control/protection: Surgical

## 2021-01-16 LAB — CBC WITH DIFFERENTIAL/PLATELET
Absolute Monocytes: 655 cells/uL (ref 200–950)
Basophils Absolute: 34 cells/uL (ref 0–200)
Basophils Relative: 0.4 %
Eosinophils Absolute: 143 cells/uL (ref 15–500)
Eosinophils Relative: 1.7 %
HCT: 39.3 % (ref 35.0–45.0)
Hemoglobin: 13.3 g/dL (ref 11.7–15.5)
Lymphs Abs: 1898 cells/uL (ref 850–3900)
MCH: 32.5 pg (ref 27.0–33.0)
MCHC: 33.8 g/dL (ref 32.0–36.0)
MCV: 96.1 fL (ref 80.0–100.0)
MPV: 10.3 fL (ref 7.5–12.5)
Monocytes Relative: 7.8 %
Neutro Abs: 5670 cells/uL (ref 1500–7800)
Neutrophils Relative %: 67.5 %
Platelets: 313 10*3/uL (ref 140–400)
RBC: 4.09 10*6/uL (ref 3.80–5.10)
RDW: 13.3 % (ref 11.0–15.0)
Total Lymphocyte: 22.6 %
WBC: 8.4 10*3/uL (ref 3.8–10.8)

## 2021-01-16 LAB — C-REACTIVE PROTEIN: CRP: 57.2 mg/L — ABNORMAL HIGH (ref <8.0)

## 2021-01-16 LAB — URIC ACID: Uric Acid, Serum: 10.4 mg/dL — ABNORMAL HIGH (ref 2.5–7.0)

## 2021-01-16 LAB — SEDIMENTATION RATE: Sed Rate: 50 mm/h — ABNORMAL HIGH (ref 0–30)

## 2021-01-18 ENCOUNTER — Ambulatory Visit (INDEPENDENT_AMBULATORY_CARE_PROVIDER_SITE_OTHER): Payer: Medicare Other | Admitting: Orthopedic Surgery

## 2021-01-18 DIAGNOSIS — M25522 Pain in left elbow: Secondary | ICD-10-CM

## 2021-01-18 MED ORDER — PREDNISONE 5 MG (21) PO TBPK: ORAL_TABLET | ORAL | 0 refills | Status: DC

## 2021-01-21 ENCOUNTER — Ambulatory Visit: Payer: Medicare Other | Admitting: Internal Medicine

## 2021-01-21 ENCOUNTER — Encounter: Payer: Self-pay | Admitting: Orthopedic Surgery

## 2021-01-21 NOTE — Progress Notes (Signed)
Office Visit Note   Patient: Jaclyn Brooks           Date of Birth: 15-Jan-1961           MRN: [id number] Visit Date: 01/18/2021 Requested by: Lindell Spar, MD [address] [place],  [place] [address] PCP: Lindell Spar, MD  Subjective: Chief Complaint  Patient presents with  . Left Elbow - Follow-up    HPI: Patient presents for follow-up of left elbow.  Labs came back with uric acid of 10.4.  She has also been on antibiotics.  Overall she is feeling slightly better.  She denies any foot pain but is reporting a little bit of neck pain.  Denies any fevers or chills.             ROS: All systems reviewed are negative as they relate to the chief complaint within the history of present illness.  Patient denies  fevers or chills.   Assessment & Plan: Visit Diagnoses:  1. Pain in left elbow     Plan: Impression is likely gout attack in the elbow possibly in the lower cervical spine region as well.  She can take standard gout medicine such as colchicine and indomethacin due to her kidney function.  Plan to continue Tylenol 3 and finish out her Keflex and then we will also put her on a Medrol Dosepak 6-day course with return to clinic in 7 days.  If she has resolved her symptoms by then we may need to start her on allopurinol.  I would likely coordinate that with her primary care physician Dr. Posey Pronto.  Follow-Up Instructions: Return in about 1 week (around 01/25/2021).   Orders:  No orders of the defined types were placed in this encounter.  Meds ordered this encounter  Medications  . predniSONE (STERAPRED UNI-PAK 21 TAB) 5 MG (21) TBPK tablet    Sig: Take dosepak as directed    Dispense:  21 tablet    Refill:  0      Procedures: No procedures performed   Clinical Data: No additional findings.  Objective: Vital Signs: There were no vitals taken for this visit.  Physical Exam:   Constitutional: Patient appears well-developed HEENT:  Head: Normocephalic Eyes:EOM are  normal Neck: Normal range of motion Cardiovascular: Normal rate Pulmonary/chest: Effort normal Neurologic: Patient is alert Skin: Skin is warm Psychiatric: Patient has normal mood and affect    Ortho Exam: Ortho exam demonstrates improved range of motion and less sensitivity to exam in the left elbow.  Radial pulses intact.  No pain with pronation supination of the elbow.  No induration fluid collection or erythema along the olecranon tip.  No proximal lymphadenopathy on that left arm.  Neck range of motion is full.  Specialty Comments:  No specialty comments available.  Imaging: No results found.   PMFS History: Patient Active Problem List   Diagnosis Date Noted  . Encounter to establish care 11/27/2020  . Abdominal pain 07/16/2020  . Diarrhea 07/16/2020  . Nausea without vomiting 07/16/2020  . Diabetes mellitus without complication ([place]) 73/71/0626  . 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 GERD (gastroesophageal reflux disease) 05/31/2019  . Solitary kidney, acquired 05/10/2019  . Carotid arterial disease ([place]) 12/11/2018  . COPD, mild ([place]) 10/01/2018  . CKD (chronic kidney disease) stage 3, GFR 30-59 ml/min (HCC) 10/01/2018  . Aortic valve sclerosis 10/01/2018  . Hypertension 09/11/2018   Past Medical History:  Diagnosis Date  . Allergy   .  Asthma    Phreesia 11/25/2020  . Chronic kidney disease    Phreesia 11/25/2020  . Depression   . Gout   . Hyperlipidemia   . Hypertension   . 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 Hypertension Mother   . Heart disease Mother        Cardiac Arrest   . Hypertension Father   . Hypertension Sister   . Cancer Brother        Bladder Cancer   . Hypertension Brother     Past Surgical History:  Procedure Laterality Date  . ABDOMINAL HYSTERECTOMY    . CHOLECYSTECTOMY N/A 01/28/2015   Procedure: LAPAROSCOPIC CHOLECYSTECTOMY;  Surgeon: Aviva Signs Md, MD;  Location: [facility];  Service: General;  Laterality: N/A;  . excision of bone spurs Bilateral    Big toes  . renal aneurysm Right    right coil-and only has function of left kidney  . X-STOP IMPLANTATION     Social History   Occupational History  . Occupation: Retired  Tobacco Use  . Smoking status: Former Smoker    Packs/day: 0.25    Years: 20.00    Pack years: 5.00    Types: Cigarettes  . Smokeless tobacco: Never Used  Vaping Use  . Vaping Use: Never used  Substance and Sexual Activity  . Alcohol use: No  . Drug use: No  . Sexual activity: Yes    Birth control/protection: Surgical

## 2021-01-23 ENCOUNTER — Other Ambulatory Visit: Payer: Self-pay | Admitting: Family Medicine

## 2021-01-26 ENCOUNTER — Other Ambulatory Visit: Payer: Self-pay

## 2021-01-26 ENCOUNTER — Telehealth: Payer: Self-pay

## 2021-01-26 MED ORDER — CARTIA XT 240 MG PO CP24
240.0000 mg | ORAL_CAPSULE | Freq: Every day | ORAL | 0 refills | Status: DC
Start: 2021-01-26 — End: 2021-03-25

## 2021-01-26 NOTE — Telephone Encounter (Signed)
Please send Medication  Cartis XT 240 mg 24 hr Capsule  Please send  [facility] in [place]

## 2021-01-26 NOTE — Telephone Encounter (Signed)
Med sent.

## 2021-01-28 ENCOUNTER — Other Ambulatory Visit: Payer: Self-pay | Admitting: *Deleted

## 2021-01-28 DIAGNOSIS — Z1211 Encounter for screening for malignant neoplasm of colon: Secondary | ICD-10-CM

## 2021-01-28 NOTE — Progress Notes (Signed)
amsb

## 2021-02-05 ENCOUNTER — Ambulatory Visit: Payer: Medicare Other | Admitting: Surgical

## 2021-02-11 ENCOUNTER — Other Ambulatory Visit: Payer: Self-pay | Admitting: Internal Medicine

## 2021-02-11 DIAGNOSIS — Z1231 Encounter for screening mammogram for malignant neoplasm of breast: Secondary | ICD-10-CM

## 2021-02-15 ENCOUNTER — Ambulatory Visit: Payer: Medicare Other | Admitting: Orthopedic Surgery

## 2021-03-01 ENCOUNTER — Emergency Department (HOSPITAL_COMMUNITY): Payer: Medicare Other

## 2021-03-01 ENCOUNTER — Encounter (HOSPITAL_COMMUNITY): Payer: Self-pay

## 2021-03-01 ENCOUNTER — Emergency Department (HOSPITAL_COMMUNITY)
Admission: EM | Admit: 2021-03-01 | Discharge: 2021-03-01 | Disposition: A | Discharge status: Home / Self Care | Payer: Medicare Other | Attending: Emergency Medicine | Admitting: Emergency Medicine

## 2021-03-01 ENCOUNTER — Other Ambulatory Visit: Payer: Self-pay

## 2021-03-01 DIAGNOSIS — Z87891 Personal history of nicotine dependence: Secondary | ICD-10-CM | POA: Diagnosis not present

## 2021-03-01 DIAGNOSIS — Z79899 Other long term (current) drug therapy: Secondary | ICD-10-CM | POA: Diagnosis not present

## 2021-03-01 DIAGNOSIS — Z7984 Long term (current) use of oral hypoglycemic drugs: Secondary | ICD-10-CM | POA: Diagnosis not present

## 2021-03-01 DIAGNOSIS — J45909 Unspecified asthma, uncomplicated: Secondary | ICD-10-CM | POA: Insufficient documentation

## 2021-03-01 DIAGNOSIS — M064 Inflammatory polyarthropathy: Secondary | ICD-10-CM | POA: Diagnosis not present

## 2021-03-01 DIAGNOSIS — I2510 Atherosclerotic heart disease of native coronary artery without angina pectoris: Secondary | ICD-10-CM | POA: Diagnosis not present

## 2021-03-01 DIAGNOSIS — M25561 Pain in right knee: Secondary | ICD-10-CM | POA: Diagnosis present

## 2021-03-01 DIAGNOSIS — Z7902 Long term (current) use of antithrombotics/antiplatelets: Secondary | ICD-10-CM | POA: Insufficient documentation

## 2021-03-01 DIAGNOSIS — Z794 Long term (current) use of insulin: Secondary | ICD-10-CM | POA: Diagnosis not present

## 2021-03-01 DIAGNOSIS — M130 Polyarthritis, unspecified: Secondary | ICD-10-CM

## 2021-03-01 DIAGNOSIS — I129 Hypertensive chronic kidney disease with stage 1 through stage 4 chronic kidney disease, or unspecified chronic kidney disease: Secondary | ICD-10-CM | POA: Insufficient documentation

## 2021-03-01 DIAGNOSIS — M25461 Effusion, right knee: Secondary | ICD-10-CM | POA: Insufficient documentation

## 2021-03-01 DIAGNOSIS — E1122 Type 2 diabetes mellitus with diabetic chronic kidney disease: Secondary | ICD-10-CM | POA: Insufficient documentation

## 2021-03-01 DIAGNOSIS — N183 Chronic kidney disease, stage 3 unspecified: Secondary | ICD-10-CM | POA: Insufficient documentation

## 2021-03-01 DIAGNOSIS — M7989 Other specified soft tissue disorders: Secondary | ICD-10-CM | POA: Diagnosis not present

## 2021-03-01 MED ORDER — METHYLPREDNISOLONE SODIUM SUCC 125 MG IJ SOLR
80.0000 mg | Freq: Once | INTRAMUSCULAR | Status: AC
  Administered 2021-03-01: 80 mg via INTRAMUSCULAR
  Filled 2021-03-01: qty 2

## 2021-03-01 MED ORDER — METHYLPREDNISOLONE 4 MG PO TBPK: ORAL_TABLET | ORAL | 0 refills | Status: DC

## 2021-03-01 NOTE — Discharge Instructions (Addendum)
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    You have been treated with steroids here in the ED and discharged home with Solu-Medrol Dosepak.  I have also provided you with an ambulatory referral to rheumatology which means that they will call you to schedule appointment for evaluation.  While your joint swelling could be reflective of gout or pseudogout, I have higher suspicion for autoimmune etiology.  Please also follow-up with your primary care provider for your appointment in June, as scheduled.  Return to the ER seek immediate medical attention should you experience any new or worsening symptoms.

## 2021-03-01 NOTE — ED Triage Notes (Signed)
Pt presents to ED with complaints of right knee and ankle pain since Wednesday, denies injury

## 2021-03-01 NOTE — ED Provider Notes (Signed)
[facility] EMERGENCY DEPARTMENT Provider Note   CSN: [id number] Arrival date & time: 03/01/21  1008     History Chief Complaint  Patient presents with  . Knee Pain    Jaclyn Brooks is a 60 y.o. female with PMH of gout who presents the ED with complaints of atraumatic right knee and ankle pain.  I reviewed patient's medical record and she was seen by her orthopedist, Dr. Marlou Sa at [facility], on 01/18/2021 for left elbow pain follow-up.  She was noted to have elevated uric acid of 10.4 and she was ultimately discharged home with prednisone taper given suspicion of gout.  There is also discussion about being initiated on allopurinol.  On my examination, patient states that she never follow-up with her primary care provider because her PCP has left.  She has an appointment with a new PCP scheduled for June.  She tells me that she has been having multiple joint involvement ever since she had a COVID-19 booster earlier this year.  She states that it started in her left elbow and then went to her left thumb, left ankle, right ankle, and now right knee.  She states that the inflammation appears to be moving around.  She denies any history of autoimmune disease.  Labs from 01/15/2021 showed elevated inflammatory markers and uric acid which is why gouty arthritis versus pseudogout was presumed.  She denies any history of IVDA, fevers or chills, inability to range the joints, redness, warmth, or other symptoms.  She states that her symptoms have actually been improving over the course of the past few days.  The reason why she is coming now is because she can finally ambulate with a walker whereas initially she was having too much pain and swelling.  HPI     Past Medical History:  Diagnosis Date  . Allergy   . Asthma    Phreesia 11/25/2020  . Chronic kidney disease    Phreesia 11/25/2020  . Depression   . Gout   . Hyperlipidemia   . Hypertension   . 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 Encounter to establish care 11/27/2020  . Abdominal pain 07/16/2020  . Diarrhea 07/16/2020  . Nausea without vomiting 07/16/2020  . Diabetes mellitus without complication ([place]) 00/17/4944  . 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 GERD (gastroesophageal reflux disease) 05/31/2019  . Solitary kidney, acquired 05/10/2019  . Carotid arterial disease ([place]) 12/11/2018  . COPD, mild ([place]) 10/01/2018  . CKD (chronic kidney disease) stage 3, GFR 30-59 ml/min ([facility]) 10/01/2018  . Aortic valve sclerosis 10/01/2018  . Hypertension 09/11/2018    Past Surgical History:  Procedure Laterality Date  . ABDOMINAL HYSTERECTOMY    . CHOLECYSTECTOMY N/A 01/28/2015   Procedure: LAPAROSCOPIC CHOLECYSTECTOMY;  Surgeon: Aviva Signs Md, MD;  Location: [facility];  Service: General;  Laterality: N/A;  . excision of bone spurs Bilateral    Big toes  . renal aneurysm Right    right coil-and only has function of left kidney  . X-STOP IMPLANTATION       OB History    Gravida  1   Para  1   Term      Preterm  1   AB      Living        SAB      IAB      Ectopic      Multiple  Live Births              Family History  Problem Relation Age of Onset  . Hypertension Mother   . Heart disease Mother        Cardiac Arrest   . Hypertension Father   . Hypertension Sister   . Cancer Brother        Bladder Cancer   . Hypertension Brother     Social History   Tobacco Use  . Smoking status: Former Smoker    Packs/day: 0.25    Years: 20.00    Pack years: 5.00    Types: Cigarettes  . Smokeless tobacco: Never Used  Vaping Use  . Vaping Use: Never used  Substance Use Topics  . Alcohol use: No  . Drug use: No    Home Medications Prior to Admission medications   Medication Sig Start Date End Date Taking? Authorizing Provider  methylPREDNISolone (MEDROL DOSEPAK) 4 MG TBPK tablet Use as directed. 03/01/21  Yes  Corena Herter, PA-C  acetaminophen (TYLENOL) 500 MG tablet Take 500-1,000 mg by mouth every 6 (six) hours as needed (for pain.).    [provider]  acetaminophen-codeine (TYLENOL #3) 300-30 MG tablet Take 1 tablet by mouth every 6 (six) hours as needed for moderate pain. 01/15/21   Meredith Pel, MD  albuterol (VENTOLIN HFA) 108 (90 Base) MCG/ACT inhaler Inhale 1-2 puffs into the lungs every 6 (six) hours as needed for wheezing or shortness of breath. 11/26/20   Lindell Spar, MD  atenolol (TENORMIN) 50 MG tablet Take 1 tablet (50 mg total) by mouth daily. 11/26/20   Lindell Spar, MD  Blood Glucose Monitoring Suppl (BLOOD GLUCOSE SYSTEM PAK) KIT Use as directed to monitor FSBS 3x weekly. Dx: E11.9. 07/03/20   Alycia Rossetti, MD  budesonide-formoterol [facility]) 80-4.5 MCG/ACT inhaler Inhale 2 puffs into the lungs 2 (two) times daily. 08/28/20   Altus, Modena Nunnery, MD  CARTIA XT 240 MG 24 hr capsule Take 1 capsule (240 mg total) by mouth daily. 01/26/21   Lindell Spar, MD  cephALEXin (KEFLEX) 500 MG capsule 1 po tid x 5 days 01/15/21   Meredith Pel, MD  clopidogrel (PLAVIX) 75 MG tablet Take 1 tablet (75 mg total) by mouth every evening. 11/26/20   Lindell Spar, MD  dexlansoprazole (DEXILANT) 60 MG capsule Take 1 capsule (60 mg total) by mouth daily. 06/19/20   , Modena Nunnery, MD  Glucose Blood (BLOOD GLUCOSE TEST STRIPS) STRP Use as directed to monitor FSBS 3x weekly. Dx: E11.9. 07/03/20   Alycia Rossetti, MD  hydrochlorothiazide (HYDRODIURIL) 25 MG tablet Take 1 tablet (25 mg total) by mouth daily. 11/26/20   Lindell Spar, MD  Lancets MISC Use as directed to monitor FSBS 3x weekly. Dx: E11.9. 07/03/20   Alycia Rossetti, MD  meloxicam (MOBIC) 15 MG tablet Take 1 tablet (15 mg total) by mouth daily. 11/26/20   Lindell Spar, MD  metoCLOPramide (REGLAN) 10 MG tablet Take 1 tablet (10 mg total) by mouth every 6 (six) hours as needed for nausea. 11/26/20   Lindell Spar, MD  predniSONE (STERAPRED UNI-PAK 21 TAB) 5 MG (21) TBPK tablet Take dosepak as directed 01/18/21   Meredith Pel, MD  atorvastatin (LIPITOR) 20 MG tablet Take 1 tablet (20 mg total) by mouth at bedtime. Patient not taking: Reported on 01/24/2020 11/25/19 02/18/20  Alycia Rossetti, MD    Allergies  Bee venom, Nsaids, and Shellfish allergy  Review of Systems   Review of Systems  All other systems reviewed and are negative.   Physical Exam Updated Vital Signs BP 133/86 (BP Location: Right Arm)   Pulse 60   Temp 98.9 F (37.2 C) (Oral)   Resp 18   Ht _0  (1.676 m)   Wt 78.9 kg   SpO2 100%   BMI 28.08 kg/m   Physical Exam Vitals and nursing note reviewed. Exam conducted with a chaperone present.  Constitutional:      General: She is not in acute distress.    Appearance: Normal appearance. She is not ill-appearing or toxic-appearing.  HENT:     Head: Normocephalic and atraumatic.  Eyes:     General: No scleral icterus.    Conjunctiva/sclera: Conjunctivae normal.  Cardiovascular:     Rate and Rhythm: Normal rate.  Pulmonary:     Effort: Pulmonary effort is normal.  Musculoskeletal:        General: Swelling and tenderness present. No deformity or signs of injury.     Right lower leg: No edema.     Left lower leg: No edema.     Comments: Right knee: Effusion appreciated.  No overlying erythema or significant warmth noted.  No induration.  No evidence of trauma or injury.  TTP most notably over medial aspect.  Passive range of motion largely intact from 180 degrees to 90 degrees.   Right ankle: Mild swelling relative to contralateral ankle.  Pedal pulse intact.  Sensation intact throughout.  AROM mildly limited due to pain, but intact. Right hip: Normal. No areas of swelling aside from ankle and knee.  Skin:    General: Skin is dry.  Neurological:     General: No focal deficit present.     Mental Status: She is alert and oriented to person, place, and  time.     GCS: GCS eye subscore is 4. GCS verbal subscore is 5. GCS motor subscore is 6.     Cranial Nerves: No cranial nerve deficit.     Sensory: No sensory deficit.     Motor: No weakness.     Coordination: Coordination normal.  Psychiatric:        Mood and Affect: Mood normal.        Behavior: Behavior normal.        Thought Content: Thought content normal.     ED Results / Procedures / Treatments   Labs (all labs ordered are listed, but only abnormal results are displayed) Labs Reviewed - No data to display  EKG None  Radiology DG Knee 2 Views Right  Result Date: 03/01/2021 CLINICAL DATA:  Right knee and ankle pain.  No known injury. EXAM: RIGHT KNEE - 1-2 VIEW; RIGHT ANKLE - COMPLETE 3+ VIEW COMPARISON:  None. FINDINGS: Right ankle: Mild soft tissue swelling overlying the medial malleolus. No fracture or dislocation. Right knee: Mild spurring of the medial, lateral, and patellofemoral compartments. Small suprapatellar knee joint effusion. Small rounded os ossific density in the posterior knee joint suspicious for a loose body. IMPRESSION: 1. Mild soft tissue swelling overlying the medial malleolus without underlying acute fracture or dislocation. 2. Small knee joint effusion.  No fracture or dislocation. Electronically Signed   By: Miachel Roux M.D.   On: 03/01/2021 12:56   DG Ankle Complete Right  Result Date: 03/01/2021 CLINICAL DATA:  Right knee and ankle pain.  No known injury. EXAM: RIGHT KNEE - 1-2 VIEW; RIGHT ANKLE -  COMPLETE 3+ VIEW COMPARISON:  None. FINDINGS: Right ankle: Mild soft tissue swelling overlying the medial malleolus. No fracture or dislocation. Right knee: Mild spurring of the medial, lateral, and patellofemoral compartments. Small suprapatellar knee joint effusion. Small rounded os ossific density in the posterior knee joint suspicious for a loose body. IMPRESSION: 1. Mild soft tissue swelling overlying the medial malleolus without underlying acute fracture or  dislocation. 2. Small knee joint effusion.  No fracture or dislocation. Electronically Signed   By: Miachel Roux M.D.   On: 03/01/2021 12:56    Procedures Procedures   Medications Ordered in ED Medications  methylPREDNISolone sodium succinate (SOLU-MEDROL) 125 mg/2 mL injection 80 mg (80 mg Intramuscular Given 03/01/21 1449)    ED Course  I have reviewed the triage vital signs and the nursing notes.  Pertinent labs & imaging results that were available during my care of the patient were reviewed by me and considered in my medical decision making (see chart for details).    MDM Rules/Calculators/A&P                          Shonda LEANER MORICI was evaluated in Emergency Department on 03/01/2021 for the symptoms described in the history of present illness. She was evaluated in the context of the global COVID-19 pandemic, which necessitated consideration that the patient might be at risk for infection with the SARS-CoV-2 virus that causes COVID-19. Institutional protocols and algorithms that pertain to the evaluation of patients at risk for COVID-19 are in a state of rapid change based on information released by regulatory bodies including the CDC and federal and state organizations. These policies and algorithms were followed during the patient's care in the ED.  I personally reviewed patient's medical chart and all notes from triage and staff during today's encounter. I have also ordered and reviewed all labs and imaging that I felt to be medically necessary in the evaluation of this patient's complaints and with consideration of their physical exam. If needed, translation services were available and utilized.   Patient with inflammatory arthritis versus autoimmune.  Given that it is moving around various small joints, concern for RA or other autoimmune polyarthropathy.  Will provide ambulatory referral to rheumatology in addition to prednisone Dosepak.  We will first treat with Solu-Medrol here in  the ED.  She has an NSAID allergy  Given lack of any fevers at home and reassuring AROM and PROM, lower suspicion for septic joint.  She is not ill-appearing and she is ambulatory.  She states that her symptoms have actually been improving.  She is neurovascularly intact.  Denies any history of IVDA.  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  Discussed case with Dr. Rogene Houston who agrees with assessment and plan.   Final Clinical Impression(s) / ED Diagnoses Final diagnoses:  Polyarthropathy    Rx / DC Orders ED Discharge Orders         Ordered    Ambulatory referral to Rheumatology        03/01/21 1444    methylPREDNISolone (MEDROL DOSEPAK) 4 MG TBPK tablet        03/01/21 1506           Reita Chard 03/01/21 1508    Fredia Sorrow, MD 03/06/21 [phone]

## 2021-03-04 ENCOUNTER — Encounter: Payer: Self-pay | Admitting: Rheumatology

## 2021-03-04 ENCOUNTER — Ambulatory Visit (INDEPENDENT_AMBULATORY_CARE_PROVIDER_SITE_OTHER): Payer: Medicare Other | Admitting: Rheumatology

## 2021-03-04 ENCOUNTER — Ambulatory Visit: Payer: Self-pay

## 2021-03-04 ENCOUNTER — Other Ambulatory Visit: Payer: Self-pay

## 2021-03-04 VITALS — BP 158/76 | HR 44 | Resp 15 | Ht 65.5 in | Wt 174.0 lb

## 2021-03-04 DIAGNOSIS — N1832 Chronic kidney disease, stage 3b: Secondary | ICD-10-CM

## 2021-03-04 DIAGNOSIS — I6523 Occlusion and stenosis of bilateral carotid arteries: Secondary | ICD-10-CM | POA: Diagnosis not present

## 2021-03-04 DIAGNOSIS — M79672 Pain in left foot: Secondary | ICD-10-CM | POA: Diagnosis not present

## 2021-03-04 DIAGNOSIS — J449 Chronic obstructive pulmonary disease, unspecified: Secondary | ICD-10-CM | POA: Diagnosis not present

## 2021-03-04 DIAGNOSIS — M79641 Pain in right hand: Secondary | ICD-10-CM

## 2021-03-04 DIAGNOSIS — E790 Hyperuricemia without signs of inflammatory arthritis and tophaceous disease: Secondary | ICD-10-CM | POA: Diagnosis not present

## 2021-03-04 DIAGNOSIS — K219 Gastro-esophageal reflux disease without esophagitis: Secondary | ICD-10-CM

## 2021-03-04 DIAGNOSIS — E119 Type 2 diabetes mellitus without complications: Secondary | ICD-10-CM | POA: Diagnosis not present

## 2021-03-04 DIAGNOSIS — I10 Essential (primary) hypertension: Secondary | ICD-10-CM

## 2021-03-04 DIAGNOSIS — M1A09X0 Idiopathic chronic gout, multiple sites, without tophus (tophi): Secondary | ICD-10-CM

## 2021-03-04 DIAGNOSIS — Z905 Acquired absence of kidney: Secondary | ICD-10-CM

## 2021-03-04 DIAGNOSIS — M79642 Pain in left hand: Secondary | ICD-10-CM

## 2021-03-04 DIAGNOSIS — M19071 Primary osteoarthritis, right ankle and foot: Secondary | ICD-10-CM

## 2021-03-04 DIAGNOSIS — Z5181 Encounter for therapeutic drug level monitoring: Secondary | ICD-10-CM | POA: Diagnosis not present

## 2021-03-04 DIAGNOSIS — Z87891 Personal history of nicotine dependence: Secondary | ICD-10-CM

## 2021-03-04 DIAGNOSIS — I358 Other nonrheumatic aortic valve disorders: Secondary | ICD-10-CM

## 2021-03-04 NOTE — Progress Notes (Signed)
Office Visit Note  Patient: Jaclyn Brooks             Date of Birth: 1961-09-19           MRN: [id number]             PCP: Lindell Spar, MD Referring: Corena Herter, PA-C Visit Date: 03/04/2021 Occupation: @GUAROCC @  Subjective:  New Patient (Initial Visit) (Joint pain and stiffness, right ankle, right knee, right hand pain, left elbow pain radiating to hand, all symptoms began after COVID booster)   History of Present Illness: Jaclyn Brooks is a 60 y.o. female is seen in consultation per request of her PCP.  According the patient her symptoms a started at age 55 with left ankle joint pain and swelling after she twisted her left ankle.  She states was wrapped and the symptoms improved.  Between 2007 and 2016 she had several episodes of bilateral ankle joint swelling intermittently for which she was mostly given pain medications.  She states she has been seeing Dr. Marlou Sa for the last 2 years who felt that she had gout.  He is given her a cortisone injection in the left ankle joint in the past and he also evaluated her for the left elbow joint pain and discussed possible gout medications.  She states in February she had a COVID-19 vaccination after that she started having increased pain in multiple joints.  She states she has been having pain and discomfort in her left elbow, left index finger, right thumb, both ankles, right knee and her right great toe.  She was seen in the emergency room last week due to severe pain and swelling in her right knee and right ankle  to the point that she was having difficulty walking.  She was given intramuscular steroid injection and a prednisone taper.  She states the pain has eased off but she still have pain and swelling in her right knee joint.  Activities of Daily Living:  Patient reports morning stiffness for 5 minutes.   Patient Denies nocturnal pain.  Difficulty dressing/grooming: Reports Difficulty climbing stairs: Reports Difficulty getting out  of chair: Reports Difficulty using hands for taps, buttons, cutlery, and/or writing: Reports  Review of Systems  Constitutional: Positive for fatigue. Negative for night sweats, weight gain and weight loss.  HENT: Positive for mouth dryness. Negative for mouth sores, trouble swallowing, trouble swallowing and nose dryness.   Eyes: Positive for dryness. Negative for pain, redness and visual disturbance.  Respiratory: Positive for shortness of breath. Negative for cough and difficulty breathing.        COPD  Cardiovascular: Positive for swelling in legs/feet. Negative for chest pain, palpitations, hypertension and irregular heartbeat.  Gastrointestinal: Positive for constipation and diarrhea. Negative for blood in stool.  Endocrine: Positive for cold intolerance, heat intolerance and excessive thirst. Negative for increased urination.  Genitourinary: Negative for painful urination and vaginal dryness.  Musculoskeletal: Positive for arthralgias, gait problem, joint pain, joint swelling, muscle weakness and morning stiffness. Negative for myalgias, muscle tenderness and myalgias.  Skin: Positive for rash. Negative for color change, hair loss, skin tightness, ulcers and sensitivity to sunlight.  Allergic/Immunologic: Negative for susceptible to infections.  Neurological: Negative for dizziness, memory loss, night sweats and weakness.  Hematological: Negative for bruising/bleeding tendency and swollen glands.  Psychiatric/Behavioral: Positive for depressed mood and sleep disturbance. The patient is nervous/anxious.     PMFS History:  Patient Active Problem List   Diagnosis Date Noted  .  Encounter to establish care 11/27/2020  . Abdominal pain 07/16/2020  . Diarrhea 07/16/2020  . Nausea without vomiting 07/16/2020  . Diabetes mellitus without complication ([place]) 55/73/2202  . 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 GERD (gastroesophageal reflux disease) 05/31/2019  . Solitary  kidney, acquired 05/10/2019  . Carotid arterial disease ([place]) 12/11/2018  . COPD, mild ([place]) 10/01/2018  . CKD (chronic kidney disease) stage 3, GFR 30-59 ml/min (HCC) 10/01/2018  . Aortic valve sclerosis 10/01/2018  . Hypertension 09/11/2018    Past Medical History:  Diagnosis Date  . Allergy   . Asthma    Phreesia 11/25/2020  . Chronic kidney disease    Phreesia 11/25/2020  . Depression   . Gout   . Hyperlipidemia   . Hypertension   . 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 Hypertension Mother   . Heart disease Mother        Cardiac Arrest   . Hypertension Father   . Cancer Father   . Hypertension Sister   . Cancer Brother        Bladder Cancer   . Hypertension Brother   . Hypertension Sister    Past Surgical History:  Procedure Laterality Date  . ABDOMINAL HYSTERECTOMY    . CHOLECYSTECTOMY N/A 01/28/2015   Procedure: LAPAROSCOPIC CHOLECYSTECTOMY;  Surgeon: Aviva Signs Md, MD;  Location: [facility];  Service: General;  Laterality: N/A;  . excision of bone spurs Bilateral    Big toes  . renal aneurysm Right    right coil-and only has function of left kidney  . X-STOP IMPLANTATION     Social History   Social History Narrative  . Not on file   Immunization History  Administered Date(s) Administered  . Moderna Sars-Covid-2 Vaccination 12/27/2019, 01/28/2020, 09/13/2020     Objective: Vital Signs: BP (!) 158/76 (BP Location: Right Arm, Patient Position: Sitting, Cuff Size: Normal)   Pulse (!) 44   Resp 15   Ht 5' 5.5" (1.664 m)   Wt 174 lb (78.9 kg)   BMI 28.51 kg/m    Physical Exam Vitals and nursing note reviewed.  Constitutional:      Appearance: She is well-developed.  HENT:     Head: Normocephalic and atraumatic.  Eyes:     Conjunctiva/sclera: Conjunctivae normal.  Cardiovascular:     Rate and Rhythm: Normal rate and regular rhythm.     Heart sounds: Normal heart sounds.  Pulmonary:     Effort:  Pulmonary effort is normal.     Breath sounds: Normal breath sounds.  Abdominal:     General: Bowel sounds are normal.     Palpations: Abdomen is soft.  Musculoskeletal:     Cervical back: Normal range of motion.  Lymphadenopathy:     Cervical: No cervical adenopathy.  Skin:    General: Skin is warm and dry.     Capillary Refill: Capillary refill takes less than 2 seconds.  Neurological:     Mental Status: She is alert and oriented to person, place, and time.  Psychiatric:        Behavior: Behavior normal.      Musculoskeletal Exam: C-spine thoracic and lumbar spine with good range of motion.  Shoulder joints, elbow joints, wrist joints with good range of motion.  She had bilateral CMC and DIP thickening but no synovitis was noted.  She has hyperpigmentation over her left elbow but no warmth swelling or effusion was noted.  She had effusion in her right knee joint without any warmth.  Left knee joint was in good range of motion without any warmth or swelling.  She had no tenderness over ankles.  Bunionectomy scars were noted over bilateral first MTP joints.  CDAI Exam: CDAI Score: -- Patient Global: --; Provider Global: -- Swollen: --; Tender: -- Joint Exam 03/04/2021   No joint exam has been documented for this visit   There is currently no information documented on the homunculus. Go to the Rheumatology activity and complete the homunculus joint exam.  Investigation: No additional findings.  Imaging: DG Knee 2 Views Right  Result Date: 03/01/2021 CLINICAL DATA:  Right knee and ankle pain.  No known injury. EXAM: RIGHT KNEE - 1-2 VIEW; RIGHT ANKLE - COMPLETE 3+ VIEW COMPARISON:  None. FINDINGS: Right ankle: Mild soft tissue swelling overlying the medial malleolus. No fracture or dislocation. Right knee: Mild spurring of the medial, lateral, and patellofemoral compartments. Small suprapatellar knee joint effusion. Small rounded os ossific density in the posterior knee joint  suspicious for a loose body. IMPRESSION: 1. Mild soft tissue swelling overlying the medial malleolus without underlying acute fracture or dislocation. 2. Small knee joint effusion.  No fracture or dislocation. Electronically Signed   By: Miachel Roux M.D.   On: 03/01/2021 12:56   DG Ankle Complete Right  Result Date: 03/01/2021 CLINICAL DATA:  Right knee and ankle pain.  No known injury. EXAM: RIGHT KNEE - 1-2 VIEW; RIGHT ANKLE - COMPLETE 3+ VIEW COMPARISON:  None. FINDINGS: Right ankle: Mild soft tissue swelling overlying the medial malleolus. No fracture or dislocation. Right knee: Mild spurring of the medial, lateral, and patellofemoral compartments. Small suprapatellar knee joint effusion. Small rounded os ossific density in the posterior knee joint suspicious for a loose body. IMPRESSION: 1. Mild soft tissue swelling overlying the medial malleolus without underlying acute fracture or dislocation. 2. Small knee joint effusion.  No fracture or dislocation. Electronically Signed   By: Miachel Roux M.D.   On: 03/01/2021 12:56   XR Foot 2 Views Left  Result Date: 03/04/2021 Severe first MTP narrowing with erosive change was noted.  PIP and DIP narrowing was noted.  No intertarsal, tibiotalar or subtalar joint space narrowing was noted.  Inferior calcaneal spur was noted. Impression: These findings are consistent with osteoarthritis and most likely gouty arthropathy.  XR Hand 2 View Left  Result Date: 03/04/2021 Severe CMC narrowing and subluxation was noted.  PIP and DIP narrowing was noted.  No MCP, intercarpal or radiocarpal joint space narrowing was noted.  No erosive changes were noted. 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  XR Hand 2 View Right  Result Date: 03/04/2021 Severe CMC narrowing and subluxation was noted.  PIP and DIP narrowing was noted.  No MCP, intercarpal or radiocarpal joint space narrowing was noted.  No erosive changes were noted.  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   Recent Labs: Lab Results  Component Value Date   WBC 8.4 01/15/2021   HGB 13.3 01/15/2021   PLT 313 01/15/2021   NA 142 07/20/2020   K 4.3 07/20/2020   CL 108 07/20/2020   CO2 28 07/20/2020   GLUCOSE 125 (H) 07/20/2020   BUN 29 (H) 07/20/2020   CREATININE 1.49 (H) 07/20/2020   BILITOT 0.4 07/20/2020   ALKPHOS 60 01/26/2015   AST 16 07/20/2020   ALT 21 07/20/2020   PROT 6.7 07/20/2020   ALBUMIN  4.0 01/26/2015   CALCIUM 9.5 07/20/2020   GFRAA 43 (L) 07/20/2020  January 15, 2021 uric acid 10.4  Speciality Comments: No specialty comments available.  Procedures:  No procedures performed Allergies: Bee venom, Nsaids, and Shellfish allergy   Assessment / Plan:     Visit Diagnoses: Idiopathic chronic gout of multiple sites without tophus-patient gives history of frequent gout flares for the last several years.  She has never been treated for gout.  She usually received pain medications.  She had recent flare with pain and swelling in her right knee joint and right ankle joint.  She was given intramuscular cortisone and a prednisone taper.  She has some effusion in her right knee joint but no warmth was noted.  Detailed counseling regarding diet was provided.  Dietary modifications were discussed.  I will start her on colchicine 0.6 mg p.o. daily.  She will stay on colchicine.  Allopurinol will be added at 50 mg p.o. daily which will be increased to 100 mg p.o. daily after 1 week and 200 mg p.o. daily after second week.  We will check CBC, CMP with GFR and uric acid in 1 month.  Hyperuricemia-uric acid has been persistently elevated.  Last value from April 8 2022nd is 10.4.  Pain in both hands -she complains of pain and stiffness in her hands.  X-ray findings are consistent with osteoarthritis.  Plan: XR Hand 2 View Right, XR Hand 2 View Left.  X-ray showed bilateral severe CMC narrowing and subluxation.   Osteoarthritic changes were noted in bilateral hands.  Primary osteoarthritis of right foot-she has known history of osteoarthritis in her right foot.  She had bunionectomy.  Pain in left foot - Plan: XR Foot 2 Views Left, x-rays showed severe first MTP narrowing and erosive change.  X-rays are consistent with osteoarthritis and possible gouty arthropathy.  Rheumatoid factor, Cyclic citrul peptide antibody, IgG.  Medication monitoring encounter - Plan: COMPLETE METABOLIC PANEL WITH GFR  Diabetes mellitus without complication (HCC)-patient states her diabetes is well controlled.  She is followed by her PCP.  Gastroesophageal reflux disease without esophagitis  COPD, mild (HCC)-she is a former smoker.  She still have some shortness of breath on exertion.  Primary hypertension-her systolic blood pressure was elevated today.  Diastolic was normal.  Bilateral carotid artery occlusion  Aortic valve sclerosis  Stage 3b chronic kidney disease ([place]) - She is followed by Dr. Justin Mend at [facility].  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  Former smoker - 11/2 PPDX20 years. Quit smoking 2017.  Orders: Orders Placed This Encounter  Procedures  . XR Foot 2 Views Left  . XR Hand 2 View Right  . XR Hand 2 View Left  . COMPLETE METABOLIC PANEL WITH GFR  . Rheumatoid factor  . Cyclic citrul peptide antibody, IgG   No orders of the defined types were placed in this encounter.   Face-to-face time spent with patient was 45 minutes. Greater than 50% of time was spent in counseling and coordination of care.  Follow-Up Instructions: Return in about 4 weeks (around 04/01/2021) for Gout.   Bo Merino, MD  Note - This record has been created using Editor, commissioning.  Chart creation errors have been sought, but may not always  have been located. Such creation errors do not reflect on  the standard of medical care.

## 2021-03-04 NOTE — Progress Notes (Signed)
[name] is seen by pharmacist for counseling on colchicine and allopurinol at [facility]. She was seen in ED on 03/01/21 d/t right knee and ankle pain. PMH significant for CKD stage 3, T2DM, GERD, HTN, aortic valve sclerosis. She reported that inflammation was moving around small joints, thus referral to rheumatology was placed. She was treated with Solu-Medrol in the ED and discharged home.  Objective:  Uric acid: 10.4 on 01/15/21  CMP Latest Ref Rng & Units 07/20/2020 07/16/2020 07/14/2020  Glucose 65 - 99 mg/dL 125(H) 103(H) 89  BUN 7 - 25 mg/dL 29(H) 50(H) 50(H)  Creatinine 0.50 - 0.99 mg/dL 1.49(H) 2.82(H) 2.22(H)  Sodium 135 - 146 mmol/L 142 136 136  Potassium 3.5 - 5.3 mmol/L 4.3 4.1 4.1  Chloride 98 - 110 mmol/L 108 102 103  CO2 20 - 32 mmol/L 28 21 24   Calcium 8.6 - 10.4 mg/dL 9.5 9.6 10.1  Total Protein 6.1 - 8.1 g/dL 6.7 - 7.6  Total Bilirubin 0.2 - 1.2 mg/dL 0.4 - 0.4  Alkaline Phos 39 - 117 U/L - - -  AST 10 - 35 U/L 16 - 17  ALT 6 - 29 U/L 21 - 43(H)   Last GFR per our record was drawn in October 2021 and GFR was 43 at that visit  Plan: - Start allopurinol 50mg  once daily x 1 week, then increase to allopurinol 100mg  once daily x 1 week, then increase to allopurinol 200mg  once daily. Low-dose based on renal function.  - Start colchicine 0.6mg  once daily THREE DAYS AFTER starting allopurinol.  - Will re-check uric acid and CMP in 1 month. Patient would like all labs forwarded to her nephrologist at [facility]. She has been advised to notify us if he labs

## 2021-03-04 NOTE — Patient Instructions (Addendum)
Take allopurinol 50mg  once daily for 1 week, then 100mg  once daily for 1 week, then 200mg  once daily thereafter.  Start colchicine 0.6mg  once daily 3 days after starting allopurinol  Gout  Gout is painful swelling of your joints. Gout is a type of arthritis. It is caused by having too much uric acid in your body. Uric acid is a chemical that is made when your body breaks down substances called purines. If your body has too much uric acid, sharp crystals can form and build up in your joints. This causes pain and swelling. Gout attacks can happen quickly and be very painful (acute gout). Over time, the attacks can affect more joints and happen more often (chronic gout). What are the causes?  Too much uric acid in your blood. This can happen because: ? Your kidneys do not remove enough uric acid from your blood. ? Your body makes too much uric acid. ? You eat too many foods that are high in purines. These foods include organ meats, some seafood, and beer.  Trauma or stress. What increases the risk?  Having a family history of gout.  Being female and middle-aged.  Being female and having gone through menopause.  Being very overweight (obese).  Drinking alcohol, especially beer.  Not having enough water in the body (being dehydrated).  Losing weight too quickly.  Having an organ transplant.  Having lead poisoning.  Taking certain medicines.  Having kidney disease.  Having a skin condition called psoriasis. What are the signs or symptoms? An attack of acute gout usually happens in just one joint. The most common place is the big toe. Attacks often start at night. Other joints that may be affected include joints of the feet, ankle, knee, fingers, wrist, or elbow. Symptoms of an attack may include:  Very bad pain.  Warmth.  Swelling.  Stiffness.  Shiny, red, or purple skin.  Tenderness. The affected joint may be very painful to touch.  Chills and fever. Chronic gout may  cause symptoms more often. More joints may be involved. You may also have white or yellow lumps (tophi) on your hands or feet or in other areas near your joints.   How is this treated?  Treatment for this condition has two phases: treating an acute attack and preventing future attacks.  Acute gout treatment may include: ? NSAIDs. ? Steroids. These are taken by mouth or injected into a joint. ? Colchicine. This medicine relieves pain and swelling. It can be given by mouth or through an IV tube.  Preventive treatment may include: ? Taking small doses of NSAIDs or colchicine daily. ? Using a medicine that reduces uric acid levels in your blood. ? Making changes to your diet. You may need to see a food expert (dietitian) about what to eat and drink to prevent gout. Follow these instructions at home: During a gout attack  If told, put ice on the painful area: ? Put ice in a plastic bag. ? Place a towel between your skin and the bag. ? Leave the ice on for 20 minutes, 2-3 times a day.  Raise (elevate) the painful joint above the level of your heart as often as you can.  Rest the joint as much as possible. If the joint is in your leg, you may be given crutches.  Follow instructions from your doctor about what you cannot eat or drink.   Avoiding future gout attacks  Eat a low-purine diet. Avoid foods and drinks such as: ?  Liver. ? Kidney. ? Anchovies. ? Asparagus. ? Herring. ? Mushrooms. ? Mussels. ? Beer.  Stay at a healthy weight. If you want to lose weight, talk with your doctor. Do not lose weight too fast.  Start or continue an exercise plan as told by your doctor. Eating and drinking  Drink enough fluids to keep your pee (urine) pale yellow.  If you drink alcohol: ? Limit how much you use to:  0-1 drink a day for women.  0-2 drinks a day for men. ? Be aware of how much alcohol is in your drink. In the [place] one drink equals one 12 oz bottle of beer (355 mL), one 5 oz  glass of wine (148 mL), or one 1 oz glass of hard liquor (44 mL). General instructions  Take over-the-counter and prescription medicines only as told by your doctor.  Do not drive or use heavy machinery while taking prescription pain medicine.  Return to your normal activities as told by your doctor. Ask your doctor what activities are safe for you.  Keep all follow-up visits as told by your doctor. This is important. Contact a doctor if:  You have another gout attack.  You still have symptoms of a gout attack after 10 days of treatment.  You have problems (side effects) because of your medicines.  You have chills or a fever.  You have burning pain when you pee (urinate).  You have pain in your lower back or belly. Get help right away if:  You have very bad pain.  Your pain cannot be controlled.  You cannot pee. Summary  Gout is painful swelling of the joints.  The most common site of pain is the big toe, but it can affect other joints.  Medicines and avoiding some foods can help to prevent and treat gout attacks. This information is not intended to replace advice given to you by your health care provider. Make sure you discuss any questions you have with your health care provider. Document Revised: 04/18/2018 Document Reviewed: 04/18/2018 Elsevier Patient Education  [employer].

## 2021-03-05 ENCOUNTER — Telehealth: Payer: Self-pay | Admitting: *Deleted

## 2021-03-05 LAB — COMPLETE METABOLIC PANEL WITH GFR
AG Ratio: 0.9 (calc) — ABNORMAL LOW (ref 1.0–2.5)
ALT: 30 U/L — ABNORMAL HIGH (ref 6–29)
Albumin: 3.9 g/dL (ref 3.6–5.1)
Alkaline phosphatase (APISO): 63 U/L (ref 37–153)
BUN/Creatinine Ratio: 24 (calc) — ABNORMAL HIGH (ref 6–22)
BUN: 32 mg/dL — ABNORMAL HIGH (ref 7–25)
CO2: 24 mmol/L (ref 20–32)
Calcium: 10.5 mg/dL — ABNORMAL HIGH (ref 8.6–10.4)
Chloride: 101 mmol/L (ref 98–110)
Creat: 1.34 mg/dL — ABNORMAL HIGH (ref 0.50–0.99)
GFR, Est African American: 49 mL/min/{1.73_m2} — ABNORMAL LOW (ref >=60)
GFR, Est Non African American: 43 mL/min/{1.73_m2} — ABNORMAL LOW (ref >=60)
Globulin: 4.2 g/dL (calc) — ABNORMAL HIGH (ref 1.9–3.7)
Glucose, Bld: 132 mg/dL — ABNORMAL HIGH (ref 65–99)
Sodium: 137 mmol/L (ref 135–146)
Total Protein: 8.1 g/dL (ref 6.1–8.1)

## 2021-03-05 LAB — CYCLIC CITRUL PEPTIDE ANTIBODY, IGG: Cyclic Citrullin Peptide Ab: <16 UNITS

## 2021-03-05 LAB — RHEUMATOID FACTOR: Rheumatoid fact SerPl-aCnc: <14 IU/mL (ref <14)

## 2021-03-05 NOTE — Telephone Encounter (Addendum)
Fax labs to [facility] when resulted, per patient's request. Thank you.

## 2021-03-05 NOTE — Progress Notes (Signed)
Creatinine is elevated and is stable.  Calcium is elevated.  Please avoid calcium supplement.  Liver function is mildly elevated.  Please forward labs to her PCP.  Tests for rheumatoid arthritis are negative.

## 2021-03-12 ENCOUNTER — Encounter: Payer: Self-pay | Admitting: Surgical

## 2021-03-12 ENCOUNTER — Ambulatory Visit (INDEPENDENT_AMBULATORY_CARE_PROVIDER_SITE_OTHER): Payer: Medicare Other | Admitting: Surgical

## 2021-03-12 DIAGNOSIS — G8929 Other chronic pain: Secondary | ICD-10-CM

## 2021-03-12 DIAGNOSIS — M109 Gout, unspecified: Secondary | ICD-10-CM

## 2021-03-12 DIAGNOSIS — M25561 Pain in right knee: Secondary | ICD-10-CM

## 2021-03-12 DIAGNOSIS — M25572 Pain in left ankle and joints of left foot: Secondary | ICD-10-CM | POA: Diagnosis not present

## 2021-03-13 LAB — SYNOVIAL FLUID ANALYSIS, COMPLETE
Basophils, %: 0 %
Eosinophils-Synovial: 0 % (ref 0–2)
Lymphocytes-Synovial Fld: 2 % (ref 0–74)
Monocyte/Macrophage: 16 % (ref 0–69)
Neutrophil, Synovial: 82 % — ABNORMAL HIGH (ref 0–24)
Synoviocytes, %: 0 % (ref 0–15)
WBC, Synovial: 25440 cells/uL — ABNORMAL HIGH (ref <150)

## 2021-03-13 LAB — TIQ-NTM

## 2021-03-20 ENCOUNTER — Encounter: Payer: Self-pay | Admitting: Surgical

## 2021-03-20 DIAGNOSIS — M109 Gout, unspecified: Secondary | ICD-10-CM

## 2021-03-20 DIAGNOSIS — M25561 Pain in right knee: Secondary | ICD-10-CM

## 2021-03-20 MED ORDER — BUPIVACAINE HCL 0.25 % IJ SOLN
4.0000 mL | INTRAMUSCULAR | Status: AC | PRN
  Administered 2021-03-20: 4 mL via INTRA_ARTICULAR

## 2021-03-20 MED ORDER — LIDOCAINE HCL 1 % IJ SOLN
5.0000 mL | INTRAMUSCULAR | Status: AC | PRN
Start: 2021-03-20 — End: 2021-03-20
  Administered 2021-03-20: 5 mL

## 2021-03-20 MED ORDER — METHYLPREDNISOLONE ACETATE 40 MG/ML IJ SUSP
40.0000 mg | INTRAMUSCULAR | Status: AC | PRN
Start: 2021-03-20 — End: 2021-03-20
  Administered 2021-03-20: 40 mg via INTRA_ARTICULAR

## 2021-03-20 MED ORDER — METHYLPREDNISOLONE ACETATE 40 MG/ML IJ SUSP
40.0000 mg | INTRAMUSCULAR | Status: AC | PRN
  Administered 2021-03-20: 40 mg via INTRA_ARTICULAR

## 2021-03-20 MED ORDER — LIDOCAINE HCL 1 % IJ SOLN
3.0000 mL | INTRAMUSCULAR | Status: AC | PRN
  Administered 2021-03-20: 3 mL

## 2021-03-20 MED ORDER — BUPIVACAINE HCL 0.5 % IJ SOLN
1.0000 mL | INTRAMUSCULAR | Status: AC | PRN
  Administered 2021-03-20: 1 mL via INTRA_ARTICULAR

## 2021-03-20 NOTE — Progress Notes (Signed)
Office Visit Note   Patient: Jaclyn Brooks           Date of Birth: 06-Apr-1961           MRN: [id number] Visit Date: 03/12/2021 Requested by: Lindell Spar, MD [address] [place],  [place] [address] PCP: Lindell Spar, MD  Subjective: Chief Complaint  Patient presents with   Right Knee - Edema, Pain   Right Ankle - Pain    HPI: Jaclyn Brooks is a 60 y.o. female who presents to the office complaining of right knee and right ankle pain and swelling.  She has had 1 month of pain in both joints.  She has history of suspected gout flare in her elbow that she was seen by Dr. Marlou Sa for.  This elbow has returned to normal.  She has history of elevated uric acid and was seen by Dr Estanislado Pandy who plan to proceed with allopurinol prescription to help stop patient's acute gout flares in the future.  Patient requests injection in her joints.  She denies any fevers, chills, night sweats, malaise.  She does have history of CKD so she cannot take NSAIDs.  She denies any history of diabetes..                ROS: All systems reviewed are negative as they relate to the chief complaint within the history of present illness.  Patient denies fevers or chills.  Assessment & Plan: Visit Diagnoses:  1. Acute gout of right knee, unspecified cause   2. Chronic pain of right knee   3. Acute gout of right ankle, unspecified cause     Plan: Patient is a 60 year old female who presents complaining of right knee and right ankle pain.  She reports swelling in her right knee and right ankle over the last month.  She does have history of suspected gout with elevated uric acid and similar symptoms in her elbow that she was seen by Dr. Marlou Sa earlier this year for.  Right knee was aspirated with about 25 cc of inflammatory synovial fluid obtained.  This was sent for analysis and came back positive for monosodium urate crystals.  Additionally, the right ankle joint was aspirated of about 2 cc of inflammatory synovial  fluid and a right ankle joint was administered as well.  Patient tolerated both procedures well.  About 5 days following these injections, patient was contacted via telephone and reported almost 100% improvement of her knee and ankle pain.  She was informed that synovial fluid analysis showed she did have gout.  Additionally, she was informed that Dr Estanislado Pandy intended for her to take allopurinol but it does not seem that a prescription was ever sent in for her.  Recommended that she contact their office to obtain allopurinol prescription.  She understands and will decide whether or not she wants to call them.  Follow-up as needed  Follow-Up Instructions: No follow-ups on file.   Orders:  Orders Placed This Encounter  Procedures   Cell count + diff,  w/ cryst-synvl fld   Synovial Fluid Analysis, Complete   TIQ-NTM   No orders of the defined types were placed in this encounter.     Procedures: Large Joint Inj: R knee on 03/20/2021 3:21 PM Indications: diagnostic evaluation, joint swelling and pain Details: 18 G 1.5 in needle, superolateral approach  Arthrogram: No  Medications: 5 mL lidocaine 1 %; 40 mg methylPREDNISolone acetate 40 MG/ML; 4 mL bupivacaine 0.25 % Aspirate: 25  mL yellow Outcome: tolerated well, no immediate complications Procedure, treatment alternatives, risks and benefits explained, specific risks discussed. Consent was given by the patient. Immediately prior to procedure a time out was called to verify the correct patient, procedure, equipment, support staff and site/side marked as required. Patient was prepped and draped in the usual sterile fashion.    Medium Joint Inj on 03/20/2021 3:21 PM Indications: pain, joint swelling and diagnostic evaluation Details: 22 G 1.5 in needle, fluoroscopy-guided anteromedial approach Medications: 3 mL lidocaine 1 %; 1 mL bupivacaine 0.5 %; 40 mg methylPREDNISolone acetate 40 MG/ML (1/2 mL bupivacaine 0.5 %) Aspirate: 2 mL  yellow Outcome: tolerated well, no immediate complications Procedure, treatment alternatives, risks and benefits explained, specific risks discussed. Consent was given by the patient. Immediately prior to procedure a time out was called to verify the correct patient, procedure, equipment, support staff and site/side marked as required. Patient was prepped and draped in the usual sterile fashion.      Clinical Data: No additional findings.  Objective: Vital Signs: There were no vitals taken for this visit.  Physical Exam:  Constitutional: Patient appears well-developed HEENT:  Head: Normocephalic Eyes:EOM are normal Neck: Normal range of motion Cardiovascular: Normal rate Pulmonary/chest: Effort normal Neurologic: Patient is alert Skin: Skin is warm Psychiatric: Patient has normal mood and affect  Ortho Exam: Ortho exam demonstrates right knee with large effusion.  0 degrees of knee extension and 105 degrees of knee flexion.  Warmth over the right knee.  Tenderness over the medial lateral joint lines and diffusely throughout the knee.  No calf tenderness.  Negative Homans' sign.  Right ankle with tenderness over the ankle joint line and with pain with passive dorsiflexion and plantarflexion.  No ecchymosis or deformity noted.  Specialty Comments:  No specialty comments available.  Imaging: No results found.   PMFS History: Patient Active Problem List   Diagnosis Date Noted   Encounter to establish care 11/27/2020   Abdominal pain 07/16/2020   Diarrhea 07/16/2020   Nausea without vomiting 07/16/2020   Diabetes mellitus without complication ([facility]) 67/67/2094   Primary osteoarthritis of right foot 05/12/2020   GERD (gastroesophageal reflux disease) 05/31/2019   Solitary kidney, acquired 05/10/2019   Carotid arterial disease ([place]) 12/11/2018   COPD, mild ([facility] [place]) 10/01/2018   CKD (chronic kidney disease) stage 3, GFR 30-59 ml/min (HCC) 10/01/2018   Aortic valve sclerosis  10/01/2018   Hypertension 09/11/2018   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[facility])     Family History  Problem Relation Age of Onset   Hypertension Mother    Heart disease Mother        Cardiac Arrest    Hypertension Father    Cancer Father    Hypertension Sister    Cancer Brother        Bladder Cancer    Hypertension Brother    Hypertension Sister     Past Surgical History:  Procedure Laterality Date   ABDOMINAL HYSTERECTOMY     CHOLECYSTECTOMY N/A 01/28/2015   Procedure: LAPAROSCOPIC CHOLECYSTECTOMY;  Surgeon: Aviva Signs Md, MD;  Location: [facility];  Service: General;  Laterality: N/A;   excision of bone spurs Bilateral    Big toes   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

## 2021-03-24 NOTE — Progress Notes (Deleted)
Office Visit Note  Patient: Jaclyn Brooks             Date of Birth: 05-22-1961           MRN: [id number]             PCP: Lindell Spar, MD Referring: Lindell Spar, MD Visit Date: 04/01/2021 Occupation: @GUAROCC @  Subjective:  No chief complaint on file.   History of Present Illness: Jaclyn Brooks is a 60 y.o. female ***   Activities of Daily Living:  Patient reports morning stiffness for *** {minute/hour:19697}.   Patient {ACTIONS;DENIES/REPORTS:21021675::"Denies"} nocturnal pain.  Difficulty dressing/grooming: {ACTIONS;DENIES/REPORTS:21021675::"Denies"} Difficulty climbing stairs: {ACTIONS;DENIES/REPORTS:21021675::"Denies"} Difficulty getting out of chair: {ACTIONS;DENIES/REPORTS:21021675::"Denies"} Difficulty using hands for taps, buttons, cutlery, and/or writing: {ACTIONS;DENIES/REPORTS:21021675::"Denies"}  No Rheumatology ROS completed.   PMFS History:  Patient Active Problem List   Diagnosis Date Noted   Encounter to establish care 11/27/2020   Abdominal pain 07/16/2020   Diarrhea 07/16/2020   Nausea without vomiting 07/16/2020   Diabetes mellitus without complication ([facility]) 58/85/0277   Primary osteoarthritis of right foot 05/12/2020   GERD (gastroesophageal reflux disease) 05/31/2019   Solitary kidney, acquired 05/10/2019   Carotid arterial disease ([facility]) 12/11/2018   COPD, mild ([facility]) 10/01/2018   CKD (chronic kidney disease) stage 3, GFR 30-59 ml/min (HCC) 10/01/2018   Aortic valve sclerosis 10/01/2018   Hypertension 09/11/2018    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[place] [facility])     Family History  Problem Relation Age of Onset   Hypertension Mother    Heart disease Mother        Cardiac Arrest    Hypertension Father    Cancer Father    Hypertension Sister    Cancer Brother         Bladder Cancer    Hypertension Brother    Hypertension Sister    Past Surgical History:  Procedure Laterality Date   ABDOMINAL HYSTERECTOMY     CHOLECYSTECTOMY N/A 01/28/2015   Procedure: LAPAROSCOPIC CHOLECYSTECTOMY;  Surgeon: Aviva Signs Md, MD;  Location: [facility];  Service: General;  Laterality: N/A;   excision of bone spurs Bilateral    Big toes   renal aneurysm Right    right coil-and only has function of left kidney   X-STOP IMPLANTATION     Social History   Social History Narrative   Not on file   Immunization History  Administered Date(s) Administered   Moderna Sars-Covid-2 Vaccination 12/27/2019, 01/28/2020, 09/13/2020     Objective: Vital Signs: There were no vitals taken for this visit.   Physical Exam   Musculoskeletal Exam:   CDAI Exam: CDAI Score: -- Patient Global: --; Provider Global: -- Swollen: --; Tender: -- Joint Exam 04/01/2021   No joint exam has been documented for this visit   There is currently no information documented on the homunculus. Go to the Rheumatology activity and complete the homunculus joint exam.  Investigation: No additional findings.  Imaging: DG Knee 2 Views Right  Result Date: 03/01/2021 CLINICAL DATA:  Right knee and ankle pain.  No known injury. EXAM: RIGHT KNEE - 1-2 VIEW; RIGHT ANKLE - COMPLETE 3+ VIEW COMPARISON:  None. FINDINGS: Right ankle: Mild soft tissue swelling overlying the medial malleolus. No fracture or  dislocation. Right knee: Mild spurring of the medial, lateral, and patellofemoral compartments. Small suprapatellar knee joint effusion. Small rounded os ossific density in the posterior knee joint suspicious for a loose body. IMPRESSION: 1. Mild soft tissue swelling overlying the medial malleolus without underlying acute fracture or dislocation. 2. Small knee joint effusion.  No fracture or dislocation. Electronically Signed   By: Miachel Roux M.D.   On: 03/01/2021 12:56   DG Ankle Complete  Right  Result Date: 03/01/2021 CLINICAL DATA:  Right knee and ankle pain.  No known injury. EXAM: RIGHT KNEE - 1-2 VIEW; RIGHT ANKLE - COMPLETE 3+ VIEW COMPARISON:  None. FINDINGS: Right ankle: Mild soft tissue swelling overlying the medial malleolus. No fracture or dislocation. Right knee: Mild spurring of the medial, lateral, and patellofemoral compartments. Small suprapatellar knee joint effusion. Small rounded os ossific density in the posterior knee joint suspicious for a loose body. IMPRESSION: 1. Mild soft tissue swelling overlying the medial malleolus without underlying acute fracture or dislocation. 2. Small knee joint effusion.  No fracture or dislocation. Electronically Signed   By: Miachel Roux M.D.   On: 03/01/2021 12:56   XR Foot 2 Views Left  Result Date: 03/04/2021 Severe first MTP narrowing with erosive change was noted.  PIP and DIP narrowing was noted.  No intertarsal, tibiotalar or subtalar joint space narrowing was noted.  Inferior calcaneal spur was noted. Impression: These findings are consistent with osteoarthritis and most likely gouty arthropathy.  XR Hand 2 View Left  Result Date: 03/04/2021 Severe CMC narrowing and subluxation was noted.  PIP and DIP narrowing was noted.  No MCP, intercarpal or radiocarpal joint space narrowing was noted.  No erosive changes were noted. 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  XR Hand 2 View Right  Result Date: 03/04/2021 Severe CMC narrowing and subluxation was noted.  PIP and DIP narrowing was noted.  No MCP, intercarpal or radiocarpal joint space narrowing was noted.  No erosive changes were noted. 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   Recent Labs: Lab Results  Component Value Date   WBC 8.4 01/15/2021   HGB 13.3 01/15/2021   PLT 313 01/15/2021   NA 137 03/04/2021   K CANCELED 03/04/2021   CL 101 03/04/2021   CO2 24 03/04/2021   GLUCOSE 132  (H) 03/04/2021   BUN 32 (H) 03/04/2021   CREATININE 1.34 (H) 03/04/2021   BILITOT CANCELED 03/04/2021   ALKPHOS 60 01/26/2015   AST CANCELED 03/04/2021   ALT 30 (H) 03/04/2021   PROT 8.1 03/04/2021   ALBUMIN 4.0 01/26/2015   CALCIUM 10.5 (H) 03/04/2021   GFRAA 49 (L) 03/04/2021   Mar 04, 2021 RF negative, anti-CCP negative  March 12, 2021 synovial fluid showed WBC count 25,440, crystal examination was not performed.   Speciality Comments: No specialty comments available.  Procedures:  No procedures performed Allergies: Bee venom, Nsaids, and Shellfish allergy   Assessment / Plan:     Visit Diagnoses: No diagnosis found.  Orders: No orders of the defined types were placed in this encounter.  No orders of the defined types were placed in this encounter.   Face-to-face time spent with patient was *** minutes. Greater than 50% of time was spent in counseling and coordination of care.  Follow-Up Instructions: No follow-ups on file.   Bo Merino, MD  Note - This record has been created using Editor, commissioning.  Chart creation errors have been sought,  but may not always  have been located. Such creation errors do not reflect on  the standard of medical care.

## 2021-03-25 ENCOUNTER — Other Ambulatory Visit: Payer: Self-pay | Admitting: *Deleted

## 2021-03-25 ENCOUNTER — Other Ambulatory Visit: Payer: Self-pay

## 2021-03-25 ENCOUNTER — Encounter: Payer: Self-pay | Admitting: Internal Medicine

## 2021-03-25 ENCOUNTER — Ambulatory Visit (INDEPENDENT_AMBULATORY_CARE_PROVIDER_SITE_OTHER): Payer: Medicare Other | Admitting: Internal Medicine

## 2021-03-25 VITALS — BP 130/82 | HR 62 | Temp 98.6°F | Resp 18 | Ht 67.0 in | Wt 167.4 lb

## 2021-03-25 DIAGNOSIS — I6523 Occlusion and stenosis of bilateral carotid arteries: Secondary | ICD-10-CM

## 2021-03-25 DIAGNOSIS — N1832 Chronic kidney disease, stage 3b: Secondary | ICD-10-CM

## 2021-03-25 DIAGNOSIS — I10 Essential (primary) hypertension: Secondary | ICD-10-CM

## 2021-03-25 DIAGNOSIS — K219 Gastro-esophageal reflux disease without esophagitis: Secondary | ICD-10-CM | POA: Diagnosis not present

## 2021-03-25 DIAGNOSIS — J449 Chronic obstructive pulmonary disease, unspecified: Secondary | ICD-10-CM | POA: Diagnosis not present

## 2021-03-25 DIAGNOSIS — M109 Gout, unspecified: Secondary | ICD-10-CM | POA: Insufficient documentation

## 2021-03-25 DIAGNOSIS — M1A0690 Idiopathic chronic gout, unspecified knee, without tophus (tophi): Secondary | ICD-10-CM | POA: Diagnosis not present

## 2021-03-25 MED ORDER — ALLOPURINOL 100 MG PO TABS: 100.0000 mg | ORAL_TABLET | Freq: Every day | ORAL | 5 refills | Status: DC

## 2021-03-25 MED ORDER — ATENOLOL 50 MG PO TABS: 50.0000 mg | ORAL_TABLET | Freq: Every day | ORAL | 1 refills | Status: DC

## 2021-03-25 MED ORDER — CARTIA XT 240 MG PO CP24: 240.0000 mg | ORAL_CAPSULE | Freq: Every day | ORAL | 0 refills | Status: DC

## 2021-03-25 MED ORDER — CLOPIDOGREL BISULFATE 75 MG PO TABS: 75.0000 mg | ORAL_TABLET | Freq: Every evening | ORAL | 3 refills | Status: DC

## 2021-03-25 MED ORDER — DEXLANSOPRAZOLE 60 MG PO CPDR: 60.0000 mg | DELAYED_RELEASE_CAPSULE | Freq: Every day | ORAL | 3 refills | Status: DC

## 2021-03-25 MED ORDER — HYDROCHLOROTHIAZIDE 25 MG PO TABS: 25.0000 mg | ORAL_TABLET | Freq: Every day | ORAL | 1 refills | Status: DC

## 2021-03-25 NOTE — Progress Notes (Signed)
Established Patient Office Visit  Subjective:  Patient ID: Jaclyn Brooks, female    DOB: Dec 10, 1960  Age: 60 y.o. MRN: [id number]  CC:  Chief Complaint  Patient presents with   Follow-up    4 month follow up pt had fluid drawn off knee would like to discuss allopurinol for gout also sometimes feels like blood sugar is dropping to low     HPI Jaclyn Brooks is a 60 year old female with PMH of HTN, carotid artery stenosis, COPD, GERD, prediabetes, OA, CKD stage 3b and renal artery aneurysm who presents for follow up of her chronic medical conditions.  HTN: BP is well-controlled. Takes medications regularly. Patient denies headache, dizziness, chest pain, dyspnea or palpitations.  CKD: Follows up with Dr Justin Mend.  Gout: She had joint fluid aspiration, showed evidence of gout. She is willing to start Allopurinol.  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HCC)     Past Surgical History:  Procedure Laterality Date   ABDOMINAL HYSTERECTOMY     CHOLECYSTECTOMY N/A 01/28/2015   Procedure: LAPAROSCOPIC CHOLECYSTECTOMY;  Surgeon: Aviva Signs Md, MD;  Location: [facility];  Service: General;  Laterality: N/A;   excision of bone spurs Bilateral    Big toes   renal aneurysm Right    right coil-and only has function of left kidney   X-STOP IMPLANTATION      Family History  Problem Relation Age of Onset   Hypertension Mother    Heart disease Mother        Cardiac Arrest    Hypertension Father    Cancer Father    Hypertension Sister    Cancer Brother        Bladder Cancer    Hypertension Brother    Hypertension Sister     Social History   Socioeconomic History   Marital status: Single    Spouse name: Not on file   Number of children: Not on file   Years of education: Not on file   Highest education  level: Not on file  Occupational History   Occupation: Retired  Tobacco Use   Smoking status: Former    Packs/day: 0.25    Years: 20.00    Pack years: 5.00    Types: Cigarettes    Quit date: 2019    Years since quitting: 3.4   Smokeless tobacco: Never  Vaping Use   Vaping Use: Never used  Substance and Sexual Activity   Alcohol use: No   Drug use: No   Sexual activity: Yes    Birth control/protection: Surgical  Other Topics Concern   Not on file  Social History Narrative   Not on file   Social Determinants of Health   Financial Resource Strain: Not on file  Food Insecurity: Not on file  Transportation Needs: Not on file  Physical Activity: Not on file  Stress: Not on file  Social Connections: Not on file  Intimate Partner Violence: Not on file    Outpatient Medications Prior to Visit  Medication Sig Dispense Refill   acetaminophen (TYLENOL) 500 MG tablet Take 500-1,000 mg by mouth every 6 (six) hours as needed (for pain.).     acetaminophen-codeine (TYLENOL #3) 300-30 MG tablet Take 1 tablet by mouth every 6 (  six) hours as needed for moderate pain. 30 tablet 0   albuterol (VENTOLIN HFA) 108 (90 Base) MCG/ACT inhaler Inhale 1-2 puffs into the lungs every 6 (six) hours as needed for wheezing or shortness of breath. 1 each 2   budesonide-formoterol (SYMBICORT) 80-4.5 MCG/ACT inhaler Inhale 2 puffs into the lungs 2 (two) times daily. 1 each 2   atenolol (TENORMIN) 50 MG tablet Take 1 tablet (50 mg total) by mouth daily. 90 tablet 1   CARTIA XT 240 MG 24 hr capsule Take 1 capsule (240 mg total) by mouth daily. 90 capsule 0   clopidogrel (PLAVIX) 75 MG tablet Take 1 tablet (75 mg total) by mouth every evening. 90 tablet 3   dexlansoprazole (DEXILANT) 60 MG capsule Take 1 capsule (60 mg total) by mouth daily. (Patient taking differently: Take 60 mg by mouth as needed.) 30 capsule 3   No facility-administered medications prior to visit.    Allergies  Allergen Reactions    Bee Venom Hives   Nsaids     Kidney disease    Shellfish Allergy Itching    ROS Review of Systems  Constitutional:  Negative for chills and fever.  HENT:  Negative for congestion, sinus pressure, sinus pain and sore throat.   Eyes:  Negative for pain and discharge.  Respiratory:  Negative for cough and shortness of breath.   Cardiovascular:  Negative for chest pain and palpitations.  Gastrointestinal:  Negative for abdominal pain, constipation, diarrhea and vomiting.  Endocrine: Negative for polydipsia and polyuria.  Genitourinary:  Negative for dysuria and hematuria.  Musculoskeletal:  Positive for arthralgias and back pain. Negative for neck pain and neck stiffness.  Skin:  Negative for rash.  Neurological:  Negative for dizziness and weakness.  Psychiatric/Behavioral:  Negative for agitation and behavioral problems.      Objective:    Physical Exam Vitals reviewed.  Constitutional:      General: She is not in acute distress.    Appearance: She is not diaphoretic.  HENT:     Head: Normocephalic and atraumatic.     Nose: Nose normal.     Mouth/Throat:     Mouth: Mucous membranes are moist.  Eyes:     General: No scleral icterus.    Extraocular Movements: Extraocular movements intact.  Cardiovascular:     Rate and Rhythm: Normal rate and regular rhythm.     Pulses: Normal pulses.     Heart sounds: Normal heart sounds. No murmur heard. Pulmonary:     Breath sounds: Normal breath sounds. No wheezing or rales.  Musculoskeletal:     Cervical back: Neck supple. No tenderness.     Right lower leg: No edema.     Left lower leg: No edema.  Skin:    General: Skin is warm.     Findings: No rash.  Neurological:     General: No focal deficit present.     Mental Status: She is alert and oriented to person, place, and time.  Psychiatric:        Mood and Affect: Mood normal.        Behavior: Behavior normal.    BP 130/82 (BP Location: Left Arm)   Pulse 62   Temp 98.6 F  (37 C) (Oral)   Resp 18   Ht 5\' 7"  (1.702 m)   Wt 167 lb 6.4 oz (75.9 kg)   SpO2 97%   BMI 26.22 kg/m  Wt Readings from Last 3 Encounters:  03/25/21 167 lb 6.4 oz (  75.9 kg)  03/04/21 174 lb (78.9 kg)  03/01/21 174 lb (78.9 kg)     Health Maintenance Due  Topic Date Due   Pneumococcal Vaccine 24-74 Years old (1 - PCV) Never done   OPHTHALMOLOGY EXAM  Never done   URINE MICROALBUMIN  Never done   Zoster Vaccines- Shingrix (1 of 2) Never done   COLONOSCOPY (Pts 45-70yrs Insurance coverage will need to be confirmed)  Never done   MAMMOGRAM  Never done   HEMOGLOBIN A1C  12/17/2020   COVID-19 Vaccine (4 - Booster for Moderna series) 01/12/2021    There are no preventive care reminders to display for this patient.  Lab Results  Component Value Date   TSH 2.43 06/19/2020   Lab Results  Component Value Date   WBC 8.4 01/15/2021   HGB 13.3 01/15/2021   HCT 39.3 01/15/2021   MCV 96.1 01/15/2021   PLT 313 01/15/2021   Lab Results  Component Value Date   NA 137 03/04/2021   K CANCELED 03/04/2021   CO2 24 03/04/2021   GLUCOSE 132 (H) 03/04/2021   BUN 32 (H) 03/04/2021   CREATININE 1.34 (H) 03/04/2021   BILITOT CANCELED 03/04/2021   ALKPHOS 60 01/26/2015   AST CANCELED 03/04/2021   ALT 30 (H) 03/04/2021   PROT 8.1 03/04/2021   ALBUMIN 4.0 01/26/2015   CALCIUM 10.5 (H) 03/04/2021   ANIONGAP 10 01/26/2015   Lab Results  Component Value Date   CHOL 157 06/19/2020   Lab Results  Component Value Date   HDL 24 (L) 06/19/2020   Lab Results  Component Value Date   LDLCALC 92 06/19/2020   Lab Results  Component Value Date   TRIG 287 (H) 06/19/2020   Lab Results  Component Value Date   CHOLHDL 6.5 (H) 06/19/2020   Lab Results  Component Value Date   HGBA1C 6.7 (H) 06/19/2020      Assessment & Plan:   Problem List Items Addressed This Visit       Cardiovascular and Mediastinum   Hypertension - Primary    BP Readings from Last 1 Encounters:  03/25/21  130/82  Well-controlled with Atenolol and Cartia Counseled for compliance with the medications Advised DASH diet and moderate exercise/walking, at least 150 mins/week        Relevant Medications   CARTIA XT 240 MG 24 hr capsule   atenolol (TENORMIN) 50 MG tablet   hydrochlorothiazide (HYDRODIURIL) 25 MG tablet   Carotid arterial disease (HCC)    On Plavix Not on statin currently, resistant for starting new medications       Relevant Medications   CARTIA XT 240 MG 24 hr capsule   atenolol (TENORMIN) 50 MG tablet   clopidogrel (PLAVIX) 75 MG tablet   hydrochlorothiazide (HYDRODIURIL) 25 MG tablet     Respiratory   COPD, mild (HCC)    Well controlled with Symbicort Albuterol as needed         Digestive   GERD (gastroesophageal reflux disease)    On Dexilant       Relevant Medications   dexlansoprazole (DEXILANT) 60 MG capsule     Genitourinary   CKD (chronic kidney disease) stage 3, GFR 30-59 ml/min (HCC)    Last BMP reviewed in chart Follows up with Nephrologist Avoid nephrotoxic agents         Other   Gout    Had joint fluid aspiration - showed monosodium urate crystals Started Allopurinol 100 mg QD Advised to discuss with Nephrology if  able to increase dose       Relevant Medications   allopurinol (ZYLOPRIM) 100 MG tablet    Meds ordered this encounter  Medications   allopurinol (ZYLOPRIM) 100 MG tablet    Sig: Take 1 tablet (100 mg total) by mouth daily.    Dispense:  30 tablet    Refill:  5   CARTIA XT 240 MG 24 hr capsule    Sig: Take 1 capsule (240 mg total) by mouth daily.    Dispense:  90 capsule    Refill:  0   atenolol (TENORMIN) 50 MG tablet    Sig: Take 1 tablet (50 mg total) by mouth daily.    Dispense:  90 tablet    Refill:  1   clopidogrel (PLAVIX) 75 MG tablet    Sig: Take 1 tablet (75 mg total) by mouth every evening.    Dispense:  90 tablet    Refill:  3   dexlansoprazole (DEXILANT) 60 MG capsule    Sig: Take 1 capsule  (60 mg total) by mouth daily.    Dispense:  30 capsule    Refill:  3   hydrochlorothiazide (HYDRODIURIL) 25 MG tablet    Sig: Take 1 tablet (25 mg total) by mouth daily.    Dispense:  90 tablet    Refill:  1    Follow-up: Return in about 6 months (around 09/24/2021) for Annual physical.    Lindell Spar, MD

## 2021-03-25 NOTE — Assessment & Plan Note (Signed)
Last BMP reviewed in chart Follows up with Nephrologist Avoid nephrotoxic agents

## 2021-03-25 NOTE — Assessment & Plan Note (Signed)
Well controlled with Symbicort Albuterol as needed

## 2021-03-25 NOTE — Assessment & Plan Note (Signed)
BP Readings from Last 1 Encounters:  03/25/21 130/82   Well-controlled with Atenolol and Cartia Counseled for compliance with the medications Advised DASH diet and moderate exercise/walking, at least 150 mins/week

## 2021-03-25 NOTE — Patient Instructions (Signed)
Please start taking Allopurinol 0.5 tablet for 1 week and then 1 tablet once every day.  Please continue to take other medications as prescribed.  Please discuss with Dr Justin Mend if we can increase dose of Allopurinol.  Continue to follow low salt diet and have small, frequent meals.

## 2021-03-25 NOTE — Assessment & Plan Note (Addendum)
Had joint fluid aspiration - showed monosodium urate crystals Started Allopurinol 100 mg QD Advised to discuss with Nephrology if able to increase dose

## 2021-03-25 NOTE — Assessment & Plan Note (Signed)
On Plavix Not on statin currently, resistant for starting new medications 

## 2021-03-25 NOTE — Assessment & Plan Note (Signed)
On Dexilant 

## 2021-04-01 ENCOUNTER — Ambulatory Visit: Payer: Medicare Other | Admitting: Rheumatology

## 2021-04-20 LAB — HM DIABETES EYE EXAM

## 2021-05-24 ENCOUNTER — Other Ambulatory Visit: Payer: Self-pay | Admitting: *Deleted

## 2021-05-24 ENCOUNTER — Ambulatory Visit (INDEPENDENT_AMBULATORY_CARE_PROVIDER_SITE_OTHER): Payer: Medicare Other | Admitting: Internal Medicine

## 2021-05-24 ENCOUNTER — Encounter: Payer: Self-pay | Admitting: Internal Medicine

## 2021-05-24 ENCOUNTER — Other Ambulatory Visit: Payer: Self-pay

## 2021-05-24 VITALS — BP 122/76 | HR 59 | Temp 98.7°F | Resp 18 | Ht 66.0 in | Wt 171.0 lb

## 2021-05-24 DIAGNOSIS — I10 Essential (primary) hypertension: Secondary | ICD-10-CM

## 2021-05-24 DIAGNOSIS — I6523 Occlusion and stenosis of bilateral carotid arteries: Secondary | ICD-10-CM

## 2021-05-24 DIAGNOSIS — M7021 Olecranon bursitis, right elbow: Secondary | ICD-10-CM | POA: Diagnosis not present

## 2021-05-24 MED ORDER — CLOPIDOGREL BISULFATE 75 MG PO TABS: 75.0000 mg | ORAL_TABLET | Freq: Every evening | ORAL | 3 refills | Status: DC

## 2021-05-24 MED ORDER — ATENOLOL 50 MG PO TABS: 50.0000 mg | ORAL_TABLET | Freq: Every day | ORAL | 1 refills | Status: DC

## 2021-05-24 MED ORDER — PREDNISONE 10 MG (21) PO TBPK: ORAL_TABLET | ORAL | 0 refills | Status: DC

## 2021-05-24 NOTE — Progress Notes (Signed)
Established Patient Office Visit  Subjective:  Patient ID: Jaclyn Brooks, female    DOB: 10-11-1960  Age: 60 y.o. MRN: [id number]  CC:  Chief Complaint  Patient presents with   Elbow Injury    Pt has been having pain in right arm and elbow since 05-21-21 it is a little warm to touch. It was a throbbing pain pt cant bend arm to get dressed or drive     HPI Jaclyn Brooks presents for evaluation of right elbow swelling for last 4 days. Denies any recent injury. She c/o right elbow pain, sharp,  7/10, nonradiating and is worse with movement. Denies any fever, chills, but has noticed local warmth.  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HCC)     Past Surgical History:  Procedure Laterality Date   ABDOMINAL HYSTERECTOMY     CHOLECYSTECTOMY N/A 01/28/2015   Procedure: LAPAROSCOPIC CHOLECYSTECTOMY;  Surgeon: Aviva Signs Md, MD;  Location: [facility];  Service: General;  Laterality: N/A;   excision of bone spurs Bilateral    Big toes   renal aneurysm Right    right coil-and only has function of left kidney   X-STOP IMPLANTATION      Family History  Problem Relation Age of Onset   Hypertension Mother    Heart disease Mother        Cardiac Arrest    Hypertension Father    Cancer Father    Hypertension Sister    Cancer Brother        Bladder Cancer    Hypertension Brother    Hypertension Sister     Social History   Socioeconomic History   Marital status: Single    Spouse name: Not on file   Number of children: Not on file   Years of education: Not on file   Highest education level: Not on file  Occupational History   Occupation: Retired  Tobacco Use   Smoking status: Former    Packs/day: 0.25    Years: 20.00    Pack years: 5.00    Types: Cigarettes    Quit date: 2019    Years since  quitting: 3.6   Smokeless tobacco: Never  Vaping Use   Vaping Use: Never used  Substance and Sexual Activity   Alcohol use: No   Drug use: No   Sexual activity: Yes    Birth control/protection: Surgical  Other Topics Concern   Not on file  Social History Narrative   Not on file   Social Determinants of Health   Financial Resource Strain: Not on file  Food Insecurity: Not on file  Transportation Needs: Not on file  Physical Activity: Not on file  Stress: Not on file  Social Connections: Not on file  Intimate Partner Violence: Not on file    Outpatient Medications Prior to Visit  Medication Sig Dispense Refill   acetaminophen (TYLENOL) 500 MG tablet Take 500-1,000 mg by mouth every 6 (six) hours as needed (for pain.).     albuterol (VENTOLIN HFA) 108 (90 Base) MCG/ACT inhaler Inhale 1-2 puffs into the lungs every 6 (six) hours as needed for wheezing or shortness of breath. 1 each 2   allopurinol (ZYLOPRIM) 100 MG tablet Take 1 tablet (100 mg  total) by mouth daily. 30 tablet 5   budesonide-formoterol (SYMBICORT) 80-4.5 MCG/ACT inhaler Inhale 2 puffs into the lungs 2 (two) times daily. 1 each 2   CARTIA XT 240 MG 24 hr capsule Take 1 capsule (240 mg total) by mouth daily. 90 capsule 0   dexlansoprazole (DEXILANT) 60 MG capsule Take 1 capsule (60 mg total) by mouth daily. 30 capsule 3   hydrochlorothiazide (HYDRODIURIL) 25 MG tablet Take 1 tablet (25 mg total) by mouth daily. 90 tablet 1   atenolol (TENORMIN) 50 MG tablet Take 1 tablet (50 mg total) by mouth daily. 90 tablet 1   clopidogrel (PLAVIX) 75 MG tablet Take 1 tablet (75 mg total) by mouth every evening. 90 tablet 3   acetaminophen-codeine (TYLENOL #3) 300-30 MG tablet Take 1 tablet by mouth every 6 (six) hours as needed for moderate pain. (Patient not taking: Reported on 05/24/2021) 30 tablet 0   No facility-administered medications prior to visit.    Allergies  Allergen Reactions   Bee Venom Hives   Nsaids     Kidney  disease    Shellfish Allergy Itching    ROS Review of Systems  Constitutional:  Negative for chills and fever.  HENT:  Negative for congestion, sinus pressure, sinus pain and sore throat.   Eyes:  Negative for pain and discharge.  Respiratory:  Negative for cough and shortness of breath.   Cardiovascular:  Negative for chest pain and palpitations.  Gastrointestinal:  Negative for abdominal pain, constipation, diarrhea and vomiting.  Endocrine: Negative for polydipsia and polyuria.  Genitourinary:  Negative for dysuria and hematuria.  Musculoskeletal:  Positive for arthralgias, back pain and joint swelling. Negative for neck pain and neck stiffness.  Skin:  Negative for rash.  Neurological:  Negative for dizziness and weakness.  Psychiatric/Behavioral:  Negative for agitation and behavioral problems.      Objective:    Physical Exam Vitals reviewed.  Constitutional:      General: She is not in acute distress.    Appearance: She is not diaphoretic.  HENT:     Head: Normocephalic and atraumatic.     Nose: Nose normal.     Mouth/Throat:     Mouth: Mucous membranes are moist.  Eyes:     General: No scleral icterus.    Extraocular Movements: Extraocular movements intact.  Cardiovascular:     Rate and Rhythm: Normal rate and regular rhythm.     Pulses: Normal pulses.     Heart sounds: Normal heart sounds. No murmur heard. Pulmonary:     Breath sounds: Normal breath sounds. No wheezing or rales.  Musculoskeletal:        General: Swelling (Right elbow, with tenderness) present.     Cervical back: Neck supple. No tenderness.     Right lower leg: No edema.     Left lower leg: No edema.  Skin:    General: Skin is warm.     Findings: No rash.  Neurological:     General: No focal deficit present.     Mental Status: She is alert and oriented to person, place, and time.  Psychiatric:        Mood and Affect: Mood normal.        Behavior: Behavior normal.    BP 122/76 (BP  Location: Left Arm, Patient Position: Sitting, Cuff Size: Normal)   Pulse (!) 59   Temp 98.7 F (37.1 C) (Oral)   Resp 18   Ht '5\' 6"'$  (1.676 m)  Wt 171 lb 0.6 oz (77.6 kg)   SpO2 94%   BMI 27.61 kg/m  Wt Readings from Last 3 Encounters:  05/24/21 171 lb 0.6 oz (77.6 kg)  03/25/21 167 lb 6.4 oz (75.9 kg)  03/04/21 174 lb (78.9 kg)     Health Maintenance Due  Topic Date Due   Pneumococcal Vaccine 47-44 Years old (1 - PCV) Never done   OPHTHALMOLOGY EXAM  Never done   URINE MICROALBUMIN  Never done   Zoster Vaccines- Shingrix (1 of 2) Never done   COLONOSCOPY (Pts 45-70yr Insurance coverage will need to be confirmed)  Never done   MAMMOGRAM  Never done   HEMOGLOBIN A1C  12/17/2020   COVID-19 Vaccine (4 - Booster for Moderna series) 01/12/2021    There are no preventive care reminders to display for this patient.  Lab Results  Component Value Date   TSH 2.43 06/19/2020   Lab Results  Component Value Date   WBC 8.4 01/15/2021   HGB 13.3 01/15/2021   HCT 39.3 01/15/2021   MCV 96.1 01/15/2021   PLT 313 01/15/2021   Lab Results  Component Value Date   NA 137 03/04/2021   K CANCELED 03/04/2021   CO2 24 03/04/2021   GLUCOSE 132 (H) 03/04/2021   BUN 32 (H) 03/04/2021   CREATININE 1.34 (H) 03/04/2021   BILITOT CANCELED 03/04/2021   ALKPHOS 60 01/26/2015   AST CANCELED 03/04/2021   ALT 30 (H) 03/04/2021   PROT 8.1 03/04/2021   ALBUMIN 4.0 01/26/2015   CALCIUM 10.5 (H) 03/04/2021   ANIONGAP 10 01/26/2015   Lab Results  Component Value Date   CHOL 157 06/19/2020   Lab Results  Component Value Date   HDL 24 (L) 06/19/2020   Lab Results  Component Value Date   LDLCALC 92 06/19/2020   Lab Results  Component Value Date   TRIG 287 (H) 06/19/2020   Lab Results  Component Value Date   CHOLHDL 6.5 (H) 06/19/2020   Lab Results  Component Value Date   HGBA1C 6.7 (H) 06/19/2020      Assessment & Plan:   Problem List Items Addressed This Visit    Visit  Diagnoses     Olecranon bursitis of right elbow    -  Primary Nontraumatic elbow pain with swelling Likely from repetitive movement vs constant friction while playing computer games Unable to use NSAIDs due to CKD, started Prednisone taper Tylenol PRN for pain Stretching exercises as tolerated Heating pad or ice If persistent, will need to contact Orthopedic surgery   Relevant Medications   predniSONE (STERAPRED UNI-PAK 21 TAB) 10 MG (21) TBPK tablet       Meds ordered this encounter  Medications   predniSONE (STERAPRED UNI-PAK 21 TAB) 10 MG (21) TBPK tablet    Sig: Take as package instructions.    Dispense:  1 each    Refill:  0    Follow-up: Return if symptoms worsen or fail to improve.    RLindell Spar MD

## 2021-05-26 ENCOUNTER — Encounter: Payer: Self-pay | Admitting: *Deleted

## 2021-06-02 ENCOUNTER — Other Ambulatory Visit: Payer: Self-pay | Admitting: Internal Medicine

## 2021-06-02 ENCOUNTER — Telehealth: Payer: Self-pay | Admitting: Internal Medicine

## 2021-06-02 DIAGNOSIS — M7021 Olecranon bursitis, right elbow: Secondary | ICD-10-CM

## 2021-06-02 MED ORDER — PREDNISONE 20 MG PO TABS: 20.0000 mg | ORAL_TABLET | Freq: Every day | ORAL | 0 refills | Status: DC

## 2021-06-02 NOTE — Telephone Encounter (Signed)
Patient called upset because no one called her back last Friday and she is still having issues with her right elbow.    Please call her back as soon as possible.

## 2021-06-02 NOTE — Telephone Encounter (Signed)
Pt said she finished her Prednisone and her elbow is still bothering her. It has eased up some but you told her to call after finishing that up. Please advise.

## 2021-06-03 ENCOUNTER — Other Ambulatory Visit: Payer: Self-pay

## 2021-06-03 ENCOUNTER — Ambulatory Visit: Payer: Medicare Other

## 2021-06-03 NOTE — Telephone Encounter (Signed)
Pt informed

## 2021-06-06 ENCOUNTER — Other Ambulatory Visit: Payer: Self-pay | Admitting: Internal Medicine

## 2021-06-06 DIAGNOSIS — I10 Essential (primary) hypertension: Secondary | ICD-10-CM

## 2021-06-17 ENCOUNTER — Ambulatory Visit (HOSPITAL_COMMUNITY): Payer: Medicare Other

## 2021-06-24 ENCOUNTER — Emergency Department (HOSPITAL_BASED_OUTPATIENT_CLINIC_OR_DEPARTMENT_OTHER)
Admission: EM | Admit: 2021-06-24 | Discharge: 2021-06-24 | Disposition: A | Discharge status: Home / Self Care | Payer: Medicare Other | Attending: Emergency Medicine | Admitting: Emergency Medicine

## 2021-06-24 ENCOUNTER — Emergency Department (HOSPITAL_BASED_OUTPATIENT_CLINIC_OR_DEPARTMENT_OTHER): Payer: Medicare Other | Admitting: Radiology

## 2021-06-24 ENCOUNTER — Encounter (HOSPITAL_BASED_OUTPATIENT_CLINIC_OR_DEPARTMENT_OTHER): Payer: Self-pay | Admitting: *Deleted

## 2021-06-24 ENCOUNTER — Other Ambulatory Visit: Payer: Self-pay

## 2021-06-24 ENCOUNTER — Telehealth: Payer: Self-pay

## 2021-06-24 DIAGNOSIS — Y9389 Activity, other specified: Secondary | ICD-10-CM | POA: Insufficient documentation

## 2021-06-24 DIAGNOSIS — J449 Chronic obstructive pulmonary disease, unspecified: Secondary | ICD-10-CM | POA: Diagnosis not present

## 2021-06-24 DIAGNOSIS — N183 Chronic kidney disease, stage 3 unspecified: Secondary | ICD-10-CM | POA: Insufficient documentation

## 2021-06-24 DIAGNOSIS — M7021 Olecranon bursitis, right elbow: Secondary | ICD-10-CM | POA: Insufficient documentation

## 2021-06-24 DIAGNOSIS — J45909 Unspecified asthma, uncomplicated: Secondary | ICD-10-CM | POA: Insufficient documentation

## 2021-06-24 DIAGNOSIS — Z7951 Long term (current) use of inhaled steroids: Secondary | ICD-10-CM | POA: Diagnosis not present

## 2021-06-24 DIAGNOSIS — I129 Hypertensive chronic kidney disease with stage 1 through stage 4 chronic kidney disease, or unspecified chronic kidney disease: Secondary | ICD-10-CM | POA: Insufficient documentation

## 2021-06-24 DIAGNOSIS — Z87891 Personal history of nicotine dependence: Secondary | ICD-10-CM | POA: Insufficient documentation

## 2021-06-24 DIAGNOSIS — M7989 Other specified soft tissue disorders: Secondary | ICD-10-CM | POA: Diagnosis not present

## 2021-06-24 DIAGNOSIS — E1122 Type 2 diabetes mellitus with diabetic chronic kidney disease: Secondary | ICD-10-CM | POA: Insufficient documentation

## 2021-06-24 DIAGNOSIS — Z79899 Other long term (current) drug therapy: Secondary | ICD-10-CM | POA: Insufficient documentation

## 2021-06-24 DIAGNOSIS — M25521 Pain in right elbow: Secondary | ICD-10-CM | POA: Diagnosis not present

## 2021-06-24 MED ORDER — HYDROCODONE-ACETAMINOPHEN 5-325 MG PO TABS
1.0000 | ORAL_TABLET | Freq: Four times a day (QID) | ORAL | 0 refills | Status: DC | PRN
Start: 2021-06-24 — End: 2021-07-02

## 2021-06-24 MED ORDER — METHOCARBAMOL 500 MG PO TABS: 500.0000 mg | ORAL_TABLET | Freq: Two times a day (BID) | ORAL | 0 refills | Status: DC

## 2021-06-24 NOTE — ED Provider Notes (Signed)
[facility] EMERGENCY DEPT Provider Note   CSN: [id number] Arrival date & time: 06/24/21  1018     History Chief Complaint  Patient presents with   Elbow Pain    Jaclyn Brooks is a 60 y.o. female with a past medical history of CKD, hypertension presenting to the ED with a chief complaint of continued right elbow pain.  She has had intermittent pain for the past several months.  She has been placed on prednisone several weeks ago which intermittently alleviated the pain but then it recurred.  She tried to get in touch with her PCP again but did not have an available appointment for another few weeks.  She also try to get in touch with her orthopedist but was unable to get an appointment soon.  She has not take any other medications to help with symptoms.  Denies any initial injury or trauma.  No prior fracture, dislocation or procedure in the area.  Denies any fevers, numbness or weakness.  HPI     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[facility])     Patient Active Problem List   Diagnosis Date Noted   Gout 03/25/2021   Diabetes mellitus without complication ([place]) 99991111   Primary osteoarthritis of right foot 05/12/2020   GERD (gastroesophageal reflux disease) 05/31/2019   Solitary kidney, acquired 05/10/2019   Carotid arterial disease ([place]) 12/11/2018   COPD, mild ([place]) 10/01/2018   CKD (chronic kidney disease) stage 3, GFR 30-59 ml/min (HCC) 10/01/2018   Aortic valve sclerosis 10/01/2018   Hypertension 09/11/2018    Past Surgical History:  Procedure Laterality Date   ABDOMINAL HYSTERECTOMY     CHOLECYSTECTOMY N/A 01/28/2015   Procedure: LAPAROSCOPIC CHOLECYSTECTOMY;  Surgeon: Aviva Signs Md, MD;  Location: [facility];  Service: General;  Laterality: N/A;   excision of bone spurs  Bilateral    Big toes   renal aneurysm Right    right coil-and only has function of left kidney   X-STOP IMPLANTATION       OB History     Gravida  1   Para  1   Term      Preterm  1   AB      Living         SAB      IAB      Ectopic      Multiple      Live Births              Family History  Problem Relation Age of Onset   Hypertension Mother    Heart disease Mother        Cardiac Arrest    Hypertension Father    Cancer Father    Hypertension Sister    Cancer Brother        Bladder Cancer    Hypertension Brother    Hypertension Sister     Social History   Tobacco Use   Smoking status: Former    Packs/day: 0.25    Years: 20.00    Pack years: 5.00    Types: Cigarettes    Quit date: 2019    Years since quitting: 3.7   Smokeless tobacco: Never  Vaping Use   Vaping Use: Never used  Substance Use Topics   Alcohol use: Yes    Comment: occasionally   Drug use: No    Home Medications Prior to Admission medications   Medication Sig Start Date End Date Taking? Authorizing Provider  allopurinol (ZYLOPRIM) 100 MG tablet Take 1 tablet (100 mg total) by mouth daily. 03/25/21  Yes Lindell Spar, MD  atenolol (TENORMIN) 50 MG tablet Take 1 tablet (50 mg total) by mouth daily. 05/24/21  Yes Lindell Spar, MD  budesonide-formoterol [facility]) 80-4.5 MCG/ACT inhaler Inhale 2 puffs into the lungs 2 (two) times daily. 08/28/20  Yes [place], Modena Nunnery, MD  CARTIA XT 240 MG 24 hr capsule Take 1 capsule (240 mg total) by mouth daily. 03/25/21  Yes Lindell Spar, MD  clopidogrel (PLAVIX) 75 MG tablet Take 1 tablet (75 mg total) by mouth every evening. 05/24/21  Yes Lindell Spar, MD  hydrochlorothiazide (HYDRODIURIL) 25 MG tablet Take 1 tablet by mouth once daily 06/07/21  Yes Lindell Spar, MD  HYDROcodone-acetaminophen (NORCO/VICODIN) 5-325 MG tablet Take 1 tablet by mouth every 6 (six) hours as needed. 06/24/21  Yes Eli Adami, PA-C  methocarbamol  (ROBAXIN) 500 MG tablet Take 1 tablet (500 mg total) by mouth 2 (two) times daily. 06/24/21  Yes Trease Bremner, PA-C  acetaminophen (TYLENOL) 500 MG tablet Take 500-1,000 mg by mouth every 6 (six) hours as needed (for pain.).    [provider]  albuterol (VENTOLIN HFA) 108 (90 Base) MCG/ACT inhaler Inhale 1-2 puffs into the lungs every 6 (six) hours as needed for wheezing or shortness of breath. 11/26/20   Lindell Spar, MD  dexlansoprazole (DEXILANT) 60 MG capsule Take 1 capsule (60 mg total) by mouth daily. 03/25/21   Lindell Spar, MD  atorvastatin (LIPITOR) 20 MG tablet Take 1 tablet (20 mg total) by mouth at bedtime. Patient not taking: Reported on 01/24/2020 11/25/19 02/18/20  Alycia Rossetti, MD    Allergies    Bee venom, Nsaids, and Shellfish allergy  Review of Systems   Review of Systems  Constitutional:  Negative for chills and fever.  Musculoskeletal:  Positive for arthralgias. Negative for joint swelling.  Skin:  Negative for wound.  Neurological:  Negative for weakness and numbness.   Physical Exam Updated Vital Signs BP (!) 146/68 (BP Location: Left Arm)   Pulse (!) 55   Temp 99 F (37.2 C)   Resp 16   Ht '5\' 6"'$  (1.676 m)   Wt 77.6 kg   SpO2 100%   BMI 27.60 kg/m   Physical Exam Vitals and nursing note reviewed.  Constitutional:      General: She is not in acute distress.    Appearance: She is well-developed. She is not diaphoretic.  HENT:     Head: Normocephalic and atraumatic.  Eyes:     General: No scleral icterus.    Conjunctiva/sclera: Conjunctivae normal.  Pulmonary:     Effort: Pulmonary effort is normal. No respiratory distress.  Musculoskeletal:        General: Tenderness present. No swelling.     Cervical back: Normal range of motion.     Comments: Tenderness palpation of the right elbow.  No erythema or warmth of joint.  2+ radial pulse palpated bilaterally.  Able to actively flex and extend although reports pain with this.  Full passive  range of motion of right elbow.  Normal sensation to light touch of bilateral upper extremities.  Strength 5/5 in bilateral upper extremities  Skin:  Findings: No rash.  Neurological:     Mental Status: She is alert.    ED Results / Procedures / Treatments   Labs (all labs ordered are listed, but only abnormal results are displayed) Labs Reviewed - No data to display  EKG None  Radiology DG Elbow Complete Right  Result Date: 06/24/2021 CLINICAL DATA:  Pain, swelling EXAM: RIGHT ELBOW - COMPLETE 3+ VIEW COMPARISON:  Elbow radiographs 04/17/2017 FINDINGS: There is no acute fracture or dislocation. Elbow alignment is normal. The joint spaces are preserved. There is minimal spurring of the olecranon with mild overlying soft tissue swelling. There is no effusion. IMPRESSION: 1. Mild swelling over the olecranon. Correlate for signs or symptoms of bursitis. 2. No acute osseous abnormality. Electronically Signed   By: Valetta Mole M.D.   On: 06/24/2021 12:15    Procedures Procedures   Medications Ordered in ED Medications - No data to display  ED Course  I have reviewed the triage vital signs and the nursing notes.  Pertinent labs & imaging results that were available during my care of the patient were reviewed by me and considered in my medical decision making (see chart for details).    MDM Rules/Calculators/A&P                           60 year old female presenting to the ED for right elbow pain.  She has had intermittent pain for the past several months.  Intermittently improved with prednisone but then the pain recurred.  Tried to see her orthopedist and PCP but was unable to do so.  No injury or trauma.  No prior fracture, dislocation or procedure in the area.  On exam there are some tenderness of the right elbow.  There is no overlying skin changes.  Areas are vastly intact.  She is able to exhibit range of motion.  X-ray shows findings concerning for bursitis.  She is aware  that this is her diagnosis.  Coincidently saw a note from her orthopedist about 2 hours ago and she was told to come to the clinic tomorrow for a joint injection.  She got in touch with her orthopedist while she was here in the ER and is scheduled for this tomorrow afternoon.  In the meantime we will give her pain control for home. I doubt this is due to a septic joint based on her physical exam findings and x-ray findings without evidence of effusion.  Patient is agreeable to the plan.  Return precautions given  All imaging, if done today, including plain films, CT scans, and ultrasounds, independently reviewed by me, and interpretations confirmed via formal radiology reads.  Patient is hemodynamically stable, in NAD, and able to ambulate in the ED. Evaluation does not show pathology that would require ongoing emergent intervention or inpatient treatment. I explained the diagnosis to the patient. Pain has been managed and has no complaints prior to discharge. Patient is comfortable with above plan and is stable for discharge at this time. All questions were answered prior to disposition. Strict return precautions for returning to the ED were discussed. Encouraged follow up with PCP.   Prior to providing a prescription for a controlled substance, I independently reviewed the patient's recent prescription history on the [place]. The patient had no recent or regular prescriptions and was deemed appropriate for a brief, less than 3 day prescription of narcotic for acute analgesia.  An After Visit Summary was  printed and given to the patient.   Portions of this note were generated with [employer]. Dictation errors may occur despite best attempts at proofreading.  Final Clinical Impression(s) / ED Diagnoses Final diagnoses:  Olecranon bursitis of right elbow    Rx / DC Orders ED Discharge Orders          Ordered    HYDROcodone-acetaminophen  (NORCO/VICODIN) 5-325 MG tablet  Every 6 hours PRN        06/24/21 1307    methocarbamol (ROBAXIN) 500 MG tablet  2 times daily        06/24/21 1307             Delia Heady, PA-C 06/24/21 1311    Gareth Morgan, MD 06/25/21 1115

## 2021-06-24 NOTE — Telephone Encounter (Signed)
She could come in for elbow injection tomorrow afternoon if she wants

## 2021-06-24 NOTE — Discharge Instructions (Addendum)
See orthopedic provider. Take the pain medicine as needed. Return to the ER for any injuries or falls, numbness, weakness or increased swelling.

## 2021-06-24 NOTE — ED Triage Notes (Signed)
Intermittent elbow pain for 3 weeks and has been getting Prednisone injections with relief but not completely get the pain away.  Patient denies injury.

## 2021-06-24 NOTE — Telephone Encounter (Signed)
Patient called stating that she can't move or bend her right elbow.  Stated that the pain is making her feel bad and was prescribed Prednisone which helps, but the pain comes right back. Patient did state that she was going to the ED to be seen.  Cb# [phone].

## 2021-06-25 ENCOUNTER — Encounter: Payer: Self-pay | Admitting: Surgical

## 2021-06-25 ENCOUNTER — Ambulatory Visit (INDEPENDENT_AMBULATORY_CARE_PROVIDER_SITE_OTHER): Payer: Medicare Other | Admitting: Surgical

## 2021-06-25 DIAGNOSIS — M1A0210 Idiopathic chronic gout, right elbow, without tophus (tophi): Secondary | ICD-10-CM | POA: Diagnosis not present

## 2021-06-25 MED ORDER — METHYLPREDNISOLONE ACETATE 40 MG/ML IJ SUSP
40.0000 mg | INTRAMUSCULAR | Status: AC | PRN
  Administered 2021-06-25: 40 mg via INTRA_ARTICULAR

## 2021-06-25 MED ORDER — BUPIVACAINE HCL 0.25 % IJ SOLN
4.0000 mL | INTRAMUSCULAR | Status: AC | PRN
  Administered 2021-06-25: 4 mL via INTRA_ARTICULAR

## 2021-06-25 MED ORDER — LIDOCAINE HCL 1 % IJ SOLN
5.0000 mL | INTRAMUSCULAR | Status: AC | PRN
  Administered 2021-06-25: 5 mL

## 2021-06-25 NOTE — Telephone Encounter (Signed)
Patient has been scheduled to see luke at 1:30 on 9/16

## 2021-06-25 NOTE — Progress Notes (Signed)
Office Visit Note   Patient: Jaclyn Brooks           Date of Birth: 08-29-1961           MRN: [id number] Visit Date: 06/25/2021 Requested by: Lindell Spar, MD [address] [place],  [place] [address] PCP: Lindell Spar, MD  Subjective: Chief Complaint  Patient presents with   Right Elbow - Follow-up    HPI: Jaclyn Brooks is a 60 y.o. female who presents to the office complaining of right elbow pain.  Pain is been present for about a month and she has increased pain with full extension of the elbow as well as rotation.  She has had multiple prednisone courses orally that have provided some relief but have not fully resolved her symptoms.  She localizes pain diffusely throughout the elbow but also over the olecranon.  Denies any fevers, chills, night sweats, drainage.  She has been using hydrocodone and taking muscle relaxer as needed for pain.  She has history of gout but has been taking allopurinol.  No history of injury..                ROS: All systems reviewed are negative as they relate to the chief complaint within the history of present illness.  Patient denies fevers or chills.  Assessment & Plan: Visit Diagnoses:  1. 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  She has history of gout.  She does have radiographs from her visit to the emergency department yesterday demonstrating no acute fracture or injury of the right elbow.  She is here today for right elbow injection.  She tolerated the procedure well.  Plan to have her follow-up in 2 weeks for clinical recheck.  If no improvement, consider advanced imaging at that time of the elbow.  Does not seem to be olecranon bursitis based on lack of fluctuance or increased warmth over the site today.  She does have some point tenderness over the olecranon bursa but she has tenderness diffusely throughout the elbow as well.  Follow-Up  Instructions: No follow-ups on file.   Orders:  No orders of the defined types were placed in this encounter.  No orders of the defined types were placed in this encounter.     Procedures: Medium Joint Inj: R elbow on 06/25/2021 5:42 PM Indications: pain and diagnostic evaluation Details: 18 G 1.5 in needle, lateral approach Medications: 5 mL lidocaine 1 %; 40 mg methylPREDNISolone acetate 40 MG/ML; 4 mL bupivacaine 0.25 % Outcome: tolerated well, no immediate complications Consent was given by the patient. Immediately prior to procedure a time out was called to verify the correct patient, procedure, equipment, support staff and site/side marked as required. Patient was prepped and draped in the usual sterile fashion.      Clinical Data: No additional findings.  Objective: Vital Signs: There were no vitals taken for this visit.  Physical Exam:  Constitutional: Patient appears well-developed HEENT:  Head: Normocephalic Eyes:EOM are normal Neck: Normal range of motion Cardiovascular: Normal rate Pulmonary/chest: Effort normal Neurologic: Patient is alert Skin: Skin is warm Psychiatric: Patient has normal mood and affect  Ortho Exam: Ortho exam demonstrates right elbow with tenderness diffusely throughout the elbow as well as over the olecranon.  She has pain with passive flexion and extension of the elbow as well as pronation and supination to a  lesser degree.  There is no increased warmth over the olecranon compared with the contralateral side.  No distinct area of fluctuance over the olecranon compared to the contralateral side excellent strength of bicep and tricep with bicep tendon palpable.  2+ radial pulse.  Specialty Comments:  No specialty comments available.  Imaging: No results found.   PMFS History: Patient Active Problem List   Diagnosis Date Noted   Gout 03/25/2021   Diabetes mellitus without complication ([place]) 99991111   Primary osteoarthritis of right  foot 05/12/2020   GERD (gastroesophageal reflux disease) 05/31/2019   Solitary kidney, acquired 05/10/2019   Carotid arterial disease ([place]) 12/11/2018   COPD, mild ([place]) 10/01/2018   CKD (chronic kidney disease) stage 3, GFR 30-59 ml/min ([facility]) 10/01/2018   Aortic valve sclerosis 10/01/2018   Hypertension 09/11/2018   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[place])     Family History  Problem Relation Age of Onset   Hypertension Mother    Heart disease Mother        Cardiac Arrest    Hypertension Father    Cancer Father    Hypertension Sister    Cancer Brother        Bladder Cancer    Hypertension Brother    Hypertension Sister     Past Surgical History:  Procedure Laterality Date   ABDOMINAL HYSTERECTOMY     CHOLECYSTECTOMY N/A 01/28/2015   Procedure: LAPAROSCOPIC CHOLECYSTECTOMY;  Surgeon: Aviva Signs Md, MD;  Location: [facility];  Service: General;  Laterality: N/A;   excision of bone spurs Bilateral    Big toes   renal aneurysm Right    right coil-and only has function of left kidney   X-STOP IMPLANTATION     Social History   Occupational History   Occupation: Retired  Tobacco Use   Smoking status: Former    Packs/day: 0.25    Years: 20.00    Pack years: 5.00    Types: Cigarettes    Quit date: 2019    Years since quitting: 3.7   Smokeless tobacco: Never  Vaping Use   Vaping Use: Never used  Substance and Sexual Activity   Alcohol use: Yes    Comment: occasionally   Drug use: No   Sexual activity: Yes    Birth control/protection: Surgical

## 2021-07-01 ENCOUNTER — Telehealth: Payer: Self-pay

## 2021-07-01 NOTE — Telephone Encounter (Signed)
Pt called stating that she got the injection Friday and her arm is still in pain. She would like some pain pills till her next apt in October.

## 2021-07-02 ENCOUNTER — Other Ambulatory Visit: Payer: Self-pay | Admitting: Surgical

## 2021-07-02 MED ORDER — ACETAMINOPHEN-CODEINE #3 300-30 MG PO TABS: 1.0000 | ORAL_TABLET | Freq: Every day | ORAL | 0 refills | Status: DC | PRN

## 2021-07-02 NOTE — Telephone Encounter (Signed)
Sent in tylenol with codeine

## 2021-07-07 ENCOUNTER — Ambulatory Visit (INDEPENDENT_AMBULATORY_CARE_PROVIDER_SITE_OTHER): Payer: Medicare Other | Admitting: Internal Medicine

## 2021-07-07 ENCOUNTER — Other Ambulatory Visit: Payer: Self-pay

## 2021-07-07 ENCOUNTER — Encounter: Payer: Self-pay | Admitting: Internal Medicine

## 2021-07-07 DIAGNOSIS — K649 Unspecified hemorrhoids: Secondary | ICD-10-CM | POA: Insufficient documentation

## 2021-07-07 DIAGNOSIS — K219 Gastro-esophageal reflux disease without esophagitis: Secondary | ICD-10-CM | POA: Diagnosis not present

## 2021-07-07 MED ORDER — ONDANSETRON HCL 4 MG PO TABS: 4.0000 mg | ORAL_TABLET | Freq: Three times a day (TID) | ORAL | 0 refills | Status: DC | PRN

## 2021-07-07 MED ORDER — DEXLANSOPRAZOLE 60 MG PO CPDR: 60.0000 mg | DELAYED_RELEASE_CAPSULE | Freq: Every day | ORAL | 3 refills | Status: DC

## 2021-07-07 MED ORDER — HYDROCORTISONE (PERIANAL) 2.5 % EX CREA: 1.0000 "application " | TOPICAL_CREAM | Freq: Two times a day (BID) | CUTANEOUS | 0 refills | Status: DC

## 2021-07-07 NOTE — Assessment & Plan Note (Addendum)
Has not been able to take Dexilant, she states that it is the only medication that works for her and does not want to take any other generic PPI Refilled Dexilant, she states that she will get it in the next week Doubt this is related to gastroenteritis at this point as symptoms are intermittent, reassured that even most gastroenteritis are self resolving and do not require antibiotics.

## 2021-07-07 NOTE — Progress Notes (Signed)
Virtual Visit via Telephone Note   This visit type was conducted due to national recommendations for restrictions regarding the COVID-19 Pandemic (e.g. social distancing) in an effort to limit this patient's exposure and mitigate transmission in our community.  Due to her co-morbid illnesses, this patient is at least at moderate risk for complications without adequate follow up.  This format is felt to be most appropriate for this patient at this time.  The patient did not have access to video technology/had technical difficulties with video requiring transitioning to audio format only (telephone).  All issues noted in this document were discussed and addressed.  No physical exam could be performed with this format.  Evaluation Performed:  Follow-up visit  Date:  07/07/2021   ID:  Jaclyn Brooks, Jaclyn Brooks 1960-11-08, MRN [id number]  Patient Location: Home Provider Location: Office/Clinic  Participants: Patient Location of Patient: Home Location of Provider: Telehealth Consent was obtain for visit to be over via telehealth. I verified that I am speaking with the correct person using two identifiers.  PCP:  Lindell Spar, MD   Chief Complaint:  Nausea and intermittent diarrhea  History of Present Illness:    Jaclyn Brooks is a 60 y.o. female who has a televisit for c/o nausea and intermittent diarrhea for last 1 week. She denies any vomiting, melena or hematochezia. Of note, she has not been able to take Dexilant as it was expensive. Her BM have been loose at time, but has regular BM at times. Denies any fever or chills.  She also reports having rectal pain and boils around the rectal area. Denies any bleeding currently.  The patient does not have symptoms concerning for COVID-19 infection (fever, chills, cough, or new shortness of breath).   Past Medical, Surgical, Social History, Allergies, and Medications have been Reviewed.  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   albuterol (VENTOLIN HFA) 108 (90 Base) MCG/ACT inhaler Inhale 1-2 puffs into the lungs every 6 (six) hours as needed for wheezing or shortness of breath.   allopurinol (ZYLOPRIM) 100 MG tablet Take 1 tablet (100 mg total) by mouth daily.   atenolol (TENORMIN) 50 MG tablet Take 1 tablet (50 mg total) by mouth daily.   budesonide-formoterol (SYMBICORT) 80-4.5 MCG/ACT inhaler Inhale 2 puffs into the lungs 2 (two) times daily.   CARTIA XT 240 MG 24 hr capsule Take 1 capsule (240 mg total) by mouth daily.   clopidogrel (PLAVIX) 75 MG tablet Take 1 tablet (75 mg total) by mouth every evening.   hydrochlorothiazide (HYDRODIURIL) 25 MG tablet Take 1 tablet by mouth once daily   hydrocortisone (  ANUSOL-HC) 2.5 % rectal cream Place 1 application rectally 2 (two) times daily.   methocarbamol (ROBAXIN) 500 MG tablet Take 1 tablet (500 mg total) by mouth 2 (two) times daily.   ondansetron (ZOFRAN) 4 MG tablet Take 1 tablet (4 mg total) by mouth every 8 (eight) hours as needed for nausea or vomiting.   [DISCONTINUED] dexlansoprazole (DEXILANT) 60 MG capsule Take 1 capsule (60 mg total) by mouth daily.     Allergies:   Bee venom, Nsaids, and Shellfish allergy   ROS:   Please see the history of present  illness.     All other systems reviewed and are negative.   Labs/Other Tests and Data Reviewed:    Recent Labs: 01/15/2021: Hemoglobin 13.3; Platelets 313 03/04/2021: ALT 30; BUN 32; Creat 1.34; Potassium CANCELED; Sodium 137   Recent Lipid Panel Lab Results  Component Value Date/Time   CHOL 157 06/19/2020 08:50 AM   TRIG 287 (H) 06/19/2020 08:50 AM   HDL 24 (L) 06/19/2020 08:50 AM   CHOLHDL 6.5 (H) 06/19/2020 08:50 AM   LDLCALC 92 06/19/2020 08:50 AM    Wt Readings from Last 3 Encounters:  06/24/21 171 lb (77.6 kg)  05/24/21 171 lb 0.6 oz (77.6 kg)  03/25/21 167 lb 6.4 oz (75.9 kg)     ASSESSMENT & PLAN:    GERD (gastroesophageal reflux disease) Has not been able to take Dexilant, she states that it is the only medication that works for her and does not want to take any other generic PPI Refilled Dexilant, she states that she will get it in the next week Doubt this is related to gastroenteritis at this point as symptoms are intermittent, reassured that even most gastroenteritis are self resolving and do not require antibiotics.  Hemorrhoids Rectal pain likely due to hemorrhoids Anusol cream PRN Advised to contact if persistent symptoms    Time:   Today, I have spent 16 minutes reviewing the chart, including problem list, medications, and with the patient with telehealth technology discussing the above problems.   Medication Adjustments/Labs and Tests Ordered: Current medicines are reviewed at length with the patient today.  Concerns regarding medicines are outlined above.   Tests Ordered: No orders of the defined types were placed in this encounter.   Medication Changes: Meds ordered this encounter  Medications   dexlansoprazole (DEXILANT) 60 MG capsule    Sig: Take 1 capsule (60 mg total) by mouth daily.    Dispense:  30 capsule    Refill:  3   ondansetron (ZOFRAN) 4 MG tablet    Sig: Take 1 tablet (4 mg total) by mouth every 8 (eight) hours as needed for  nausea or vomiting.    Dispense:  20 tablet    Refill:  0   hydrocortisone (ANUSOL-HC) 2.5 % rectal cream    Sig: Place 1 application rectally 2 (two) times daily.    Dispense:  30 g    Refill:  0     Note: This dictation was prepared with Dragon dictation along with smaller phrase technology. Similar sounding words can be transcribed inadequately or may not be corrected upon review. Any transcriptional errors that result from this process are unintentional.      Disposition:  Follow up  Signed, Lindell Spar, MD  07/07/2021 11:19 AM     [facility]

## 2021-07-07 NOTE — Patient Instructions (Signed)
Please take Dexilant for acid reflux.  Zofran for nausea. Avoid hot and spicy food.  Please apply Anusol cream around rectal area to help with rectal pain and swelling.

## 2021-07-07 NOTE — Assessment & Plan Note (Signed)
Rectal pain likely due to hemorrhoids Anusol cream PRN Advised to contact if persistent symptoms

## 2021-07-09 ENCOUNTER — Ambulatory Visit (INDEPENDENT_AMBULATORY_CARE_PROVIDER_SITE_OTHER): Payer: Medicare Other | Admitting: Surgical

## 2021-07-09 ENCOUNTER — Other Ambulatory Visit: Payer: Self-pay

## 2021-07-09 DIAGNOSIS — M25461 Effusion, right knee: Secondary | ICD-10-CM | POA: Diagnosis not present

## 2021-07-10 ENCOUNTER — Encounter: Payer: Self-pay | Admitting: Surgical

## 2021-07-10 NOTE — Progress Notes (Signed)
Office Visit Note   Patient: Jaclyn Brooks           Date of Birth: [birth year]           MRN: [id number] Visit Date: 07/09/2021 Requested by: Lindell Spar, MD [address] [place],  [place] [address] PCP: Lindell Spar, MD  Subjective: Chief Complaint  Patient presents with   Right Knee - Pain    HPI: DEMI TRIEU is a 60 y.o. female who presents to the office complaining of right knee pain and swelling.  Patient notes onset of swelling about 1 to 2 days ago.  She ate red meat 2 days ago and since the following morning, she has noticed difficulty weightbearing due to pain, increased swelling and redness as well as heat to the knee.  She has history of gout and has had multiple gout attacks this year.  She is currently on allopurinol 100 mg daily.  Denies any fevers, night sweats, malaise.  She does note occasional chills but nothing consistent.  She has history of CKD and cannot take NSAIDs.  He was last seen in 1 to 2 weeks ago for right elbow pain.  She had right elbow injection at the time and states that initially did not help her pain but now her elbow pain has resolved..                ROS: All systems reviewed are negative as they relate to the chief complaint within the history of present illness.  Patient denies fevers or chills.  Assessment & Plan: Visit Diagnoses:  1. Effusion, right knee     Plan: Patient is a 60 year old female who presents complaining of right knee pain over the last 1 to 2 days with associated swelling and warmth.  She has history of gout with multiple gout attacks this year.  Impression is acute gout attack as patient appears well and this does not seem to be septic arthritis.  She is on allopurinol but is low-dose at 100 mg daily.  She has not had her uric acid checked since starting allopurinol so plan to check this today to see if she needs to increase the dose of her allopurinol.  Also aspirated the right knee of 25 to 30 cc of yellow cloudy  synovial fluid.  No purulence was noted.  Cortisone injection administered and patient tolerated the procedure well.  This synovial fluid will be sent off for gram stain, cell count, crystal analysis, culture to confirm the diagnosis.  Main key will be getting her allopurinol to a therapeutic dose so that she does not have to go through these repeated gout attacks throughout the year.  Patient understands and agreed with plan.  I will contact her over the weekend with her lab results.  Called [facility] to check on lab results which showed uric acid of 7.8, cell count of 39,350 with 88% neutrophils, no crystals seen.  Gram stain showed no organisms but many PMNs.  No growth to date on the cultures.  Called patient on 07/11/2021 to discuss how she is doing and her clinical picture is improving.  She denied any systemic symptoms of infection such as fever, chills, night sweats.  Her knee pain is improved and she is able to weight-bear much more easily.  She does note some continued pain with weightbearing and knee motion but overall she feels she is trending better.  However, with lack of crystals, plan for patient to follow-up  on Wednesday for reaspiration and new synovial fluid analysis to see how it is trending.  Her clinical picture is behaving more like gout with her history as well as clinical improvement after injection as well as lack of organisms on Gram stain or culture.  Plan for her to come in on Wednesday afternoon for repeat aspiration and evaluation.  We can discuss increasing her dose of allopurinol to 200 mg/day with goal of uric acid below 6.0 at that appointment.  Follow-Up Instructions: No follow-ups on file.   Orders:  Orders Placed This Encounter  Procedures   Gram stain   Anaerobic and Aerobic Culture   Uric acid   Cell count + diff,  w/ cryst-synvl fld   Synovial Fluid Analysis, Complete   No orders of the defined types were placed in this encounter.      Procedures: Large Joint Inj: R knee on 07/09/2021 11:57 AM Indications: diagnostic evaluation, joint swelling and pain Details: 18 G 1.5 in needle, superolateral approach  Arthrogram: No  Medications: 5 mL lidocaine 1 %; 40 mg methylPREDNISolone acetate 40 MG/ML; 4 mL bupivacaine 0.25 % Aspirate: 25 mL yellow and cloudy Outcome: tolerated well, no immediate complications Procedure, treatment alternatives, risks and benefits explained, specific risks discussed. Consent was given by the patient. Immediately prior to procedure a time out was called to verify the correct patient, procedure, equipment, support staff and site/side marked as required. Patient was prepped and draped in the usual sterile fashion.      Clinical Data: No additional findings.  Objective: Vital Signs: There were no vitals taken for this visit.  Physical Exam:  Constitutional: Patient appears well-developed HEENT:  Head: Normocephalic Eyes:EOM are normal Neck: Normal range of motion Cardiovascular: Normal rate Pulmonary/chest: Effort normal Neurologic: Patient is alert Skin: Skin is warm Psychiatric: Patient has normal mood and affect  Ortho Exam: Ortho exam demonstrates right knee with positive effusion.  Increased warmth over the right knee compared to left.  Tenderness diffusely throughout the knee.  Patient appears well and does not appear sick.  No calf tenderness.  Negative Homans' sign.  Able to perform straight leg raise   Specialty Comments:  No specialty comments available.  Imaging: No results found.   PMFS History: Patient Active Problem List   Diagnosis Date Noted   Hemorrhoids 07/07/2021   Gout 03/25/2021   Diabetes mellitus without complication ([place]) 46/96/2952   Primary osteoarthritis of right foot 05/12/2020   GERD (gastroesophageal reflux disease) 05/31/2019   Solitary kidney, acquired 05/10/2019   Carotid arterial disease ([place]) 12/11/2018   COPD, mild ([place]) 10/01/2018   CKD  (chronic kidney disease) stage 3, GFR 30-59 ml/min ([facility]) 10/01/2018   Aortic valve sclerosis 10/01/2018   Hypertension 09/11/2018   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[place])     Family History  Problem Relation Age of Onset   Hypertension Mother    Heart disease Mother        Cardiac Arrest    Hypertension Father    Cancer Father    Hypertension Sister    Cancer Brother        Bladder Cancer    Hypertension Brother    Hypertension Sister     Past Surgical History:  Procedure  Laterality Date   ABDOMINAL HYSTERECTOMY     CHOLECYSTECTOMY N/A 01/28/2015   Procedure: LAPAROSCOPIC CHOLECYSTECTOMY;  Surgeon: Aviva Signs Md, MD;  Location: [facility];  Service: General;  Laterality: N/A;   excision of bone spurs Bilateral    Big toes   renal aneurysm Right    right coil-and only has function of left kidney   X-STOP IMPLANTATION     Social History   Occupational History   Occupation: Retired  Tobacco Use   Smoking status: Former    Packs/day: 0.25    Years: 20.00    Pack years: 5.00    Types: Cigarettes    Quit date: 2019    Years since quitting: 3.7   Smokeless tobacco: Never  Vaping Use   Vaping Use: Never used  Substance and Sexual Activity   Alcohol use: Yes    Comment: occasionally   Drug use: No   Sexual activity: Yes    Birth control/protection: Surgical

## 2021-07-11 ENCOUNTER — Encounter: Payer: Self-pay | Admitting: Surgical

## 2021-07-11 MED ORDER — LIDOCAINE HCL 1 % IJ SOLN
5.0000 mL | INTRAMUSCULAR | Status: AC | PRN
  Administered 2021-07-09: 5 mL

## 2021-07-11 MED ORDER — BUPIVACAINE HCL 0.25 % IJ SOLN
4.0000 mL | INTRAMUSCULAR | Status: AC | PRN
  Administered 2021-07-09: 4 mL via INTRA_ARTICULAR

## 2021-07-11 MED ORDER — METHYLPREDNISOLONE ACETATE 40 MG/ML IJ SUSP
40.0000 mg | INTRAMUSCULAR | Status: AC | PRN
  Administered 2021-07-09: 40 mg via INTRA_ARTICULAR

## 2021-07-12 ENCOUNTER — Ambulatory Visit: Payer: Medicare Other | Admitting: Orthopedic Surgery

## 2021-07-14 ENCOUNTER — Ambulatory Visit (INDEPENDENT_AMBULATORY_CARE_PROVIDER_SITE_OTHER): Payer: Medicare Other | Admitting: Orthopedic Surgery

## 2021-07-14 ENCOUNTER — Other Ambulatory Visit: Payer: Self-pay

## 2021-07-14 DIAGNOSIS — N179 Acute kidney failure, unspecified: Secondary | ICD-10-CM | POA: Diagnosis not present

## 2021-07-14 DIAGNOSIS — N2581 Secondary hyperparathyroidism of renal origin: Secondary | ICD-10-CM | POA: Diagnosis not present

## 2021-07-14 DIAGNOSIS — J449 Chronic obstructive pulmonary disease, unspecified: Secondary | ICD-10-CM | POA: Diagnosis not present

## 2021-07-14 DIAGNOSIS — M25461 Effusion, right knee: Secondary | ICD-10-CM | POA: Diagnosis not present

## 2021-07-14 DIAGNOSIS — N189 Chronic kidney disease, unspecified: Secondary | ICD-10-CM | POA: Diagnosis not present

## 2021-07-14 DIAGNOSIS — I358 Other nonrheumatic aortic valve disorders: Secondary | ICD-10-CM | POA: Diagnosis not present

## 2021-07-14 DIAGNOSIS — D631 Anemia in chronic kidney disease: Secondary | ICD-10-CM | POA: Diagnosis not present

## 2021-07-14 DIAGNOSIS — I129 Hypertensive chronic kidney disease with stage 1 through stage 4 chronic kidney disease, or unspecified chronic kidney disease: Secondary | ICD-10-CM | POA: Diagnosis not present

## 2021-07-14 DIAGNOSIS — N183 Chronic kidney disease, stage 3 unspecified: Secondary | ICD-10-CM | POA: Diagnosis not present

## 2021-07-15 LAB — ANAEROBIC AND AEROBIC CULTURE
AER RESULT:: NO GROWTH
MICRO NUMBER:: 12445871
MICRO NUMBER:: 12445872
SPECIMEN QUALITY:: ADEQUATE
SPECIMEN QUALITY:: ADEQUATE

## 2021-07-15 LAB — SYNOVIAL FLUID ANALYSIS, COMPLETE
Basophils, %: 0 %
Eosinophils-Synovial: 0 % (ref 0–2)
Lymphocytes-Synovial Fld: 3 % (ref 0–74)
Monocyte/Macrophage: 9 % (ref 0–69)
Neutrophil, Synovial: 88 % — ABNORMAL HIGH (ref 0–24)
Synoviocytes, %: 0 % (ref 0–15)
WBC, Synovial: 39350 cells/uL — ABNORMAL HIGH (ref <150)

## 2021-07-15 LAB — URIC ACID: Uric Acid, Serum: 7.8 mg/dL — ABNORMAL HIGH (ref 2.5–7.0)

## 2021-07-15 LAB — EXTRA LAV TOP TUBE

## 2021-07-21 ENCOUNTER — Ambulatory Visit (INDEPENDENT_AMBULATORY_CARE_PROVIDER_SITE_OTHER): Payer: Medicare Other | Admitting: Internal Medicine

## 2021-07-21 ENCOUNTER — Telehealth: Payer: Self-pay | Admitting: Orthopedic Surgery

## 2021-07-21 ENCOUNTER — Encounter: Payer: Self-pay | Admitting: Internal Medicine

## 2021-07-21 ENCOUNTER — Other Ambulatory Visit: Payer: Self-pay | Admitting: Internal Medicine

## 2021-07-21 ENCOUNTER — Other Ambulatory Visit: Payer: Self-pay

## 2021-07-21 DIAGNOSIS — M109 Gout, unspecified: Secondary | ICD-10-CM

## 2021-07-21 DIAGNOSIS — I10 Essential (primary) hypertension: Secondary | ICD-10-CM

## 2021-07-21 MED ORDER — PREDNISONE 20 MG PO TABS: 40.0000 mg | ORAL_TABLET | Freq: Every day | ORAL | 0 refills | Status: DC

## 2021-07-21 NOTE — Telephone Encounter (Signed)
Patient called. She would like to be seen tomorrow. Would like a call back. [phone] her ankle is swollen

## 2021-07-21 NOTE — Telephone Encounter (Signed)
Contacted patient and she has been scheduled to see luke on Friday 07/23/2021 at 1:00pm for ankle swelling. She states that she will contact our office back if anything changes with her pain.

## 2021-07-21 NOTE — Progress Notes (Signed)
Virtual Visit via Telephone Note   This visit type was conducted due to national recommendations for restrictions regarding the COVID-19 Pandemic (e.g. social distancing) in an effort to limit this patient's exposure and mitigate transmission in our community.  Due to her co-morbid illnesses, this patient is at least at moderate risk for complications without adequate follow up.  This format is felt to be most appropriate for this patient at this time.  The patient did not have access to video technology/had technical difficulties with video requiring transitioning to audio format only (telephone).  All issues noted in this document were discussed and addressed.  No physical exam could be performed with this format.  Evaluation Performed:  Follow-up visit  Date:  07/21/2021   ID:  Jaclyn Brooks, DOB [birth year], MRN [id number]  Patient Location: Home Provider Location: Office/Clinic  Participants: Patient Location of Patient: Home Location of Provider: Telehealth Consent was obtain for visit to be over via telehealth. I verified that I am speaking with the correct person using two identifiers.  PCP:  Lindell Spar, MD   Chief Complaint: Left ankle pain  History of Present Illness:    Jaclyn Brooks is a 60 y.o. female with a televisit for complaint of left ankle swelling and pain since yesterday.  She has a history of gout and has been taking allopurinol for it.  She denies any recent alcohol intake or any change in diet recently.  Denies any recent injury or insect bite.  She has been taking Tylenol arthritis for pain, which has helped minimally.  She also has been taking 2 tablets of allopurinol to help with the pain.  The patient does not have symptoms concerning for COVID-19 infection (fever, chills, cough, or new shortness of breath).   Past Medical, Surgical, Social History, Allergies, and Medications have been Reviewed.  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   albuterol (VENTOLIN HFA) 108 (90 Base) MCG/ACT inhaler Inhale 1-2 puffs into the lungs every 6 (six) hours as needed for wheezing or shortness of breath.   allopurinol (ZYLOPRIM) 100 MG tablet Take 1 tablet (100 mg total) by mouth daily.   atenolol (TENORMIN) 50 MG tablet Take 1 tablet (50 mg total) by mouth daily.   budesonide-formoterol (SYMBICORT) 80-4.5 MCG/ACT inhaler Inhale 2 puffs into the lungs 2 (two) times daily.   CARTIA XT 240 MG 24 hr capsule Take 1 capsule (240 mg total) by mouth daily.   clopidogrel (PLAVIX) 75 MG tablet Take 1 tablet (75 mg total) by mouth every evening.   dexlansoprazole (DEXILANT) 60 MG capsule Take 1 capsule (60 mg total)  by mouth daily.   hydrochlorothiazide (HYDRODIURIL) 25 MG tablet Take 1 tablet by mouth once daily   hydrocortisone (ANUSOL-HC) 2.5 % rectal cream Place 1 application rectally 2 (two) times daily.   methocarbamol (ROBAXIN) 500 MG tablet Take 1 tablet (500 mg total) by mouth 2 (two) times daily.   ondansetron (ZOFRAN) 4 MG tablet Take 1 tablet (4 mg total) by mouth every 8 (eight) hours as needed for nausea or vomiting.     Allergies:   Bee venom, Nsaids, and Shellfish allergy   ROS:   Please see the history of present illness.     All  other systems reviewed and are negative.   Labs/Other Tests and Data Reviewed:    Recent Labs: 01/15/2021: Hemoglobin 13.3; Platelets 313 03/04/2021: ALT 30; BUN 32; Creat 1.34; Potassium CANCELED; Sodium 137   Recent Lipid Panel Lab Results  Component Value Date/Time   CHOL 157 06/19/2020 08:50 AM   TRIG 287 (H) 06/19/2020 08:50 AM   HDL 24 (L) 06/19/2020 08:50 AM   CHOLHDL 6.5 (H) 06/19/2020 08:50 AM   LDLCALC 92 06/19/2020 08:50 AM    Wt Readings from Last 3 Encounters:  06/24/21 171 lb (77.6 kg)  05/24/21 171 lb 0.6 oz (77.6 kg)  03/25/21 167 lb 6.4 oz (75.9 kg)     ASSESSMENT & PLAN:    Acute gout flareup Left ankle swelling and pain likely due to gout Started prednisone 40 mg QD X 5 days Would avoid NSAIDs and colchicine due to CKD Continue allopurinol 100 mg daily, advised to avoid taking 2 tablets currently as increasing dose of allopurinol during acute flareup might worsen her pain/swelling  Time:   Today, I have spent 12 minutes reviewing the chart, including problem list, medications, and with the patient with telehealth technology discussing the above problems.   Medication Adjustments/Labs and Tests Ordered: Current medicines are reviewed at length with the patient today.  Concerns regarding medicines are outlined above.   Tests Ordered: No orders of the defined types were placed in this encounter.   Medication Changes: No orders of the defined types were placed in this encounter.    Note: This dictation was prepared with Dragon dictation along with smaller phrase technology. Similar sounding words can be transcribed inadequately or may not be corrected upon review. Any transcriptional errors that result from this process are unintentional.      Disposition:  Follow up  Signed, Lindell Spar, MD  07/21/2021 12:09 PM     [place]

## 2021-07-23 ENCOUNTER — Ambulatory Visit: Payer: Medicare Other | Admitting: Surgical

## 2021-07-27 ENCOUNTER — Encounter: Payer: Self-pay | Admitting: Orthopedic Surgery

## 2021-07-27 NOTE — Progress Notes (Signed)
Office Visit Note   Patient: Jaclyn Brooks           Date of Birth: 28-Feb-1961           MRN: [id number] Visit Date: 07/14/2021 Requested by: Lindell Spar, MD [address] [place],  [place] [address] PCP: Lindell Spar, MD  Subjective: Chief Complaint  Patient presents with   Right Knee - Pain, Follow-up    HPI: Patient presents for follow-up of right knee effusion as well as new onset of right elbow pain.  Patient has a history of gout.  Her knee aspiration at the end of September demonstrated high white count and no crystals.  Since that time she is actually done very well with the right knee.  Not reporting any fevers or chills.  Has developed some new onset right elbow pain.  Outside radiographs do not demonstrate any fracture or elbow joint effusion.              ROS: All systems reviewed are negative as they relate to the chief complaint within the history of present illness.  Patient denies  fevers or chills.   Assessment & Plan: Visit Diagnoses:  1. Effusion, right knee     Plan: Impression is significant improvement in right knee effusion with no effusion today and good range of motion.  No intervention indicated in the right knee at this time.  The right elbow has what may be gout affecting the insertion of the triceps tendon.  Do not see clear-cut calcific gouty tophus on radiographs but clinically I think it is possible she may have something in that region.  Discussed MRI scanning of the elbow to evaluate in the event that she is symptomatic enough to consider intervention.  Plan to see her back in 4 weeks.  Follow-Up Instructions: Return in about 4 weeks (around 08/11/2021).   Orders:  No orders of the defined types were placed in this encounter.  No orders of the defined types were placed in this encounter.     Procedures: No procedures performed   Clinical Data: No additional findings.  Objective: Vital Signs: There were no vitals taken for this  visit.  Physical Exam:   Constitutional: Patient appears well-developed HEENT:  Head: Normocephalic Eyes:EOM are normal Neck: Normal range of motion Cardiovascular: Normal rate Pulmonary/chest: Effort normal Neurologic: Patient is alert Skin: Skin is warm Psychiatric: Patient has normal mood and affect   Ortho Exam: Ortho exam demonstrates no effusion in the knee.  Has very good range of motion.  Knee itself is not warm.  No effusion in the left knee.  Right elbow is examined.  She has full range of motion.  No proximal lymphadenopathy.  Tender at the triceps insertion but no definite masses in this area.  Motor or sensory function to the hand is intact.  Pronation supination full.  Specialty Comments:  No specialty comments available.  Imaging: No results found.   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   CKD (chronic kidney disease) stage 3, GFR 30-59 ml/min ([facility]) 10/01/2018   Aortic valve sclerosis 10/01/2018   Hypertension 09/11/2018   Past Medical History:  Diagnosis  Date   Allergy    Asthma    Phreesia 11/25/2020   Chronic kidney disease    Phreesia 11/25/2020   Depression    Gout    Hyperlipidemia    Hypertension    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[place])     Family History  Problem Relation Age of Onset   Hypertension Mother    Heart disease Mother        Cardiac Arrest    Hypertension Father    Cancer Father    Hypertension Sister    Cancer Brother        Bladder Cancer    Hypertension Brother    Hypertension Sister     Past Surgical History:  Procedure Laterality Date   ABDOMINAL HYSTERECTOMY     CHOLECYSTECTOMY  N/A 01/28/2015   Procedure: LAPAROSCOPIC CHOLECYSTECTOMY;  Surgeon: Aviva Signs Md, MD;  Location: [facility];  Service: General;  Laterality: N/A;   excision of bone spurs Bilateral    Big toes   renal aneurysm Right    right coil-and only has function of left kidney   X-STOP IMPLANTATION     Social History   Occupational History   Occupation: Retired  Tobacco Use   Smoking status: Former    Packs/day: 0.25    Years: 20.00    Pack years: 5.00    Types: Cigarettes    Quit date: 2019    Years since quitting: 3.7   Smokeless tobacco: Never  Vaping Use   Vaping Use: Never used  Substance and Sexual Activity   Alcohol use: Yes    Comment: occasionally   Drug use: No   Sexual activity: Yes    Birth control/protection: Surgical

## 2021-07-28 ENCOUNTER — Ambulatory Visit: Payer: Medicare Other | Admitting: Orthopedic Surgery

## 2021-08-27 ENCOUNTER — Other Ambulatory Visit: Payer: Self-pay

## 2021-08-27 ENCOUNTER — Ambulatory Visit (INDEPENDENT_AMBULATORY_CARE_PROVIDER_SITE_OTHER): Payer: Medicare Other | Admitting: Family Medicine

## 2021-08-27 ENCOUNTER — Encounter: Payer: Self-pay | Admitting: Family Medicine

## 2021-08-27 VITALS — BP 144/69 | HR 51 | Resp 17 | Ht 66.0 in | Wt 172.1 lb

## 2021-08-27 DIAGNOSIS — J309 Allergic rhinitis, unspecified: Secondary | ICD-10-CM

## 2021-08-27 DIAGNOSIS — R110 Nausea: Secondary | ICD-10-CM | POA: Insufficient documentation

## 2021-08-27 DIAGNOSIS — K219 Gastro-esophageal reflux disease without esophagitis: Secondary | ICD-10-CM | POA: Diagnosis not present

## 2021-08-27 DIAGNOSIS — I10 Essential (primary) hypertension: Secondary | ICD-10-CM

## 2021-08-27 MED ORDER — ONDANSETRON HCL 4 MG PO TABS: 4.0000 mg | ORAL_TABLET | Freq: Three times a day (TID) | ORAL | 0 refills | Status: DC | PRN

## 2021-08-27 MED ORDER — PANTOPRAZOLE SODIUM 40 MG PO TBEC
40.0000 mg | DELAYED_RELEASE_TABLET | Freq: Every day | ORAL | 1 refills | Status: DC
Start: 2021-08-27 — End: 2022-03-09

## 2021-08-27 NOTE — Patient Instructions (Signed)
F/U with dr Posey Pronto as  before, call if you need sooner appointment   For reflux,pantoprazole is precribed and you are referred to GI  Medication is prescribed for as needed use for nausea  I recommend starting to use your allergy nose spray daily to reduce post nasal drainage  Keep an eye on your blood pressure at home, elevated in the office today   Thanks for choosing [facility], we consider it a privelige to serve you.

## 2021-08-27 NOTE — Progress Notes (Signed)
   Jaclyn Brooks     MRN: [id number]      DOB: [birth year]   HPI Jaclyn Brooks is here stating that yesterday she choked whiole trying to swallow a small pill, and coughed excessively till sputum/ expectorant was pink/blood tinged C/o epigastric burning/ discomfort, and has substernal discomfort arising from her stomach as well-   ROS Denies recent fever or chills. Denies sinus pressure,  ear pain or sore throat.c/o excess naal congestion and post nasal drainage Denies chest congestion, productive cough or wheezing. Denies chest pains, palpitations and leg swelling Denies vomiting,diarrhea or constipation.   Denies dysuria, frequency, hesitancy or incontinence. Denies joint pain, swelling and limitation in mobility. Denies headaches, seizures, numbness, or tingling. Denies depression, anxiety or insomnia. Denies skin break down or rash.   PE  BP (!) 144/69   Pulse (!) 51   Resp 17   Ht 5\' 6"  (1.676 m)   Wt 172 lb 1.9 oz (78.1 kg)   SpO2 98%   BMI 27.78 kg/m   Patient alert and oriented and in no cardiopulmonary distress.  HEENT: No facial asymmetry, EOMI,     Neck supple .  Chest: Clear to auscultation bilaterally.  CVS: S1, S2 no murmurs, no S3.Regular rate.  ABD: Soft mild epigastric tenderness, no organomegaly or mass, no guarding or rebound.   Ext: No edema  MS: Adequate ROM spine, shoulders, hips and knees.  Skin: Intact, no ulcerations or rash noted.  Psych: Good eye contact, normal affect. Memory intact not anxious or depressed appearing.  CNS: CN 2-12 intact, power,  normal throughout.no focal deficits noted.   Assessment & Plan  GERD (gastroesophageal reflux disease) Uncontrolled and symptomatic,trial of protonix andr efer to GI  Nausea Short , as needed course of zofran and refer to GI  Hypertension DASH diet and commitment to daily physical activity for a minimum of 30 minutes discussed and encouraged, as a part of hypertension management. The  importance of attaining a healthy weight is also discussed.  BP/Weight 08/27/2021 06/24/2021 05/24/2021 03/25/2021 03/04/2021 03/01/2021 3/54/5625  Systolic BP 638 937 342 876 811 572 620  Diastolic BP 69 68 76 82 76 65 79  Wt. (Lbs) 172.12 171 171.04 167.4 174 174 175  BMI 27.78 27.6 27.61 26.22 28.51 35.59 74.16   Systolic elevated on several occasions, follow up with PCP, not interested in med adjustment    Allergic rhinitis Uncontrolled, wih post nasal drainagerecommend regular use of allergy nasal spray

## 2021-08-27 NOTE — Assessment & Plan Note (Signed)
Uncontrolled and symptomatic,trial of protonix andr efer to GI

## 2021-08-29 ENCOUNTER — Encounter: Payer: Self-pay | Admitting: Family Medicine

## 2021-08-29 DIAGNOSIS — J309 Allergic rhinitis, unspecified: Secondary | ICD-10-CM | POA: Insufficient documentation

## 2021-08-29 NOTE — Assessment & Plan Note (Signed)
DASH diet and commitment to daily physical activity for a minimum of 30 minutes discussed and encouraged, as a part of hypertension management. The importance of attaining a healthy weight is also discussed.  BP/Weight 08/27/2021 06/24/2021 05/24/2021 03/25/2021 03/04/2021 03/01/2021 8/61/4830  Systolic BP 735 430 148 403 979 536 922  Diastolic BP 69 68 76 82 76 65 79  Wt. (Lbs) 172.12 171 171.04 167.4 174 174 175  BMI 27.78 27.6 27.61 26.22 28.51 30.09 79.49   Systolic elevated on several occasions, follow up with PCP, not interested in med adjustment

## 2021-08-29 NOTE — Assessment & Plan Note (Signed)
Short , as needed course of zofran and refer to GI

## 2021-08-29 NOTE — Assessment & Plan Note (Signed)
Uncontrolled, wih post nasal drainagerecommend regular use of allergy nasal spray

## 2021-09-03 ENCOUNTER — Other Ambulatory Visit: Payer: Self-pay

## 2021-09-03 DIAGNOSIS — K219 Gastro-esophageal reflux disease without esophagitis: Secondary | ICD-10-CM

## 2021-09-03 DIAGNOSIS — R110 Nausea: Secondary | ICD-10-CM

## 2021-09-06 ENCOUNTER — Encounter (INDEPENDENT_AMBULATORY_CARE_PROVIDER_SITE_OTHER): Payer: Self-pay | Admitting: *Deleted

## 2021-09-21 ENCOUNTER — Encounter: Payer: Self-pay | Admitting: Nurse Practitioner

## 2021-09-21 ENCOUNTER — Ambulatory Visit (INDEPENDENT_AMBULATORY_CARE_PROVIDER_SITE_OTHER): Payer: Medicare Other | Admitting: Nurse Practitioner

## 2021-09-21 ENCOUNTER — Other Ambulatory Visit: Payer: Self-pay

## 2021-09-21 ENCOUNTER — Ambulatory Visit: Payer: Medicare Other | Admitting: Nurse Practitioner

## 2021-09-21 DIAGNOSIS — J019 Acute sinusitis, unspecified: Secondary | ICD-10-CM | POA: Diagnosis not present

## 2021-09-21 DIAGNOSIS — J0111 Acute recurrent frontal sinusitis: Secondary | ICD-10-CM | POA: Insufficient documentation

## 2021-09-21 DIAGNOSIS — J0110 Acute frontal sinusitis, unspecified: Secondary | ICD-10-CM | POA: Insufficient documentation

## 2021-09-21 MED ORDER — AMOXICILLIN-POT CLAVULANATE 875-125 MG PO TABS: 1.0000 | ORAL_TABLET | Freq: Two times a day (BID) | ORAL | 0 refills | Status: DC

## 2021-09-21 MED ORDER — GUAIFENESIN-DM 100-10 MG/5ML PO SYRP: 5.0000 mL | ORAL_SOLUTION | ORAL | 0 refills | Status: DC | PRN

## 2021-09-21 NOTE — Progress Notes (Signed)
Acute Office Visit  Subjective:    Patient ID: Jaclyn Brooks, female    DOB: 05-14-1961, 60 y.o.   MRN: [id number]  Chief Complaint  Patient presents with   Sinusitis    Possible sinus infection,sore throat,no fever, dry cough     Sinusitis Associated symptoms include congestion, coughing, sinus pressure and a sore throat.  Patient is in today for sick visit. Symptom started in the middle of November. She had sinus issues at her appt on 08/27/21.    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[facility])     Past Surgical History:  Procedure Laterality Date   ABDOMINAL HYSTERECTOMY     CHOLECYSTECTOMY N/A 01/28/2015   Procedure: LAPAROSCOPIC CHOLECYSTECTOMY;  Surgeon: Aviva Signs Md, MD;  Location: [facility];  Service: General;  Laterality: N/A;   excision of bone spurs Bilateral    Big toes   renal aneurysm Right    right coil-and only has function of left kidney   X-STOP IMPLANTATION      Family History  Problem Relation Age of Onset   Hypertension Mother    Heart disease Mother        Cardiac Arrest    Hypertension Father    Cancer Father    Hypertension Sister    Cancer Brother        Bladder Cancer    Hypertension Brother    Hypertension Sister     Social History   Socioeconomic History   Marital status: Single    Spouse name: Not on file   Number of children: Not on file   Years of education: Not on file   Highest education level: Not on file  Occupational History   Occupation: Retired  Tobacco Use   Smoking status: Former    Packs/day: 0.25    Years: 20.00    Pack years: 5.00    Types: Cigarettes    Quit date: 2019    Years since quitting: 3.9   Smokeless tobacco: Never  Vaping Use   Vaping Use: Never used  Substance and Sexual Activity   Alcohol use: Yes    Comment:  occasionally   Drug use: No   Sexual activity: Yes    Birth control/protection: Surgical  Other Topics Concern   Not on file  Social History Narrative   Not on file   Social Determinants of Health   Financial Resource Strain: Not on file  Food Insecurity: Not on file  Transportation Needs: Not on file  Physical Activity: Not on file  Stress: Not on file  Social Connections: Not on file  Intimate Partner Violence: Not on file    Outpatient Medications Prior to Visit  Medication Sig Dispense Refill   acetaminophen-codeine (TYLENOL #3) 300-30 MG tablet Take 1 tablet by mouth daily as needed for moderate pain. 15 tablet 0   albuterol (VENTOLIN HFA) 108 (90 Base) MCG/ACT inhaler Inhale 1-2 puffs into the lungs every 6 (six) hours as needed for wheezing or shortness of breath. 1 each 2   allopurinol (ZYLOPRIM) 100 MG tablet Take 1 tablet (100 mg total) by mouth daily. 30 tablet 5   atenolol (TENORMIN) 50 MG tablet Take 1 tablet (50 mg total) by mouth daily. 90 tablet 1  budesonide-formoterol (SYMBICORT) 80-4.5 MCG/ACT inhaler Inhale 2 puffs into the lungs 2 (two) times daily. 1 each 2   CARTIA XT 240 MG 24 hr capsule Take 1 capsule by mouth once daily 90 capsule 0   clopidogrel (PLAVIX) 75 MG tablet Take 1 tablet (75 mg total) by mouth every evening. 90 tablet 3   hydrochlorothiazide (HYDRODIURIL) 25 MG tablet Take 1 tablet by mouth once daily 90 tablet 0   hydrocortisone (ANUSOL-HC) 2.5 % rectal cream Place 1 application rectally 2 (two) times daily. 30 g 0   methocarbamol (ROBAXIN) 500 MG tablet Take 1 tablet (500 mg total) by mouth 2 (two) times daily. 10 tablet 0   ondansetron (ZOFRAN) 4 MG tablet Take 1 tablet (4 mg total) by mouth every 8 (eight) hours as needed for nausea or vomiting. 10 tablet 0   pantoprazole (PROTONIX) 40 MG tablet Take 1 tablet (40 mg total) by mouth daily. 30 tablet 1   No facility-administered medications prior to visit.    Allergies  Allergen Reactions    Bee Venom Hives   Nsaids     Kidney disease    Shellfish Allergy Itching    Review of Systems  Constitutional:  Negative for fever.  HENT:  Positive for congestion, sinus pressure and sore throat.   Respiratory:  Positive for cough.       Objective:    Physical Exam  There were no vitals taken for this visit. Wt Readings from Last 3 Encounters:  08/27/21 172 lb 1.9 oz (78.1 kg)  06/24/21 171 lb (77.6 kg)  05/24/21 171 lb 0.6 oz (77.6 kg)    Health Maintenance Due  Topic Date Due   Pneumococcal Vaccine 17-10 Years old (1 - PCV) Never done   URINE MICROALBUMIN  Never done   COLONOSCOPY (Pts 45-14yrs Insurance coverage will need to be confirmed)  Never done   MAMMOGRAM  Never done   Zoster Vaccines- Shingrix (1 of 2) Never done   COVID-19 Vaccine (4 - Booster for Moderna series) 11/08/2020   HEMOGLOBIN A1C  12/17/2020   FOOT EXAM  08/29/2021    There are no preventive care reminders to display for this patient.   Lab Results  Component Value Date   TSH 2.43 06/19/2020   Lab Results  Component Value Date   WBC 8.4 01/15/2021   HGB 13.3 01/15/2021   HCT 39.3 01/15/2021   MCV 96.1 01/15/2021   PLT 313 01/15/2021   Lab Results  Component Value Date   NA 137 03/04/2021   K CANCELED 03/04/2021   CO2 24 03/04/2021   GLUCOSE 132 (H) 03/04/2021   BUN 32 (H) 03/04/2021   CREATININE 1.34 (H) 03/04/2021   BILITOT CANCELED 03/04/2021   ALKPHOS 60 01/26/2015   AST CANCELED 03/04/2021   ALT 30 (H) 03/04/2021   PROT 8.1 03/04/2021   ALBUMIN 4.0 01/26/2015   CALCIUM 10.5 (H) 03/04/2021   ANIONGAP 10 01/26/2015   Lab Results  Component Value Date   CHOL 157 06/19/2020   Lab Results  Component Value Date   HDL 24 (L) 06/19/2020   Lab Results  Component Value Date   LDLCALC 92 06/19/2020   Lab Results  Component Value Date   TRIG 287 (H) 06/19/2020   Lab Results  Component Value Date   CHOLHDL 6.5 (H) 06/19/2020   Lab Results  Component Value Date    HGBA1C 6.7 (H) 06/19/2020       Assessment & Plan:   Problem List Items  Addressed This Visit       Respiratory   Acute sinusitis - Primary    -Pt states she has had sinus issues for about a month -flu/COVID testing deferred d/t symptom onset being so long ago antivirals wouldn't be effetive -Rx. augmentin and robitussin-DM      Relevant Medications   amoxicillin-clavulanate (AUGMENTIN) 875-125 MG tablet   guaiFENesin-dextromethorphan (ROBITUSSIN DM) 100-10 MG/5ML syrup     Meds ordered this encounter  Medications   amoxicillin-clavulanate (AUGMENTIN) 875-125 MG tablet    Sig: Take 1 tablet by mouth 2 (two) times daily.    Dispense:  14 tablet    Refill:  0   guaiFENesin-dextromethorphan (ROBITUSSIN DM) 100-10 MG/5ML syrup    Sig: Take 5 mLs by mouth every 4 (four) hours as needed for cough.    Dispense:  118 mL    Refill:  0   Date:  09/21/2021   Location of Patient: Home Location of Provider: Office Consent was obtain for visit to be over via telehealth. I verified that I am speaking with the correct person using two identifiers.  I connected with  Jaclyn Brooks on 09/21/21 via telephone and verified that I am speaking with the correct person using two identifiers.   I discussed the limitations of evaluation and management by telemedicine. The patient expressed understanding and agreed to proceed.  Time spent: 5 minutes   Noreene Larsson, NP

## 2021-09-21 NOTE — Assessment & Plan Note (Signed)
-  Pt states she has had sinus issues for about a month -flu/COVID testing deferred d/t symptom onset being so long ago antivirals wouldn't be effetive -Rx. augmentin and robitussin-DM

## 2021-09-23 ENCOUNTER — Encounter: Payer: Medicare Other | Admitting: Internal Medicine

## 2021-10-18 ENCOUNTER — Other Ambulatory Visit: Payer: Self-pay | Admitting: *Deleted

## 2021-10-18 DIAGNOSIS — M1A0690 Idiopathic chronic gout, unspecified knee, without tophus (tophi): Secondary | ICD-10-CM

## 2021-10-18 DIAGNOSIS — I6523 Occlusion and stenosis of bilateral carotid arteries: Secondary | ICD-10-CM

## 2021-10-18 DIAGNOSIS — I10 Essential (primary) hypertension: Secondary | ICD-10-CM

## 2021-10-18 MED ORDER — HYDROCHLOROTHIAZIDE 25 MG PO TABS
25.0000 mg | ORAL_TABLET | Freq: Every day | ORAL | 0 refills | Status: DC
Start: 2021-10-18 — End: 2022-05-31

## 2021-10-18 MED ORDER — CLOPIDOGREL BISULFATE 75 MG PO TABS
75.0000 mg | ORAL_TABLET | Freq: Every evening | ORAL | 3 refills | Status: DC
Start: 2021-10-18 — End: 2021-12-06

## 2021-10-18 MED ORDER — ALLOPURINOL 100 MG PO TABS: 100.0000 mg | ORAL_TABLET | Freq: Every day | ORAL | 5 refills | Status: DC

## 2021-10-18 MED ORDER — CARTIA XT 240 MG PO CP24: 240.0000 mg | ORAL_CAPSULE | Freq: Every day | ORAL | 0 refills | Status: DC

## 2021-10-18 MED ORDER — ATENOLOL 50 MG PO TABS: 50.0000 mg | ORAL_TABLET | Freq: Every day | ORAL | 1 refills | Status: DC

## 2021-10-19 ENCOUNTER — Other Ambulatory Visit: Payer: Self-pay

## 2021-10-19 ENCOUNTER — Ambulatory Visit (INDEPENDENT_AMBULATORY_CARE_PROVIDER_SITE_OTHER): Payer: Medicare Other

## 2021-10-19 DIAGNOSIS — Z0000 Encounter for general adult medical examination without abnormal findings: Secondary | ICD-10-CM | POA: Diagnosis not present

## 2021-10-19 DIAGNOSIS — Z1211 Encounter for screening for malignant neoplasm of colon: Secondary | ICD-10-CM | POA: Diagnosis not present

## 2021-10-19 DIAGNOSIS — Z1231 Encounter for screening mammogram for malignant neoplasm of breast: Secondary | ICD-10-CM

## 2021-10-19 NOTE — Patient Instructions (Addendum)
[name] , Thank you for taking time to come for your Medicare Wellness Visit. I appreciate your ongoing commitment to your health goals. Please review the following plan we discussed and let me know if I can assist you in the future.   These are the goals we discussed:  Goals   None     This is a list of the screening recommended for you and due dates:  Health Maintenance  Topic Date Due   Pneumococcal Vaccination (1 - PCV) Never done   Urine Protein Check  Never done   Colon Cancer Screening  Never done   Mammogram  Never done   Zoster (Shingles) Vaccine (1 of 2) Never done   COVID-19 Vaccine (4 - Booster for Moderna series) 11/08/2020   Hemoglobin A1C  12/17/2020   Complete foot exam   08/29/2021   Eye exam for diabetics  04/20/2022   Hepatitis C Screening: USPSTF Recommendation to screen - Ages 18-79 yo.  Completed   HIV Screening  Completed   HPV Vaccine  Aged Out   Flu Shot  Discontinued   Tetanus Vaccine  Discontinued    Health Maintenance, Female Adopting a healthy lifestyle and getting preventive care are important in promoting health and wellness. Ask your health care provider about: The right schedule for you to have regular tests and exams. Things you can do on your own to prevent diseases and keep yourself healthy. What should I know about diet, weight, and exercise? Eat a healthy diet  Eat a diet that includes plenty of vegetables, fruits, low-fat dairy products, and lean protein. Do not eat a lot of foods that are high in solid fats, added sugars, or sodium. Maintain a healthy weight Body mass index (BMI) is used to identify weight problems. It estimates body fat based on height and weight. Your health care provider can help determine your BMI and help you achieve or maintain a healthy weight. Get regular exercise Get regular exercise. This is one of the most important things you can do for your health. Most adults should: Exercise for at least 150 minutes  each week. The exercise should increase your heart rate and make you sweat (moderate-intensity exercise). Do strengthening exercises at least twice a week. This is in addition to the moderate-intensity exercise. Spend less time sitting. Even light physical activity can be beneficial. Watch cholesterol and blood lipids Have your blood tested for lipids and cholesterol at 63 years of age, then have this test every 5 years. Have your cholesterol levels checked more often if: Your lipid or cholesterol levels are high. You are older than 63 years of age. You are at high risk for heart disease. What should I know about cancer screening? Depending on your health history and family history, you may need to have cancer screening at various ages. This may include screening for: Breast cancer. Cervical cancer. Colorectal cancer. Skin cancer. Lung cancer. What should I know about heart disease, diabetes, and high blood pressure? Blood pressure and heart disease High blood pressure causes heart disease and increases the risk of stroke. This is more likely to develop in people who have high blood pressure readings or are overweight. Have your blood pressure checked: Every 3-5 years if you are 50-26 years of age. Every year if you are 62 years old or older. Diabetes Have regular diabetes screenings. This checks your fasting blood sugar level. Have the screening done: Once every three years after age 64 if you are at a  normal weight and have a low risk for diabetes. More often and at a younger age if you are overweight or have a high risk for diabetes. What should I know about preventing infection? Hepatitis B If you have a higher risk for hepatitis B, you should be screened for this virus. Talk with your health care provider to find out if you are at risk for hepatitis B infection. Hepatitis C Testing is recommended for: Everyone born from [birth year] through 1967. Anyone with known risk factors for  hepatitis C. Sexually transmitted infections (STIs) Get screened for STIs, including gonorrhea and chlamydia, if: You are sexually active and are younger than 63 years of age. You are older than 63 years of age and your health care provider tells you that you are at risk for this type of infection. Your sexual activity has changed since you were last screened, and you are at increased risk for chlamydia or gonorrhea. Ask your health care provider if you are at risk. Ask your health care provider about whether you are at high risk for HIV. Your health care provider may recommend a prescription medicine to help prevent HIV infection. If you choose to take medicine to prevent HIV, you should first get tested for HIV. You should then be tested every 3 months for as long as you are taking the medicine. Pregnancy If you are about to stop having your period (premenopausal) and you may become pregnant, seek counseling before you get pregnant. Take 400 to 800 micrograms (mcg) of folic acid every day if you become pregnant. Ask for birth control (contraception) if you want to prevent pregnancy. Osteoporosis and menopause Osteoporosis is a disease in which the bones lose minerals and strength with aging. This can result in bone fractures. If you are 4 years old or older, or if you are at risk for osteoporosis and fractures, ask your health care provider if you should: Be screened for bone loss. Take a calcium or vitamin D supplement to lower your risk of fractures. Be given hormone replacement therapy (HRT) to treat symptoms of menopause. Follow these instructions at home: Alcohol use Do not drink alcohol if: Your health care provider tells you not to drink. You are pregnant, may be pregnant, or are planning to become pregnant. If you drink alcohol: Limit how much you have to: 0-1 drink a day. Know how much alcohol is in your drink. In the [place] one drink equals one 12 oz bottle of beer (355 mL), one 5  oz glass of wine (148 mL), or one 1 oz glass of hard liquor (44 mL). Lifestyle Do not use any products that contain nicotine or tobacco. These products include cigarettes, chewing tobacco, and vaping devices, such as e-cigarettes. If you need help quitting, ask your health care provider. Do not use street drugs. Do not share needles. Ask your health care provider for help if you need support or information about quitting drugs. General instructions Schedule regular health, dental, and eye exams. Stay current with your vaccines. Tell your health care provider if: You often feel depressed. You have ever been abused or do not feel safe at home. Summary Adopting a healthy lifestyle and getting preventive care are important in promoting health and wellness. Follow your health care provider's instructions about healthy diet, exercising, and getting tested or screened for diseases. Follow your health care provider's instructions on monitoring your cholesterol and blood pressure. This information is not intended to replace advice given to you by your health  care provider. Make sure you discuss any questions you have with your health care provider. Document Revised: 02/15/2021 Document Reviewed: 02/15/2021 Elsevier Patient Education  [place].

## 2021-10-19 NOTE — Progress Notes (Signed)
Subjective:   Jaclyn Brooks is a 61 y.o. female who presents for Medicare Annual (Subsequent) preventive examination. I connected with  Jaclyn Brooks on 10/19/21 by a audio enabled telemedicine application and verified that I am speaking with the correct person using two identifiers.  Patient Location: Home  Provider Location: Office/Clinic  I discussed the limitations of evaluation and management by telemedicine. The patient expressed understanding and agreed to proceed.  Review of Systems    Defer to PCP Cardiac Risk Factors include: hypertension;smoking/ tobacco exposure     Objective:    There were no vitals filed for this visit. There is no height or weight on file to calculate BMI.  Advanced Directives 10/19/2021 06/24/2021 03/01/2021 08/28/2020 01/24/2020 08/09/2019 09/11/2018  Does Patient Have a Medical Advance Directive? No No No No No No No  Would patient like information on creating a medical advance directive? Yes (ED - Information included in AVS) - - No - Patient declined No - Patient declined - No - Patient declined    Current Medications (verified) Outpatient Encounter Medications as of 10/19/2021  Medication Sig   acetaminophen-codeine (TYLENOL #3) 300-30 MG tablet Take 1 tablet by mouth daily as needed for moderate pain.   albuterol (VENTOLIN HFA) 108 (90 Base) MCG/ACT inhaler Inhale 1-2 puffs into the lungs every 6 (six) hours as needed for wheezing or shortness of breath.   allopurinol (ZYLOPRIM) 100 MG tablet Take 1 tablet (100 mg total) by mouth daily.   atenolol (TENORMIN) 50 MG tablet Take 1 tablet (50 mg total) by mouth daily.   budesonide-formoterol (SYMBICORT) 80-4.5 MCG/ACT inhaler Inhale 2 puffs into the lungs 2 (two) times daily.   CARTIA XT 240 MG 24 hr capsule Take 1 capsule (240 mg total) by mouth daily.   clopidogrel (PLAVIX) 75 MG tablet Take 1 tablet (75 mg total) by mouth every evening.   guaiFENesin-dextromethorphan (ROBITUSSIN DM) 100-10 MG/5ML  syrup Take 5 mLs by mouth every 4 (four) hours as needed for cough.   hydrochlorothiazide (HYDRODIURIL) 25 MG tablet Take 1 tablet (25 mg total) by mouth daily.   hydrocortisone (ANUSOL-HC) 2.5 % rectal cream Place 1 application rectally 2 (two) times daily.   ondansetron (ZOFRAN) 4 MG tablet Take 1 tablet (4 mg total) by mouth every 8 (eight) hours as needed for nausea or vomiting.   pantoprazole (PROTONIX) 40 MG tablet Take 1 tablet (40 mg total) by mouth daily.   methocarbamol (ROBAXIN) 500 MG tablet Take 1 tablet (500 mg total) by mouth 2 (two) times daily. (Patient not taking: Reported on 10/19/2021)   [DISCONTINUED] amoxicillin-clavulanate (AUGMENTIN) 875-125 MG tablet Take 1 tablet by mouth 2 (two) times daily.   [DISCONTINUED] atorvastatin (LIPITOR) 20 MG tablet Take 1 tablet (20 mg total) by mouth at bedtime. (Patient not taking: Reported on 01/24/2020)   No facility-administered encounter medications on file as of 10/19/2021.    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[facility])    Past Surgical History:  Procedure Laterality Date   ABDOMINAL HYSTERECTOMY     CHOLECYSTECTOMY N/A 01/28/2015   Procedure: LAPAROSCOPIC CHOLECYSTECTOMY;  Surgeon: Aviva Signs Md, MD;  Location: [facility];  Service: General;  Laterality:  N/A;   excision of bone spurs Bilateral    Big toes   renal aneurysm Right    right coil-and only has function of left kidney   X-STOP IMPLANTATION     Family History  Problem Relation Age of Onset   Hypertension Mother    Heart disease Mother        Cardiac Arrest    Hypertension Father    Cancer Father    Hypertension Sister    Cancer Brother        Bladder Cancer    Hypertension Brother    Hypertension Sister     Social History   Socioeconomic History   Marital status: Not on file    Spouse name: Not on file   Number of children: 1   Years of education: 12   Highest education level: Some college, no degree  Occupational History   Occupation: Retired  Tobacco Use   Smoking status: Former    Packs/day: 0.25    Years: 20.00    Pack years: 5.00    Types: Cigarettes    Quit date: 2019    Years since quitting: 4.0   Smokeless tobacco: Never  Vaping Use   Vaping Use: Never used  Substance and Sexual Activity   Alcohol use: Not Currently    Comment: occasionally   Drug use: No   Sexual activity: Yes    Birth control/protection: Surgical  Other Topics Concern   Not on file  Social History Narrative   Not on file   Social Determinants of Health   Financial Resource Strain: Low Risk    Difficulty of Paying Living Expenses: Not hard at all  Food Insecurity: No Food Insecurity   Worried About Charity fundraiser in the Last Year: Never true   [place] in the Last Year: Never true  Transportation Needs: No Transportation Needs   Lack of Transportation (Medical): No   Lack of Transportation (Non-Medical): No  Physical Activity: Sufficiently Active   Days of Exercise per Week: 5 days   Minutes of Exercise per Session: 30 min  Stress: No Stress Concern Present   Feeling of Stress : Not at all  Social Connections: Moderately Isolated   Frequency of Communication with Friends and Family: More than three times a week   Frequency of Social Gatherings with Friends and Family: More than three times a week   Attends Religious Services: More than 4 times per year   Active Member of [employer] or Organizations: No   Attends Music therapist: Never   Marital Status: Never married    Tobacco Counseling Counseling given: Not Answered   Clinical Intake:  Pre-visit preparation completed: No  Pain : No/denies pain     Nutritional Risks: None Diabetes: No  How often do  you need to have someone help you when you read instructions, pamphlets, or other written materials from your doctor or pharmacy?: 1 - Never What is the last grade level you completed in school?: 12  Diabetic? Nutrition Risk Assessment:  Has the patient had any N/V/D within the last 2 months?  No  Does the patient have any non-healing wounds?  No  Has the patient had any unintentional weight loss or weight gain?  No   Diabetes:  Is the patient diabetic?  Yes  If diabetic, was a CBG obtained today?  No  Did the patient bring in their glucometer from home?   N/a How often do you monitor  your CBG's? Pt states she doesn't check BS.   Financial Strains and Diabetes Management:  Are you having any financial strains with the device, your supplies or your medication? No .  Does the patient want to be seen by Chronic Care Management for management of their diabetes?  No  Would the patient like to be referred to a Nutritionist or for Diabetic Management?  No   Diabetic Exams:  Diabetic Eye Exam: Completed 04/20/2021 Diabetic Foot Exam: Overdue, Pt has been advised about the importance in completing this exam. Pt is scheduled for diabetic foot exam on next office visit.   Interpreter Needed?: No  Information entered by :: Tamsen Reist   Activities of Daily Living In your present state of health, do you have any difficulty performing the following activities: 10/19/2021  Hearing? N  Vision? N  Difficulty concentrating or making decisions? N  Walking or climbing stairs? N  Dressing or bathing? N  Doing errands, shopping? N  Preparing Food and eating ? N  Using the Toilet? N  In the past six months, have you accidently leaked urine? N  Do you have problems with loss of bowel control? N  Managing your Medications? N  Managing your Finances? N  Housekeeping or managing your Housekeeping? N  Some recent data might be hidden    Patient Care Team: Lindell Spar, MD as PCP - General  (Internal Medicine)  Indicate any recent Medical Services you may have received from other than [facility] providers in the past year (date may be approximate).     Assessment:   This is a routine wellness examination for Jaclyn Brooks.  Hearing/Vision screen No results found.  Dietary issues and exercise activities discussed: Current Exercise Habits: Home exercise routine, Type of exercise: walking, Time (Minutes): 30, Frequency (Times/Week): 5, Weekly Exercise (Minutes/Week): 150, Intensity: Moderate   Goals Addressed   None   Depression Screen PHQ 2/9 Scores 10/19/2021 09/21/2021 08/27/2021 07/21/2021 07/07/2021 05/24/2021 03/25/2021  PHQ - 2 Score 0 0 0 0 0 0 0  PHQ- 9 Score 0 - 0 - - - -    Fall Risk Fall Risk  10/19/2021 09/21/2021 08/27/2021 07/21/2021 07/07/2021  Falls in the past year? 0 0 0 0 0  Number falls in past yr: 0 0 0 0 0  Injury with Fall? 0 0 0 0 0  Risk for fall due to : No Fall Risks No Fall Risks - No Fall Risks No Fall Risks  Follow up Falls evaluation completed Falls evaluation completed - Falls evaluation completed Falls evaluation completed    FALL RISK PREVENTION PERTAINING TO THE HOME:  Any stairs in or around the home? Yes  If so, are there any without handrails? No  Home free of loose throw rugs in walkways, pet beds, electrical cords, etc? Yes  Adequate lighting in your home to reduce risk of falls? Yes   ASSISTIVE DEVICES UTILIZED TO PREVENT FALLS:  Life alert? No  Use of a cane, walker or w/c? No  Grab bars in the bathroom? No  Shower chair or bench in shower? No  Elevated toilet seat or a handicapped toilet? No      Cognitive Function:     6CIT Screen 10/19/2021  What Year? 0 points  What month? 0 points  What time? 0 points  Count back from 20 0 points  Months in reverse 0 points  Repeat phrase 0 points  Total Score 0    Immunizations Immunization History  Administered Date(s) Administered  Moderna Sars-Covid-2 Vaccination 12/27/2019,  01/28/2020, 09/13/2020    Tdap chart says discontinnued  Flu vaccine Charts says Discontinued  Pneumococcal vaccine status: Due, Education has been provided regarding the importance of this vaccine. Advised may receive this vaccine at local pharmacy or Health Dept. Aware to provide a copy of the vaccination record if obtained from local pharmacy or Health Dept. Verbalized acceptance and understanding.  Covid-19 vaccine status: Information provided on how to obtain vaccines.   Qualifies for Shingles Vaccine? Yes   Zostavax completed No   Shingrix Completed?: No.    Education has been provided regarding the importance of this vaccine. Patient has been advised to call insurance company to determine out of pocket expense if they have not yet received this vaccine. Advised may also receive vaccine at local pharmacy or Health Dept. Verbalized acceptance and understanding.  Screening Tests Health Maintenance  Topic Date Due   Pneumococcal Vaccine 53-73 Years old (1 - PCV) Never done   URINE MICROALBUMIN  Never done   COLONOSCOPY (Pts 45-47yrs Insurance coverage will need to be confirmed)  Never done   MAMMOGRAM  Never done   Zoster Vaccines- Shingrix (1 of 2) Never done   COVID-19 Vaccine (4 - Booster for Moderna series) 11/08/2020   HEMOGLOBIN A1C  12/17/2020   FOOT EXAM  08/29/2021   OPHTHALMOLOGY EXAM  04/20/2022   Hepatitis C Screening  Completed   HIV Screening  Completed   HPV VACCINES  Aged Out   INFLUENZA VACCINE  Discontinued   TETANUS/TDAP  Discontinued    Health Maintenance  Health Maintenance Due  Topic Date Due   Pneumococcal Vaccine 30-77 Years old (1 - PCV) Never done   URINE MICROALBUMIN  Never done   COLONOSCOPY (Pts 45-68yrs Insurance coverage will need to be confirmed)  Never done   MAMMOGRAM  Never done   Zoster Vaccines- Shingrix (1 of 2) Never done   COVID-19 Vaccine (4 - Booster for Moderna series) 11/08/2020   HEMOGLOBIN A1C  12/17/2020   FOOT EXAM   08/29/2021    Ordered a Cologaurd test  Mammogram status: Ordered 10/19/2021. Pt provided with contact info and advised to call to schedule appt.   Lung Cancer Screening: (Low Dose CT Chest recommended if Age 60-80 years, 30 pack-year currently smoking OR have quit w/in 15years.) does not qualify.   Lung Cancer Screening Referral: n/a  Additional Screening:  Hepatitis C Screening: does qualify; Completed 08/09/2019  Vision Screening: Recommended annual ophthalmology exams for early detection of glaucoma and other disorders of the eye. Is the patient up to date with their annual eye exam?  Yes  Who is the provider or what is the name of the office in which the patient attends annual eye exams? [facility] If pt is not established with a provider, would they like to be referred to a provider to establish care? No .   Dental Screening: Recommended annual dental exams for proper oral hygiene  Community Resource Referral / Chronic Care Management: CRR required this visit?  No   CCM required this visit?  No      Plan:     I have personally reviewed and noted the following in the patients chart:   Medical and social history Use of alcohol, tobacco or illicit drugs  Current medications and supplements including opioid prescriptions.  Functional ability and status Nutritional status Physical activity Advanced directives List of other physicians Hospitalizations, surgeries, and ER visits in previous 12 months Vitals Screenings  to include cognitive, depression, and falls Referrals and appointments  In addition, I have reviewed and discussed with patient certain preventive protocols, quality metrics, and best practice recommendations. A written personalized care plan for preventive services as well as general preventive health recommendations were provided to patient.     Earline Mayotte, [place]   10/19/2021   Nurse Notes:  Ms. Libman , Thank you for taking time to come for  your Medicare Wellness Visit. I appreciate your ongoing commitment to your health goals. Please review the following plan we discussed and let me know if I can assist you in the future.   These are the goals we discussed:  Goals   None     This is a list of the screening recommended for you and due dates:  Health Maintenance  Topic Date Due   Pneumococcal Vaccination (1 - PCV) Never done   Urine Protein Check  Never done   Colon Cancer Screening  Never done   Mammogram  Never done   Zoster (Shingles) Vaccine (1 of 2) Never done   COVID-19 Vaccine (4 - Booster for Moderna series) 11/08/2020   Hemoglobin A1C  12/17/2020   Complete foot exam   08/29/2021   Eye exam for diabetics  04/20/2022   Hepatitis C Screening: USPSTF Recommendation to screen - Ages 18-79 yo.  Completed   HIV Screening  Completed   HPV Vaccine  Aged Out   Flu Shot  Discontinued   Tetanus Vaccine  Discontinued

## 2021-10-21 ENCOUNTER — Other Ambulatory Visit: Payer: Self-pay | Admitting: *Deleted

## 2021-10-21 MED ORDER — BUDESONIDE-FORMOTEROL FUMARATE 80-4.5 MCG/ACT IN AERO: 2.0000 | INHALATION_SPRAY | Freq: Two times a day (BID) | RESPIRATORY_TRACT | 2 refills | Status: DC

## 2021-10-25 ENCOUNTER — Other Ambulatory Visit: Payer: Self-pay | Admitting: Internal Medicine

## 2021-10-25 ENCOUNTER — Telehealth: Payer: Self-pay

## 2021-10-25 DIAGNOSIS — I10 Essential (primary) hypertension: Secondary | ICD-10-CM

## 2021-10-25 NOTE — Telephone Encounter (Signed)
Jaclyn Brooks called from [facility] asking if they can give the generic brand instead of the Cartia 240mg .  Cartia 240 mg is unavailable.

## 2021-10-26 NOTE — Telephone Encounter (Signed)
Jaclyn Brooks with [facility] pharmacy notified generic ok to dispence

## 2021-10-28 ENCOUNTER — Other Ambulatory Visit: Payer: Self-pay | Admitting: Internal Medicine

## 2021-10-28 DIAGNOSIS — I10 Essential (primary) hypertension: Secondary | ICD-10-CM

## 2021-10-28 MED ORDER — AMLODIPINE BESYLATE 10 MG PO TABS: 10.0000 mg | ORAL_TABLET | Freq: Every day | ORAL | 0 refills | Status: DC

## 2021-10-28 NOTE — Telephone Encounter (Signed)
Pt notified with verbal understanding  °

## 2021-10-28 NOTE — Telephone Encounter (Signed)
Patient called said the pharmacy out of stock Cartia 240 mg. The Dilitzem (green and blue capsule) that was called into the pharmacy.  Patient said she can not take this capsule.  This one gives her too many side effects dizziness and off balance. Please have the nurse give patient call   Patinet said she only has 2 left after today.  Needs some type of blood pressure med called into her pharmacy:  [facility] [place]

## 2021-11-08 ENCOUNTER — Ambulatory Visit (INDEPENDENT_AMBULATORY_CARE_PROVIDER_SITE_OTHER): Payer: Medicare Other | Admitting: Surgical

## 2021-11-08 ENCOUNTER — Other Ambulatory Visit: Payer: Self-pay

## 2021-11-08 ENCOUNTER — Encounter: Payer: Self-pay | Admitting: Orthopedic Surgery

## 2021-11-08 DIAGNOSIS — M25461 Effusion, right knee: Secondary | ICD-10-CM | POA: Diagnosis not present

## 2021-11-08 MED ORDER — LIDOCAINE HCL 1 % IJ SOLN
5.0000 mL | INTRAMUSCULAR | Status: AC | PRN
  Administered 2021-11-08: 5 mL

## 2021-11-08 MED ORDER — METHYLPREDNISOLONE ACETATE 40 MG/ML IJ SUSP
40.0000 mg | INTRAMUSCULAR | Status: AC | PRN
  Administered 2021-11-08: 40 mg via INTRA_ARTICULAR

## 2021-11-08 MED ORDER — BUPIVACAINE HCL 0.25 % IJ SOLN
4.0000 mL | INTRAMUSCULAR | Status: AC | PRN
  Administered 2021-11-08: 4 mL via INTRA_ARTICULAR

## 2021-11-08 NOTE — Progress Notes (Signed)
Office Visit Note   Patient: Jaclyn Brooks           Date of Birth: October 27, 1960           MRN: [id number] Visit Date: 11/08/2021 Requested by: Lindell Spar, MD [address] [place],  [place] [address] PCP: Lindell Spar, MD  Subjective: Chief Complaint  Patient presents with   Right Knee - Follow-up    HPI: Jaclyn Brooks is a 61 y.o. female who presents to the office complaining of right knee pain.  Patient states that she first noticed right knee pain and swelling on Thursday.  No injury.  Feels similar to her previous episodes of acute gout attack.  She takes allopurinol 100 mg/day.  Denies any fevers, chills, night sweats.  Initially had a little bit of nausea without vomiting due to the pain but has not had any recently.  She is taking over-the-counter Tylenol for pain.  Cannot take NSAIDs due to her history of CKD.  No previous right knee surgeries..                ROS: All systems reviewed are negative as they relate to the chief complaint within the history of present illness.  Patient denies fevers or chills.  Assessment & Plan: Visit Diagnoses:  1. Effusion, right knee     Plan: Patient is a 60 year old female who presents for evaluation of right knee effusion.  She has history of gout with similar episodes of right knee pain and swelling in the past that have been positive for gouty crystals.  Based on her atraumatic onset of pain and swelling that is consistent with her prior episodes, plan to aspirate the right knee and inject with cortisone.  She tolerated the procedure well.  20 cc was aspirated from the right knee.  Follow-up with the office as needed.  If she has continued episodes of acute gout attack, could consider increasing her dose of allopurinol as we have discussed in the past.  Would need to titrate up slowly due to her CKD.  Follow-Up Instructions: No follow-ups on file.   Orders:  No orders of the defined types were placed in this encounter.  No orders  of the defined types were placed in this encounter.     Procedures: Large Joint Inj: R knee on 11/08/2021 5:24 PM Indications: diagnostic evaluation, joint swelling and pain Details: 18 G 1.5 in needle, superolateral approach  Arthrogram: No  Medications: 5 mL lidocaine 1 %; 40 mg methylPREDNISolone acetate 40 MG/ML; 4 mL bupivacaine 0.25 % Aspirate: 20 mL cloudy and yellow Outcome: tolerated well, no immediate complications Procedure, treatment alternatives, risks and benefits explained, specific risks discussed. Consent was given by the patient. Immediately prior to procedure a time out was called to verify the correct patient, procedure, equipment, support staff and site/side marked as required. Patient was prepped and draped in the usual sterile fashion.      Clinical Data: No additional findings.  Objective: Vital Signs: There were no vitals taken for this visit.  Physical Exam:  Constitutional: Patient appears well-developed HEENT:  Head: Normocephalic Eyes:EOM are normal Neck: Normal range of motion Cardiovascular: Normal rate Pulmonary/chest: Effort normal Neurologic: Patient is alert Skin: Skin is warm Psychiatric: Patient has normal mood and affect  Ortho Exam: Ortho exam demonstrates right knee with 0 degrees extension.  Flexes to 60 degrees before patient is in moderate to severe pain.  Positive effusion which is mild to moderate.  No calf tenderness.  Negative Homans' sign.  Patient is able to form straight leg raise with some pain.  Joint is warm slightly compared with the contralateral side but not hot.  There is no significant erythema noted.  Specialty Comments:  No specialty comments available.  Imaging: No results found.   PMFS History: Patient Active Problem List   Diagnosis Date Noted   Acute sinusitis 09/21/2021   Allergic rhinitis 08/29/2021   Nausea 08/27/2021   Hemorrhoids 07/07/2021   Gout 03/25/2021   Diabetes mellitus without  complication ([facility]) 32/67/1245   Primary osteoarthritis of right foot 05/12/2020   GERD (gastroesophageal reflux disease) 05/31/2019   Solitary kidney, acquired 05/10/2019   Carotid arterial disease ([facility]) 12/11/2018   COPD, mild ([facility]) 10/01/2018   CKD (chronic kidney disease) stage 3, GFR 30-59 ml/min (HCC) 10/01/2018   Aortic valve sclerosis 10/01/2018   Hypertension 09/11/2018   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[facility])     Family History  Problem Relation Age of Onset   Hypertension Mother    Heart disease Mother        Cardiac Arrest    Hypertension Father    Cancer Father    Hypertension Sister    Cancer Brother        Bladder Cancer    Hypertension Brother    Hypertension Sister     Past Surgical History:  Procedure Laterality Date   ABDOMINAL HYSTERECTOMY     CHOLECYSTECTOMY N/A 01/28/2015   Procedure: LAPAROSCOPIC CHOLECYSTECTOMY;  Surgeon: Aviva Signs Md, MD;  Location: [facility];  Service: General;  Laterality: N/A;   excision of bone spurs Bilateral    Big toes   renal aneurysm Right    right coil-and only has function of left kidney   X-STOP IMPLANTATION     Social History   Occupational History   Occupation: Retired  Tobacco Use   Smoking status: Former    Packs/day: 0.25    Years: 20.00    Pack years: 5.00    Types: Cigarettes    Quit date: 2019    Years since quitting: 4.0   Smokeless tobacco: Never  Vaping Use   Vaping Use: Never used  Substance and Sexual Activity   Alcohol use: Not Currently    Comment: occasionally   Drug use: No   Sexual activity: Yes    Birth control/protection: Surgical

## 2021-11-09 ENCOUNTER — Other Ambulatory Visit: Payer: Self-pay | Admitting: Internal Medicine

## 2021-11-09 DIAGNOSIS — I10 Essential (primary) hypertension: Secondary | ICD-10-CM

## 2021-11-11 ENCOUNTER — Ambulatory Visit (INDEPENDENT_AMBULATORY_CARE_PROVIDER_SITE_OTHER): Payer: Medicare Other | Admitting: Gastroenterology

## 2021-11-16 DIAGNOSIS — J449 Chronic obstructive pulmonary disease, unspecified: Secondary | ICD-10-CM | POA: Diagnosis not present

## 2021-11-16 DIAGNOSIS — D631 Anemia in chronic kidney disease: Secondary | ICD-10-CM | POA: Diagnosis not present

## 2021-11-16 DIAGNOSIS — I358 Other nonrheumatic aortic valve disorders: Secondary | ICD-10-CM | POA: Diagnosis not present

## 2021-11-16 DIAGNOSIS — N2581 Secondary hyperparathyroidism of renal origin: Secondary | ICD-10-CM | POA: Diagnosis not present

## 2021-11-16 DIAGNOSIS — N183 Chronic kidney disease, stage 3 unspecified: Secondary | ICD-10-CM | POA: Diagnosis not present

## 2021-11-16 DIAGNOSIS — N179 Acute kidney failure, unspecified: Secondary | ICD-10-CM | POA: Diagnosis not present

## 2021-11-16 DIAGNOSIS — I129 Hypertensive chronic kidney disease with stage 1 through stage 4 chronic kidney disease, or unspecified chronic kidney disease: Secondary | ICD-10-CM | POA: Diagnosis not present

## 2021-11-16 DIAGNOSIS — N189 Chronic kidney disease, unspecified: Secondary | ICD-10-CM | POA: Diagnosis not present

## 2021-11-18 ENCOUNTER — Encounter: Payer: Self-pay | Admitting: Internal Medicine

## 2021-11-18 ENCOUNTER — Ambulatory Visit (INDEPENDENT_AMBULATORY_CARE_PROVIDER_SITE_OTHER): Payer: Medicare Other | Admitting: Internal Medicine

## 2021-11-18 ENCOUNTER — Other Ambulatory Visit: Payer: Self-pay

## 2021-11-18 DIAGNOSIS — I10 Essential (primary) hypertension: Secondary | ICD-10-CM | POA: Diagnosis not present

## 2021-11-18 MED ORDER — AMLODIPINE BESYLATE 10 MG PO TABS: 10.0000 mg | ORAL_TABLET | Freq: Every day | ORAL | 0 refills | Status: DC

## 2021-11-18 NOTE — Progress Notes (Signed)
Virtual Visit via Telephone Note   This visit type was conducted due to national recommendations for restrictions regarding the COVID-19 Pandemic (e.g. social distancing) in an effort to limit this patient's exposure and mitigate transmission in our community.  Due to her co-morbid illnesses, this patient is at least at moderate risk for complications without adequate follow up.  This format is felt to be most appropriate for this patient at this time.  The patient did not have access to video technology/had technical difficulties with video requiring transitioning to audio format only (telephone).  All issues noted in this document were discussed and addressed.  No physical exam could be performed with this format.  Evaluation Performed:  Follow-up visit  Date:  11/18/2021   ID:  Jaclyn Brooks, Jaclyn Brooks 1961/10/09, MRN [id number]  Patient Location: Home Provider Location: Office/Clinic  Participants: Patient Location of Patient: Home Location of Provider: Telehealth Consent was obtain for visit to be over via telehealth. I verified that I am speaking with the correct person using two identifiers.  PCP:  Lindell Spar, MD   Chief Complaint: Dizziness and fatigue  History of Present Illness:    Jaclyn Brooks is a 61 y.o. female who has a televisit for c/o fatigue, dizziness and night sweats for last 3 weeks since she had to take generic Diltiazem. She had similar symptoms when she had to take it in the past instead of her brand Cartia.  She denies any fever, cough, weight loss, dysuria or hematuria currently.  The patient does not have symptoms concerning for COVID-19 infection (fever, chills, cough, or new shortness of breath).   Past Medical, Surgical, Social History, Allergies, and Medications have been Reviewed.  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   albuterol (VENTOLIN HFA) 108 (90 Base) MCG/ACT inhaler Inhale 1-2 puffs into the lungs every 6 (six) hours as needed for wheezing or shortness of breath.   allopurinol (ZYLOPRIM) 100 MG tablet Take 1 tablet (100 mg total) by mouth daily.   atenolol (TENORMIN) 50 MG tablet Take 1 tablet (50 mg total) by mouth daily.   budesonide-formoterol (SYMBICORT) 80-4.5 MCG/ACT inhaler Inhale 2 puffs into the lungs 2 (two) times daily.   CARTIA XT 240 MG 24 hr capsule Take 1 capsule (240 mg total) by mouth daily.   clopidogrel (PLAVIX) 75 MG tablet Take 1 tablet (75 mg total) by mouth every evening.   guaiFENesin-dextromethorphan (ROBITUSSIN DM) 100-10 MG/5ML syrup Take 5 mLs by mouth every 4 (four) hours as needed for cough.   hydrochlorothiazide (HYDRODIURIL) 25 MG tablet Take 1 tablet (25 mg total) by mouth daily.  hydrocortisone (ANUSOL-HC) 2.5 % rectal cream Place 1 application rectally 2 (two) times daily.   methocarbamol (ROBAXIN) 500 MG tablet Take 1 tablet (500 mg total) by mouth 2 (two) times daily.   ondansetron (ZOFRAN) 4 MG tablet Take 1 tablet (4 mg total) by mouth every 8 (eight) hours as needed for nausea or vomiting.   pantoprazole (PROTONIX) 40 MG tablet Take 1 tablet (40 mg total) by mouth daily.   [DISCONTINUED] amLODipine (NORVASC) 10 MG tablet Take 1 tablet (10 mg total) by mouth daily.      Allergies:   Bee venom, Nsaids, and Shellfish allergy   ROS:   Please see the history of present illness.     All other systems reviewed and are negative.   Labs/Other Tests and Data Reviewed:    Recent Labs: 01/15/2021: Hemoglobin 13.3; Platelets 313 03/04/2021: ALT 30; BUN 32; Creat 1.34; Potassium CANCELED; Sodium 137   Recent Lipid Panel Lab Results  Component Value Date/Time   CHOL 157 06/19/2020 08:50 AM   TRIG 287 (H) 06/19/2020 08:50 AM   HDL 24 (L) 06/19/2020 08:50 AM   CHOLHDL 6.5 (H) 06/19/2020 08:50 AM   LDLCALC 92 06/19/2020 08:50 AM    Wt Readings from Last 3 Encounters:  08/27/21 172 lb 1.9 oz (78.1 kg)  06/24/21 171 lb (77.6 kg)  05/24/21 171 lb 0.6 oz (77.6 kg)     ASSESSMENT & PLAN:    HTN As she does not tolerate generic diltiazem, she needs to get Cartia XT brand-name -called pharmacy and advised to dispense amlodipine instead until to get brand-name Cartia XT Advised patient to stop generic diltiazem for now Advised to maintain adequate hydration  Time:   Today, I have spent 13 minutes reviewing the chart, including problem list, medications, and with the patient with telehealth technology discussing the above problems.   Medication Adjustments/Labs and Tests Ordered: Current medicines are reviewed at length with the patient today.  Concerns regarding medicines are outlined above.   Tests Ordered: No orders of the defined types were placed in this encounter.   Medication Changes: Meds ordered this encounter  Medications   amLODipine (NORVASC) 10 MG tablet    Sig: Take 1 tablet (10 mg total) by mouth daily.    Dispense:  30 tablet    Refill:  0    Please cancel prescription of Verapamil.     Note: This dictation was prepared with Dragon dictation along with smaller phrase technology. Similar sounding words can be transcribed inadequately or may not be corrected upon review. Any transcriptional errors that result from this process are  unintentional.      Disposition:  Follow up  Signed, Lindell Spar, MD  11/18/2021 3:40 PM     [facility]

## 2021-11-18 NOTE — Patient Instructions (Signed)
Please take amlodipine until you get brand-name Cartia XT.

## 2021-11-22 ENCOUNTER — Ambulatory Visit: Payer: Medicare Other | Admitting: Internal Medicine

## 2021-11-30 ENCOUNTER — Encounter: Payer: Self-pay | Admitting: Internal Medicine

## 2021-11-30 ENCOUNTER — Other Ambulatory Visit: Payer: Self-pay

## 2021-11-30 ENCOUNTER — Ambulatory Visit (INDEPENDENT_AMBULATORY_CARE_PROVIDER_SITE_OTHER): Payer: Medicare Other | Admitting: Internal Medicine

## 2021-11-30 VITALS — BP 136/80 | HR 68 | Resp 18 | Ht 66.0 in | Wt 169.2 lb

## 2021-11-30 DIAGNOSIS — L720 Epidermal cyst: Secondary | ICD-10-CM

## 2021-11-30 DIAGNOSIS — J0110 Acute frontal sinusitis, unspecified: Secondary | ICD-10-CM | POA: Diagnosis not present

## 2021-11-30 DIAGNOSIS — Z0001 Encounter for general adult medical examination with abnormal findings: Secondary | ICD-10-CM | POA: Diagnosis not present

## 2021-11-30 DIAGNOSIS — N1832 Chronic kidney disease, stage 3b: Secondary | ICD-10-CM

## 2021-11-30 DIAGNOSIS — E119 Type 2 diabetes mellitus without complications: Secondary | ICD-10-CM

## 2021-11-30 DIAGNOSIS — I10 Essential (primary) hypertension: Secondary | ICD-10-CM

## 2021-11-30 LAB — POCT GLYCOSYLATED HEMOGLOBIN (HGB A1C)
HbA1c, POC (controlled diabetic range): 5.9 % (ref 0.0–7.0)
HbA1c, POC (prediabetic range): 5.9 % (ref 5.7–6.4)

## 2021-11-30 MED ORDER — AZITHROMYCIN 250 MG PO TABS
ORAL_TABLET | ORAL | 0 refills | Status: AC
Start: 2021-11-30 — End: 2021-12-05

## 2021-11-30 MED ORDER — SALINE SPRAY 0.65 % NA SOLN: 1.0000 | NASAL | 0 refills | Status: DC | PRN

## 2021-11-30 NOTE — Assessment & Plan Note (Signed)
BP Readings from Last 1 Encounters:  11/30/21 136/80   Well-controlled with Atenolol and Cartia Counseled for compliance with the medications Advised DASH diet and moderate exercise/walking, at least 150 mins/week

## 2021-11-30 NOTE — Patient Instructions (Signed)
Please continue taking medications as prescribed.  Please continue to follow low salt diet and ambulate as tolerated. 

## 2021-11-30 NOTE — Assessment & Plan Note (Signed)

## 2021-11-30 NOTE — Progress Notes (Signed)
Established Patient Office Visit  Subjective:  Patient ID: Jaclyn Brooks, female    DOB: [birth year]  Age: 61 y.o. MRN: [id number]  CC:  Chief Complaint  Patient presents with   Annual Exam    Annual exam pt has had sinus pressure in head and nose and shoulder and throat also has a cough no fever that she is aware of has spot on left arm at top would like to see about having removed     HPI Jaclyn Brooks is a 61 y.o. female with past medical history of HTN, carotid artery stenosis, COPD, GERD, prediabetes, OA, CKD stage 3b and renal artery aneurysm who presents for annual physical.  HTN: BP is well-controlled. Takes medications regularly. Patient denies headache, dizziness, chest pain, dyspnea or palpitations.   CKD: Follows up with Dr Justin Mend.  Denies any recent urinary hesitancy or resistance.  Denies any dysuria or hematuria currently.  She complains of having a cystlike structure on her left shoulder area, which has been causing pain recently.  Denies any recent injury.  She complains of nasal congestion and sinus pressure related headache with chest congestion for about a week now.  Denies any fever, chills currently.  Denies any recent sick contacts.  She denied Shingrix vaccine today.   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[facility])     Past Surgical History:  Procedure Laterality Date   ABDOMINAL HYSTERECTOMY     CHOLECYSTECTOMY N/A 01/28/2015   Procedure: LAPAROSCOPIC CHOLECYSTECTOMY;  Surgeon: Aviva Signs Md, MD;  Location: [facility];  Service: General;  Laterality: N/A;   excision of bone spurs Bilateral    Big toes   renal aneurysm Right    right coil-and only has function of left kidney   X-STOP IMPLANTATION      Family History  Problem Relation Age of Onset   Hypertension Mother     Heart disease Mother        Cardiac Arrest    Hypertension Father    Cancer Father    Hypertension Sister    Cancer Brother        Bladder Cancer    Hypertension Brother    Hypertension Sister     Social History   Socioeconomic History   Marital status: Unknown    Spouse name: Not on file   Number of children: 1   Years of education: 12   Highest education level: Some college, no degree  Occupational History   Occupation: Retired  Tobacco Use   Smoking status: Former    Packs/day: 0.25    Years: 20.00    Pack years: 5.00    Types: Cigarettes    Quit date: 2019    Years since quitting: 4.1   Smokeless tobacco: Never  Vaping Use   Vaping Use: Never used  Substance and Sexual Activity   Alcohol use: Not Currently    Comment: occasionally   Drug use: No   Sexual activity: Yes    Birth control/protection: Surgical  Other Topics Concern   Not on file  Social History Narrative   Not on file   Social Determinants of Health   Financial Resource Strain: Low Risk    Difficulty of Paying Living Expenses: Not hard at  all  Food Insecurity: No Food Insecurity   Worried About Charity fundraiser in the Last Year: Never true   Ran Out of Food in the Last Year: Never true  Transportation Needs: No Transportation Needs   Lack of Transportation (Medical): No   Lack of Transportation (Non-Medical): No  Physical Activity: Sufficiently Active   Days of Exercise per Week: 5 days   Minutes of Exercise per Session: 30 min  Stress: No Stress Concern Present   Feeling of Stress : Not at all  Social Connections: Moderately Isolated   Frequency of Communication with Friends and Family: More than three times a week   Frequency of Social Gatherings with Friends and Family: More than three times a week   Attends Religious Services: More than 4 times per year   Active Member of [employer] or Organizations: No   Attends Archivist Meetings: Never   Marital Status: Never married   Human resources officer Violence: Not At Risk   Fear of Current or Ex-Partner: No   Emotionally Abused: No   Physically Abused: No   Sexually Abused: No    Outpatient Medications Prior to Visit  Medication Sig Dispense Refill   acetaminophen-codeine (TYLENOL #3) 300-30 MG tablet Take 1 tablet by mouth daily as needed for moderate pain. 15 tablet 0   albuterol (VENTOLIN HFA) 108 (90 Base) MCG/ACT inhaler Inhale 1-2 puffs into the lungs every 6 (six) hours as needed for wheezing or shortness of breath. 1 each 2   allopurinol (ZYLOPRIM) 100 MG tablet Take 1 tablet (100 mg total) by mouth daily. 30 tablet 5   atenolol (TENORMIN) 50 MG tablet Take 1 tablet (50 mg total) by mouth daily. 90 tablet 1   budesonide-formoterol (SYMBICORT) 80-4.5 MCG/ACT inhaler Inhale 2 puffs into the lungs 2 (two) times daily. 1 each 2   CARTIA XT 240 MG 24 hr capsule Take 1 capsule (240 mg total) by mouth daily. 90 capsule 0   clopidogrel (PLAVIX) 75 MG tablet Take 1 tablet (75 mg total) by mouth every evening. 90 tablet 3   guaiFENesin-dextromethorphan (ROBITUSSIN DM) 100-10 MG/5ML syrup Take 5 mLs by mouth every 4 (four) hours as needed for cough. 118 mL 0   hydrochlorothiazide (HYDRODIURIL) 25 MG tablet Take 1 tablet (25 mg total) by mouth daily. 90 tablet 0   hydrocortisone (ANUSOL-HC) 2.5 % rectal cream Place 1 application rectally 2 (two) times daily. 30 g 0   methocarbamol (ROBAXIN) 500 MG tablet Take 1 tablet (500 mg total) by mouth 2 (two) times daily. 10 tablet 0   ondansetron (ZOFRAN) 4 MG tablet Take 1 tablet (4 mg total) by mouth every 8 (eight) hours as needed for nausea or vomiting. 10 tablet 0   pantoprazole (PROTONIX) 40 MG tablet Take 1 tablet (40 mg total) by mouth daily. 30 tablet 1   amLODipine (NORVASC) 10 MG tablet Take 1 tablet (10 mg total) by mouth daily. 30 tablet 0   No facility-administered medications prior to visit.    Allergies  Allergen Reactions   Bee Venom Hives   Nsaids     Kidney  disease    Shellfish Allergy Itching    ROS Review of Systems  Constitutional:  Negative for chills and fever.  HENT:  Positive for congestion and sinus pressure. Negative for sinus pain.   Eyes:  Negative for pain and discharge.  Respiratory:  Positive for cough. Negative for shortness of breath.   Cardiovascular:  Negative for chest pain and  palpitations.  Gastrointestinal:  Negative for abdominal pain, diarrhea and vomiting.  Endocrine: Negative for polydipsia and polyuria.  Genitourinary:  Negative for dysuria and hematuria.  Musculoskeletal:  Positive for arthralgias and back pain. Negative for neck pain and neck stiffness.  Skin:  Negative for rash.       Mass over left shoulder  Neurological:  Negative for dizziness and weakness.  Psychiatric/Behavioral:  Negative for agitation and behavioral problems.      Objective:    Physical Exam Vitals reviewed.  Constitutional:      General: She is not in acute distress.    Appearance: She is not diaphoretic.  HENT:     Head: Normocephalic and atraumatic.     Nose: Nose normal.     Mouth/Throat:     Mouth: Mucous membranes are moist.  Eyes:     General: No scleral icterus.    Extraocular Movements: Extraocular movements intact.  Cardiovascular:     Rate and Rhythm: Normal rate and regular rhythm.     Pulses: Normal pulses.     Heart sounds: Normal heart sounds. No murmur heard. Pulmonary:     Breath sounds: Normal breath sounds. No wheezing or rales.  Abdominal:     Palpations: Abdomen is soft.     Tenderness: There is no abdominal tenderness.  Musculoskeletal:     Cervical back: Neck supple. No tenderness.     Right lower leg: No edema.     Left lower leg: No edema.  Skin:    General: Skin is warm.     Findings: No rash.  Neurological:     General: No focal deficit present.     Mental Status: She is alert and oriented to person, place, and time.     Cranial Nerves: No cranial nerve deficit.     Sensory: No  sensory deficit.     Motor: No weakness.  Psychiatric:        Mood and Affect: Mood normal.        Behavior: Behavior normal.    BP 136/80 (BP Location: Left Arm, Patient Position: Sitting, Cuff Size: Normal)    Pulse 68    Resp 18    Ht 5\' 6"  (1.676 m)    Wt 169 lb 3.2 oz (76.7 kg)    SpO2 99%    BMI 27.31 kg/m  Wt Readings from Last 3 Encounters:  11/30/21 169 lb 3.2 oz (76.7 kg)  08/27/21 172 lb 1.9 oz (78.1 kg)  06/24/21 171 lb (77.6 kg)    Lab Results  Component Value Date   TSH 2.43 06/19/2020   Lab Results  Component Value Date   WBC 8.4 01/15/2021   HGB 13.3 01/15/2021   HCT 39.3 01/15/2021   MCV 96.1 01/15/2021   PLT 313 01/15/2021   Lab Results  Component Value Date   NA 137 03/04/2021   K CANCELED 03/04/2021   CO2 24 03/04/2021   GLUCOSE 132 (H) 03/04/2021   BUN 32 (H) 03/04/2021   CREATININE 1.34 (H) 03/04/2021   BILITOT CANCELED 03/04/2021   ALKPHOS 60 01/26/2015   AST CANCELED 03/04/2021   ALT 30 (H) 03/04/2021   PROT 8.1 03/04/2021   ALBUMIN 4.0 01/26/2015   CALCIUM 10.5 (H) 03/04/2021   ANIONGAP 10 01/26/2015   Lab Results  Component Value Date   CHOL 157 06/19/2020   Lab Results  Component Value Date   HDL 24 (L) 06/19/2020   Lab Results  Component Value Date   LDLCALC  92 06/19/2020   Lab Results  Component Value Date   TRIG 287 (H) 06/19/2020   Lab Results  Component Value Date   CHOLHDL 6.5 (H) 06/19/2020   Lab Results  Component Value Date   HGBA1C 6.7 (H) 06/19/2020      Assessment & Plan:   Encounter for general adult medical examination with abnormal findings Physical exam as documented. Counseling done  re healthy lifestyle involving commitment to 150 minutes exercise per week, heart healthy diet, and attaining healthy weight.The importance of adequate sleep also discussed. Changes in health habits are decided on by the patient with goals and time frames  set for achieving them. Immunization and cancer screening needs  are specifically addressed at this visit.  Epidermal cyst Likely subcutaneous cyst over left shoulder area Referred to general surgery for possible excision as she is having pain from it  Acute sinusitis Started empiric azithromycin Nasal saline spray as needed  Hypertension BP Readings from Last 1 Encounters:  11/30/21 136/80   Well-controlled with Atenolol and Cartia Counseled for compliance with the medications Advised DASH diet and moderate exercise/walking, at least 150 mins/week   Diabetes mellitus without complication ([facility]) Lab Results  Component Value Date   HGBA1C 6.7 (H) 06/19/2020   Diet controlled Denies to start medication for now  CKD (chronic kidney disease) stage 3, GFR 30-59 ml/min ([facility]) Last BMP reviewed in chart Follows up with Nephrologist - Dr. Justin Mend Avoid nephrotoxic agents     Meds ordered this encounter  Medications   azithromycin (ZITHROMAX) 250 MG tablet    Sig: Take 2 tablets on day 1, then 1 tablet daily on days 2 through 5    Dispense:  6 tablet    Refill:  0   sodium chloride (OCEAN) 0.65 % SOLN nasal spray    Sig: Place 1 spray into both nostrils as needed for congestion.    Dispense:  15 mL    Refill:  0    Follow-up: Return in about 6 months (around 05/30/2022) for HTN and DM.    Lindell Spar, MD

## 2021-11-30 NOTE — Addendum Note (Signed)
Addended by: Zacarias Pontes R on: 11/30/2021 02:56 PM   Modules accepted: Orders

## 2021-11-30 NOTE — Assessment & Plan Note (Signed)
Last BMP reviewed in chart Follows up with Nephrologist - Dr. Webb Avoid nephrotoxic agents 

## 2021-11-30 NOTE — Assessment & Plan Note (Signed)
Likely subcutaneous cyst over left shoulder area Referred to general surgery for possible excision as she is having pain from it

## 2021-11-30 NOTE — Assessment & Plan Note (Signed)
Started empiric azithromycin Nasal saline spray as needed

## 2021-11-30 NOTE — Assessment & Plan Note (Signed)
Lab Results  Component Value Date   HGBA1C 6.7 (H) 06/19/2020   Diet controlled Denies to start medication for now

## 2021-12-02 ENCOUNTER — Encounter: Payer: Self-pay | Admitting: *Deleted

## 2021-12-02 ENCOUNTER — Ambulatory Visit (INDEPENDENT_AMBULATORY_CARE_PROVIDER_SITE_OTHER): Payer: Medicare Other | Admitting: Surgery

## 2021-12-02 ENCOUNTER — Ambulatory Visit: Payer: Medicare Other

## 2021-12-02 ENCOUNTER — Other Ambulatory Visit: Payer: Self-pay

## 2021-12-02 ENCOUNTER — Telehealth: Payer: Self-pay | Admitting: Orthopedic Surgery

## 2021-12-02 ENCOUNTER — Encounter: Payer: Self-pay | Admitting: Surgery

## 2021-12-02 DIAGNOSIS — Z2821 Immunization not carried out because of patient refusal: Secondary | ICD-10-CM | POA: Insufficient documentation

## 2021-12-02 DIAGNOSIS — G8929 Other chronic pain: Secondary | ICD-10-CM

## 2021-12-02 DIAGNOSIS — M25561 Pain in right knee: Secondary | ICD-10-CM

## 2021-12-02 LAB — MICROALBUMIN, URINE: Microalbumin, Urine: 759.4 ug/mL

## 2021-12-02 NOTE — Telephone Encounter (Signed)
See other note for further documentation

## 2021-12-02 NOTE — Telephone Encounter (Signed)
I called advised Dr Marlou Sa and Lurena Joiner not in the office today but in surgery-She will see Jeneen Rinks this afternoon

## 2021-12-02 NOTE — Progress Notes (Signed)
61 year old black female who is a patient of Dr. Randel Pigg comes in for chronic right knee pain and swelling.  Patient last seen in the clinic by [facility] November 08, 2021 and had right knee aspiration and injection performed with cortisone.  She also had right knee injections performed in June and September 2022.  Patient stated that Lurena Joiner did do something right with the most recent less than a month ago because her knee still continues to hurt.  I did get x-rays right knee today which show tricompartmental degenerative changes with periarticular spurs.  Patellofemoral bone-on-bone.   Plan Patient was wanting a repeat injection today but I advised her that I cannot do this since she just had it done less than a month ago.  She has also failed conservative management with repeated injections in the past.  I told her that ultimately it would likely come down her needing definitive treatment with right total knee replacement.  She said that if I was not going to give her an injection today that I could give her a prednisone taper.  She stated that the last taper that was given was about 3 months ago.  I advised her that I would not repeat the taper for her right knee issue.  Patient ended up leaving the office without exam and said that she would make follow-up appointment with Dr. Marlou Sa.

## 2021-12-02 NOTE — Telephone Encounter (Signed)
This pt called stating Dr. Marlou Sa stated to her she can come in if she still in pain. Dr. Marlou Sa nor PA Lurena Joiner has no opening. I explained to pt there is no openings and I can send a message. Pt state she will show up without appt to see either. I was unable to explain to her we dont have walk ins. She did not let me speak and hung up. Please call pt about this matter at [phone].

## 2021-12-02 NOTE — Telephone Encounter (Signed)
Pt called back and apologized for the last phone message. She states she only doing what the dr instructed her today and would really would like to be seen today. She states she can barely walk on her knee. Please call pt bout this message. Pt phone number is [phone]

## 2021-12-03 ENCOUNTER — Other Ambulatory Visit: Payer: Self-pay | Admitting: Surgical

## 2021-12-03 ENCOUNTER — Other Ambulatory Visit: Payer: Self-pay | Admitting: Internal Medicine

## 2021-12-03 DIAGNOSIS — I6523 Occlusion and stenosis of bilateral carotid arteries: Secondary | ICD-10-CM

## 2021-12-03 MED ORDER — ACETAMINOPHEN-CODEINE #3 300-30 MG PO TABS: 1.0000 | ORAL_TABLET | Freq: Four times a day (QID) | ORAL | 0 refills | Status: DC | PRN

## 2021-12-03 MED ORDER — ACETAMINOPHEN-CODEINE #3 300-30 MG PO TABS
1.0000 | ORAL_TABLET | Freq: Every evening | ORAL | 0 refills | Status: DC | PRN
Start: 2021-12-03 — End: 2022-03-09

## 2021-12-06 ENCOUNTER — Other Ambulatory Visit: Payer: Self-pay

## 2021-12-06 ENCOUNTER — Telehealth: Payer: Self-pay | Admitting: Surgical

## 2021-12-06 ENCOUNTER — Ambulatory Visit (INDEPENDENT_AMBULATORY_CARE_PROVIDER_SITE_OTHER): Payer: Medicare Other | Admitting: Orthopedic Surgery

## 2021-12-06 ENCOUNTER — Encounter: Payer: Self-pay | Admitting: Orthopedic Surgery

## 2021-12-06 DIAGNOSIS — G8929 Other chronic pain: Secondary | ICD-10-CM | POA: Diagnosis not present

## 2021-12-06 DIAGNOSIS — M25461 Effusion, right knee: Secondary | ICD-10-CM

## 2021-12-06 DIAGNOSIS — M25561 Pain in right knee: Secondary | ICD-10-CM | POA: Diagnosis not present

## 2021-12-06 MED ORDER — BUPIVACAINE HCL 0.25 % IJ SOLN
4.0000 mL | INTRAMUSCULAR | Status: AC | PRN
  Administered 2021-12-06: 4 mL via INTRA_ARTICULAR

## 2021-12-06 MED ORDER — LIDOCAINE HCL 1 % IJ SOLN
5.0000 mL | INTRAMUSCULAR | Status: AC | PRN
  Administered 2021-12-06: 5 mL

## 2021-12-06 NOTE — Telephone Encounter (Signed)
Patient worked in for today at 1245-she will see Lurena Joiner

## 2021-12-06 NOTE — Progress Notes (Signed)
Office Visit Note   Patient: Jaclyn Brooks           Date of Birth: 07-18-1961           MRN: [id number] Visit Date: 12/06/2021 Requested by: Lindell Spar, MD [address] [place],  [place] [address] PCP: Lindell Spar, MD  Subjective: Chief Complaint  Patient presents with   Right Knee - Pain    HPI: Jaclyn Brooks is a 61 y.o. female who presents to the office complaining of right knee pain.  She states that she has acute flaring up of right knee pain that began last Wednesday.  No history of injury.  She does have history of gout that has been confirmed with synovial fluid analyses on previous aspirations.  She denies any groin pain or any radicular pain down the leg.  Mostly localizes pain diffusely to the anterior aspect of the knee with new swelling.  She does take allopurinol.  Cannot take any NSAIDs due to her history of CKD.  She does state that about a week ago prior to the onset of her acute pain, she was not really having any significant knee pain or any knee symptoms.  She does have previous radiographs from her visit last week with Benjiman Core, PA-C that demonstrated moderate degenerative changes throughout all 3 compartments of the knee.                ROS: All systems reviewed are negative as they relate to the chief complaint within the history of present illness.  Patient denies fevers or chills.  Assessment & Plan: Visit Diagnoses:  1. Chronic pain of right knee   2. Effusion, right knee     Plan: Impression is relatively acute onset right knee effusion.  Could be gout.  Moderate degenerative changes present throughout the right knee.  Aspiration and injection of Toradol performed today.  Plan to send the aspirate for Gram stain cell count aerobic and anaerobic culture as well as crystals.  We will call her with those results.  Follow-Up Instructions: No follow-ups on file.   Orders:  Orders Placed This Encounter  Procedures   Synovial Fluid Analysis,  Complete   No orders of the defined types were placed in this encounter.     Procedures: Large Joint Inj: R knee on 12/06/2021 6:07 PM Indications: diagnostic evaluation, joint swelling and pain Details: 18 G 1.5 in needle, superolateral approach  Arthrogram: No  Medications: 5 mL lidocaine 1 %; 4 mL bupivacaine 0.25 % Aspirate: cloudy; sent for lab analysis Outcome: tolerated well, no immediate complications Procedure, treatment alternatives, risks and benefits explained, specific risks discussed. Consent was given by the patient. Immediately prior to procedure a time out was called to verify the correct patient, procedure, equipment, support staff and site/side marked as required. Patient was prepped and draped in the usual sterile fashion.    All injected  Clinical Data: No additional findings.  Objective: Vital Signs: There were no vitals taken for this visit.  Physical Exam:  Constitutional: Patient appears well-developed HEENT:  Head: Normocephalic Eyes:EOM are normal Neck: Normal range of motion Cardiovascular: Normal rate Pulmonary/chest: Effort normal Neurologic: Patient is alert Skin: Skin is warm Psychiatric: Patient has normal mood and affect  Ortho Exam: Ortho exam demonstrates right knee with 0 degrees extension and greater than 90 degrees knee flexion.  Tenderness primarily over the lateral joint line.  Not really much in the way of tenderness over the medial  joint line.  There is increased warmth over the anterior aspect of the right knee compared with the left.  She is able to perform straight leg raise.  Specialty Comments:  No specialty comments available.  Imaging: No results found.   PMFS History: Patient Active Problem List   Diagnosis Date Noted   Refused influenza vaccine 12/02/2021   Encounter for general adult medical examination with abnormal findings 11/30/2021   Epidermal cyst 11/30/2021   Acute sinusitis 09/21/2021   Allergic  rhinitis 08/29/2021   Nausea 08/27/2021   Hemorrhoids 07/07/2021   Gout 03/25/2021   Diabetes mellitus without complication (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[place])     Family History  Problem Relation Age of Onset   Hypertension Mother    Heart disease Mother        Cardiac Arrest    Hypertension Father    Cancer Father    Hypertension Sister    Cancer Brother        Bladder Cancer    Hypertension Brother    Hypertension Sister     Past Surgical History:  Procedure Laterality Date   ABDOMINAL HYSTERECTOMY     CHOLECYSTECTOMY N/A 01/28/2015   Procedure: LAPAROSCOPIC CHOLECYSTECTOMY;  Surgeon: Aviva Signs Md, MD;  Location: [facility];  Service: General;  Laterality: N/A;   excision of bone spurs Bilateral    Big toes   renal aneurysm Right    right coil-and only has function of left kidney   X-STOP IMPLANTATION     Social History   Occupational History   Occupation: Retired  Tobacco Use   Smoking status: Former    Packs/day: 0.25    Years: 20.00    Pack years: 5.00    Types: Cigarettes    Quit date: 2019    Years since quitting: 4.1   Smokeless tobacco: Never  Vaping Use   Vaping Use: Never used  Substance and Sexual Activity   Alcohol use: Not Currently    Comment: occasionally   Drug use: No   Sexual activity: Yes    Birth control/protection: Surgical

## 2021-12-06 NOTE — Telephone Encounter (Signed)
Pt called requesting a call back. Pt states she spoke with PA Lurena Joiner on Friday and he sent in medication for her and she state PA Lurena Joiner said if the medication did not work that he or Dr. Marlou Sa would work her in Monday 12/06/21. Please call pt about this matter for an app today. Pt phone number is [phone].

## 2021-12-07 ENCOUNTER — Other Ambulatory Visit: Payer: Self-pay | Admitting: Internal Medicine

## 2021-12-07 ENCOUNTER — Ambulatory Visit: Payer: Medicare Other | Admitting: General Surgery

## 2021-12-07 DIAGNOSIS — M1A0690 Idiopathic chronic gout, unspecified knee, without tophus (tophi): Secondary | ICD-10-CM

## 2021-12-07 LAB — SYNOVIAL FLUID ANALYSIS, COMPLETE
Basophils, %: 0 %
Eosinophils-Synovial: 0 % (ref 0–2)
Lymphocytes-Synovial Fld: 2 % (ref 0–74)
Monocyte/Macrophage: 15 % (ref 0–69)
Neutrophil, Synovial: 83 % — ABNORMAL HIGH (ref 0–24)
Synoviocytes, %: 0 % (ref 0–15)
WBC, Synovial: 23480 cells/uL — ABNORMAL HIGH (ref <150)

## 2021-12-07 LAB — TIQ-NTM

## 2021-12-07 MED ORDER — ALLOPURINOL 200 MG PO TABS: 200.0000 mg | ORAL_TABLET | Freq: Every day | ORAL | 5 refills | Status: DC

## 2021-12-07 MED ORDER — ALLOPURINOL 100 MG PO TABS: 200.0000 mg | ORAL_TABLET | Freq: Every day | ORAL | 5 refills | Status: DC

## 2021-12-07 NOTE — Addendum Note (Signed)
Addended byIhor Dow on: 12/07/2021 04:51 PM   Modules accepted: Orders

## 2021-12-09 ENCOUNTER — Ambulatory Visit (INDEPENDENT_AMBULATORY_CARE_PROVIDER_SITE_OTHER): Payer: Medicare Other | Admitting: Gastroenterology

## 2021-12-09 ENCOUNTER — Ambulatory Visit: Payer: Medicare Other | Admitting: Orthopedic Surgery

## 2021-12-13 LAB — ANAEROBIC AND AEROBIC CULTURE
AER RESULT:: NO GROWTH
MICRO NUMBER:: 13061143
MICRO NUMBER:: 13061144
SPECIMEN QUALITY:: ADEQUATE
SPECIMEN QUALITY:: ADEQUATE

## 2021-12-14 ENCOUNTER — Ambulatory Visit: Payer: Medicare Other | Admitting: General Surgery

## 2021-12-16 ENCOUNTER — Ambulatory Visit (INDEPENDENT_AMBULATORY_CARE_PROVIDER_SITE_OTHER): Payer: Medicare Other | Admitting: Gastroenterology

## 2021-12-22 ENCOUNTER — Ambulatory Visit: Payer: Medicare Other | Admitting: General Surgery

## 2021-12-28 ENCOUNTER — Telehealth: Payer: Self-pay | Admitting: Orthopedic Surgery

## 2021-12-28 NOTE — Telephone Encounter (Signed)
Pt called and would like to know if it is time for her to do her cortisone shot in her R knee . She states she is just in a little pain not a lot ? ?[phone] ?

## 2021-12-29 ENCOUNTER — Telehealth: Payer: Self-pay | Admitting: Surgical

## 2021-12-29 NOTE — Telephone Encounter (Signed)
Message has been sent to [facility] Awaiting response. ?

## 2021-12-29 NOTE — Telephone Encounter (Signed)
Pt called requesting a call from [place] about her last message. Please call pt at [phone]. ?

## 2021-12-29 NOTE — Telephone Encounter (Signed)
Okay for this in first week of May

## 2021-12-30 NOTE — Telephone Encounter (Signed)
Contacted patient and she has scheduled her appointment for 02/04/2022 at 3:30. No further questions or concerns from patient.  ?

## 2021-12-30 NOTE — Telephone Encounter (Signed)
Contacted patient to get her scheduled for her injection in her right knee however states that she was told she would be able to have this done the first week in April. Please advise.  ?

## 2021-12-30 NOTE — Telephone Encounter (Signed)
Either I was bad at math when I talked to her last time or she heard me wrong. She had injection in 1/30 so 3 months after that is late April. Last week of April is the soonest I would consider injection

## 2022-01-03 ENCOUNTER — Other Ambulatory Visit: Payer: Self-pay | Admitting: Internal Medicine

## 2022-01-03 DIAGNOSIS — I10 Essential (primary) hypertension: Secondary | ICD-10-CM

## 2022-01-11 ENCOUNTER — Encounter (INDEPENDENT_AMBULATORY_CARE_PROVIDER_SITE_OTHER): Payer: Self-pay | Admitting: Gastroenterology

## 2022-01-11 ENCOUNTER — Ambulatory Visit (INDEPENDENT_AMBULATORY_CARE_PROVIDER_SITE_OTHER): Payer: Medicare Other | Admitting: Gastroenterology

## 2022-01-24 ENCOUNTER — Encounter: Payer: Self-pay | Admitting: Internal Medicine

## 2022-01-24 ENCOUNTER — Ambulatory Visit (INDEPENDENT_AMBULATORY_CARE_PROVIDER_SITE_OTHER): Payer: Medicare Other | Admitting: Internal Medicine

## 2022-01-24 DIAGNOSIS — J0110 Acute frontal sinusitis, unspecified: Secondary | ICD-10-CM

## 2022-01-24 MED ORDER — AZITHROMYCIN 250 MG PO TABS: ORAL_TABLET | ORAL | 0 refills | Status: AC

## 2022-01-24 NOTE — Progress Notes (Signed)
?  ? ?Virtual Visit via Telephone Note  ? ?This visit type was conducted due to national recommendations for restrictions regarding the COVID-19 Pandemic (e.g. social distancing) in an effort to limit this patient's exposure and mitigate transmission in our community.  Due to her co-morbid illnesses, this patient is at least at moderate risk for complications without adequate follow up.  This format is felt to be most appropriate for this patient at this time.  The patient did not have access to video technology/had technical difficulties with video requiring transitioning to audio format only (telephone).  All issues noted in this document were discussed and addressed.  No physical exam could be performed with this format. ? ?Evaluation Performed:  Follow-up visit ? ?Date:  01/24/2022  ? ?ID:  Jaclyn Brooks, DOB 05/23/1961, MRN [id number] ? ?Patient Location: Home ?Provider Location: Office/Clinic ? ?Participants: Patient ?Location of Patient: Home ?Location of Provider: Telehealth ?Consent was obtain for visit to be over via telehealth. ?I verified that I am speaking with the correct person using two identifiers. ? ?PCP:  Lindell Spar, MD  ? ?Chief Complaint: Cough and nasal congestion ? ?History of Present Illness:   ? ?Jaclyn Brooks is a 61 y.o. female who has a televisit for complaint of cough and nasal congestion for last 1 week.  She also complains of sinus pressure related headache and postnasal drip, but denies any fever, chills, dyspnea or wheezing currently.  She has tried using nasal saline spray with minimal relief. ? ?The patient does not have symptoms concerning for COVID-19 infection (fever, chills, cough, or new shortness of breath).  ? ?Past Medical, Surgical, Social History, Allergies, and Medications have been Reviewed. ? ?Past Medical History:  ?Diagnosis Date  ? Allergy   ? Asthma   ? Phreesia 11/25/2020  ? Chronic kidney disease   ? Phreesia 11/25/2020  ? Depression   ? Gout   ?  Hyperlipidemia   ? Hypertension   ? 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 ?Past Surgical History:  ?Procedure Laterality Date  ? ABDOMINAL HYSTERECTOMY    ? CHOLECYSTECTOMY N/A 01/28/2015  ? Procedure: LAPAROSCOPIC CHOLECYSTECTOMY;  Surgeon: Aviva Signs Md, MD;  Location: [facility];  Service: General;  Laterality: N/A;  ? excision of bone spurs Bilateral   ? Big toes  ? renal aneurysm Right   ? right coil-and only has function of left kidney  ? X-STOP IMPLANTATION    ?  ? ?Current Meds  ?Medication Sig  ? acetaminophen-codeine (TYLENOL #3) 300-30 MG tablet Take 1 tablet by mouth at bedtime as needed for severe pain.  ? albuterol (VENTOLIN HFA) 108 (90 Base) MCG/ACT inhaler Inhale 1-2 puffs into the lungs every 6 (six) hours as needed for wheezing or shortness of breath.  ? allopurinol (ZYLOPRIM) 100 MG tablet Take 2 tablets (200 mg total) by mouth daily.  ? atenolol (TENORMIN) 50 MG tablet Take 1 tablet (50 mg total) by mouth daily.  ? budesonide-formoterol (SYMBICORT) 80-4.5 MCG/ACT inhaler Inhale 2 puffs into the lungs 2 (two) times daily.  ? CARTIA XT 240 MG 24 hr capsule Take 1 capsule by mouth once daily  ? clopidogrel (PLAVIX) 75 MG tablet Take 1 tablet by mouth in the evening  ? guaiFENesin-dextromethorphan (ROBITUSSIN DM) 100-10 MG/5ML syrup Take 5 mLs by mouth every 4 (four) hours as needed for cough.  ? hydrochlorothiazide (HYDRODIURIL) 25 MG tablet Take 1 tablet (25 mg total) by mouth daily.  ? hydrocortisone (ANUSOL-HC) 2.5 % rectal  cream Place 1 application rectally 2 (two) times daily.  ? methocarbamol (ROBAXIN) 500 MG tablet Take 1 tablet (500 mg total) by mouth 2 (two) times daily.  ? ondansetron (ZOFRAN) 4 MG tablet Take 1 tablet (4 mg total) by mouth every 8 (eight) hours as needed for nausea or vomiting.  ? pantoprazole (PROTONIX) 40 MG tablet Take 1 tablet (40 mg total) by mouth daily.  ? sodium chloride (OCEAN) 0.65 % SOLN nasal spray Place 1 spray into both nostrils as needed for congestion.  ?   ? ?Allergies:   Bee venom, Nsaids, and Shellfish allergy  ? ?ROS:   ?Please see the history of present illness.    ? ?All other systems reviewed and are negative. ? ? ?Labs/Other Tests and Data Reviewed:   ? ?Recent Labs: ?03/04/2021: ALT 30; BUN 32; Creat 1.34; Potassium CANCELED; Sodium 137  ? ?Recent Lipid Panel ?Lab Results  ?Component Value Date/Time  ? CHOL 157 06/19/2020 08:50 AM  ? TRIG 287 (H) 06/19/2020 08:50 AM  ? HDL 24 (L) 06/19/2020 08:50 AM  ? CHOLHDL 6.5 (H) 06/19/2020 08:50 AM  ? LDLCALC 92 06/19/2020 08:50 AM  ? ? ?Wt Readings from Last 3 Encounters:  ?11/30/21 169 lb 3.2 oz (76.7 kg)  ?08/27/21 172 lb 1.9 oz (78.1 kg)  ?06/24/21 171 lb (77.6 kg)  ?  ? ?ASSESSMENT & PLAN:   ? ?Acute sinusitis ?Started empiric azithromycin ?Continue nasal saline spray ?Advised to use vaporizer/humidifier ?Mucinex or Robitussin as needed for cough ? ?Time:   ?Today, I have spent 9 minutes reviewing the chart, including problem list, medications, and with the patient with telehealth technology discussing the above problems. ? ? ?Medication Adjustments/Labs and Tests Ordered: ?Current medicines are reviewed at length with the patient today.  Concerns regarding medicines are outlined above.  ? ?Tests Ordered: ?No orders of the defined types were placed in this encounter. ? ? ?Medication Changes: ?No orders of the defined types were placed in this encounter. ? ? ? ?Note: This dictation was prepared with Dragon dictation along with smaller phrase technology. Similar sounding words can be transcribed inadequately or may not be corrected upon review. Any transcriptional errors that result from this process are unintentional.  ?  ? ? ?Disposition:  Follow up  ?Signed, ?Lindell Spar, MD  ?01/24/2022 4:53 PM    ? ?[facility] ?[facility] ?

## 2022-01-31 ENCOUNTER — Ambulatory Visit (INDEPENDENT_AMBULATORY_CARE_PROVIDER_SITE_OTHER): Payer: Medicare Other | Admitting: Gastroenterology

## 2022-02-04 ENCOUNTER — Ambulatory Visit (INDEPENDENT_AMBULATORY_CARE_PROVIDER_SITE_OTHER): Payer: Medicare Other | Admitting: Surgical

## 2022-02-04 DIAGNOSIS — M1711 Unilateral primary osteoarthritis, right knee: Secondary | ICD-10-CM | POA: Diagnosis not present

## 2022-02-06 ENCOUNTER — Encounter: Payer: Self-pay | Admitting: Surgical

## 2022-02-06 MED ORDER — METHYLPREDNISOLONE ACETATE 40 MG/ML IJ SUSP
40.0000 mg | INTRAMUSCULAR | Status: AC | PRN
  Administered 2022-02-04: 40 mg via INTRA_ARTICULAR

## 2022-02-06 MED ORDER — BUPIVACAINE HCL 0.25 % IJ SOLN
4.0000 mL | INTRAMUSCULAR | Status: AC | PRN
  Administered 2022-02-04: 4 mL via INTRA_ARTICULAR

## 2022-02-06 MED ORDER — LIDOCAINE HCL 1 % IJ SOLN
5.0000 mL | INTRAMUSCULAR | Status: AC | PRN
  Administered 2022-02-04: 5 mL

## 2022-02-06 NOTE — Progress Notes (Signed)
? ?  Procedure Note ? ?Patient: Jaclyn Brooks             ?Date of Birth: 02-07-1961           ?MRN: [id number]             ?Visit Date: 02/04/2022 ? ?Procedures: ?Visit Diagnoses:  ?1. Arthritis of right knee   ? ? ?Large Joint Inj: R knee on 02/04/2022 12:09 PM ?Indications: diagnostic evaluation, joint swelling and pain ?Details: 18 G 1.5 in needle, superolateral approach ? ?Arthrogram: No ? ?Medications: 5 mL lidocaine 1 %; 40 mg methylPREDNISolone acetate 40 MG/ML; 4 mL bupivacaine 0.25 % ?Aspirate: 20 mL ?Outcome: tolerated well, no immediate complications ? ?Patient doing well overall since last injection.  Dr. Posey Pronto, her PCP, has increased her dose of allopurinol in order to avoid more acute gout flares that she has been experiencing.  She is tolerating the increased dose of allopurinol without difficulty according to her today. ?Procedure, treatment alternatives, risks and benefits explained, specific risks discussed. Consent was given by the patient. Immediately prior to procedure a time out was called to verify the correct patient, procedure, equipment, support staff and site/side marked as required. Patient was prepped and draped in the usual sterile fashion.  ? ? ? ? ? ?

## 2022-02-08 DIAGNOSIS — N1832 Chronic kidney disease, stage 3b: Secondary | ICD-10-CM | POA: Diagnosis not present

## 2022-02-08 DIAGNOSIS — I722 Aneurysm of renal artery: Secondary | ICD-10-CM | POA: Diagnosis not present

## 2022-02-08 DIAGNOSIS — I129 Hypertensive chronic kidney disease with stage 1 through stage 4 chronic kidney disease, or unspecified chronic kidney disease: Secondary | ICD-10-CM | POA: Diagnosis not present

## 2022-02-08 DIAGNOSIS — N2581 Secondary hyperparathyroidism of renal origin: Secondary | ICD-10-CM | POA: Diagnosis not present

## 2022-02-08 DIAGNOSIS — Z905 Acquired absence of kidney: Secondary | ICD-10-CM | POA: Diagnosis not present

## 2022-03-09 ENCOUNTER — Ambulatory Visit (INDEPENDENT_AMBULATORY_CARE_PROVIDER_SITE_OTHER): Payer: Medicare Other | Admitting: Internal Medicine

## 2022-03-09 ENCOUNTER — Encounter: Payer: Self-pay | Admitting: Internal Medicine

## 2022-03-09 DIAGNOSIS — M1711 Unilateral primary osteoarthritis, right knee: Secondary | ICD-10-CM | POA: Insufficient documentation

## 2022-03-09 DIAGNOSIS — K219 Gastro-esophageal reflux disease without esophagitis: Secondary | ICD-10-CM

## 2022-03-09 DIAGNOSIS — R110 Nausea: Secondary | ICD-10-CM | POA: Diagnosis not present

## 2022-03-09 MED ORDER — HYDROCODONE-ACETAMINOPHEN 5-325 MG PO TABS: 1.0000 | ORAL_TABLET | Freq: Four times a day (QID) | ORAL | 0 refills | Status: DC | PRN

## 2022-03-09 MED ORDER — ONDANSETRON HCL 4 MG PO TABS: 4.0000 mg | ORAL_TABLET | Freq: Three times a day (TID) | ORAL | 0 refills | Status: DC | PRN

## 2022-03-09 MED ORDER — PANTOPRAZOLE SODIUM 40 MG PO TBEC: 40.0000 mg | DELAYED_RELEASE_TABLET | Freq: Every day | ORAL | 2 refills | Status: DC

## 2022-03-09 NOTE — Assessment & Plan Note (Signed)
Followed by orthopedic surgery Has had steroid injection Unable to take oral NSAIDs Norco as needed for severe pain for now Tylenol as needed for mild to moderate pain

## 2022-03-09 NOTE — Assessment & Plan Note (Signed)
Zofran as needed for nausea

## 2022-03-09 NOTE — Patient Instructions (Signed)
Please take Tylenol for mild to moderate knee pain.  Please take Norco as needed for severe pain.  Please continue taking pantoprazole for acid reflux. Please take Zofran as needed for nausea.

## 2022-03-09 NOTE — Assessment & Plan Note (Signed)
Refilled pantoprazole Her nausea and epigastric pain likely due to GERD/gastritis

## 2022-03-09 NOTE — Progress Notes (Signed)
Virtual Visit via Telephone Note   This visit type was conducted due to national recommendations for restrictions regarding the COVID-19 Pandemic (e.g. social distancing) in an effort to limit this patient's exposure and mitigate transmission in our community.  Due to her co-morbid illnesses, this patient is at least at moderate risk for complications without adequate follow up.  This format is felt to be most appropriate for this patient at this time.  The patient did not have access to video technology/had technical difficulties with video requiring transitioning to audio format only (telephone).  All issues noted in this document were discussed and addressed.  No physical exam could be performed with this format.  Evaluation Performed:  Follow-up visit  Date:  03/09/2022   ID:  IVALEE Brooks, DOB 1961-01-25, MRN [id number]  Patient Location: Home Provider Location: Office/Clinic  Participants: Patient Location of Patient: Home Location of Provider: Telehealth Consent was obtain for visit to be over via telehealth. I verified that I am speaking with the correct person using two identifiers.  PCP:  Lindell Spar, MD   Chief Complaint: Right knee pain and nausea  History of Present Illness:    Jaclyn Brooks is a 61 y.o. female who has a televisit for complaint of chronic right knee pain, which is worse recently.  She has had orthopedic surgery evaluation for right knee pain and was given steroid injection, which helped for short-term.  She denies any recent injury or fall.  She has to use walker due to severe right knee pain.  Due to her history of CKD, she is not able to take oral NSAIDs currently.  She also complains of nausea and mild epigastric pain.  Of note, she did not continue taking pantoprazole for GERD.  The patient does not have symptoms concerning for COVID-19 infection (fever, chills, cough, or new shortness of breath).   Past Medical, Surgical, Social History,  Allergies, and Medications have been Reviewed.  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   allopurinol (ZYLOPRIM) 100 MG tablet Take 2 tablets (200 mg total) by mouth daily.   atenolol (TENORMIN) 50 MG tablet Take 1 tablet (50 mg total) by mouth daily.   budesonide-formoterol (SYMBICORT) 80-4.5 MCG/ACT inhaler Inhale 2 puffs into the lungs 2 (two) times daily.   CARTIA XT 240 MG 24 hr capsule Take 1 capsule by mouth once daily   clopidogrel (PLAVIX) 75 MG tablet Take 1 tablet by mouth in the evening   guaiFENesin-dextromethorphan (ROBITUSSIN DM) 100-10 MG/5ML syrup Take 5 mLs by mouth every 4 (four) hours as  needed for cough.   hydrochlorothiazide (HYDRODIURIL) 25 MG tablet Take 1 tablet (25 mg total) by mouth daily.   HYDROcodone-acetaminophen (NORCO/VICODIN) 5-325 MG tablet Take 1 tablet by mouth every 6 (six) hours as needed for moderate pain.   hydrocortisone (ANUSOL-HC) 2.5 % rectal cream Place 1 application rectally 2 (two) times daily.   methocarbamol (ROBAXIN) 500 MG tablet Take 1 tablet (500 mg total) by mouth 2 (two) times daily.   sodium chloride (OCEAN) 0.65 % SOLN nasal spray Place 1 spray into both nostrils as needed for  congestion.   [DISCONTINUED] acetaminophen-codeine (TYLENOL #3) 300-30 MG tablet Take 1 tablet by mouth at bedtime as needed for severe pain.   [DISCONTINUED] ondansetron (ZOFRAN) 4 MG tablet Take 1 tablet (4 mg total) by mouth every 8 (eight) hours as needed for nausea or vomiting.   [DISCONTINUED] pantoprazole (PROTONIX) 40 MG tablet Take 1 tablet (40 mg total) by mouth daily.     Allergies:   Bee venom, Nsaids, and Shellfish allergy   ROS:   Please see the history of present illness.     All other systems reviewed and are negative.   Labs/Other Tests and Data Reviewed:    Recent Labs: No results found for requested labs within last 8760 hours.   Recent Lipid Panel Lab Results  Component Value Date/Time   CHOL 157 06/19/2020 08:50 AM   TRIG 287 (H) 06/19/2020 08:50 AM   HDL 24 (L) 06/19/2020 08:50 AM   CHOLHDL 6.5 (H) 06/19/2020 08:50 AM   LDLCALC 92 06/19/2020 08:50 AM    Wt Readings from Last 3 Encounters:  11/30/21 169 lb 3.2 oz (76.7 kg)  08/27/21 172 lb 1.9 oz (78.1 kg)  06/24/21 171 lb (77.6 kg)     ASSESSMENT & PLAN:    Primary osteoarthritis of right knee Followed by orthopedic surgery Has had steroid injection Unable to take oral NSAIDs Norco as needed for severe pain for now Tylenol as needed for mild to moderate pain  GERD (gastroesophageal reflux disease) Refilled pantoprazole Her nausea and epigastric pain likely due to GERD/gastritis  Nausea Zofran as needed for nausea   Time:   Today, I have spent 12 minutes reviewing the chart, including problem list, medications, and with the patient with telehealth technology discussing the above problems.   Medication Adjustments/Labs and Tests Ordered: Current medicines are reviewed at length with the patient today.  Concerns regarding medicines are outlined above.   Tests Ordered: No orders of the defined types were placed in this encounter.   Medication Changes: Meds ordered this encounter   Medications   ondansetron (ZOFRAN) 4 MG tablet    Sig: Take 1 tablet (4 mg total) by mouth every 8 (eight) hours as needed for nausea or vomiting.    Dispense:  20 tablet    Refill:  0   pantoprazole (PROTONIX) 40 MG tablet    Sig: Take 1 tablet (40 mg total) by mouth daily.    Dispense:  30 tablet    Refill:  2   HYDROcodone-acetaminophen (NORCO/VICODIN) 5-325 MG tablet    Sig: Take 1 tablet by mouth every 6 (six) hours as needed for moderate pain.    Dispense:  20 tablet    Refill:  0     Note: This dictation was prepared with Dragon dictation along with smaller phrase technology. Similar sounding words can be transcribed inadequately or may not be corrected upon review. Any transcriptional errors that result from this process are unintentional.  Disposition:  Follow up  Signed, Lindell Spar, MD  03/09/2022 12:10 PM     [facility]

## 2022-03-10 ENCOUNTER — Telehealth: Payer: Self-pay

## 2022-03-10 ENCOUNTER — Encounter: Payer: Self-pay | Admitting: *Deleted

## 2022-03-10 NOTE — Telephone Encounter (Signed)
Patient called need work note that was out of work for Wednesday and Thursday. She had and appt with Dr patel. Will return to work Friday. Due to knee pain.  Contact patient when ready to pick up.

## 2022-03-10 NOTE — Telephone Encounter (Signed)
Letter has been printed and given to front staff for pt to pick up can you please call and let her know

## 2022-03-10 NOTE — Telephone Encounter (Signed)
Patient will come by to pick up letter.

## 2022-03-11 ENCOUNTER — Telehealth: Payer: Self-pay

## 2022-03-11 ENCOUNTER — Encounter: Payer: Self-pay | Admitting: Surgical

## 2022-03-11 ENCOUNTER — Ambulatory Visit (INDEPENDENT_AMBULATORY_CARE_PROVIDER_SITE_OTHER): Payer: Medicare Other | Admitting: Surgical

## 2022-03-11 DIAGNOSIS — M25461 Effusion, right knee: Secondary | ICD-10-CM

## 2022-03-11 MED ORDER — BUPIVACAINE HCL 0.25 % IJ SOLN
4.0000 mL | INTRAMUSCULAR | Status: AC | PRN
  Administered 2022-03-11: 4 mL via INTRA_ARTICULAR

## 2022-03-11 MED ORDER — LIDOCAINE HCL 1 % IJ SOLN
5.0000 mL | INTRAMUSCULAR | Status: AC | PRN
  Administered 2022-03-11: 5 mL

## 2022-03-11 NOTE — Telephone Encounter (Signed)
Could we get Pre approval fr R knee gel injections

## 2022-03-11 NOTE — Progress Notes (Signed)
Office Visit Note   Patient: Jaclyn Brooks           Date of Birth: 06/05/1961           MRN: [id number] Visit Date: 03/11/2022 Requested by: Lindell Spar, MD [address] [place],  [place] [address] PCP: Lindell Spar, MD  Subjective: Chief Complaint  Patient presents with   Right Knee - Pain    HPI: Jaclyn Brooks is a 61 y.o. female who presents to the office complaining of right knee pain.  Patient returns for reevaluation of right knee pain that has flared up since last Sunday after she worked three 12-hour shifts 3 days in a row.  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  She uses a knee sleeve at work.  She notes she noticed swelling and pain after her third shift.  No mechanical symptoms or instability.  She does have warmth in the joint.  No fevers or chills but she does describe some nausea that she has been dealing with as recently as yesterday.  No nausea today.  She is eating and drinking normally.  She has history of gout and right knee arthritis.              ROS: All systems reviewed are negative as they relate to the chief complaint within the history of present illness.  Patient denies fevers or chills.  Assessment & Plan: Visit Diagnoses:  1. Effusion, right knee     Plan: Patient is a 61 year old female who returns for reevaluation of right knee pain.  Has history of right knee osteoarthritis as well as recurrent gout flares that seem to primarily affect the right knee.  Has had increasing pain since 3 long shifts at her new job which is very active.  She would like to have aspiration and injection today.  Last cortisone injection was about 5 weeks ago so plan for Toradol injection today.  Aspirated of about 15 cc of nonpurulent appearing sanguinous fluid but there was some biologic debris noted in the aspiration fluid so plan to send off for synovial fluid analysis as well  as anaerobic/aerobic cultures/Gram stain.  Toradol administered and patient tolerated procedure well.  Hinged knee brace provided.  Preapproved her for right knee gel injection and follow-up in about 4 weeks for gel injection.  Call her with aspiration results if there are any concerns.  Did discuss with her today that her current new job involving a lot of of walking and stairs may not be the best occupation for her long-term given her moderate knee arthritis.  She also may need to consider knee replacement in the future since these injections are only lasting 1 to 2 months but this is really up to her and based on her symptoms.  This patient is diagnosed with osteoarthritis of the knee(s).    Radiographs show evidence of joint space narrowing, osteophytes, subchondral sclerosis and/or subchondral cysts.  This patient has knee pain which interferes with functional and activities of daily living.    This patient has experienced inadequate response, adverse effects and/or intolerance with conservative treatments such as acetaminophen, NSAIDS, topical creams, physical therapy or regular exercise, knee bracing and/or weight loss.   This patient has experienced inadequate response or has a contraindication to intra articular steroid injections for at least 3 months.   This patient is not scheduled  to have a total knee replacement within 6 months of starting treatment with viscosupplementation.   Follow-Up Instructions: No follow-ups on file.   Orders:  Orders Placed This Encounter  Procedures   Anaerobic and Aerobic Culture   Synovial Fluid Analysis, Complete   No orders of the defined types were placed in this encounter.     Procedures: Large Joint Inj: R knee on 03/11/2022 11:41 AM Indications: diagnostic evaluation, joint swelling and pain Details: 18 G 1.5 in needle, superolateral approach  Arthrogram: No  Medications: 5 mL lidocaine 1 %; 4 mL bupivacaine 0.25 % (Bupivacaine mixed  with 1 cc Toradol) Aspirate: 15 mL bloody Outcome: tolerated well, no immediate complications Procedure, treatment alternatives, risks and benefits explained, specific risks discussed. Consent was given by the patient. Immediately prior to procedure a time out was called to verify the correct patient, procedure, equipment, support staff and site/side marked as required. Patient was prepped and draped in the usual sterile fashion.      Clinical Data: No additional findings.  Objective: Vital Signs: There were no vitals taken for this visit.  Physical Exam:  Constitutional: Patient appears well-developed HEENT:  Head: Normocephalic Eyes:EOM are normal Neck: Normal range of motion Cardiovascular: Normal rate Pulmonary/chest: Effort normal Neurologic: Patient is alert Skin: Skin is warm Psychiatric: Patient has normal mood and affect  Ortho Exam: Ortho exam demonstrates right knee with positive effusion.  Increased warmth over the right knee compared to left.  She has tenderness over the lateral joint line moderately with mild tenderness over the medial joint line.  No calf tenderness.  Negative Homans' sign.  Patient is able to perform straight leg raise with reproduction of pain but good quad strength.  No pain with hip range of motion.  No cellulitis or skin changes noted.  5 degrees extension to 100 degrees of knee flexion.  Specialty Comments:  No specialty comments available.  Imaging: No results found.   PMFS History: Patient Active Problem List   Diagnosis Date Noted   Primary osteoarthritis of right knee 03/09/2022   Refused influenza vaccine 12/02/2021   Encounter for general adult medical examination with abnormal findings 11/30/2021   Epidermal cyst 11/30/2021   Acute sinusitis 09/21/2021   Allergic rhinitis 08/29/2021   Nausea 08/27/2021   Hemorrhoids 07/07/2021   Gout 03/25/2021   Diabetes mellitus without complication ([facility]) 24/40/1027   Primary  osteoarthritis of right foot 05/12/2020   GERD (gastroesophageal reflux disease) 05/31/2019   Solitary kidney, acquired 05/10/2019   Carotid arterial disease ([facility]) 12/11/2018   COPD, 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   CKD (chronic kidney disease) stage 3, GFR 30-59 ml/min ([facility]) 10/01/2018   Aortic valve sclerosis 10/01/2018   Hypertension 09/11/2018   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[facility])     Family History  Problem Relation Age of Onset   Hypertension Mother    Heart disease Mother        Cardiac Arrest    Hypertension Father    Cancer Father    Hypertension Sister    Cancer Brother        Bladder Cancer    Hypertension Brother    Hypertension Sister     Past Surgical History:  Procedure Laterality Date   ABDOMINAL  HYSTERECTOMY     CHOLECYSTECTOMY N/A 01/28/2015   Procedure: LAPAROSCOPIC CHOLECYSTECTOMY;  Surgeon: Aviva Signs Md, MD;  Location: [facility];  Service: General;  Laterality: N/A;   excision of bone spurs Bilateral    Big toes   renal aneurysm Right    right coil-and only has function of left kidney   X-STOP IMPLANTATION     Social History   Occupational History   Occupation: Retired  Tobacco Use   Smoking status: Former    Packs/day: 0.25    Years: 20.00    Pack years: 5.00    Types: Cigarettes    Quit date: 2019    Years since quitting: 4.4   Smokeless tobacco: Never  Vaping Use   Vaping Use: Never used  Substance and Sexual Activity   Alcohol use: Not Currently    Comment: occasionally   Drug use: No   Sexual activity: Yes    Birth control/protection: Surgical

## 2022-03-14 ENCOUNTER — Ambulatory Visit (INDEPENDENT_AMBULATORY_CARE_PROVIDER_SITE_OTHER): Payer: Medicare Other | Admitting: Gastroenterology

## 2022-03-14 ENCOUNTER — Encounter (INDEPENDENT_AMBULATORY_CARE_PROVIDER_SITE_OTHER): Payer: Self-pay | Admitting: Gastroenterology

## 2022-03-15 NOTE — Telephone Encounter (Signed)
Noted  

## 2022-03-17 LAB — ANAEROBIC AND AEROBIC CULTURE

## 2022-03-17 LAB — SYNOVIAL FLUID ANALYSIS, COMPLETE
Basophils, %: 0 %
Eosinophils-Synovial: 0 % (ref 0–2)
Lymphocytes-Synovial Fld: 15 % (ref 0–74)
Monocyte/Macrophage: 11 % (ref 0–69)
Neutrophil, Synovial: 74 % — ABNORMAL HIGH (ref 0–24)
Synoviocytes, %: 0 % (ref 0–15)
WBC, Synovial: 522 cells/uL — ABNORMAL HIGH (ref <150)

## 2022-03-17 LAB — EXTRA LAV TOP TUBE

## 2022-03-21 ENCOUNTER — Other Ambulatory Visit: Payer: Self-pay | Admitting: Internal Medicine

## 2022-03-21 DIAGNOSIS — I10 Essential (primary) hypertension: Secondary | ICD-10-CM

## 2022-04-13 ENCOUNTER — Ambulatory Visit (INDEPENDENT_AMBULATORY_CARE_PROVIDER_SITE_OTHER): Payer: Medicare Other | Admitting: Internal Medicine

## 2022-04-13 ENCOUNTER — Encounter: Payer: Self-pay | Admitting: Internal Medicine

## 2022-04-13 VITALS — BP 138/88 | HR 51 | Resp 18 | Ht 66.0 in | Wt 165.6 lb

## 2022-04-13 DIAGNOSIS — L259 Unspecified contact dermatitis, unspecified cause: Secondary | ICD-10-CM

## 2022-04-13 DIAGNOSIS — Z1211 Encounter for screening for malignant neoplasm of colon: Secondary | ICD-10-CM

## 2022-04-13 DIAGNOSIS — M545 Low back pain, unspecified: Secondary | ICD-10-CM | POA: Diagnosis not present

## 2022-04-13 DIAGNOSIS — G8929 Other chronic pain: Secondary | ICD-10-CM

## 2022-04-13 MED ORDER — TRIAMCINOLONE ACETONIDE 0.1 % EX CREA: 1.0000 | TOPICAL_CREAM | Freq: Two times a day (BID) | CUTANEOUS | 0 refills | Status: DC

## 2022-04-13 MED ORDER — METHOCARBAMOL 500 MG PO TABS: 500.0000 mg | ORAL_TABLET | Freq: Two times a day (BID) | ORAL | 1 refills | Status: DC

## 2022-04-13 NOTE — Assessment & Plan Note (Signed)
Likely related to recent heavy lifting, advised to avoid heavy lifting -work note provided Tylenol as needed for mild-to-moderate pain Robaxin as needed for muscle spasms Simple back exercises material provided

## 2022-04-13 NOTE — Assessment & Plan Note (Signed)
Unclear etiology, although does not look like shingles Kenalog cream as needed for itching/irritation Keep area clean and dry

## 2022-04-13 NOTE — Patient Instructions (Signed)
Please take Robaxin as needed for muscle spasms.  Please take Tylenol as needed for back pain.  Avoid heavy lifting and frequent bending.  Please apply Kenalog cream to rash area for itching.

## 2022-04-13 NOTE — Progress Notes (Signed)
Acute Office Visit  Subjective:    Patient ID: Jaclyn Brooks, female    DOB: 10/05/1961, 61 y.o.   MRN: [id number]  Chief Complaint  Patient presents with   Back Pain    Pt having back spasms around to abdomen started 04-06-22 also has a rash on right leg started 04-08-22    HPI Patient is in today for complaint of left-sided lower back pain, which radiates towards left flank area.  Pain is constant, worse with movement, spasm-like and gets better with rest. 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  Denies any recent injury or fall.  She denies any numbness or tingling of the feet.  She also reports right-sided neck pain in the trapezius area, which is worse with movement.  She also reports noticing vesicular rash over her right thigh area, which is chronic and has been self resolving at times.  Denies any insect bite.  Denies any pain or burning over the rash area.  She does have mild itching at times.  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[facility])     Past Surgical History:  Procedure Laterality Date   ABDOMINAL HYSTERECTOMY     CHOLECYSTECTOMY N/A 01/28/2015   Procedure: LAPAROSCOPIC CHOLECYSTECTOMY;  Surgeon: Aviva Signs Md, MD;  Location: [facility];  Service: General;  Laterality: N/A;   excision of bone spurs Bilateral    Big toes   renal aneurysm Right    right coil-and only has function of left kidney   X-STOP IMPLANTATION      Family History  Problem Relation Age of Onset   Hypertension Mother    Heart disease Mother        Cardiac Arrest    Hypertension Father    Cancer Father    Hypertension Sister    Cancer Brother        Bladder Cancer    Hypertension Brother    Hypertension Sister     Social History   Socioeconomic History   Marital status:  Unknown    Spouse name: Not on file   Number of children: 1   Years of education: 12   Highest education level: Some college, no degree  Occupational History   Occupation: Retired  Tobacco Use   Smoking status: Former    Packs/day: 0.25    Years: 20.00    Total pack years: 5.00    Types: Cigarettes    Quit date: 2019    Years since quitting: 4.5   Smokeless tobacco: Never  Vaping Use   Vaping Use: Never used  Substance and Sexual Activity   Alcohol use: Not Currently    Comment: occasionally   Drug use: No   Sexual activity: Yes    Birth control/protection: Surgical  Other Topics Concern   Not on file  Social History Narrative   Not on file   Social Determinants of Health   Financial Resource Strain: Low Risk  (10/19/2021)   Overall Financial Resource Strain (CARDIA)    Difficulty of Paying Living Expenses: Not hard at all  Food Insecurity: No Food Insecurity (10/19/2021)   Hunger Vital Sign    Worried About Running Out of Food in the Last  Year: Never true    [place] in the Last Year: Never true  Transportation Needs: No Transportation Needs (10/19/2021)   PRAPARE - Hydrologist (Medical): No    Lack of Transportation (Non-Medical): No  Physical Activity: Sufficiently Active (10/19/2021)   Exercise Vital Sign    Days of Exercise per Week: 5 days    Minutes of Exercise per Session: 30 min  Stress: No Stress Concern Present (10/19/2021)   [place]    Feeling of Stress : Not at all  Social Connections: Moderately Isolated (10/19/2021)   Social Connection and Isolation Panel [NHANES]    Frequency of Communication with Friends and Family: More than three times a week    Frequency of Social Gatherings with Friends and Family: More than three times a week    Attends Religious Services: More than 4 times per year    Active Member of [employer] or Organizations: No    Attends  Archivist Meetings: Never    Marital Status: Never married  Intimate Partner Violence: Not At Risk (10/19/2021)   Humiliation, Afraid, Rape, and Kick questionnaire    Fear of Current or Ex-Partner: No    Emotionally Abused: No    Physically Abused: No    Sexually Abused: No    Outpatient Medications Prior to Visit  Medication Sig Dispense Refill   albuterol (VENTOLIN HFA) 108 (90 Base) MCG/ACT inhaler Inhale 1-2 puffs into the lungs every 6 (six) hours as needed for wheezing or shortness of breath. 1 each 2   allopurinol (ZYLOPRIM) 100 MG tablet Take 2 tablets (200 mg total) by mouth daily. 60 tablet 5   atenolol (TENORMIN) 50 MG tablet Take 1 tablet (50 mg total) by mouth daily. 90 tablet 1   budesonide-formoterol (SYMBICORT) 80-4.5 MCG/ACT inhaler Inhale 2 puffs into the lungs 2 (two) times daily. 1 each 2   CARTIA XT 240 MG 24 hr capsule Take 1 capsule by mouth once daily 90 capsule 0   clopidogrel (PLAVIX) 75 MG tablet Take 1 tablet by mouth in the evening 90 tablet 0   guaiFENesin-dextromethorphan (ROBITUSSIN DM) 100-10 MG/5ML syrup Take 5 mLs by mouth every 4 (four) hours as needed for cough. 118 mL 0   hydrochlorothiazide (HYDRODIURIL) 25 MG tablet Take 1 tablet (25 mg total) by mouth daily. 90 tablet 0   HYDROcodone-acetaminophen (NORCO/VICODIN) 5-325 MG tablet Take 1 tablet by mouth every 6 (six) hours as needed for moderate pain. 20 tablet 0   hydrocortisone (ANUSOL-HC) 2.5 % rectal cream Place 1 application rectally 2 (two) times daily. 30 g 0   ondansetron (ZOFRAN) 4 MG tablet Take 1 tablet (4 mg total) by mouth every 8 (eight) hours as needed for nausea or vomiting. 20 tablet 0   pantoprazole (PROTONIX) 40 MG tablet Take 1 tablet (40 mg total) by mouth daily. 30 tablet 2   sodium chloride (OCEAN) 0.65 % SOLN nasal spray Place 1 spray into both nostrils as needed for congestion. 15 mL 0   methocarbamol (ROBAXIN) 500 MG tablet Take 1 tablet (500 mg total) by mouth 2  (two) times daily. 10 tablet 0   No facility-administered medications prior to visit.    Allergies  Allergen Reactions   Bee Venom Hives   Nsaids     Kidney disease    Shellfish Allergy Itching    Review of Systems  Constitutional:  Negative for chills and fever.  HENT:  Negative for congestion, sinus pressure and sinus pain.   Eyes:  Negative for pain and discharge.  Respiratory:  Negative for cough and shortness of breath.   Cardiovascular:  Negative for chest pain and palpitations.  Gastrointestinal:  Negative for abdominal pain, diarrhea and vomiting.  Endocrine: Negative for polydipsia and polyuria.  Genitourinary:  Negative for dysuria and hematuria.  Musculoskeletal:  Positive for arthralgias, back pain and neck pain. Negative for neck stiffness.  Skin:  Positive for rash.  Neurological:  Negative for dizziness and weakness.  Psychiatric/Behavioral:  Negative for agitation and behavioral problems.        Objective:    Physical Exam Vitals reviewed.  Constitutional:      General: She is not in acute distress.    Appearance: She is not diaphoretic.  HENT:     Head: Normocephalic and atraumatic.     Nose: Nose normal.     Mouth/Throat:     Mouth: Mucous membranes are moist.  Eyes:     General: No scleral icterus.    Extraocular Movements: Extraocular movements intact.  Cardiovascular:     Rate and Rhythm: Normal rate and regular rhythm.     Pulses: Normal pulses.     Heart sounds: Normal heart sounds. No murmur heard. Pulmonary:     Breath sounds: Normal breath sounds. No wheezing or rales.  Musculoskeletal:        General: Tenderness (Left lumbar paraspinal area) present.     Cervical back: Neck supple. No tenderness.     Right lower leg: No edema.     Left lower leg: No edema.  Skin:    General: Skin is warm.     Findings: Rash (Vesicular over posterior aspect of lower right thigh) present.  Neurological:     General: No focal deficit present.      Mental Status: She is alert and oriented to person, place, and time.     Sensory: No sensory deficit.     Motor: No weakness.  Psychiatric:        Mood and Affect: Mood normal.        Behavior: Behavior normal.     BP 138/88 (BP Location: Right Arm, Patient Position: Sitting, Cuff Size: Normal)   Pulse (!) 51   Resp 18   Ht '5\' 6"'$  (1.676 m)   Wt 165 lb 9.6 oz (75.1 kg)   SpO2 100%   BMI 26.73 kg/m  Wt Readings from Last 3 Encounters:  04/13/22 165 lb 9.6 oz (75.1 kg)  11/30/21 169 lb 3.2 oz (76.7 kg)  08/27/21 172 lb 1.9 oz (78.1 kg)        Assessment & Plan:   Problem List Items Addressed This Visit       Musculoskeletal and Integument   Contact dermatitis    Unclear etiology, although does not look like shingles Kenalog cream as needed for itching/irritation Keep area clean and dry      Relevant Medications   triamcinolone cream (KENALOG) 0.1 %     Other   Chronic left-sided low back pain without sciatica - Primary    Likely related to recent heavy lifting, advised to avoid heavy lifting -work note provided Tylenol as needed for mild-to-moderate pain Robaxin as needed for muscle spasms Simple back exercises material provided      Relevant Medications   methocarbamol (ROBAXIN) 500 MG tablet   Other Visit Diagnoses     Colon cancer screening  Relevant Orders   Ambulatory referral to Gastroenterology        Meds ordered this encounter  Medications   methocarbamol (ROBAXIN) 500 MG tablet    Sig: Take 1 tablet (500 mg total) by mouth 2 (two) times daily.    Dispense:  30 tablet    Refill:  1   triamcinolone cream (KENALOG) 0.1 %    Sig: Apply 1 Application topically 2 (two) times daily.    Dispense:  30 g    Refill:  0     Khriz Liddy Keith Rake, MD

## 2022-04-14 ENCOUNTER — Encounter (INDEPENDENT_AMBULATORY_CARE_PROVIDER_SITE_OTHER): Payer: Self-pay | Admitting: *Deleted

## 2022-04-20 ENCOUNTER — Telehealth: Payer: Self-pay

## 2022-04-20 NOTE — Telephone Encounter (Signed)
VOB has been submitted for Durolane, right knee. BV pending

## 2022-04-28 ENCOUNTER — Other Ambulatory Visit: Payer: Self-pay | Admitting: Nephrology

## 2022-04-28 ENCOUNTER — Ambulatory Visit (INDEPENDENT_AMBULATORY_CARE_PROVIDER_SITE_OTHER): Payer: Medicare Other | Admitting: Family Medicine

## 2022-04-28 ENCOUNTER — Encounter: Payer: Self-pay | Admitting: Family Medicine

## 2022-04-28 VITALS — BP 150/80 | HR 50 | Ht 66.0 in | Wt 164.1 lb

## 2022-04-28 DIAGNOSIS — Z636 Dependent relative needing care at home: Secondary | ICD-10-CM

## 2022-04-28 DIAGNOSIS — I10 Essential (primary) hypertension: Secondary | ICD-10-CM

## 2022-04-28 DIAGNOSIS — F411 Generalized anxiety disorder: Secondary | ICD-10-CM

## 2022-04-28 DIAGNOSIS — N1832 Chronic kidney disease, stage 3b: Secondary | ICD-10-CM

## 2022-04-28 DIAGNOSIS — I722 Aneurysm of renal artery: Secondary | ICD-10-CM

## 2022-04-28 MED ORDER — BUSPIRONE HCL 5 MG PO TABS: 5.0000 mg | ORAL_TABLET | Freq: Two times a day (BID) | ORAL | 0 refills | Status: DC

## 2022-04-28 NOTE — Progress Notes (Signed)
   LELANIA BIA     MRN: [id number]      DOB: 09-Feb-1961   HPI [name] is here c/o stress, anxiety , caregiver burnout taking care of her 63 y/o father, with no help from sibs Works a full time job also and is physically and mentally overwhelmed, requesting a few days off to  "get herself together" Sleep is poor as her dad is up and waling unsteadily around 3 am every morning, nad she states he is starting to have a bad attitude with her , the caregiver. Also c/o increased back spasms , she already has medication for this  ROS  See HPI  PE  BP (!) 150/80   Pulse (!) 50   Ht '5\' 6"'$  (1.676 m)   Wt 164 lb 1.3 oz (74.4 kg)   SpO2 99%   BMI 26.48 kg/m   Patient alert and oriented and in no cardiopulmonary distress.  HEENT: No facial asymmetry, EOMI,     Neck supple .  Chest: Clear to auscultation bilaterally.  CVS: S1, S2  , no S3.Regular rate.  Ext: No edema  Psych: Good eye contact, normal affect. Memory intact  anxious not  depressed appearing.  CNS: CN 2-12 intact, pno focal deficits noted.   Assessment & Plan  GAD (generalized anxiety disorder) Encourage therapy which she can get through her job.  Start twice daily buspar 5 mg  Work excuse to return 05/02/2022  Hypertension Elevated at visit, f/u with PCP , no med changes  Caregiver stress Cares for 1 y/o father and works full time, increaSINGLY [place], [place]. THERAPY RECOMMENDED AND SHE IS RECEPTIVE , STATES SHE CAN GET THIS ON HER JOB WILL START MEDICATION AND FEW DAYS WORK EXCUSE TO RETURN 05/02/2022

## 2022-04-28 NOTE — Assessment & Plan Note (Signed)
Encourage therapy which she can get through her job.  Start twice daily buspar 5 mg  Work excuse to return 05/02/2022

## 2022-04-28 NOTE — Assessment & Plan Note (Signed)
Elevated at visit, f/u with PCP , no med changes

## 2022-04-28 NOTE — Assessment & Plan Note (Signed)
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 THERAPY RECOMMENDED AND SHE IS RECEPTIVE , STATES SHE CAN GET THIS ON HER JOB WILL START MEDICATION AND FEW DAYS WORK EXCUSE TO RETURN 05/02/2022

## 2022-04-28 NOTE — Patient Instructions (Addendum)
F/U with Dr Posey Pronto as before, call if you need to be seen sooner  New for anxiety is buspar one twice daily, I also recommend therapy through your job  Work excuse from 7/20, return 05/02/2022  Blood pressure high today, please take meds as prescribed , some days off from work will also help    Use robaxin for muscle spasms  Thanks for choosing [facility], we consider it a privelige to serve you.

## 2022-04-29 ENCOUNTER — Telehealth: Payer: Self-pay

## 2022-04-29 DIAGNOSIS — M1711 Unilateral primary osteoarthritis, right knee: Secondary | ICD-10-CM

## 2022-04-29 NOTE — Telephone Encounter (Signed)
Talked with patient and advised her that she was approved for gel injection, but patient prefers to hold off at this time.  Stated that her right knee is going good.  Will CB to when ready to proceed.

## 2022-05-03 ENCOUNTER — Other Ambulatory Visit: Payer: Medicare Other

## 2022-05-09 ENCOUNTER — Encounter: Payer: Self-pay | Admitting: Internal Medicine

## 2022-05-09 ENCOUNTER — Ambulatory Visit (INDEPENDENT_AMBULATORY_CARE_PROVIDER_SITE_OTHER): Payer: Medicare Other | Admitting: Internal Medicine

## 2022-05-09 ENCOUNTER — Encounter: Payer: Self-pay | Admitting: *Deleted

## 2022-05-09 DIAGNOSIS — M109 Gout, unspecified: Secondary | ICD-10-CM

## 2022-05-09 MED ORDER — PREDNISONE 20 MG PO TABS: 40.0000 mg | ORAL_TABLET | Freq: Every day | ORAL | 0 refills | Status: DC

## 2022-05-09 NOTE — Progress Notes (Signed)
Virtual Visit via Telephone Note   This visit type was conducted due to national recommendations for restrictions regarding the COVID-19 Pandemic (e.g. social distancing) in an effort to limit this patient's exposure and mitigate transmission in our community.  Due to her co-morbid illnesses, this patient is at least at moderate risk for complications without adequate follow up.  This format is felt to be most appropriate for this patient at this time.  The patient did not have access to video technology/had technical difficulties with video requiring transitioning to audio format only (telephone).  All issues noted in this document were discussed and addressed.  No physical exam could be performed with this format.  Evaluation Performed:  Follow-up visit  Date:  05/09/2022   ID:  Jaclyn Brooks, DOB [birth year], MRN [id number]  Patient Location: Home Provider Location: Office/Clinic  Participants: Patient Location of Patient: Home Location of Provider: Telehealth Consent was obtain for visit to be over via telehealth. I verified that I am speaking with the correct person using two identifiers.  PCP:  Lindell Spar, MD   Chief Complaint: Right foot pain  History of Present Illness:    Jaclyn Brooks is a 61 y.o. female who has a televisit for c/o right foot pain and swelling for the last 5 days.  She has a history of gout and has been taking allopurinol for it.  She denies any recent alcohol intake or any change in diet recently.  Denies any recent injury or insect bite.  She has had difficulty wearing shoe due to severe pain.  The patient does not have symptoms concerning for COVID-19 infection (fever, chills, cough, or new shortness of breath).   Past Medical, Surgical, Social History, Allergies, and Medications have been Reviewed.  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   allopurinol (ZYLOPRIM) 100 MG tablet Take 2 tablets (200 mg total) by mouth daily.   atenolol (TENORMIN) 50 MG tablet Take 1 tablet (50 mg total) by mouth daily.   budesonide-formoterol (SYMBICORT) 80-4.5 MCG/ACT inhaler Inhale 2 puffs into the lungs 2 (two) times daily.   busPIRone (BUSPAR) 5 MG tablet Take 1 tablet (5 mg total) by mouth 2 (two) times daily.   CARTIA XT 240 MG 24 hr capsule Take 1 capsule by mouth once daily   clopidogrel (PLAVIX) 75 MG tablet Take 1 tablet by mouth in the evening   guaiFENesin-dextromethorphan (ROBITUSSIN DM) 100-10 MG/5ML syrup Take 5 mLs by mouth every 4 (four) hours as needed for cough.   hydrochlorothiazide (HYDRODIURIL) 25 MG tablet Take 1 tablet (  25 mg total) by mouth daily.   hydrocortisone (ANUSOL-HC) 2.5 % rectal cream Place 1 application rectally 2 (two) times daily.   methocarbamol (ROBAXIN) 500 MG tablet Take 1 tablet (500 mg total) by mouth 2 (two) times daily.   ondansetron (ZOFRAN) 4 MG tablet Take 1 tablet (4 mg total) by mouth every 8 (eight) hours as needed for nausea or vomiting.   pantoprazole (PROTONIX) 40 MG tablet Take 1 tablet (40 mg total) by mouth daily.   sodium chloride (OCEAN) 0.65 % SOLN nasal spray Place 1 spray into both nostrils as needed for congestion.   triamcinolone cream  (KENALOG) 0.1 % Apply 1 Application topically 2 (two) times daily.     Allergies:   Bee venom, Nsaids, and Shellfish allergy   ROS:   Please see the history of present illness.     All other systems reviewed and are negative.   Labs/Other Tests and Data Reviewed:    Recent Labs: No results found for requested labs within last 365 days.   Recent Lipid Panel Lab Results  Component Value Date/Time   CHOL 157 06/19/2020 08:50 AM   TRIG 287 (H) 06/19/2020 08:50 AM   HDL 24 (L) 06/19/2020 08:50 AM   CHOLHDL 6.5 (H) 06/19/2020 08:50 AM   LDLCALC 92 06/19/2020 08:50 AM    Wt Readings from Last 3 Encounters:  04/28/22 164 lb 1.3 oz (74.4 kg)  04/13/22 165 lb 9.6 oz (75.1 kg)  11/30/21 169 lb 3.2 oz (76.7 kg)     ASSESSMENT & PLAN:    Acute gout flareup Right foot swelling and pain likely due to gout Started prednisone 40 mg QD X 5 days Would avoid NSAIDs and colchicine due to CKD Continue allopurinol 100 mg daily  Time:   Today, I have spent 12 minutes reviewing the chart, including problem list, medications, and with the patient with telehealth technology discussing the above problems.   Medication Adjustments/Labs and Tests Ordered: Current medicines are reviewed at length with the patient today.  Concerns regarding medicines are outlined above.   Tests Ordered: No orders of the defined types were placed in this encounter.   Medication Changes: No orders of the defined types were placed in this encounter.    Note: This dictation was prepared with Dragon dictation along with smaller phrase technology. Similar sounding words can be transcribed inadequately or may not be corrected upon review. Any transcriptional errors that result from this process are unintentional.      Disposition:  Follow up  Signed, Lindell Spar, MD  05/09/2022 12:55 PM     [place]

## 2022-05-12 ENCOUNTER — Other Ambulatory Visit: Payer: Medicare Other

## 2022-05-19 ENCOUNTER — Emergency Department (HOSPITAL_COMMUNITY)
Admission: EM | Admit: 2022-05-19 | Discharge: 2022-05-20 | Disposition: A | Discharge status: Home / Self Care | Payer: Medicare Other | Attending: Emergency Medicine | Admitting: Emergency Medicine

## 2022-05-19 ENCOUNTER — Encounter (HOSPITAL_COMMUNITY): Payer: Self-pay | Admitting: Emergency Medicine

## 2022-05-19 ENCOUNTER — Other Ambulatory Visit: Payer: Self-pay

## 2022-05-19 DIAGNOSIS — L02414 Cutaneous abscess of left upper limb: Secondary | ICD-10-CM | POA: Diagnosis not present

## 2022-05-19 DIAGNOSIS — M25512 Pain in left shoulder: Secondary | ICD-10-CM | POA: Diagnosis present

## 2022-05-19 DIAGNOSIS — L0291 Cutaneous abscess, unspecified: Secondary | ICD-10-CM

## 2022-05-19 NOTE — ED Triage Notes (Signed)
Pt c/o abscess to the left shoulder.

## 2022-05-20 MED ORDER — DOXYCYCLINE HYCLATE 100 MG PO CAPS: 100.0000 mg | ORAL_CAPSULE | Freq: Two times a day (BID) | ORAL | 0 refills | Status: DC

## 2022-05-20 MED ORDER — OXYCODONE-ACETAMINOPHEN 5-325 MG PO TABS: 1.0000 | ORAL_TABLET | Freq: Four times a day (QID) | ORAL | 0 refills | Status: DC | PRN

## 2022-05-20 MED ORDER — LIDOCAINE HCL (PF) 1 % IJ SOLN
30.0000 mL | Freq: Once | INTRAMUSCULAR | Status: AC
  Administered 2022-05-20: 30 mL
  Filled 2022-05-20: qty 30

## 2022-05-20 NOTE — ED Provider Notes (Signed)
[facility] EMERGENCY DEPARTMENT Provider Note   CSN: [id number] Arrival date & time: 05/19/22  2248     History  Chief Complaint  Patient presents with   Abscess    Jaclyn Brooks is a 61 y.o. female.   Abscess Patient presents with swelling and pain on her left shoulder.  States there has been a bump there "for a minute".  Now more red and painful.  No fevers or chills.  States it is just more sensitive.  Hurts to move the arm.  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  No trauma.  States she has an appointment to see a doctor about it and she is not sure if she will have to get it done in the OR or somewhere else.     Home Medications Prior to Admission medications   Medication Sig Start Date End Date Taking? Authorizing Provider  doxycycline (VIBRAMYCIN) 100 MG capsule Take 1 capsule (100 mg total) by mouth 2 (two) times daily. 05/20/22  Yes Davonna Belling, MD  oxyCODONE-acetaminophen (PERCOCET/ROXICET) 5-325 MG tablet Take 1 tablet by mouth every 6 (six) hours as needed for severe pain. 05/20/22  Yes Davonna Belling, MD  albuterol (VENTOLIN HFA) 108 (90 Base) MCG/ACT inhaler Inhale 1-2 puffs into the lungs every 6 (six) hours as needed for wheezing or shortness of breath. 11/26/20   Lindell Spar, MD  allopurinol (ZYLOPRIM) 100 MG tablet Take 2 tablets (200 mg total) by mouth daily. 12/07/21   Lindell Spar, MD  atenolol (TENORMIN) 50 MG tablet Take 1 tablet (50 mg total) by mouth daily. 10/18/21   Lindell Spar, MD  budesonide-formoterol (SYMBICORT) 80-4.5 MCG/ACT inhaler Inhale 2 puffs into the lungs 2 (two) times daily. 10/21/21   Lindell Spar, MD  busPIRone (BUSPAR) 5 MG tablet Take 1 tablet (5 mg total) by mouth 2 (two) times daily. 04/28/22   Fayrene Helper, MD  CARTIA XT 240 MG 24 hr capsule Take 1 capsule by mouth once daily 03/21/22   Lindell Spar, MD  clopidogrel (PLAVIX) 75 MG tablet Take 1 tablet by mouth in the evening  12/06/21   Lindell Spar, MD  guaiFENesin-dextromethorphan (ROBITUSSIN DM) 100-10 MG/5ML syrup Take 5 mLs by mouth every 4 (four) hours as needed for cough. 09/21/21   Noreene Larsson, NP  hydrochlorothiazide (HYDRODIURIL) 25 MG tablet Take 1 tablet (25 mg total) by mouth daily. 10/18/21   Lindell Spar, MD  hydrocortisone (ANUSOL-HC) 2.5 % rectal cream Place 1 application rectally 2 (two) times daily. 07/07/21   Lindell Spar, MD  methocarbamol (ROBAXIN) 500 MG tablet Take 1 tablet (500 mg total) by mouth 2 (two) times daily. 04/13/22   Lindell Spar, MD  ondansetron (ZOFRAN) 4 MG tablet Take 1 tablet (4 mg total) by mouth every 8 (eight) hours as needed for nausea or vomiting. 03/09/22   Lindell Spar, MD  pantoprazole (PROTONIX) 40 MG tablet Take 1 tablet (40 mg total) by mouth daily. 03/09/22   Lindell Spar, MD  predniSONE (DELTASONE) 20 MG tablet Take 2 tablets (40 mg total) by mouth daily with breakfast. 05/09/22   Lindell Spar, MD  sodium chloride (OCEAN) 0.65 % SOLN nasal spray Place 1 spray into both nostrils as needed for congestion. 11/30/21   Lindell Spar, MD  triamcinolone cream (KENALOG) 0.1 % Apply 1 Application topically 2 (two) times daily. 04/13/22   Posey Pronto, Colin Broach,  MD  atorvastatin (LIPITOR) 20 MG tablet Take 1 tablet (20 mg total) by mouth at bedtime. Patient not taking: Reported on 01/24/2020 11/25/19 02/18/20  Alycia Rossetti, MD      Allergies    Bee venom, Nsaids, and Shellfish allergy    Review of Systems   Review of Systems  Physical Exam Updated Vital Signs BP (!) 148/75   Pulse 61   Temp 98 F (36.7 C) (Oral)   Resp 17   Ht '5\' 6"'$  (1.676 m)   Wt 74.8 kg   SpO2 98%   BMI 26.63 kg/m  Physical Exam Vitals and nursing note reviewed.  Cardiovascular:     Rate and Rhythm: Regular rhythm.  Pulmonary:     Breath sounds: Normal breath sounds.  Musculoskeletal:     Comments: Tender firm area to left shoulder laterally and slightly posteriorly.   Approximately 4 cm across.  Does have some areas of purulence on it.  It is erythematous.  Neurological:     Mental Status: She is alert.     ED Results / Procedures / Treatments   Labs (all labs ordered are listed, but only abnormal results are displayed) Labs Reviewed  AEROBIC CULTURE W GRAM STAIN (SUPERFICIAL SPECIMEN)    EKG None  Radiology No results found.  Procedures .Marland KitchenIncision and Drainage  Date/Time: 05/20/2022 3:00 AM  Performed by: Davonna Belling, MD Authorized by: Davonna Belling, MD   Consent:    Consent obtained:  Verbal   Consent given by:  Patient   Risks discussed:  Bleeding, incomplete drainage, pain, damage to other organs and infection   Alternatives discussed:  No treatment, delayed treatment and alternative treatment Location:    Type:  Abscess   Size:  4   Location:  Upper extremity   Upper extremity location:  Shoulder   Shoulder location:  L shoulder Pre-procedure details:    Skin preparation:  Povidone-iodine Sedation:    Sedation type:  None Anesthesia:    Anesthesia method:  Local infiltration Procedure type:    Complexity:  Complex Procedure details:    Incision depth:  Subcutaneous   Wound management:  Probed and deloculated and irrigated with saline   Drainage characteristics: Purulent and fatty.   Drainage amount:  Moderate   Wound treatment:  Wound left open   Packing materials:  None Post-procedure details:    Procedure completion:  Tolerated well, no immediate complications     Medications Ordered in ED Medications  lidocaine (PF) (XYLOCAINE) 1 % injection 30 mL (30 mLs Infiltration Given 05/20/22 0250)    ED Course/ Medical Decision Making/ A&P                           Medical Decision Making Risk Prescription drug management.   Patient with swelling and pain on left shoulder.  Initially thought to be abscess versus potentially an infectious bursitis.  Incision and drainage done.  Showed some purulent but  also had likely cystic with fatty type contents.  Will give antibiotics.  Cultures been sent.  Will give pain medicines.  Wound left open.  Will soak.  Still had some contents that were not able to be removed.  Will have follow-up with general surgery for further evaluation and potentially would need more intervention.  Appears stable for discharge at this time.  Does not appear to require admission to the hospital.  Recently had been on steroids for her foot and potentially be a  cause of why became infected at this time.        Final Clinical Impression(s) / ED Diagnoses Final diagnoses:  Abscess    Rx / DC Orders ED Discharge Orders          Ordered    doxycycline (VIBRAMYCIN) 100 MG capsule  2 times daily        05/20/22 0305    oxyCODONE-acetaminophen (PERCOCET/ROXICET) 5-325 MG tablet  Every 6 hours PRN        05/20/22 0305              Davonna Belling, MD 05/20/22 [phone]

## 2022-05-24 LAB — AEROBIC CULTURE W GRAM STAIN (SUPERFICIAL SPECIMEN): Special Requests: NORMAL

## 2022-05-24 MED FILL — Oxycodone w/ Acetaminophen Tab 5-325 MG: ORAL | Qty: 6 | Status: AC

## 2022-05-25 ENCOUNTER — Telehealth: Payer: Self-pay | Admitting: *Deleted

## 2022-05-25 NOTE — Telephone Encounter (Signed)
Post ED Visit - Positive Culture Follow-up  Culture report reviewed by antimicrobial stewardship pharmacist: [place] Team '[]'$  Elenor Quinones, Pharm.D. '[]'$  Heide Guile, Pharm.D., BCPS AQ-ID '[]'$  Parks Neptune, Pharm.D., BCPS '[]'$  Alycia Rossetti, Pharm.D., BCPS '[]'$  Watts, Pharm.D., BCPS, AAHIVP '[]'$  Legrand Como, Pharm.D., BCPS, AAHIVP '[x]'$  Salome Arnt, PharmD, BCPS '[]'$  Johnnette Gourd, PharmD, BCPS '[]'$  Hughes Better, PharmD, BCPS '[]'$  Leeroy Cha, PharmD '[]'$  Laqueta Linden, PharmD, BCPS '[]'$  Albertina Parr, PharmD  [place] Team '[]'$  Leodis Sias, PharmD '[]'$  Lindell Spar, PharmD '[]'$  Royetta Asal, PharmD '[]'$  Graylin Shiver, Rph '[]'$  Rema Fendt) Glennon Mac, PharmD '[]'$  Arlyn Dunning, PharmD '[]'$  Netta Cedars, PharmD '[]'$  Dia Sitter, PharmD '[]'$  Leone Haven, PharmD '[]'$  Gretta Arab, PharmD '[]'$  Theodis Shove, PharmD '[]'$  Peggyann Juba, PharmD '[]'$  Reuel Boom, PharmD   Positive urine culture Treated with Doxycycline Hyclate, organism sensitive to the same and no further patient follow-up is required at this time.  Harlon Flor [facility] 05/25/2022, 10:10 AM

## 2022-05-27 ENCOUNTER — Encounter: Payer: Self-pay | Admitting: Nurse Practitioner

## 2022-05-27 ENCOUNTER — Ambulatory Visit (INDEPENDENT_AMBULATORY_CARE_PROVIDER_SITE_OTHER): Payer: Medicare Other | Admitting: Nurse Practitioner

## 2022-05-27 DIAGNOSIS — J029 Acute pharyngitis, unspecified: Secondary | ICD-10-CM | POA: Diagnosis not present

## 2022-05-27 LAB — POCT RAPID STREP A (OFFICE): Rapid Strep A Screen: NEGATIVE

## 2022-05-27 NOTE — Assessment & Plan Note (Signed)
Rapid Strep test negative Patient encouraged to drink water , warm soup , gargle with salt water , take tylenol as needed for sore throat  Seek in person evaluation if symptom does not getter better in a week

## 2022-05-27 NOTE — Progress Notes (Signed)
Virtual Visit via Telephone Note  I connected with Jaclyn Brooks @ on 05/27/22 at 1050am by telephone and verified that I am speaking with the correct person using two identifiers.  I spent 7 minutes on this telephone encounter  Location: Patient: home Provider: office   I discussed the limitations, risks, security and privacy concerns of performing an evaluation and management service by telephone and the availability of in person appointments. I also discussed with the patient that there may be a patient responsible charge related to this service. The patient expressed understanding and agreed to proceed.   History of Present Illness: [name] with past medical history of hypertension, COPD, GERD, diabetes mellitus without complication presents with complaints of sore throat since 1 day ago, non productive cough since 2 days ago . Her daughter currently getting treated for strep throat.patient denies fever, chills, body aches Has A little bit of trouble swallowing, need work not to stay at home till Monday    Observations/Objective:   Assessment and Plan: Sore throat Rapid Strep test negative Patient encouraged to drink water , warm soup , gargle with salt water , take tylenol as needed for sore throat  Seek in person evaluation if symptom does not getter better in a week    Follow Up Instructions:    I discussed the assessment and treatment plan with the patient. The patient was provided an opportunity to ask questions and all were answered. The patient agreed with the plan and demonstrated an understanding of the instructions.   The patient was advised to call back or seek an in-person evaluation if the symptoms worsen or if the condition fails to improve as anticipated.

## 2022-05-27 NOTE — Patient Instructions (Signed)
Your strep test was negative  today  Please take over the counter tylenol '650mg'$  every 6 hours as needed for sore throat,  drink warm water , soup. Gargle with salt water.    Thanks for choosing [facility], we consider it a privelige to serve you.

## 2022-05-30 ENCOUNTER — Ambulatory Visit: Payer: Medicare Other | Admitting: Internal Medicine

## 2022-05-31 ENCOUNTER — Ambulatory Visit: Payer: Medicare Other | Admitting: General Surgery

## 2022-05-31 ENCOUNTER — Encounter: Payer: Self-pay | Admitting: General Surgery

## 2022-05-31 VITALS — BP 130/74 | HR 52 | Temp 97.9°F | Resp 16 | Ht 66.0 in | Wt 163.0 lb

## 2022-05-31 DIAGNOSIS — L720 Epidermal cyst: Secondary | ICD-10-CM | POA: Diagnosis not present

## 2022-05-31 NOTE — Progress Notes (Signed)
[facility] History and Physical  Reason for Referral: Left shoulder sebaceous cyst  Referring Physician: Lindell Spar, MD    Chief Complaint   New Patient (Initial Visit)     Jaclyn Brooks is a 61 y.o. female.  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 She has noticed an area on her left shoulder for some time but this had never been a problem until it got inflamed and infected a few weeks back requiring an I&D in the ED. The I&D expressed purulence and she was started on antibiotics which she has finished. 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 She will need to be able to exercise and move her arm to perform this training.  The area has healed up and has no further drainage or redness.   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[facility])     Past Surgical History:  Procedure Laterality Date   ABDOMINAL HYSTERECTOMY     CHOLECYSTECTOMY N/A 01/28/2015   Procedure: LAPAROSCOPIC CHOLECYSTECTOMY;  Surgeon: Aviva Signs Md, MD;  Location: [facility];  Service: General;  Laterality: N/A;   excision of bone spurs Bilateral    Big toes   renal aneurysm Right    right coil-and only has function of left kidney   X-STOP IMPLANTATION      Family History  Problem Relation Age of Onset   Hypertension Mother    Heart disease Mother        Cardiac Arrest    Hypertension Father    Cancer Father    Hypertension Sister    Cancer Brother        Bladder Cancer    Hypertension Brother    Hypertension Sister     Social History   Tobacco Use   Smoking status: Former    Packs/day: 0.25    Years: 20.00    Total pack years: 5.00    Types: Cigarettes    Quit date: 2019    Years since  quitting: 4.6   Smokeless tobacco: Never  Vaping Use   Vaping Use: Never used  Substance Use Topics   Alcohol use: Not Currently    Comment: occasionally   Drug use: No    Medications: I have reviewed the patient's current medications. Allergies as of 05/31/2022       Reactions   Bee Venom Hives   Nsaids    Kidney disease    Shellfish Allergy Itching        Medication List        Accurate as of May 31, 2022 11:37 AM. If you have any questions, ask your nurse or doctor.          STOP taking these medications    doxycycline 100 MG capsule Commonly known as: VIBRAMYCIN Stopped by: Virl Cagey, MD   guaiFENesin-dextromethorphan 100-10 MG/5ML syrup Commonly known as: ROBITUSSIN DM Stopped by: Virl Cagey, MD   ondansetron 4 MG tablet Commonly known as: Zofran Stopped by: Virl Cagey, MD   pantoprazole 40 MG tablet Commonly known as: PROTONIX Stopped by: Lanell Matar  Constance Haw, MD   predniSONE 20 MG tablet Commonly known as: DELTASONE Stopped by: Virl Cagey, MD       TAKE these medications    albuterol 108 (90 Base) MCG/ACT inhaler Commonly known as: VENTOLIN HFA Inhale 1-2 puffs into the lungs every 6 (six) hours as needed for wheezing or shortness of breath.   allopurinol 100 MG tablet Commonly known as: ZYLOPRIM Take 2 tablets (200 mg total) by mouth daily.   atenolol 50 MG tablet Commonly known as: TENORMIN Take 1 tablet (50 mg total) by mouth daily.   budesonide-formoterol 80-4.5 MCG/ACT inhaler Commonly known as: SYMBICORT Inhale 2 puffs into the lungs 2 (two) times daily.   busPIRone 5 MG tablet Commonly known as: BUSPAR Take 1 tablet (5 mg total) by mouth 2 (two) times daily.   Cartia XT 240 MG 24 hr capsule Generic drug: diltiazem Take 1 capsule by mouth once daily   clopidogrel 75 MG tablet Commonly known as: PLAVIX Take 1 tablet by mouth in the evening   dexlansoprazole 60 MG capsule Commonly known as:  DEXILANT Take by mouth daily.   hydrochlorothiazide 12.5 MG tablet Commonly known as: HYDRODIURIL Take 12.5 mg by mouth daily. What changed: Another medication with the same name was removed. Continue taking this medication, and follow the directions you see here. Changed by: Virl Cagey, MD   hydrocortisone 2.5 % rectal cream Commonly known as: Anusol-HC Place 1 application rectally 2 (two) times daily.   methocarbamol 500 MG tablet Commonly known as: ROBAXIN Take 1 tablet (500 mg total) by mouth 2 (two) times daily.   oxyCODONE-acetaminophen 5-325 MG tablet Commonly known as: PERCOCET/ROXICET Take 1 tablet by mouth every 6 (six) hours as needed for severe pain.   sodium chloride 0.65 % Soln nasal spray Commonly known as: OCEAN Place 1 spray into both nostrils as needed for congestion.   triamcinolone cream 0.1 % Commonly known as: KENALOG Apply 1 Application topically 2 (two) times daily.         ROS:  A comprehensive review of systems was negative except for: Cardiovascular: positive for HTN Musculoskeletal: positive for left shoulder cyst  Blood pressure 130/74, pulse (!) 52, temperature 97.9 F (36.6 C), temperature source Oral, resp. rate 16, height '5\' 6"'$  (1.676 m), weight 163 lb (73.9 kg), SpO2 98 %. Physical Exam Vitals reviewed.  HENT:     Head: Normocephalic.     Nose: Nose normal.  Eyes:     Extraocular Movements: Extraocular movements intact.  Cardiovascular:     Rate and Rhythm: Normal rate.  Pulmonary:     Effort: Pulmonary effort is normal.  Abdominal:     General: There is no distension.     Palpations: Abdomen is soft.     Tenderness: There is no abdominal tenderness.  Musculoskeletal:        General: Normal range of motion.     Cervical back: Normal range of motion.     Comments: Left shoulder just posterior to midline superior aspect, there is an indurated area and healing incision site, no erythema or drainage  Skin:    General:  Skin is warm.  Neurological:     General: No focal deficit present.     Cranial Nerves: Cranial nerve deficit present.  Psychiatric:        Mood and Affect: Mood normal.        Thought Content: Thought content normal.     Results: Noen   Assessment & Plan:  Jaclyn Brooks is a 61 y.o. female with a sebaceous cyst over her shoulder s/p I&D. Discussed that her cyst could get infected again and recur. Discussed excision to remove the wall to prevent it from recurring. Discussed likely need to not have to use her arm for extremely heavy lifting or activity after it is done and having sutures in for 2 weeks.   Dates given for in office procedure. Discussed risk of bleeding, infection and finding something other than a cyst. She will call us with a date.   All questions were answered to the satisfaction of the patient. If it  gets infected or red/swollen before the time of excision we will send in some more antibiotics to get the inflammation down before excision.    Virl Cagey 05/31/2022, 11:37 AM

## 2022-05-31 NOTE — Patient Instructions (Signed)
Dates we can remove the cyst: 10/5, 10/29, 10/26, 11/9, 11/16, 11/30  Epidermoid Cyst  An epidermoid cyst, also known as epidermal cyst, is a sac made of skin tissue. The sac contains a substance called keratin. Keratin is a protein that is normally secreted through the hair follicles. When keratin becomes trapped in the top layer of skin (epidermis), it can form an epidermoid cyst. Epidermoid cysts can be found anywhere on your body. These cysts are usually harmless (benign), and they may not cause symptoms unless they become inflamed or infected. What are the causes? This condition may be caused by: A blocked hair follicle. A hair that curls and re-enters the skin instead of growing straight out of the skin (ingrown hair). A blocked pore. Irritated skin. An injury to the skin. Certain conditions that are passed along from parent to child (inherited). Human papillomavirus (HPV). This happens rarely when cysts occur on the bottom of the feet. Long-term (chronic) sun damage to the skin. What increases the risk? The following factors may make you more likely to develop an epidermoid cyst: Having acne. Being female. Having an injury to the skin. Being past puberty. Having certain rare genetic disorders. What are the signs or symptoms? The only symptom of this condition may be a small, painless lump underneath the skin. When an epidermal cyst ruptures, it may become inflamed. True infection in cysts is rare. Symptoms may include: Redness. Inflammation. Tenderness. Warmth. Keratin draining from the cyst. Keratin is grayish-white, bad-smelling substance. Pus draining from the cyst. How is this diagnosed? This condition is diagnosed with a physical exam. In some cases, you may have a sample of tissue (biopsy) taken from your cyst to be examined under a microscope or tested for bacteria. You may be referred to a health care provider who specializes in skin care (dermatologist). How is this  treated? If a cyst becomes inflamed, treatment may include: Opening and draining the cyst, done by a health care provider. After draining, minor surgery to remove the rest of the cyst may be done. Taking antibiotic medicine. Having injections of medicines (steroids) that help to reduce inflammation. Having surgery to remove the cyst. Surgery may be done if the cyst: Becomes large. Bothers you. Has a chance of turning into cancer. Do not try to open a cyst yourself. Follow these instructions at home: Medicines If you were prescribed an antibiotic medicine, take it it as told by your health care provider. Do not stop using the antibiotic even if you start to feel better. Take over-the-counter and prescription medicines only as told by your health care provider. General instructions Keep the area around your cyst clean and dry. Wear loose, dry clothing. Avoid touching your cyst. Check your cyst every day for signs of infection. Check for: Redness, swelling, or pain. Fluid or blood. Warmth. Pus or a bad smell. Keep all follow-up visits. This is important. How is this prevented? Wear clean, dry, clothing. Avoid wearing tight clothing. Keep your skin clean and dry. Take showers or baths every day. Contact a health care provider if: Your cyst develops symptoms of infection. Your condition is not improving or is getting worse. You develop a cyst that looks different from other cysts you have had. You have a fever. Get help right away if: Redness spreads from the cyst into the surrounding area. Summary An epidermoid cyst is a sac made of skin tissue. These cysts are usually harmless (benign), and they may not cause symptoms unless they become inflamed. If  a cyst becomes inflamed, treatment may include surgery to open and drain the cyst, or to remove it. Treatment may also include medicines by mouth or through an injection. Take over-the-counter and prescription medicines only as told by  your health care provider. If you were prescribed an antibiotic medicine, take it as told by your health care provider. Do not stop using the antibiotic even if you start to feel better. Contact a health care provider if your condition is not improving or is getting worse. Keep all follow-up visits as told by your health care provider. This is important. This information is not intended to replace advice given to you by your health care provider. Make sure you discuss any questions you have with your health care provider. Document Revised: 01/01/2020 Document Reviewed: 01/01/2020 Elsevier Patient Education  [place].

## 2022-06-01 ENCOUNTER — Other Ambulatory Visit: Payer: Medicare Other

## 2022-06-03 ENCOUNTER — Other Ambulatory Visit: Payer: Self-pay | Admitting: Internal Medicine

## 2022-06-03 ENCOUNTER — Other Ambulatory Visit: Payer: Medicare Other

## 2022-06-03 DIAGNOSIS — I6523 Occlusion and stenosis of bilateral carotid arteries: Secondary | ICD-10-CM

## 2022-06-03 DIAGNOSIS — I10 Essential (primary) hypertension: Secondary | ICD-10-CM

## 2022-06-14 ENCOUNTER — Ambulatory Visit: Payer: Medicare Other | Admitting: Internal Medicine

## 2022-06-15 ENCOUNTER — Ambulatory Visit: Payer: Medicare Other | Admitting: Internal Medicine

## 2022-06-22 ENCOUNTER — Encounter: Payer: Self-pay | Admitting: Internal Medicine

## 2022-06-22 ENCOUNTER — Ambulatory Visit (INDEPENDENT_AMBULATORY_CARE_PROVIDER_SITE_OTHER): Payer: Medicare Other | Admitting: Internal Medicine

## 2022-06-22 DIAGNOSIS — J0110 Acute frontal sinusitis, unspecified: Secondary | ICD-10-CM

## 2022-06-22 DIAGNOSIS — J441 Chronic obstructive pulmonary disease with (acute) exacerbation: Secondary | ICD-10-CM

## 2022-06-22 MED ORDER — AZITHROMYCIN 250 MG PO TABS: ORAL_TABLET | ORAL | 0 refills | Status: AC

## 2022-06-22 NOTE — Progress Notes (Signed)
Virtual Visit via Telephone Note   This visit type was conducted due to national recommendations for restrictions regarding the COVID-19 Pandemic (e.g. social distancing) in an effort to limit this patient's exposure and mitigate transmission in our community.  Due to her co-morbid illnesses, this patient is at least at moderate risk for complications without adequate follow up.  This format is felt to be most appropriate for this patient at this time.  The patient did not have access to video technology/had technical difficulties with video requiring transitioning to audio format only (telephone).  All issues noted in this document were discussed and addressed.  No physical exam could be performed with this format.  Evaluation Performed:  Follow-up visit  Date:  06/22/2022   ID:  Jaclyn Brooks, DOB 05-06-1961, MRN [id number]  Patient Location: Home Provider Location: Office/Clinic  Participants: Patient Location of Patient: Home Location of Provider: Telehealth Consent was obtain for visit to be over via telehealth. I verified that I am speaking with the correct person using two identifiers.  PCP:  Lindell Spar, MD   Chief Complaint: Nasal congestion, cough and dyspnea  History of Present Illness:    Jaclyn Brooks is a 61 y.o. female who has a televisit for complaint of nasal congestion, cough, sneezing and mild dyspnea with wheezing for the last 3 days.  Her home COVID test was negative.  She denies any fever or chills.  She has cough with whitish expectoration and has had nasal drainage with blood in it.  She has tried using Mucinex with some relief.  The patient does have symptoms concerning for COVID-19 infection (fever, chills, cough, or new shortness of breath).   Past Medical, Surgical, Social History, Allergies, and Medications have been Reviewed.  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   allopurinol (ZYLOPRIM) 100 MG tablet Take 2 tablets (200 mg total) by mouth daily.   atenolol (TENORMIN) 50 MG tablet Take 1 tablet by mouth once daily   budesonide-formoterol (SYMBICORT) 80-4.5 MCG/ACT inhaler Inhale 2 puffs into the lungs 2 (two) times daily.   busPIRone (BUSPAR) 5 MG tablet Take 1 tablet (5 mg total) by mouth 2 (two) times daily.   CARTIA XT 240 MG 24 hr capsule Take 1 capsule by mouth once daily   clopidogrel (PLAVIX) 75 MG tablet Take 1 tablet by mouth in the evening   dexlansoprazole (DEXILANT) 60 MG capsule Take by mouth daily.   hydrochlorothiazide (HYDRODIURIL) 12.5 MG tablet Take 12.5 mg by mouth daily.   hydrocortisone (ANUSOL-HC) 2.5 % rectal cream Place 1  application rectally 2 (two) times daily.   methocarbamol (ROBAXIN) 500 MG tablet Take 1 tablet (500 mg total) by mouth 2 (two) times daily.   oxyCODONE-acetaminophen (PERCOCET/ROXICET) 5-325 MG tablet Take 1 tablet by mouth every 6 (six) hours as needed for severe pain.   sodium chloride (OCEAN) 0.65 % SOLN nasal spray Place 1 spray into both nostrils as needed for congestion.   triamcinolone cream (KENALOG) 0.1 % Apply 1 Application topically 2 (two) times daily.   azithromycin (ZITHROMAX) 250 MG tablet Take 2 tablets on day 1, then 1  tablet daily on days 2 through 5     Allergies:   Bee venom, Nsaids, and Shellfish allergy   ROS:   Please see the history of present illness.     All other systems reviewed and are negative.   Labs/Other Tests and Data Reviewed:    Recent Labs: No results found for requested labs within last 365 days.   Recent Lipid Panel Lab Results  Component Value Date/Time   CHOL 157 06/19/2020 08:50 AM   TRIG 287 (H) 06/19/2020 08:50 AM   HDL 24 (L) 06/19/2020 08:50 AM   CHOLHDL 6.5 (H) 06/19/2020 08:50 AM   LDLCALC 92 06/19/2020 08:50 AM    Wt Readings from Last 3 Encounters:  05/31/22 163 lb (73.9 kg)  05/19/22 165 lb (74.8 kg)  04/28/22 164 lb 1.3 oz (74.4 kg)     ASSESSMENT & PLAN:    Acute sinusitis COPD exacerbation Started azithromycin as she has persistent symptoms despite symptomatic treatment and for COPD exacerbation Mucinex as needed for cough Continue Symbicort and as needed albuterol Advised to perform COVID test at local pharmacy and contact if positive   Time:   Today, I have spent 9 minutes reviewing the chart, including problem list, medications, and with the patient with telehealth technology discussing the above problems.   Medication Adjustments/Labs and Tests Ordered: Current medicines are reviewed at length with the patient today.  Concerns regarding medicines are outlined above.   Tests Ordered: No orders of the defined types were placed in this encounter.   Medication Changes: Meds ordered this encounter  Medications   azithromycin (ZITHROMAX) 250 MG tablet    Sig: Take 2 tablets on day 1, then 1 tablet daily on days 2 through 5    Dispense:  6 tablet    Refill:  0     Note: This dictation was prepared with Dragon dictation along with smaller phrase technology. Similar sounding words can be transcribed inadequately or may not be corrected upon review. Any transcriptional errors that result from this process are unintentional.       Disposition:  Follow up  Signed, Lindell Spar, MD  06/22/2022 10:35 AM     [place]

## 2022-07-02 ENCOUNTER — Other Ambulatory Visit: Payer: Self-pay | Admitting: Internal Medicine

## 2022-07-02 DIAGNOSIS — I10 Essential (primary) hypertension: Secondary | ICD-10-CM

## 2022-07-04 ENCOUNTER — Encounter: Payer: Self-pay | Admitting: *Deleted

## 2022-07-04 DIAGNOSIS — Z90710 Acquired absence of both cervix and uterus: Secondary | ICD-10-CM | POA: Insufficient documentation

## 2022-07-13 ENCOUNTER — Encounter: Payer: Self-pay | Admitting: Internal Medicine

## 2022-07-13 ENCOUNTER — Ambulatory Visit (INDEPENDENT_AMBULATORY_CARE_PROVIDER_SITE_OTHER): Payer: Medicare Other | Admitting: Internal Medicine

## 2022-07-13 VITALS — BP 136/82 | HR 67 | Resp 18 | Ht 66.0 in | Wt 169.0 lb

## 2022-07-13 DIAGNOSIS — S8011XA Contusion of right lower leg, initial encounter: Secondary | ICD-10-CM | POA: Insufficient documentation

## 2022-07-13 DIAGNOSIS — Z2821 Immunization not carried out because of patient refusal: Secondary | ICD-10-CM | POA: Diagnosis not present

## 2022-07-13 DIAGNOSIS — I6523 Occlusion and stenosis of bilateral carotid arteries: Secondary | ICD-10-CM

## 2022-07-13 DIAGNOSIS — E119 Type 2 diabetes mellitus without complications: Secondary | ICD-10-CM

## 2022-07-13 DIAGNOSIS — H60501 Unspecified acute noninfective otitis externa, right ear: Secondary | ICD-10-CM | POA: Diagnosis not present

## 2022-07-13 DIAGNOSIS — I10 Essential (primary) hypertension: Secondary | ICD-10-CM

## 2022-07-13 DIAGNOSIS — N1832 Chronic kidney disease, stage 3b: Secondary | ICD-10-CM

## 2022-07-13 LAB — POCT GLYCOSYLATED HEMOGLOBIN (HGB A1C)
HbA1c POC (<> result, manual entry): 5.7 % (ref 4.0–5.6)
HbA1c, POC (controlled diabetic range): 5.7 % (ref 0.0–7.0)
HbA1c, POC (prediabetic range): 5.7 % (ref 5.7–6.4)

## 2022-07-13 MED ORDER — OFLOXACIN 0.3 % OT SOLN: 5.0000 [drp] | Freq: Every day | OTIC | 0 refills | Status: DC
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
## 2022-07-13 NOTE — Patient Instructions (Signed)
Please apply ice over the hematoma. If it does not start improving over the next 2 weeks, please contact us.  Please apply Ofloxacin ear drops as prescribed.

## 2022-07-13 NOTE — Assessment & Plan Note (Signed)
Last BMP reviewed in chart Follows up with Nephrologist - Dr. Justin Mend Avoid nephrotoxic agents

## 2022-07-13 NOTE — Assessment & Plan Note (Signed)
Lab Results  Component Value Date   HGBA1C 5.7 07/13/2022   HGBA1C 5.7 07/13/2022   HGBA1C 5.7 07/13/2022   Diet controlled Not on statin, does not prefer to take any new medicine

## 2022-07-13 NOTE — Assessment & Plan Note (Signed)
BP Readings from Last 1 Encounters:  07/13/22 136/82   Well-controlled with Atenolol and Cartia Counseled for compliance with the medications Advised DASH diet and moderate exercise/walking, at least 150 mins/week
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
## 2022-07-13 NOTE — Assessment & Plan Note (Signed)
On Plavix Not on statin currently, resistant for starting new medications

## 2022-07-13 NOTE — Assessment & Plan Note (Signed)
Left ear pain/tenderness and intermittent ear discharge Started ofloxacin eardrops

## 2022-07-15 LAB — MICROALBUMIN / CREATININE URINE RATIO
Creatinine, Urine: 224.5 mg/dL
Microalb/Creat Ratio: 592 mg/g creat — ABNORMAL HIGH (ref 0–29)
Microalbumin, Urine: 1329.9 ug/mL

## 2022-07-19 ENCOUNTER — Ambulatory Visit: Payer: Medicare Other | Admitting: Internal Medicine

## 2022-07-20 ENCOUNTER — Encounter: Payer: Self-pay | Admitting: Internal Medicine

## 2022-07-20 ENCOUNTER — Ambulatory Visit (INDEPENDENT_AMBULATORY_CARE_PROVIDER_SITE_OTHER): Payer: Medicare Other | Admitting: Internal Medicine

## 2022-07-20 VITALS — BP 132/82 | HR 59 | Resp 18 | Ht 66.0 in | Wt 169.8 lb

## 2022-07-20 DIAGNOSIS — F411 Generalized anxiety disorder: Secondary | ICD-10-CM | POA: Diagnosis not present

## 2022-07-20 DIAGNOSIS — S8011XA Contusion of right lower leg, initial encounter: Secondary | ICD-10-CM

## 2022-07-20 MED ORDER — BUSPIRONE HCL 5 MG PO TABS: 5.0000 mg | ORAL_TABLET | Freq: Two times a day (BID) | ORAL | 3 refills | Status: DC

## 2022-07-20 NOTE — Patient Instructions (Addendum)
Please start taking Buspirone twice daily.  Please try to contact therapist at your workplace for counseling.  Please continue taking other medications as prescribed.

## 2022-07-20 NOTE — Assessment & Plan Note (Signed)
    07/20/2022   11:16 AM 04/28/2022    8:20 AM  GAD 7 : Generalized Anxiety Score  Nervous, Anxious, on Edge 1 2  Control/stop worrying 3 3  Worry too much - different things 3 3  Trouble relaxing 3 3  Restless 3 2  Easily annoyed or irritable 2 2  Afraid - awful might happen 1 0  Total GAD 7 Score 16 15  Anxiety Difficulty Somewhat difficult    Started BuSpar 5 mg BID Relaxation material provided Advised to contact therapist/counselor at her workplace

## 2022-07-20 NOTE — Progress Notes (Signed)
Acute Office Visit  Subjective:    Patient ID: Jaclyn Brooks, female    DOB: August 08, 1961, 61 y.o.   MRN: [id number]  Chief Complaint  Patient presents with   Anxiety    Pt having anxiety is dealing with a lot of family issues right now     HPI Patient is in today for complaint of anxiety due to her stress at home.  She has been taking care of her father, but her sister recently took him with her without letting the patient know.  She is currently dealing with legal team to get his father back under her care.  She was placed on BuSpar recently for GAD, which she took for 1 month and felt better with it, but she does not have it currently.  She denies SI or HI currently.  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[facility])     Past Surgical History:  Procedure Laterality Date   CHOLECYSTECTOMY N/A 01/28/2015   Procedure: LAPAROSCOPIC CHOLECYSTECTOMY;  Surgeon: Aviva Signs Md, MD;  Location: [facility];  Service: General;  Laterality: N/A;   excision of bone spurs Bilateral    Big toes   renal aneurysm Right    right coil-and only has function of left kidney   TOTAL ABDOMINAL HYSTERECTOMY     X-STOP IMPLANTATION      Family History  Problem Relation Age of Onset   Hypertension Mother    Heart disease Mother        Cardiac Arrest    Hypertension Father    Cancer Father    Hypertension Sister    Cancer Brother        Bladder Cancer    Hypertension Brother    Hypertension Sister     Social History   Socioeconomic History   Marital status: Unknown    Spouse name: Not on file   Number of children: 1   Years of education: 12   Highest education level: Some college, no degree  Occupational History   Occupation: Retired  Tobacco Use   Smoking status: Former    Packs/day: 0.25    Years: 20.00     Total pack years: 5.00    Types: Cigarettes    Quit date: 2019    Years since quitting: 4.7   Smokeless tobacco: Never  Vaping Use   Vaping Use: Never used  Substance and Sexual Activity   Alcohol use: Not Currently    Comment: occasionally   Drug use: No   Sexual activity: Yes    Birth control/protection: Surgical  Other Topics Concern   Not on file  Social History Narrative   Not on file   Social Determinants of Health   Financial Resource Strain: Low Risk  (10/19/2021)   Overall Financial Resource Strain (CARDIA)    Difficulty of Paying Living Expenses: Not hard at all  Food Insecurity: No Food Insecurity (10/19/2021)   Hunger Vital Sign    Worried About Running Out of Food in the Last Year: Never true    Ran Out of Food in the Last Year: Never true  Transportation Needs: No Transportation Needs (10/19/2021)   PRAPARE - Hydrologist (Medical): No    Lack of Transportation (  Non-Medical): No  Physical Activity: Sufficiently Active (10/19/2021)   Exercise Vital Sign    Days of Exercise per Week: 5 days    Minutes of Exercise per Session: 30 min  Stress: No Stress Concern Present (10/19/2021)   [place]    Feeling of Stress : Not at all  Social Connections: Moderately Isolated (10/19/2021)   Social Connection and Isolation Panel [NHANES]    Frequency of Communication with Friends and Family: More than three times a week    Frequency of Social Gatherings with Friends and Family: More than three times a week    Attends Religious Services: More than 4 times per year    Active Member of [employer] or Organizations: No    Attends Archivist Meetings: Never    Marital Status: Never married  Intimate Partner Violence: Not At Risk (10/19/2021)   Humiliation, Afraid, Rape, and Kick questionnaire    Fear of Current or Ex-Partner: No    Emotionally Abused: No    Physically Abused: No     Sexually Abused: No    Outpatient Medications Prior to Visit  Medication Sig Dispense Refill   albuterol (VENTOLIN HFA) 108 (90 Base) MCG/ACT inhaler Inhale 1-2 puffs into the lungs every 6 (six) hours as needed for wheezing or shortness of breath. 1 each 2   allopurinol (ZYLOPRIM) 100 MG tablet Take 2 tablets (200 mg total) by mouth daily. 60 tablet 5   atenolol (TENORMIN) 50 MG tablet Take 1 tablet by mouth once daily 90 tablet 0   budesonide-formoterol (SYMBICORT) 80-4.5 MCG/ACT inhaler Inhale 2 puffs into the lungs 2 (two) times daily. 1 each 2   CARTIA XT 240 MG 24 hr capsule Take 1 capsule by mouth once daily 90 capsule 0   clopidogrel (PLAVIX) 75 MG tablet Take 1 tablet by mouth in the evening 90 tablet 0   dexlansoprazole (DEXILANT) 60 MG capsule Take by mouth daily.     hydrochlorothiazide (HYDRODIURIL) 12.5 MG tablet Take 12.5 mg by mouth daily.     hydrocortisone (ANUSOL-HC) 2.5 % rectal cream Place 1 application rectally 2 (two) times daily. 30 g 0   methocarbamol (ROBAXIN) 500 MG tablet Take 1 tablet (500 mg total) by mouth 2 (two) times daily. 30 tablet 1   ofloxacin (FLOXIN OTIC) 0.3 % OTIC solution Place 5 drops into the right ear daily. 5 mL 0   oxyCODONE-acetaminophen (PERCOCET/ROXICET) 5-325 MG tablet Take 1 tablet by mouth every 6 (six) hours as needed for severe pain. 6 tablet 0   sodium chloride (OCEAN) 0.65 % SOLN nasal spray Place 1 spray into both nostrils as needed for congestion. 15 mL 0   triamcinolone cream (KENALOG) 0.1 % Apply 1 Application topically 2 (two) times daily. 30 g 0   busPIRone (BUSPAR) 5 MG tablet Take 1 tablet (5 mg total) by mouth 2 (two) times daily. 60 tablet 0   No facility-administered medications prior to visit.    Allergies  Allergen Reactions   Bee Venom Hives   Nsaids     Kidney disease    Shellfish Allergy Itching    Review of Systems  Constitutional:  Negative for chills and fever.  HENT:  Negative for sinus pressure and sinus  pain.   Eyes:  Negative for pain and discharge.  Respiratory:  Negative for cough and shortness of breath.   Cardiovascular:  Negative for chest pain and palpitations.  Gastrointestinal:  Negative for abdominal pain, diarrhea  and vomiting.  Endocrine: Negative for polydipsia and polyuria.  Genitourinary:  Negative for dysuria and hematuria.  Musculoskeletal:  Positive for arthralgias, back pain and neck pain. Negative for neck stiffness.  Skin:  Negative for rash.  Neurological:  Negative for dizziness and weakness.  Psychiatric/Behavioral:  Positive for agitation. Negative for behavioral problems. The patient is nervous/anxious.        Objective:    Physical Exam Vitals reviewed.  Constitutional:      General: She is not in acute distress.    Appearance: She is not diaphoretic.  HENT:     Head: Normocephalic and atraumatic.     Right Ear: No middle ear effusion. There is no impacted cerumen.     Left Ear: Tenderness present.  No middle ear effusion. There is no impacted cerumen.     Mouth/Throat:     Mouth: Mucous membranes are moist.  Eyes:     General: No scleral icterus.    Extraocular Movements: Extraocular movements intact.  Cardiovascular:     Rate and Rhythm: Normal rate and regular rhythm.     Pulses: Normal pulses.     Heart sounds: Normal heart sounds. No murmur heard. Pulmonary:     Breath sounds: Normal breath sounds. No wheezing or rales.  Musculoskeletal:     Cervical back: Neck supple. No tenderness.     Right lower leg: No edema.     Left lower leg: No edema.  Skin:    General: Skin is warm.     Findings: No rash.     Comments: Hematoma about 3 cm in diameter over medial aspect of right leg Inclusion cyst over left shoulder  Neurological:     General: No focal deficit present.     Mental Status: She is alert and oriented to person, place, and time.  Psychiatric:        Mood and Affect: Mood normal.        Behavior: Behavior normal.     BP 132/82  (BP Location: Right Arm, Patient Position: Sitting, Cuff Size: Normal)   Pulse (!) 59   Resp 18   Ht '5\' 6"'$  (1.676 m)   Wt 169 lb 12.8 oz (77 kg)   SpO2 100%   BMI 27.41 kg/m  Wt Readings from Last 3 Encounters:  07/20/22 169 lb 12.8 oz (77 kg)  07/13/22 169 lb (76.7 kg)  05/31/22 163 lb (73.9 kg)        Assessment & Plan:   Problem List Items Addressed This Visit       Other   GAD (generalized anxiety disorder) - Primary       07/20/2022   11:16 AM 04/28/2022    8:20 AM  GAD 7 : Generalized Anxiety Score  Nervous, Anxious, on Edge 1 2  Control/stop worrying 3 3  Worry too much - different things 3 3  Trouble relaxing 3 3  Restless 3 2  Easily annoyed or irritable 2 2  Afraid - awful might happen 1 0  Total GAD 7 Score 16 15  Anxiety Difficulty Somewhat difficult   Started BuSpar 5 mg BID Relaxation material provided Advised to contact therapist/counselor at her workplace      Relevant Medications   busPIRone (BUSPAR) 5 MG tablet   Traumatic hematoma of right lower leg    Left leg mass likely traumatic hematoma from recent injury during self-defense class Slightly improved compared to the last visit Advised to apply ice Tylenol as needed She is  on Plavix - risk factor for delayed resolution of hematoma        Meds ordered this encounter  Medications   busPIRone (BUSPAR) 5 MG tablet    Sig: Take 1 tablet (5 mg total) by mouth 2 (two) times daily.    Dispense:  60 tablet    Refill:  3     Iviana Blasingame Keith Rake, MD

## 2022-07-20 NOTE — Assessment & Plan Note (Signed)
Left leg mass likely traumatic hematoma from recent injury during self-defense class Slightly improved compared to the last visit Advised to apply ice Tylenol as needed She is on Plavix - risk factor for delayed resolution of hematoma

## 2022-08-14 ENCOUNTER — Other Ambulatory Visit: Payer: Self-pay | Admitting: Internal Medicine

## 2022-08-14 DIAGNOSIS — I10 Essential (primary) hypertension: Secondary | ICD-10-CM

## 2022-08-15 ENCOUNTER — Ambulatory Visit (INDEPENDENT_AMBULATORY_CARE_PROVIDER_SITE_OTHER): Payer: Medicare Other | Admitting: Internal Medicine

## 2022-08-15 ENCOUNTER — Encounter: Payer: Self-pay | Admitting: Internal Medicine

## 2022-08-15 VITALS — BP 138/88 | HR 55 | Ht 66.0 in | Wt 170.1 lb

## 2022-08-15 DIAGNOSIS — I10 Essential (primary) hypertension: Secondary | ICD-10-CM | POA: Diagnosis not present

## 2022-08-15 DIAGNOSIS — G43119 Migraine with aura, intractable, without status migrainosus: Secondary | ICD-10-CM | POA: Diagnosis not present

## 2022-08-15 DIAGNOSIS — F411 Generalized anxiety disorder: Secondary | ICD-10-CM

## 2022-08-15 MED ORDER — BUTALBITAL-APAP-CAFFEINE 50-325-40 MG PO TABS: 1.0000 | ORAL_TABLET | Freq: Two times a day (BID) | ORAL | 0 refills | Status: DC | PRN

## 2022-08-15 NOTE — Patient Instructions (Signed)
Please take Fioricet as needed for migraine.

## 2022-08-15 NOTE — Progress Notes (Signed)
Acute Office Visit  Subjective:    Patient ID: Jaclyn Brooks, female    DOB: 04-15-1961, 61 y.o.   MRN: [id number]  Chief Complaint  Patient presents with   Migraine    Migraines starting to come back x 1 month ago, patient states she had these years ago     HPI Patient is in today for complaint of unilateral headache, intermittent, 2-3 times in a week, lasting for few hours to a day, sharp and is radiating to the neck area.  She has prodromal symptoms such as dizziness before the headache starts.  She also reports mild nausea, photophobia and phonophobia.  She has remote h/o migraine.  She has tried Tylenol with mild relief.  She is currently undergoing stress at home for the custody of her father.  She has also started a new job at prison, but states that she is not stressed due to it.  Her BP was elevated today, but she attributes it to headache and recent stress.  She is currently taking Cartia and atenolol.  Her heart rate remains in 50s.  Denies any chest pain, dyspnea or palpitations.  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[facility])     Past Surgical History:  Procedure Laterality Date   CHOLECYSTECTOMY N/A 01/28/2015   Procedure: LAPAROSCOPIC CHOLECYSTECTOMY;  Surgeon: Aviva Signs Md, MD;  Location: [facility];  Service: General;  Laterality: N/A;   excision of bone spurs Bilateral    Big toes   renal aneurysm Right    right coil-and only has function of left kidney   TOTAL ABDOMINAL HYSTERECTOMY     X-STOP IMPLANTATION      Family History  Problem Relation Age of Onset   Hypertension Mother    Heart disease Mother        Cardiac Arrest    Hypertension Father    Cancer Father    Hypertension Sister    Cancer Brother        Bladder Cancer    Hypertension Brother    Hypertension Sister      Social History   Socioeconomic History   Marital status: Unknown    Spouse name: Not on file   Number of children: 1   Years of education: 12   Highest education level: Some college, no degree  Occupational History   Occupation: Retired  Tobacco Use   Smoking status: Former    Packs/day: 0.25    Years: 20.00    Total pack years: 5.00    Types: Cigarettes    Quit date: 2019    Years since quitting: 4.8   Smokeless tobacco: Never  Vaping Use   Vaping Use: Never used  Substance and Sexual Activity   Alcohol use: Not Currently    Comment: occasionally   Drug use: No   Sexual activity: Yes    Birth control/protection: Surgical  Other Topics Concern   Not on file  Social History Narrative   Not on file   Social Determinants of Health   Financial Resource Strain: Low Risk  (10/19/2021)   Overall Financial Resource Strain (CARDIA)    Difficulty of Paying Living Expenses: Not hard at all  Food Insecurity: No Food Insecurity (10/19/2021)   Hunger Vital Sign  Worried About Charity fundraiser in the Last Year: Never true    [place] in the Last Year: Never true  Transportation Needs: No Transportation Needs (10/19/2021)   PRAPARE - Hydrologist (Medical): No    Lack of Transportation (Non-Medical): No  Physical Activity: Sufficiently Active (10/19/2021)   Exercise Vital Sign    Days of Exercise per Week: 5 days    Minutes of Exercise per Session: 30 min  Stress: No Stress Concern Present (10/19/2021)   [place]    Feeling of Stress : Not at all  Social Connections: Moderately Isolated (10/19/2021)   Social Connection and Isolation Panel [NHANES]    Frequency of Communication with Friends and Family: More than three times a week    Frequency of Social Gatherings with Friends and Family: More than three times a week    Attends Religious Services: More than 4 times per  year    Active Member of [employer] or Organizations: No    Attends Archivist Meetings: Never    Marital Status: Never married  Intimate Partner Violence: Not At Risk (10/19/2021)   Humiliation, Afraid, Rape, and Kick questionnaire    Fear of Current or Ex-Partner: No    Emotionally Abused: No    Physically Abused: No    Sexually Abused: No    Outpatient Medications Prior to Visit  Medication Sig Dispense Refill   albuterol (VENTOLIN HFA) 108 (90 Base) MCG/ACT inhaler Inhale 1-2 puffs into the lungs every 6 (six) hours as needed for wheezing or shortness of breath. 1 each 2   allopurinol (ZYLOPRIM) 100 MG tablet Take 2 tablets (200 mg total) by mouth daily. 60 tablet 5   atenolol (TENORMIN) 50 MG tablet Take 1 tablet by mouth once daily 90 tablet 0   budesonide-formoterol (SYMBICORT) 80-4.5 MCG/ACT inhaler Inhale 2 puffs into the lungs 2 (two) times daily. 1 each 2   busPIRone (BUSPAR) 5 MG tablet Take 1 tablet (5 mg total) by mouth 2 (two) times daily. 60 tablet 3   CARTIA XT 240 MG 24 hr capsule Take 1 capsule by mouth once daily 90 capsule 0   clopidogrel (PLAVIX) 75 MG tablet Take 1 tablet by mouth in the evening 90 tablet 0   dexlansoprazole (DEXILANT) 60 MG capsule Take by mouth daily.     hydrochlorothiazide (HYDRODIURIL) 12.5 MG tablet Take 12.5 mg by mouth daily.     hydrocortisone (ANUSOL-HC) 2.5 % rectal cream Place 1 application rectally 2 (two) times daily. 30 g 0   methocarbamol (ROBAXIN) 500 MG tablet Take 1 tablet (500 mg total) by mouth 2 (two) times daily. 30 tablet 1   sodium chloride (OCEAN) 0.65 % SOLN nasal spray Place 1 spray into both nostrils as needed for congestion. 15 mL 0   triamcinolone cream (KENALOG) 0.1 % Apply 1 Application topically 2 (two) times daily. 30 g 0   ofloxacin (FLOXIN OTIC) 0.3 % OTIC solution Place 5 drops into the right ear daily. 5 mL 0   oxyCODONE-acetaminophen (PERCOCET/ROXICET) 5-325 MG tablet Take 1 tablet by mouth every 6 (six)  hours as needed for severe pain. (Patient not taking: Reported on 08/15/2022) 6 tablet 0   No facility-administered medications prior to visit.    Allergies  Allergen Reactions   Bee Venom Hives   Nsaids     Kidney disease    Shellfish Allergy Itching    Review  of Systems  Constitutional:  Negative for chills and fever.  HENT:  Negative for sinus pressure and sinus pain.   Eyes:  Negative for pain and discharge.  Respiratory:  Negative for cough and shortness of breath.   Cardiovascular:  Negative for chest pain and palpitations.  Gastrointestinal:  Positive for nausea. Negative for abdominal pain, diarrhea and vomiting.  Endocrine: Negative for polydipsia and polyuria.  Genitourinary:  Negative for dysuria and hematuria.  Musculoskeletal:  Positive for arthralgias, back pain and neck pain. Negative for neck stiffness.  Skin:  Negative for rash.  Neurological:  Positive for dizziness and headaches. Negative for weakness.  Psychiatric/Behavioral:  Positive for agitation. Negative for behavioral problems. The patient is nervous/anxious.        Objective:    Physical Exam Vitals reviewed.  Constitutional:      General: She is not in acute distress.    Appearance: She is not diaphoretic.  HENT:     Head: Normocephalic and atraumatic.     Right Ear: No middle ear effusion. There is no impacted cerumen.     Left Ear: Tenderness present.  No middle ear effusion. There is no impacted cerumen.     Mouth/Throat:     Mouth: Mucous membranes are moist.  Eyes:     General: No scleral icterus.    Extraocular Movements: Extraocular movements intact.  Cardiovascular:     Rate and Rhythm: Normal rate and regular rhythm.     Pulses: Normal pulses.     Heart sounds: Normal heart sounds. No murmur heard. Pulmonary:     Breath sounds: Normal breath sounds. No wheezing or rales.  Musculoskeletal:     Cervical back: Neck supple. No tenderness.     Right lower leg: No edema.     Left  lower leg: No edema.  Skin:    General: Skin is warm.     Findings: No rash.     Comments: Hematoma about 3 cm in diameter over medial aspect of right leg Inclusion cyst over left shoulder  Neurological:     General: No focal deficit present.     Mental Status: She is alert and oriented to person, place, and time.     Cranial Nerves: No cranial nerve deficit.     Sensory: No sensory deficit.     Motor: No weakness.  Psychiatric:        Mood and Affect: Mood normal.        Behavior: Behavior normal.     BP 138/88 (BP Location: Left Arm, Patient Position: Sitting, Cuff Size: Normal)   Pulse (!) 55   Ht '5\' 6"'$  (1.676 m)   Wt 170 lb 1.3 oz (77.1 kg)   SpO2 97%   BMI 27.45 kg/m  Wt Readings from Last 3 Encounters:  08/15/22 170 lb 1.3 oz (77.1 kg)  07/20/22 169 lb 12.8 oz (77 kg)  07/13/22 169 lb (76.7 kg)        Assessment & Plan:   Problem List Items Addressed This Visit       Cardiovascular and Mediastinum   Hypertension    BP Readings from Last 1 Encounters:  08/15/22 138/88  Recently elevated due to stress at home and migraine Usually well-controlled with Atenolol and Cartia currently Counseled for compliance with the medications Advised DASH diet and moderate exercise/walking, at least 150 mins/week      Intractable migraine with aura without status migrainosus - Primary    Although it is worse due to  recent stress, her headache description is likely migraine Avoid triptans due to history of CAD arterial disease renal artery aneurysm Fioricet as needed for headache If frequent episodes of migraine, may need maintenance treatment - although she does not want to take it      Relevant Medications   butalbital-acetaminophen-caffeine (FIORICET) 50-325-40 MG tablet     Other   GAD (generalized anxiety disorder)       07/20/2022   11:16 AM 04/28/2022    8:20 AM  GAD 7 : Generalized Anxiety Score  Nervous, Anxious, on Edge 1 2  Control/stop worrying 3 3   Worry too much - different things 3 3  Trouble relaxing 3 3  Restless 3 2  Easily annoyed or irritable 2 2  Afraid - awful might happen 1 0  Total GAD 7 Score 16 15  Anxiety Difficulty Somewhat difficult   Had started BuSpar 5 mg BID, but has not been taking it regularly Relaxation material provided Advised to contact therapist/counselor at her workplace        Meds ordered this encounter  Medications   butalbital-acetaminophen-caffeine (FIORICET) 50-325-40 MG tablet    Sig: Take 1 tablet by mouth every 12 (twelve) hours as needed for headache or migraine.    Dispense:  20 tablet    Refill:  0     [facility] Keith Rake, MD

## 2022-08-15 NOTE — Assessment & Plan Note (Signed)
Although it is worse due to recent stress, her headache description is likely migraine Avoid triptans due to history of CAD arterial disease renal artery aneurysm Fioricet as needed for headache If frequent episodes of migraine, may need maintenance treatment - although she does not want to take it

## 2022-08-15 NOTE — Assessment & Plan Note (Signed)
    07/20/2022   11:16 AM 04/28/2022    8:20 AM  GAD 7 : Generalized Anxiety Score  Nervous, Anxious, on Edge 1 2  Control/stop worrying 3 3  Worry too much - different things 3 3  Trouble relaxing 3 3  Restless 3 2  Easily annoyed or irritable 2 2  Afraid - awful might happen 1 0  Total GAD 7 Score 16 15  Anxiety Difficulty Somewhat difficult    Had started BuSpar 5 mg BID, but has not been taking it regularly Relaxation material provided Advised to contact therapist/counselor at her workplace

## 2022-08-15 NOTE — Assessment & Plan Note (Signed)
BP Readings from Last 1 Encounters:  08/15/22 138/88   Recently elevated due to stress at home and migraine Usually well-controlled with Atenolol and Cartia currently Counseled for compliance with the medications Advised DASH diet and moderate exercise/walking, at least 150 mins/week

## 2022-08-22 ENCOUNTER — Other Ambulatory Visit: Payer: Self-pay | Admitting: Internal Medicine

## 2022-08-22 DIAGNOSIS — I6523 Occlusion and stenosis of bilateral carotid arteries: Secondary | ICD-10-CM

## 2022-08-30 ENCOUNTER — Ambulatory Visit: Payer: Medicare Other | Admitting: Internal Medicine

## 2022-08-31 ENCOUNTER — Ambulatory Visit (INDEPENDENT_AMBULATORY_CARE_PROVIDER_SITE_OTHER): Payer: Medicare Other | Admitting: Internal Medicine

## 2022-08-31 ENCOUNTER — Encounter: Payer: Self-pay | Admitting: Internal Medicine

## 2022-08-31 VITALS — BP 127/75 | HR 53 | Ht 66.0 in | Wt 171.4 lb

## 2022-08-31 DIAGNOSIS — R300 Dysuria: Secondary | ICD-10-CM | POA: Diagnosis not present

## 2022-08-31 DIAGNOSIS — R110 Nausea: Secondary | ICD-10-CM

## 2022-08-31 LAB — POCT URINALYSIS DIP (CLINITEK)
Bilirubin, UA: NEGATIVE
Blood, UA: NEGATIVE
Glucose, UA: NEGATIVE mg/dL
Ketones, POC UA: NEGATIVE mg/dL
Leukocytes, UA: NEGATIVE
Nitrite, UA: NEGATIVE
POC PROTEIN,UA: >=300 — AB
Spec Grav, UA: 1.025 (ref 1.010–1.025)
Urobilinogen, UA: 0.2 E.U./dL
pH, UA: 6 (ref 5.0–8.0)

## 2022-08-31 MED ORDER — ONDANSETRON HCL 4 MG PO TABS: 4.0000 mg | ORAL_TABLET | Freq: Three times a day (TID) | ORAL | 0 refills | Status: DC | PRN

## 2022-08-31 NOTE — Progress Notes (Signed)
Acute Office Visit  Subjective:     Patient ID: Jaclyn Brooks, female    DOB: 04-May-1961, 61 y.o.   MRN: [id number]  Chief Complaint  Patient presents with   Abdominal Pain    Possible UTI, felt this way before    Jaclyn Brooks presents for an acute visit today for evaluation of a 1 week history of abdominal discomfort and dysuria.  She endorses "knots" in her stomach and endorses nausea without vomiting.  She denies diarrhea/constipation as well.  She attributes her symptoms largely to family stress, but is also concerned for possible UTI as she states she has felt this way before when she has had a UTI.  She denies fever/chills, hematuria, increased urinary frequency, and has not noted a foul odor to her urine.  She is interested in a medication for nausea relief.  Review of Systems  Gastrointestinal:  Positive for abdominal pain and nausea. Negative for blood in stool, constipation, diarrhea, melena and vomiting.  Genitourinary:  Positive for dysuria. Negative for frequency and hematuria.  All other systems reviewed and are negative.       Objective:    BP 127/75   Pulse (!) 53   Ht '5\' 6"'$  (1.676 m)   Wt 171 lb 6.4 oz (77.7 kg)   SpO2 98%   BMI 27.66 kg/m    Physical Exam Vitals reviewed.  Constitutional:      General: She is not in acute distress.    Appearance: Normal appearance. She is not toxic-appearing.  HENT:     Head: Normocephalic and atraumatic.     Right Ear: External ear normal.     Left Ear: External ear normal.     Nose: Nose normal. No congestion or rhinorrhea.     Mouth/Throat:     Mouth: Mucous membranes are moist.     Pharynx: Oropharynx is clear. No oropharyngeal exudate or posterior oropharyngeal erythema.  Eyes:     General: No scleral icterus.    Extraocular Movements: Extraocular movements intact.     Conjunctiva/sclera: Conjunctivae normal.     Pupils: Pupils are equal, round, and reactive to light.  Cardiovascular:     Rate and Rhythm:  Normal rate and regular rhythm.     Pulses: Normal pulses.     Heart sounds: Normal heart sounds. No murmur heard.    No friction rub. No gallop.  Pulmonary:     Effort: Pulmonary effort is normal.     Breath sounds: Normal breath sounds. No wheezing, rhonchi or rales.  Abdominal:     General: Abdomen is flat. Bowel sounds are normal. There is no distension.     Palpations: Abdomen is soft.     Tenderness: There is no abdominal tenderness. There is no right CVA tenderness, left CVA tenderness, guarding or rebound.  Musculoskeletal:        General: No swelling. Normal range of motion.     Cervical back: Normal range of motion.     Right lower leg: No edema.     Left lower leg: No edema.  Lymphadenopathy:     Cervical: No cervical adenopathy.  Skin:    General: Skin is warm and dry.     Capillary Refill: Capillary refill takes less than 2 seconds.     Coloration: Skin is not jaundiced.  Neurological:     General: No focal deficit present.     Mental Status: She is alert and oriented to person, place, and time.  Psychiatric:  Mood and Affect: Mood normal.        Behavior: Behavior normal.       Assessment & Plan:   Problem List Items Addressed This Visit       Nausea    She endorses 1 week history of abdominal discomfort, which on further questioning she better characterizes as nausea.  She largely attributes this to family stress.  She is interested in a medication for nausea relief today.  She has not experienced any diarrhea, constipation, melena, or hematochezia. -Zofran prescribed for as needed nausea relief -UA ordered to assess for UTI      Dysuria - Primary    She endorses dysuria today that has been present x1 week.  There has been no increased urinary frequency, hematuria, or foul odor to her urine.  No CVA tenderness or flank pain is appreciated on exam. -UA ordered today       Meds ordered this encounter  Medications   ondansetron (ZOFRAN) 4 MG tablet     Sig: Take 1 tablet (4 mg total) by mouth every 8 (eight) hours as needed for nausea or vomiting.    Dispense:  20 tablet    Refill:  0    Return if symptoms worsen or fail to improve.  Johnette Abraham, MD

## 2022-08-31 NOTE — Patient Instructions (Signed)
It was a pleasure to see you today.  Thank you for giving [place] the opportunity to be involved in your care.  Below is a brief recap of your visit and next steps.  We will plan to see you again in January.  Summary I have prescribed Zofran for nausea relief today We will check your urine for infection You have follow up with Dr. Posey Pronto in January

## 2022-09-05 DIAGNOSIS — R300 Dysuria: Secondary | ICD-10-CM | POA: Insufficient documentation

## 2022-09-05 NOTE — Assessment & Plan Note (Signed)
She endorses dysuria today that has been present x1 week.  There has been no increased urinary frequency, hematuria, or foul odor to her urine.  No CVA tenderness or flank pain is appreciated on exam. -UA ordered today

## 2022-09-05 NOTE — Assessment & Plan Note (Signed)
She endorses 1 week history of abdominal discomfort, which on further questioning she better characterizes as nausea.  She largely attributes this to family stress.  She is interested in a medication for nausea relief today.  She has not experienced any diarrhea, constipation, melena, or hematochezia. -Zofran prescribed for as needed nausea relief -UA ordered to assess for UTI

## 2022-09-12 ENCOUNTER — Ambulatory Visit (INDEPENDENT_AMBULATORY_CARE_PROVIDER_SITE_OTHER): Payer: Medicare Other | Admitting: Family Medicine

## 2022-09-12 ENCOUNTER — Encounter: Payer: Self-pay | Admitting: Family Medicine

## 2022-09-12 ENCOUNTER — Other Ambulatory Visit: Payer: Self-pay | Admitting: Internal Medicine

## 2022-09-12 VITALS — BP 136/74 | HR 47 | Ht 66.0 in | Wt 169.1 lb

## 2022-09-12 DIAGNOSIS — K219 Gastro-esophageal reflux disease without esophagitis: Secondary | ICD-10-CM

## 2022-09-12 DIAGNOSIS — I10 Essential (primary) hypertension: Secondary | ICD-10-CM

## 2022-09-12 DIAGNOSIS — R1084 Generalized abdominal pain: Secondary | ICD-10-CM

## 2022-09-12 NOTE — Patient Instructions (Addendum)
I appreciate the opportunity to provide care to you today!    Follow up:  Dr. Posey Pronto                               GERD   Avoid certain foods and drinks, such as coffee, chocolate, onions, peppermint, spicy foods, carbonated beverages, citrus fruits, tomatoes, onions, Garlic, alcohol, Fatty foods (bacon, burgers, sausages, steak, fried foods, dairy food)  Recommended: High fiber foods, whole grain cereal, oatmeal, Jelinski rice, root vegetables, non- citrus fruits, High protein foods, Health fats (avocados, olive oil, nuts and seeds)   Please continue to a heart-healthy diet and increase your physical activities. Try to exercise for 25mns at least three times a week.      It was a pleasure to see you and I look forward to continuing to work together on your health and well-being. Please do not hesitate to call the office if you need care or have questions about your care.   Have a wonderful day and week. With Gratitude, GAlvira MondayMSN, FNP-BC

## 2022-09-12 NOTE — Assessment & Plan Note (Signed)
Abdominal pain likely due to GERD Physical examination negative for cystitis with low suspicion of cystitis Encourage patient to continue taking dexlansoprazole 60 milligrams daily  Encourage patient to avoid certain foods and drinks, such as coffee, chocolate, onions, peppermint, spicy foods, carbonated beverages, citrus fruits, tomatoes, onions, Garlic, alcohol, Fatty foods (bacon, burgers, sausages, steak, fried foods, dairy food) Recommend increasing her intake of high fiber foods, whole grain cereal, oatmeal, [place] rice, root vegetables, non- citrus fruits, High protein foods, Health fats (avocados, olive oil, nuts and seeds)

## 2022-09-12 NOTE — Progress Notes (Signed)
Established Patient Office Visit  Subjective:  Patient ID: Jaclyn Brooks, female    DOB: 18-Apr-1961  Age: 61 y.o. MRN: [id number]  CC:  Chief Complaint  Patient presents with   Abdominal Pain    Pt reports abdominal pain for 2 days now ( 09/10/2022) has been having frequent normal bowel movement states its also related to stress.     HPI Jaclyn Brooks is a 61 y.o. female with past medical history of hypertension,GERD, and COPD presents for f/u of  chronic medical conditions.  Abdominal pain: History of GERD.  She complains of epigastric abdominal pain.  Onset of symptoms 09/10/22.  Pain is intermittent and non-radiating.  She reports eating hot wings 2 days ago.  She takes dexlansoprazole 60 milligrams daily for GERD symptoms and reports minimal relief of her symptoms with the medication.  She reports having stomach ulcers and needs antibiotic treatment but declines  to have upper GI endoscopy to confirm the diagnosis of stomach ulcers.  She declined a colonoscopy.  She denies nausea, vomiting, fever, chills, and changes in her bowel or bladder.  She denies urgency, frequency, and dysuria.  She denies suprapubic pain and low back pain.   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[facility])     Past Surgical History:  Procedure Laterality Date   CHOLECYSTECTOMY N/A 01/28/2015   Procedure: LAPAROSCOPIC CHOLECYSTECTOMY;  Surgeon: Aviva Signs Md, MD;  Location: [facility];  Service: General;  Laterality: N/A;   excision of bone spurs Bilateral    Big toes   renal aneurysm Right    right coil-and only has function of left kidney   TOTAL ABDOMINAL HYSTERECTOMY     X-STOP IMPLANTATION      Family History  Problem Relation Age of Onset   Hypertension Mother    Heart disease Mother        Cardiac Arrest     Hypertension Father    Cancer Father    Hypertension Sister    Cancer Brother        Bladder Cancer    Hypertension Brother    Hypertension Sister     Social History   Socioeconomic History   Marital status: Unknown    Spouse name: Not on file   Number of children: 1   Years of education: 12   Highest education level: Some college, no degree  Occupational History   Occupation: Retired  Tobacco Use   Smoking status: Former    Packs/day: 0.25    Years: 20.00    Total pack years: 5.00    Types: Cigarettes    Quit date: 2019    Years since quitting: 4.9   Smokeless tobacco: Never  Vaping Use   Vaping Use: Never used  Substance and Sexual Activity   Alcohol use: Not Currently    Comment: occasionally   Drug use: No   Sexual activity: Yes    Birth control/protection: Surgical  Other Topics Concern   Not on file  Social History Narrative   Not on file   Social Determinants of Health   Financial Resource Strain: Low Risk  (10/19/2021)   Overall Financial Resource Strain (CARDIA)    Difficulty of Paying Living Expenses: Not hard at all  Food  Insecurity: No Food Insecurity (10/19/2021)   Hunger Vital Sign    Worried About Running Out of Food in the Last Year: Never true    Ran Out of Food in the Last Year: Never true  Transportation Needs: No Transportation Needs (10/19/2021)   PRAPARE - Hydrologist (Medical): No    Lack of Transportation (Non-Medical): No  Physical Activity: Sufficiently Active (10/19/2021)   Exercise Vital Sign    Days of Exercise per Week: 5 days    Minutes of Exercise per Session: 30 min  Stress: No Stress Concern Present (10/19/2021)   [place]    Feeling of Stress : Not at all  Social Connections: Moderately Isolated (10/19/2021)   Social Connection and Isolation Panel [NHANES]    Frequency of Communication with Friends and Family: More than three  times a week    Frequency of Social Gatherings with Friends and Family: More than three times a week    Attends Religious Services: More than 4 times per year    Active Member of [employer] or Organizations: No    Attends Archivist Meetings: Never    Marital Status: Never married  Intimate Partner Violence: Not At Risk (10/19/2021)   Humiliation, Afraid, Rape, and Kick questionnaire    Fear of Current or Ex-Partner: No    Emotionally Abused: No    Physically Abused: No    Sexually Abused: No    Outpatient Medications Prior to Visit  Medication Sig Dispense Refill   albuterol (VENTOLIN HFA) 108 (90 Base) MCG/ACT inhaler Inhale 1-2 puffs into the lungs every 6 (six) hours as needed for wheezing or shortness of breath. 1 each 2   allopurinol (ZYLOPRIM) 100 MG tablet Take 2 tablets (200 mg total) by mouth daily. 60 tablet 5   atenolol (TENORMIN) 50 MG tablet Take 1 tablet by mouth once daily 90 tablet 0   budesonide-formoterol (SYMBICORT) 80-4.5 MCG/ACT inhaler Inhale 2 puffs into the lungs 2 (two) times daily. 1 each 2   busPIRone (BUSPAR) 5 MG tablet Take 1 tablet (5 mg total) by mouth 2 (two) times daily. 60 tablet 3   butalbital-acetaminophen-caffeine (FIORICET) 50-325-40 MG tablet Take 1 tablet by mouth every 12 (twelve) hours as needed for headache or migraine. 20 tablet 0   CARTIA XT 240 MG 24 hr capsule Take 1 capsule by mouth once daily 90 capsule 0   clopidogrel (PLAVIX) 75 MG tablet Take 1 tablet by mouth in the evening 90 tablet 0   dexlansoprazole (DEXILANT) 60 MG capsule TAKE 1  BY MOUTH ONCE DAILY 15 capsule 0   hydrochlorothiazide (HYDRODIURIL) 12.5 MG tablet Take 12.5 mg by mouth daily.     hydrocortisone (ANUSOL-HC) 2.5 % rectal cream Place 1 application rectally 2 (two) times daily. 30 g 0   methocarbamol (ROBAXIN) 500 MG tablet Take 1 tablet (500 mg total) by mouth 2 (two) times daily. 30 tablet 1   ondansetron (ZOFRAN) 4 MG tablet Take 1 tablet (4 mg total) by mouth  every 8 (eight) hours as needed for nausea or vomiting. 20 tablet 0   oxyCODONE-acetaminophen (PERCOCET/ROXICET) 5-325 MG tablet Take 1 tablet by mouth every 6 (six) hours as needed for severe pain. 6 tablet 0   sodium chloride (OCEAN) 0.65 % SOLN nasal spray Place 1 spray into both nostrils as needed for congestion. 15 mL 0   triamcinolone cream (KENALOG) 0.1 % Apply 1 Application topically 2 (two)  times daily. 30 g 0   No facility-administered medications prior to visit.    Allergies  Allergen Reactions   Bee Venom Hives   Nsaids     Kidney disease    Shellfish Allergy Itching    ROS Review of Systems  Constitutional:  Negative for chills, fatigue and fever.  Eyes:  Negative for visual disturbance.  Respiratory:  Negative for cough and chest tightness.   Gastrointestinal:  Positive for abdominal pain. Negative for abdominal distention, constipation, diarrhea, nausea, rectal pain and vomiting.  Genitourinary:  Negative for dysuria, frequency and urgency.  Neurological:  Negative for dizziness and headaches.      Objective:    Physical Exam HENT:     Head: Normocephalic.     Mouth/Throat:     Mouth: Mucous membranes are moist.  Eyes:     Extraocular Movements: Extraocular movements intact.     Pupils: Pupils are equal, round, and reactive to light.  Cardiovascular:     Rate and Rhythm: Normal rate and regular rhythm.     Heart sounds: No murmur heard. Pulmonary:     Effort: Pulmonary effort is normal.     Breath sounds: Normal breath sounds.  Abdominal:     General: Abdomen is flat.     Palpations: Abdomen is soft.     Tenderness: There is abdominal tenderness in the epigastric area. There is no right CVA tenderness, left CVA tenderness, guarding or rebound. Negative signs include Rovsing's sign.  Neurological:     Mental Status: She is alert.     BP 136/74 (BP Location: Left Arm)   Pulse (!) 47   Ht '5\' 6"'$  (1.676 m)   Wt 169 lb 1.9 oz (76.7 kg)   SpO2 98%    BMI 27.30 kg/m  Wt Readings from Last 3 Encounters:  09/12/22 169 lb 1.9 oz (76.7 kg)  08/31/22 171 lb 6.4 oz (77.7 kg)  08/15/22 170 lb 1.3 oz (77.1 kg)    Lab Results  Component Value Date   TSH 2.43 06/19/2020   Lab Results  Component Value Date   WBC 8.4 01/15/2021   HGB 13.3 01/15/2021   HCT 39.3 01/15/2021   MCV 96.1 01/15/2021   PLT 313 01/15/2021   Lab Results  Component Value Date   NA 137 03/04/2021   K CANCELED 03/04/2021   CO2 24 03/04/2021   GLUCOSE 132 (H) 03/04/2021   BUN 32 (H) 03/04/2021   CREATININE 1.34 (H) 03/04/2021   BILITOT CANCELED 03/04/2021   ALKPHOS 60 01/26/2015   AST CANCELED 03/04/2021   ALT 30 (H) 03/04/2021   PROT 8.1 03/04/2021   ALBUMIN 4.0 01/26/2015   CALCIUM 10.5 (H) 03/04/2021   ANIONGAP 10 01/26/2015   Lab Results  Component Value Date   CHOL 157 06/19/2020   Lab Results  Component Value Date   HDL 24 (L) 06/19/2020   Lab Results  Component Value Date   LDLCALC 92 06/19/2020   Lab Results  Component Value Date   TRIG 287 (H) 06/19/2020   Lab Results  Component Value Date   CHOLHDL 6.5 (H) 06/19/2020   Lab Results  Component Value Date   HGBA1C 5.7 07/13/2022   HGBA1C 5.7 07/13/2022   HGBA1C 5.7 07/13/2022      Assessment & Plan:  Generalized abdominal pain -     POCT URINALYSIS DIP (CLINITEK)  Gastroesophageal reflux disease without esophagitis Assessment & Plan: Abdominal pain likely due to GERD Physical examination negative for cystitis with  low suspicion of cystitis Encourage patient to continue taking dexlansoprazole 60 milligrams daily  Encourage patient to avoid certain foods and drinks, such as coffee, chocolate, onions, peppermint, spicy foods, carbonated beverages, citrus fruits, tomatoes, onions, Garlic, alcohol, Fatty foods (bacon, burgers, sausages, steak, fried foods, dairy food) Recommend increasing her intake of high fiber foods, whole grain cereal, oatmeal, [place] rice, root vegetables,  non- citrus fruits, High protein foods, Health fats (avocados, olive oil, nuts and seeds)     Follow-up: Return if symptoms worsen or fail to improve.   Alvira Monday, FNP

## 2022-09-13 ENCOUNTER — Telehealth: Payer: Self-pay | Admitting: Internal Medicine

## 2022-09-13 NOTE — Telephone Encounter (Signed)
Kindly revise the work note written excusing the patient from work on 09/12/2022 and 09/13/2022

## 2022-09-13 NOTE — Telephone Encounter (Signed)
Pt called stating her work note was only wrote to be out of work yesterday. She states it was supposed to be for yesterday 12/4 & today 12/5. Can you please redo the letter and call pt when ready?

## 2022-09-14 ENCOUNTER — Ambulatory Visit: Payer: Medicare Other | Admitting: Surgical

## 2022-09-16 NOTE — Telephone Encounter (Signed)
Message addressed.

## 2022-09-26 ENCOUNTER — Ambulatory Visit: Payer: Medicare Other | Admitting: Surgical

## 2022-09-26 DIAGNOSIS — M79645 Pain in left finger(s): Secondary | ICD-10-CM | POA: Diagnosis not present

## 2022-10-03 ENCOUNTER — Other Ambulatory Visit: Payer: Self-pay | Admitting: Internal Medicine

## 2022-10-06 ENCOUNTER — Encounter: Payer: Self-pay | Admitting: Internal Medicine

## 2022-10-06 ENCOUNTER — Telehealth (INDEPENDENT_AMBULATORY_CARE_PROVIDER_SITE_OTHER): Payer: Medicare Other | Admitting: Internal Medicine

## 2022-10-06 DIAGNOSIS — G43009 Migraine without aura, not intractable, without status migrainosus: Secondary | ICD-10-CM

## 2022-10-06 NOTE — Progress Notes (Signed)
   Acute Virtual Visit  Virtual Visit via Video Note  I connected with [name] on 10/06/22 at  3:20 PM EST by a video enabled telemedicine application and verified that I am speaking with the correct person using two identifiers.  Location: Patient: [address] [place], [place] [address] Provider: [phone] [address], [place], [place] [address]   I discussed the limitations of evaluation and management by telemedicine and the availability of in person appointments. The patient expressed understanding and agreed to proceed.  History of Present Illness:  Ms. Wilmeth has been evaluated today for migraine headache.  She reports onset of headache overnight.  Headache is described as her usual migraine, beginning in the occipital region of her scalp and radiating to the front.  She reports intensity reaching 9 out of 10 and being associated with photophobia as well as nausea.  She took Fioricet earlier today, which has currently improved her headache.  She rates her current pain as 4 out of 10.  Photophobia and nausea have both resolved.  Prior to today, her last migraine was on 08/15/22.  Assessment and Plan:  Migraine Headache Acute telehealth encounter arranged today for evaluation of migraine headache.  She reports intensity reaching 9/10 earlier today with associated photophobia and nausea.  She took Fioricet for headache relief, which has been effective.  She currently rates her headache as 4 out of 10 and states that her photophobia and nausea have resolved. -No additional treatment indicated given symptomatic improvement -Follow-up as needed  Follow Up Instructions:  I discussed the assessment and treatment plan with the patient. The patient was provided an opportunity to ask questions and all were answered. The patient agreed with the plan and demonstrated an understanding of the instructions.   The patient was advised to call back or seek an in-person evaluation if the symptoms worsen or if  the condition fails to improve as anticipated.  I provided 6 minutes of non-face-to-face time during this encounter.   Johnette Abraham, MD

## 2022-10-07 ENCOUNTER — Ambulatory Visit: Payer: Medicare Other | Admitting: Family Medicine

## 2022-10-07 ENCOUNTER — Ambulatory Visit: Payer: Medicare Other | Admitting: Surgical

## 2022-10-12 ENCOUNTER — Ambulatory Visit: Payer: Medicare Other | Admitting: Surgical

## 2022-10-12 DIAGNOSIS — G43909 Migraine, unspecified, not intractable, without status migrainosus: Secondary | ICD-10-CM | POA: Insufficient documentation

## 2022-10-20 ENCOUNTER — Ambulatory Visit: Payer: Medicare Other | Admitting: Internal Medicine

## 2022-10-28 ENCOUNTER — Ambulatory Visit: Payer: Medicare Other | Admitting: Surgical

## 2022-11-04 ENCOUNTER — Ambulatory Visit (INDEPENDENT_AMBULATORY_CARE_PROVIDER_SITE_OTHER): Payer: Medicare Other | Admitting: Surgical

## 2022-11-04 ENCOUNTER — Telehealth: Payer: Self-pay

## 2022-11-04 DIAGNOSIS — M1711 Unilateral primary osteoarthritis, right knee: Secondary | ICD-10-CM

## 2022-11-04 NOTE — Telephone Encounter (Signed)
Can we get patient approved for gel inj for R knee.  She would like it to be done late May or Early June

## 2022-11-04 NOTE — Telephone Encounter (Signed)
Will submit at the end of Doreena Maulden, early May for gel injection.

## 2022-11-06 ENCOUNTER — Encounter: Payer: Self-pay | Admitting: Surgical

## 2022-11-06 MED ORDER — LIDOCAINE HCL 1 % IJ SOLN
5.0000 mL | INTRAMUSCULAR | Status: AC | PRN
  Administered 2022-11-04: 5 mL

## 2022-11-06 MED ORDER — METHYLPREDNISOLONE ACETATE 40 MG/ML IJ SUSP
40.0000 mg | INTRAMUSCULAR | Status: AC | PRN
  Administered 2022-11-04: 40 mg via INTRA_ARTICULAR

## 2022-11-06 MED ORDER — BUPIVACAINE HCL 0.25 % IJ SOLN
4.0000 mL | INTRAMUSCULAR | Status: AC | PRN
  Administered 2022-11-04: 4 mL via INTRA_ARTICULAR

## 2022-11-06 NOTE — Progress Notes (Signed)
Office Visit Note   Patient: Jaclyn Brooks           Date of Birth: 1961/08/20           MRN: [id number] Visit Date: 11/04/2022 Requested by: Lindell Spar, MD [address] [place],  [place] [address] PCP: Lindell Spar, MD  Subjective: Chief Complaint  Patient presents with   Right Knee - Pain    HPI: Jaclyn Brooks is a 62 y.o. female who presents to the office reporting right knee pain.  Patient has a history of right knee osteoarthritis as well as intermittent gout flares.  She states that she has had overall good improvement of her knee pain with no recent gout flares since her last visit in July 2023.  She feels her new job which involves a lot of walking has been good for her knee but recently pain has flared up just a little bit but not to the point where she is having difficulty weightbearing or getting around such as with her gout flares.  Denies any fevers or chills.  No new injury..                ROS: All systems reviewed are negative as they relate to the chief complaint within the history of present illness.  Patient denies fevers or chills.  Assessment & Plan: Visit Diagnoses:  1. Arthritis of right knee     Plan: Patient is a 62 year old female who presents for evaluation of right knee pain.  Overall her knee pain has been very well-controlled since her last injection in July 2023 and she has had no acute gout flares recently.  Plan today is administer cortisone injection which she tolerated well.  We will preapproved her for right knee gel injection to be done in late April or early May.  This is done well for her in the past by her history.  Her history.  Follow-up after gel injection approval in the springtime.  This patient is diagnosed with osteoarthritis of the knee(s).    Radiographs show evidence of joint space narrowing, osteophytes, subchondral sclerosis and/or subchondral cysts.  This patient has knee pain which interferes with functional and activities  of daily living.    This patient has experienced inadequate response, adverse effects and/or intolerance with conservative treatments such as acetaminophen, NSAIDS, topical creams, physical therapy or regular exercise, knee bracing and/or weight loss.   This patient has experienced inadequate response or has a contraindication to intra articular steroid injections for at least 3 months.   This patient is not scheduled to have a total knee replacement within 6 months of starting treatment with viscosupplementation.   Follow-Up Instructions: Return in about 3 months (around 02/03/2023).   Orders:  No orders of the defined types were placed in this encounter.  No orders of the defined types were placed in this encounter.     Procedures: Large Joint Inj: R knee on 11/04/2022 2:27 PM Indications: diagnostic evaluation, joint swelling and pain Details: 18 G 1.5 in needle, superolateral approach  Arthrogram: No  Medications: 5 mL lidocaine 1 %; 40 mg methylPREDNISolone acetate 40 MG/ML; 4 mL bupivacaine 0.25 % Outcome: tolerated well, no immediate complications Procedure, treatment alternatives, risks and benefits explained, specific risks discussed. Consent was given by the patient. Immediately prior to procedure a time out was called to verify the correct patient, procedure, equipment, support staff and site/side marked as required. Patient was prepped and draped in the usual  sterile fashion.       Clinical Data: No additional findings.  Objective: Vital Signs: There were no vitals taken for this visit.  Physical Exam:  Constitutional: Patient appears well-developed HEENT:  Head: Normocephalic Eyes:EOM are normal Neck: Normal range of motion Cardiovascular: Normal rate Pulmonary/chest: Effort normal Neurologic: Patient is alert Skin: Skin is warm Psychiatric: Patient has normal mood and affect  Ortho Exam: Ortho exam demonstrates right knee with trace effusion.  No  cellulitis or skin changes noted.  She has been mild to moderate tenderness over the medial and lateral joint lines.  She is able to perform straight leg raise without extensor lag.  She has range of motion from about 0 degrees extension to 115 degrees of knee flexion.  No calf tenderness.  Negative Homans' sign.  No pain with hip range of motion.  Specialty Comments:  No specialty comments available.  Imaging: No results found.   PMFS History: Patient Active Problem List   Diagnosis Date Noted   Migraine headache 10/12/2022   Dysuria 09/05/2022   Intractable migraine with aura without status migrainosus 08/15/2022   Acute otitis externa of right ear 07/13/2022   Traumatic hematoma of right lower leg 07/13/2022   History of total abdominal hysterectomy 07/04/2022   GAD (generalized anxiety disorder) 04/28/2022   Caregiver stress 04/28/2022   Chronic left-sided low back pain without sciatica 04/13/2022   Contact dermatitis 04/13/2022   Primary osteoarthritis of right knee 03/09/2022   Refused influenza vaccine 12/02/2021   Encounter for general adult medical examination with abnormal findings 11/30/2021   Epidermal cyst 11/30/2021   Allergic rhinitis 08/29/2021   Nausea 08/27/2021   Hemorrhoids 07/07/2021   Gout 03/25/2021   Diabetes mellitus without complication ([place]) 24/26/8341   Primary osteoarthritis of right foot 05/12/2020   GERD (gastroesophageal reflux disease) 05/31/2019   Solitary kidney, acquired 05/10/2019   Carotid arterial disease ([place]) 12/11/2018   COPD, 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   CKD (chronic kidney disease) stage 3, GFR 30-59 ml/min (HCC) 10/01/2018   Aortic valve sclerosis 10/01/2018   Hypertension 09/11/2018   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[place])     Family History   Problem Relation Age of Onset   Hypertension Mother    Heart disease Mother        Cardiac Arrest    Hypertension Father    Cancer Father    Hypertension Sister    Cancer Brother        Bladder Cancer    Hypertension Brother    Hypertension Sister     Past Surgical History:  Procedure Laterality Date   CHOLECYSTECTOMY N/A 01/28/2015   Procedure: LAPAROSCOPIC CHOLECYSTECTOMY;  Surgeon: Aviva Signs Md, MD;  Location: [facility];  Service: General;  Laterality: N/A;   excision of bone spurs Bilateral    Big toes   renal aneurysm Right    right coil-and only has function of left kidney   TOTAL ABDOMINAL HYSTERECTOMY     X-STOP IMPLANTATION     Social History   Occupational History   Occupation: Retired  Tobacco Use   Smoking status: Former    Packs/day: 0.25    Years: 20.00    Total pack years: 5.00    Types: Cigarettes  Quit date: 2019    Years since quitting: 5.0   Smokeless tobacco: Never  Vaping Use   Vaping Use: Never used  Substance and Sexual Activity   Alcohol use: Not Currently    Comment: occasionally   Drug use: No   Sexual activity: Yes    Birth control/protection: Surgical

## 2022-11-11 ENCOUNTER — Other Ambulatory Visit: Payer: Self-pay

## 2022-11-11 ENCOUNTER — Encounter: Payer: Self-pay | Admitting: Emergency Medicine

## 2022-11-11 ENCOUNTER — Ambulatory Visit
Admission: EM | Admit: 2022-11-11 | Discharge: 2022-11-11 | Disposition: A | Discharge status: Home / Self Care | Payer: Medicare Other | Attending: Nurse Practitioner | Admitting: Nurse Practitioner

## 2022-11-11 DIAGNOSIS — Z1152 Encounter for screening for COVID-19: Secondary | ICD-10-CM | POA: Insufficient documentation

## 2022-11-11 DIAGNOSIS — B349 Viral infection, unspecified: Secondary | ICD-10-CM | POA: Insufficient documentation

## 2022-11-11 MED ORDER — PROMETHAZINE-DM 6.25-15 MG/5ML PO SYRP: 5.0000 mL | ORAL_SOLUTION | Freq: Four times a day (QID) | ORAL | 0 refills | Status: DC | PRN

## 2022-11-11 NOTE — ED Provider Notes (Signed)
[facility]    CSN: [id number] Arrival date & time: 11/11/22  8099      History   Chief Complaint Chief Complaint  Patient presents with   Headache    HPI Jaclyn Brooks is a 62 y.o. female.   The history is provided by the patient.   Patient presents for complaints of headache, scratchy throat, fatigue, nausea, diarrhea, and right-sided neck pain.  Patient states symptoms started over the past 24 hours.  Patient reports there is an outbreak of COVID at her workplace.  She denies fever, ear pain, wheezing, abdominal pain, vomiting, or constipation.  Patient reports that she does have a history of reflux disease, asthma, and has 1 kidney.  She reports that she has been using her albuterol inhaler more than usual.  She states that she has also been vaccinated for COVID.  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[facility])     Patient Active Problem List   Diagnosis Date Noted   Migraine headache 10/12/2022   Dysuria 09/05/2022   Intractable migraine with aura without status migrainosus 08/15/2022   Acute otitis externa of right ear 07/13/2022   Traumatic hematoma of right lower leg 07/13/2022   History of total abdominal hysterectomy 07/04/2022   GAD (generalized anxiety disorder) 04/28/2022   Caregiver stress 04/28/2022   Chronic left-sided low back pain without sciatica 04/13/2022   Contact dermatitis 04/13/2022   Primary osteoarthritis of right knee 03/09/2022   Refused influenza vaccine 12/02/2021   Encounter for general adult medical examination with abnormal findings 11/30/2021   Epidermal cyst 11/30/2021   Allergic rhinitis 08/29/2021   Nausea 08/27/2021   Hemorrhoids 07/07/2021   Gout 03/25/2021   Diabetes mellitus without complication ([place]) 83/38/2505   Primary osteoarthritis of  right foot 05/12/2020   GERD (gastroesophageal reflux disease) 05/31/2019   Solitary kidney, acquired 05/10/2019   Carotid arterial disease ([place]) 12/11/2018   COPD, mild ([place]) 10/01/2018   CKD (chronic kidney disease) stage 3, GFR 30-59 ml/min (HCC) 10/01/2018   Aortic valve sclerosis 10/01/2018   Hypertension 09/11/2018    Past Surgical History:  Procedure Laterality Date   CHOLECYSTECTOMY N/A 01/28/2015   Procedure: LAPAROSCOPIC CHOLECYSTECTOMY;  Surgeon: Aviva Signs Md, MD;  Location: [facility];  Service: General;  Laterality: N/A;   excision of bone spurs Bilateral    Big toes   renal aneurysm Right    right coil-and only has function of left kidney   TOTAL ABDOMINAL HYSTERECTOMY     X-STOP IMPLANTATION      OB History     Gravida  1   Para  1   Term      Preterm  1   AB      Living         SAB      IAB      Ectopic      Multiple      Live Births               Home Medications    Prior to Admission medications   Medication Sig Start Date End Date Taking? Authorizing Provider  promethazine-dextromethorphan (PROMETHAZINE-DM) 6.25-15 MG/5ML syrup Take 5 mLs by mouth 4 (four) times daily as needed for cough. 11/11/22  Yes Khush Pasion-Warren, Alda Lea, NP  albuterol (VENTOLIN HFA) 108 (90 Base) MCG/ACT inhaler Inhale 1-2 puffs into the lungs every 6 (six) hours as needed for wheezing or shortness of breath. 11/26/20   Lindell Spar, MD  allopurinol (ZYLOPRIM) 100 MG tablet Take 2 tablets (200 mg total) by mouth daily. 12/07/21   Lindell Spar, MD  atenolol (TENORMIN) 50 MG tablet Take 1 tablet by mouth once daily 08/15/22   Lindell Spar, MD  budesonide-formoterol [facility]) 80-4.5 MCG/ACT inhaler Inhale 2 puffs into the lungs 2 (two) times daily. 10/21/21   Lindell Spar, MD  busPIRone (BUSPAR) 5 MG tablet Take 1 tablet (5 mg total) by mouth 2 (two) times daily. 07/20/22   Lindell Spar, MD  butalbital-acetaminophen-caffeine (FIORICET) [phone] MG  tablet Take 1 tablet by mouth every 12 (twelve) hours as needed for headache or migraine. 08/15/22   Lindell Spar, MD  CARTIA XT 240 MG 24 hr capsule Take 1 capsule by mouth once daily 09/12/22   Lindell Spar, MD  clopidogrel (PLAVIX) 75 MG tablet Take 1 tablet by mouth in the evening 08/22/22   Lindell Spar, MD  dexlansoprazole (DEXILANT) 60 MG capsule Take 1 capsule by mouth once daily 10/04/22   Lindell Spar, MD  hydrochlorothiazide (HYDRODIURIL) 12.5 MG tablet Take 12.5 mg by mouth daily. 03/21/22   [provider]  hydrocortisone (ANUSOL-HC) 2.5 % rectal cream Place 1 application rectally 2 (two) times daily. 07/07/21   Lindell Spar, MD  methocarbamol (ROBAXIN) 500 MG tablet Take 1 tablet (500 mg total) by mouth 2 (two) times daily. 04/13/22   Lindell Spar, MD  ondansetron (ZOFRAN) 4 MG tablet Take 1 tablet (4 mg total) by mouth every 8 (eight) hours as needed for nausea or vomiting. 08/31/22   Johnette Abraham, MD  oxyCODONE-acetaminophen (PERCOCET/ROXICET) 5-325 MG tablet Take 1 tablet by mouth every 6 (six) hours as needed for severe pain. 05/20/22   Davonna Belling, MD  sodium chloride (OCEAN) 0.65 % SOLN nasal spray Place 1 spray into both nostrils as needed for congestion. 11/30/21   Lindell Spar, MD  triamcinolone cream (KENALOG) 0.1 % Apply 1 Application topically 2 (two) times daily. 04/13/22   Lindell Spar, MD  atorvastatin (LIPITOR) 20 MG tablet Take 1 tablet (20 mg total) by mouth at bedtime. 11/25/19 02/18/20  Alycia Rossetti, MD    Family History Family History  Problem Relation Age of Onset   Hypertension Mother    Heart disease Mother        Cardiac Arrest    Hypertension Father    Cancer Father    Hypertension Sister    Cancer Brother        Bladder Cancer    Hypertension Brother    Hypertension Sister     Social History Social History   Tobacco Use   Smoking status: Former    Packs/day: 0.25    Years: 20.00    Total pack years: 5.00     Types: Cigarettes    Quit date: 2019    Years since quitting: 5.0   Smokeless tobacco: Never  Vaping Use   Vaping Use: Never used  Substance Use Topics   Alcohol use: Not Currently    Comment: occasionally   Drug use: No     Allergies   Bee venom, Nsaids, and Shellfish allergy   Review of Systems Review of Systems Per HPI  Physical Exam Triage Vital Signs  ED Triage Vitals  Enc Vitals Group     BP 11/11/22 0857 (!) 147/82     Pulse Rate 11/11/22 0857 (!) 53     Resp 11/11/22 0857 20     Temp 11/11/22 0857 98.4 F (36.9 C)     Temp Source 11/11/22 0857 Oral     SpO2 11/11/22 0857 98 %     Weight --      Height --      Head Circumference --      Peak Flow --      Pain Score 11/11/22 0854 8     Pain Loc --      Pain Edu? --      Excl. in [place]? --    No data found.  Updated Vital Signs BP (!) 147/82 (BP Location: Right Arm)   Pulse (!) 53 Comment: reports baseline. NAD noted.  Temp 98.4 F (36.9 C) (Oral)   Resp 20   SpO2 98%   Visual Acuity Right Eye Distance:   Left Eye Distance:   Bilateral Distance:    Right Eye Near:   Left Eye Near:    Bilateral Near:     Physical Exam Vitals and nursing note reviewed.  Constitutional:      General: She is not in acute distress.    Appearance: She is well-developed.  HENT:     Head: Normocephalic and atraumatic.     Right Ear: Tympanic membrane, ear canal and external ear normal.     Left Ear: Tympanic membrane, ear canal and external ear normal.     Nose: Rhinorrhea present.     Mouth/Throat:     Mouth: Mucous membranes are moist.     Pharynx: Posterior oropharyngeal erythema present. No oropharyngeal exudate.     Comments: Cobblestoning present on posterior oropharynx Eyes:     Extraocular Movements: Extraocular movements intact.     Conjunctiva/sclera: Conjunctivae normal.     Pupils: Pupils are equal, round, and reactive to light.  Cardiovascular:     Rate and Rhythm: Normal rate and regular  rhythm.     Heart sounds: No murmur heard. Pulmonary:     Effort: Pulmonary effort is normal. No respiratory distress.     Breath sounds: Normal breath sounds.  Abdominal:     Palpations: Abdomen is soft.     Tenderness: There is no abdominal tenderness.  Musculoskeletal:        General: No swelling.     Cervical back: Normal range of motion.  Lymphadenopathy:     Cervical: No cervical adenopathy.  Skin:    General: Skin is warm and dry.     Capillary Refill: Capillary refill takes less than 2 seconds.  Neurological:     General: No focal deficit present.     Mental Status: She is alert and oriented to person, place, and time.  Psychiatric:        Mood and Affect: Mood normal.        Behavior: Behavior normal.      UC Treatments / Results  Labs (all labs ordered are listed, but only abnormal results are displayed) Labs Reviewed  SARS CORONAVIRUS 2 (TAT 6-24 HRS)    EKG   Radiology No results found.  Procedures Procedures (including critical care time)  Medications Ordered in UC Medications - No data to display  Initial Impression / Assessment and Plan / UC Course  I have reviewed the triage vital signs and the nursing notes.  Pertinent labs &  imaging results that were available during my care of the patient were reviewed by me and considered in my medical decision making (see chart for details).  The patient is well-appearing, she is in no acute distress, she is mildly hypertensive and bradycardic, but vitals are mostly stable.  Suspect a viral illness at this time.  Given the patient's symptoms to include headache, scratchy throat, and diarrhea, COVID test is pending.  Patient is a candidate to receive molnupiravir if her COVID test is positive.  For patient's cough and nausea, Promethazine DM was prescribed.  Supportive care recommendations were also provided to the patient to include use of Tylenol, warm salt water gargles, and a diet that increases the bulk in  her stool.  Discussed viral etiology with the patient and when follow-up may be indicated.  Patient advised to follow-up in this clinic or with her primary care physician if symptoms fail to improve.  Patient verbalizes understanding.  All questions were answered.  Patient stable for discharge.  Work note was provided.   Final Clinical Impressions(s) / UC Diagnoses   Final diagnoses:  Encounter for screening for COVID-19  Viral illness     Discharge Instructions      COVID test is pending.  If your COVID test result is positive, you are a candidate to receive molnupiravir as antiviral therapy. Take medication as prescribed. Increase fluids and allow for plenty of rest. Take over-the-counter Tylenol as needed for pain or discomfort. Warm salt water gargles 3-4 times daily while throat symptoms persist. For your diarrhea, recommend a diet that adds bulk to your stool.  This includes rice, bananas, applesauce, and toast.  If symptoms do not improve, recommend over-the-counter Imodium A-D to help with the diarrhea.  Take medication as prescribed. Continue use of your albuterol inhaler as needed for shortness of breath. As discussed, a viral illness may last anywhere from 7 to 14 days.  If your symptoms are suddenly worsening without improvement, please follow-up in this clinic or with your primary care physician for further evaluation. Follow-up as needed.     ED Prescriptions     Medication Sig Dispense Auth. Provider   promethazine-dextromethorphan (PROMETHAZINE-DM) 6.25-15 MG/5ML syrup Take 5 mLs by mouth 4 (four) times daily as needed for cough. 118 mL Qianna Clagett-Warren, Alda Lea, NP      PDMP not reviewed this encounter.   Tish Men, NP 11/11/22 [phone]

## 2022-11-11 NOTE — ED Triage Notes (Addendum)
Pt reports headache, nausea, right sided neck pain,diarrhea since last night. Reports several co-workers have similar symptoms.

## 2022-11-11 NOTE — Discharge Instructions (Addendum)
COVID test is pending.  If your COVID test result is positive, you are a candidate to receive molnupiravir as antiviral therapy. Take medication as prescribed. Increase fluids and allow for plenty of rest. Take over-the-counter Tylenol as needed for pain or discomfort. Warm salt water gargles 3-4 times daily while throat symptoms persist. For your diarrhea, recommend a diet that adds bulk to your stool.  This includes rice, bananas, applesauce, and toast.  If symptoms do not improve, recommend over-the-counter Imodium A-D to help with the diarrhea.  Take medication as prescribed. Continue use of your albuterol inhaler as needed for shortness of breath. As discussed, a viral illness may last anywhere from 7 to 14 days.  If your symptoms are suddenly worsening without improvement, please follow-up in this clinic or with your primary care physician for further evaluation. Follow-up as needed.

## 2022-11-12 LAB — SARS CORONAVIRUS 2 (TAT 6-24 HRS): SARS Coronavirus 2: NEGATIVE

## 2022-11-15 ENCOUNTER — Ambulatory Visit: Payer: Medicare Other | Admitting: Internal Medicine

## 2022-11-20 ENCOUNTER — Other Ambulatory Visit: Payer: Self-pay | Admitting: Internal Medicine

## 2022-11-20 DIAGNOSIS — I10 Essential (primary) hypertension: Secondary | ICD-10-CM

## 2022-11-21 ENCOUNTER — Ambulatory Visit: Payer: Medicare Other | Admitting: Internal Medicine

## 2022-11-24 ENCOUNTER — Ambulatory Visit (INDEPENDENT_AMBULATORY_CARE_PROVIDER_SITE_OTHER): Payer: Medicare Other | Admitting: Internal Medicine

## 2022-11-24 ENCOUNTER — Encounter: Payer: Self-pay | Admitting: Internal Medicine

## 2022-11-24 ENCOUNTER — Other Ambulatory Visit (HOSPITAL_COMMUNITY): Payer: Self-pay | Admitting: Internal Medicine

## 2022-11-24 VITALS — BP 136/82 | HR 57 | Ht 66.0 in | Wt 175.0 lb

## 2022-11-24 DIAGNOSIS — Z1231 Encounter for screening mammogram for malignant neoplasm of breast: Secondary | ICD-10-CM

## 2022-11-24 DIAGNOSIS — D229 Melanocytic nevi, unspecified: Secondary | ICD-10-CM

## 2022-11-24 DIAGNOSIS — N1832 Chronic kidney disease, stage 3b: Secondary | ICD-10-CM | POA: Diagnosis not present

## 2022-11-24 DIAGNOSIS — R110 Nausea: Secondary | ICD-10-CM

## 2022-11-24 DIAGNOSIS — I10 Essential (primary) hypertension: Secondary | ICD-10-CM

## 2022-11-24 DIAGNOSIS — K219 Gastro-esophageal reflux disease without esophagitis: Secondary | ICD-10-CM | POA: Diagnosis not present

## 2022-11-24 DIAGNOSIS — G43119 Migraine with aura, intractable, without status migrainosus: Secondary | ICD-10-CM

## 2022-11-24 DIAGNOSIS — I6523 Occlusion and stenosis of bilateral carotid arteries: Secondary | ICD-10-CM

## 2022-11-24 DIAGNOSIS — K529 Noninfective gastroenteritis and colitis, unspecified: Secondary | ICD-10-CM

## 2022-11-24 MED ORDER — CARTIA XT 240 MG PO CP24: 240.0000 mg | ORAL_CAPSULE | Freq: Every day | ORAL | 3 refills | Status: DC

## 2022-11-24 MED ORDER — DEXLANSOPRAZOLE 60 MG PO CPDR: 1.0000 | DELAYED_RELEASE_CAPSULE | Freq: Every day | ORAL | 2 refills | Status: DC

## 2022-11-24 MED ORDER — METOCLOPRAMIDE HCL 5 MG PO TABS: 5.0000 mg | ORAL_TABLET | Freq: Three times a day (TID) | ORAL | 1 refills | Status: DC | PRN

## 2022-11-24 NOTE — Assessment & Plan Note (Signed)
Need to check BMP, but she wants to get it done at her Nephrology clinic Follows up with Nephrologist - Dr. Justin Mend Avoid nephrotoxic agents

## 2022-11-24 NOTE — Assessment & Plan Note (Signed)
Although it is worse due to recent stress, her headache description is likely migraine Avoid triptans due to history of CAD arterial disease renal artery aneurysm Fioricet as needed for headache, improved If frequent episodes of migraine, may need maintenance treatment

## 2022-11-24 NOTE — Assessment & Plan Note (Signed)
BP Readings from Last 1 Encounters:  11/24/22 136/82   Recently elevated due to stress at home and migraine Usually well-controlled with Atenolol and Cartia currently Counseled for compliance with the medications Advised DASH diet and moderate exercise/walking, at least 150 mins/week

## 2022-11-24 NOTE — Assessment & Plan Note (Signed)
Refilled Dexilant Has tried Omeprazole, Pantoprazole without relief Her nausea and epigastric pain can also likely due to GERD/gastritis

## 2022-11-24 NOTE — Progress Notes (Signed)
Acute Office Visit  Subjective:    Patient ID: Jaclyn Brooks, female    DOB: [birth year], 62 y.o.   MRN: [id number]  Chief Complaint  Patient presents with   Dehydration    Patient feels dehydrated. She also wants a referral to a dermatologist. She is also still having stomach pains     HPI Patient is in today for complaint nausea, vomiting and diarrhea on 02/13.  She had mac & cheese and grilled chicken at her workplace on that day, and started having nausea. She had 1 episode of watery BM, but diarrhea has resolved.  She had Zofran, but does not help with nausea.  She also reports gurgling sounds and mild abdominal discomfort.  Denies any fever, chills, melena or hematochezia.  She feels dehydrated, but reports that she has been trying to increase her fluid intake.  She denies to get blood tests done to check her kidney function. She prefers to get it done at Nephrology clinic.  She has numerous moles on her face, and requests dermatology referral for removal of skin moles.  Denies any recent change in size, shape or color of the moles.    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[facility])     Past Surgical History:  Procedure Laterality Date   CHOLECYSTECTOMY N/A 01/28/2015   Procedure: LAPAROSCOPIC CHOLECYSTECTOMY;  Surgeon: Aviva Signs Md, MD;  Location: [facility];  Service: General;  Laterality: N/A;   excision of bone spurs Bilateral    Big toes   renal aneurysm Right    right coil-and only has function of left kidney   TOTAL ABDOMINAL HYSTERECTOMY     X-STOP IMPLANTATION      Family History  Problem Relation Age of Onset   Hypertension Mother    Heart disease Mother        Cardiac Arrest    Hypertension Father    Cancer Father    Hypertension Sister    Cancer Brother        Bladder Cancer     Hypertension Brother    Hypertension Sister     Social History   Socioeconomic History   Marital status: Unknown    Spouse name: Not on file   Number of children: 1   Years of education: 12   Highest education level: Some college, no degree  Occupational History   Occupation: Retired  Tobacco Use   Smoking status: Former    Packs/day: 0.25    Years: 20.00    Total pack years: 5.00    Types: Cigarettes    Quit date: 2019    Years since quitting: 5.1   Smokeless tobacco: Never  Vaping Use   Vaping Use: Never used  Substance and Sexual Activity   Alcohol use: Not Currently    Comment: occasionally   Drug use: No   Sexual activity: Yes    Birth control/protection: Surgical  Other Topics Concern   Not on file  Social History Narrative   Not on file   Social Determinants of Health   Financial Resource Strain: Low Risk  (10/19/2021)   Overall Financial Resource Strain (CARDIA)    Difficulty of Paying Living Expenses: Not hard at all  Food Insecurity: No Food Insecurity (10/19/2021)  Hunger Vital Sign    Worried About Running Out of Food in the Last Year: Never true    Ran Out of Food in the Last Year: Never true  Transportation Needs: No Transportation Needs (10/19/2021)   PRAPARE - Hydrologist (Medical): No    Lack of Transportation (Non-Medical): No  Physical Activity: Sufficiently Active (10/19/2021)   Exercise Vital Sign    Days of Exercise per Week: 5 days    Minutes of Exercise per Session: 30 min  Stress: No Stress Concern Present (10/19/2021)   [place]    Feeling of Stress : Not at all  Social Connections: Moderately Isolated (10/19/2021)   Social Connection and Isolation Panel [NHANES]    Frequency of Communication with Friends and Family: More than three times a week    Frequency of Social Gatherings with Friends and Family: More than three times a week     Attends Religious Services: More than 4 times per year    Active Member of [employer] or Organizations: No    Attends Archivist Meetings: Never    Marital Status: Never married  Intimate Partner Violence: Not At Risk (10/19/2021)   Humiliation, Afraid, Rape, and Kick questionnaire    Fear of Current or Ex-Partner: No    Emotionally Abused: No    Physically Abused: No    Sexually Abused: No    Outpatient Medications Prior to Visit  Medication Sig Dispense Refill   albuterol (VENTOLIN HFA) 108 (90 Base) MCG/ACT inhaler Inhale 1-2 puffs into the lungs every 6 (six) hours as needed for wheezing or shortness of breath. 1 each 2   allopurinol (ZYLOPRIM) 100 MG tablet Take 2 tablets (200 mg total) by mouth daily. 60 tablet 5   atenolol (TENORMIN) 50 MG tablet Take 1 tablet by mouth once daily 90 tablet 0   budesonide-formoterol (SYMBICORT) 80-4.5 MCG/ACT inhaler Inhale 2 puffs into the lungs 2 (two) times daily. 1 each 2   busPIRone (BUSPAR) 5 MG tablet Take 1 tablet (5 mg total) by mouth 2 (two) times daily. 60 tablet 3   butalbital-acetaminophen-caffeine (FIORICET) 50-325-40 MG tablet Take 1 tablet by mouth every 12 (twelve) hours as needed for headache or migraine. 20 tablet 0   clopidogrel (PLAVIX) 75 MG tablet Take 1 tablet by mouth in the evening 90 tablet 0   hydrochlorothiazide (HYDRODIURIL) 12.5 MG tablet Take 12.5 mg by mouth daily.     hydrocortisone (ANUSOL-HC) 2.5 % rectal cream Place 1 application rectally 2 (two) times daily. 30 g 0   methocarbamol (ROBAXIN) 500 MG tablet Take 1 tablet (500 mg total) by mouth 2 (two) times daily. 30 tablet 1   oxyCODONE-acetaminophen (PERCOCET/ROXICET) 5-325 MG tablet Take 1 tablet by mouth every 6 (six) hours as needed for severe pain. 6 tablet 0   triamcinolone cream (KENALOG) 0.1 % Apply 1 Application topically 2 (two) times daily. 30 g 0   CARTIA XT 240 MG 24 hr capsule Take 1 capsule by mouth once daily 90 capsule 0   dexlansoprazole  (DEXILANT) 60 MG capsule Take 1 capsule by mouth once daily 15 capsule 0   ondansetron (ZOFRAN) 4 MG tablet Take 1 tablet (4 mg total) by mouth every 8 (eight) hours as needed for nausea or vomiting. 20 tablet 0   promethazine-dextromethorphan (PROMETHAZINE-DM) 6.25-15 MG/5ML syrup Take 5 mLs by mouth 4 (four) times daily as needed for cough. 118 mL 0  sodium chloride (OCEAN) 0.65 % SOLN nasal spray Place 1 spray into both nostrils as needed for congestion. 15 mL 0   No facility-administered medications prior to visit.    Allergies  Allergen Reactions   Bee Venom Hives   Nsaids     Kidney disease    Shellfish Allergy Itching    Review of Systems  Constitutional:  Negative for chills and fever.  HENT:  Negative for sinus pressure and sinus pain.   Eyes:  Negative for pain and discharge.  Respiratory:  Negative for cough and shortness of breath.   Cardiovascular:  Negative for chest pain and palpitations.  Gastrointestinal:  Positive for nausea and vomiting. Negative for abdominal pain.  Endocrine: Negative for polydipsia and polyuria.  Genitourinary:  Negative for dysuria and hematuria.  Musculoskeletal:  Positive for arthralgias, back pain and neck pain. Negative for neck stiffness.  Skin:  Negative for rash.  Neurological:  Positive for headaches. Negative for dizziness and weakness.  Psychiatric/Behavioral:  Positive for agitation. Negative for behavioral problems. The patient is nervous/anxious.        Objective:    Physical Exam Vitals reviewed.  Constitutional:      General: She is not in acute distress.    Appearance: She is not diaphoretic.  HENT:     Head: Normocephalic and atraumatic.     Mouth/Throat:     Mouth: Mucous membranes are moist.  Eyes:     General: No scleral icterus.    Extraocular Movements: Extraocular movements intact.  Cardiovascular:     Rate and Rhythm: Normal rate and regular rhythm.     Pulses: Normal pulses.     Heart sounds: Normal  heart sounds. No murmur heard. Pulmonary:     Breath sounds: Normal breath sounds. No wheezing or rales.  Abdominal:     General: Bowel sounds are normal.     Palpations: Abdomen is soft.     Tenderness: There is no abdominal tenderness.  Musculoskeletal:     Cervical back: Neck supple. No tenderness.     Right lower leg: No edema.     Left lower leg: No edema.  Skin:    General: Skin is warm.     Findings: Lesion (Moles over face area) present. No rash.     Comments: Inclusion cyst over left shoulder  Neurological:     General: No focal deficit present.     Mental Status: She is alert and oriented to person, place, and time.     Sensory: No sensory deficit.     Motor: No weakness.  Psychiatric:        Mood and Affect: Mood normal.        Behavior: Behavior normal.     BP 136/82 (BP Location: Left Arm, Cuff Size: Normal)   Pulse (!) 57   Ht 5' 6"$  (1.676 m)   Wt 175 lb (79.4 kg)   SpO2 98%   BMI 28.25 kg/m  Wt Readings from Last 3 Encounters:  11/24/22 175 lb (79.4 kg)  09/12/22 169 lb 1.9 oz (76.7 kg)  08/31/22 171 lb 6.4 oz (77.7 kg)        Assessment & Plan:   Problem List Items Addressed This Visit       Cardiovascular and Mediastinum   Hypertension    BP Readings from Last 1 Encounters:  11/24/22 136/82  Recently elevated due to stress at home and migraine Usually well-controlled with Atenolol and Cartia currently Counseled for compliance with the  medications Advised DASH diet and moderate exercise/walking, at least 150 mins/week      Relevant Medications   CARTIA XT 240 MG 24 hr capsule   Carotid arterial disease (HCC)   Relevant Medications   CARTIA XT 240 MG 24 hr capsule   Intractable migraine with aura without status migrainosus    Although it is worse due to recent stress, her headache description is likely migraine Avoid triptans due to history of CAD arterial disease renal artery aneurysm Fioricet as needed for headache, improved If  frequent episodes of migraine, may need maintenance treatment       Relevant Medications   CARTIA XT 240 MG 24 hr capsule     Digestive   GERD (gastroesophageal reflux disease)    Refilled Dexilant Has tried Omeprazole, Pantoprazole without relief Her nausea and epigastric pain can also likely due to GERD/gastritis      Relevant Medications   metoCLOPramide (REGLAN) 5 MG tablet   dexlansoprazole (DEXILANT) 60 MG capsule   Gastroenteritis - Primary    Recent episode of gastroenteritis/food poisoning Most of the gastro enteritis are self resolving Reglan as needed for nausea Maintain adequate hydration Diarrhea has resolved now         Genitourinary   CKD (chronic kidney disease) stage 3, GFR 30-59 ml/min (HCC)    Need to check BMP, but she wants to get it done at her Nephrology clinic Follows up with Nephrologist - Dr. Justin Mend Avoid nephrotoxic agents        Other   Nausea   Relevant Medications   metoCLOPramide (REGLAN) 5 MG tablet   Other Visit Diagnoses     Numerous skin moles       Relevant Orders   Ambulatory referral to Dermatology        Meds ordered this encounter  Medications   metoCLOPramide (REGLAN) 5 MG tablet    Sig: Take 1 tablet (5 mg total) by mouth every 8 (eight) hours as needed for nausea.    Dispense:  20 tablet    Refill:  1   CARTIA XT 240 MG 24 hr capsule    Sig: Take 1 capsule (240 mg total) by mouth daily.    Dispense:  90 capsule    Refill:  3   dexlansoprazole (DEXILANT) 60 MG capsule    Sig: Take 1 capsule (60 mg total) by mouth daily.    Dispense:  30 capsule    Refill:  2     Taitum Menton Keith Rake, MD

## 2022-11-24 NOTE — Patient Instructions (Addendum)
Please take Reglan as needed for nausea.  Please continue to take at least 64 ounces of fluid in a day.  Please continue to follow low salt diet and ambulate as tolerated.

## 2022-11-24 NOTE — Assessment & Plan Note (Signed)
Recent episode of gastroenteritis/food poisoning Most of the gastro enteritis are self resolving Reglan as needed for nausea Maintain adequate hydration Diarrhea has resolved now

## 2022-11-25 ENCOUNTER — Telehealth: Payer: Self-pay | Admitting: Internal Medicine

## 2022-11-25 NOTE — Telephone Encounter (Signed)
Pt called stating she forgot to get a note for being oow 2/15-2/16 & return on 2/17. Can you please write this?

## 2022-11-25 NOTE — Telephone Encounter (Signed)
Typed and printed

## 2022-11-29 ENCOUNTER — Other Ambulatory Visit: Payer: Self-pay | Admitting: Internal Medicine

## 2022-11-29 DIAGNOSIS — I6523 Occlusion and stenosis of bilateral carotid arteries: Secondary | ICD-10-CM

## 2022-12-05 ENCOUNTER — Ambulatory Visit (HOSPITAL_COMMUNITY): Payer: Medicare Other

## 2022-12-05 ENCOUNTER — Ambulatory Visit (HOSPITAL_COMMUNITY)
Admission: RE | Admit: 2022-12-05 | Discharge: 2022-12-05 | Disposition: A | Discharge status: Home / Self Care | Payer: Medicare Other | Source: Ambulatory Visit | Attending: Internal Medicine | Admitting: Internal Medicine

## 2022-12-05 ENCOUNTER — Encounter (HOSPITAL_COMMUNITY): Payer: Self-pay

## 2022-12-05 DIAGNOSIS — Z1231 Encounter for screening mammogram for malignant neoplasm of breast: Secondary | ICD-10-CM | POA: Insufficient documentation

## 2022-12-08 ENCOUNTER — Encounter: Payer: Self-pay | Admitting: Radiology

## 2022-12-08 ENCOUNTER — Other Ambulatory Visit (HOSPITAL_COMMUNITY): Payer: Self-pay | Admitting: Internal Medicine

## 2022-12-08 DIAGNOSIS — R928 Other abnormal and inconclusive findings on diagnostic imaging of breast: Secondary | ICD-10-CM

## 2022-12-12 ENCOUNTER — Other Ambulatory Visit: Payer: Self-pay | Admitting: Internal Medicine

## 2022-12-12 DIAGNOSIS — M1A0690 Idiopathic chronic gout, unspecified knee, without tophus (tophi): Secondary | ICD-10-CM

## 2022-12-14 IMAGING — DX DG ELBOW COMPLETE 3+V*R*
4 series · 4 of 4 positions shown · non-contrast
Comparison: Elbow radiographs 04/17/2017

CLINICAL DATA: Pain, swelling

EXAM:
RIGHT ELBOW - COMPLETE 3+ VIEW

[elbow ap]
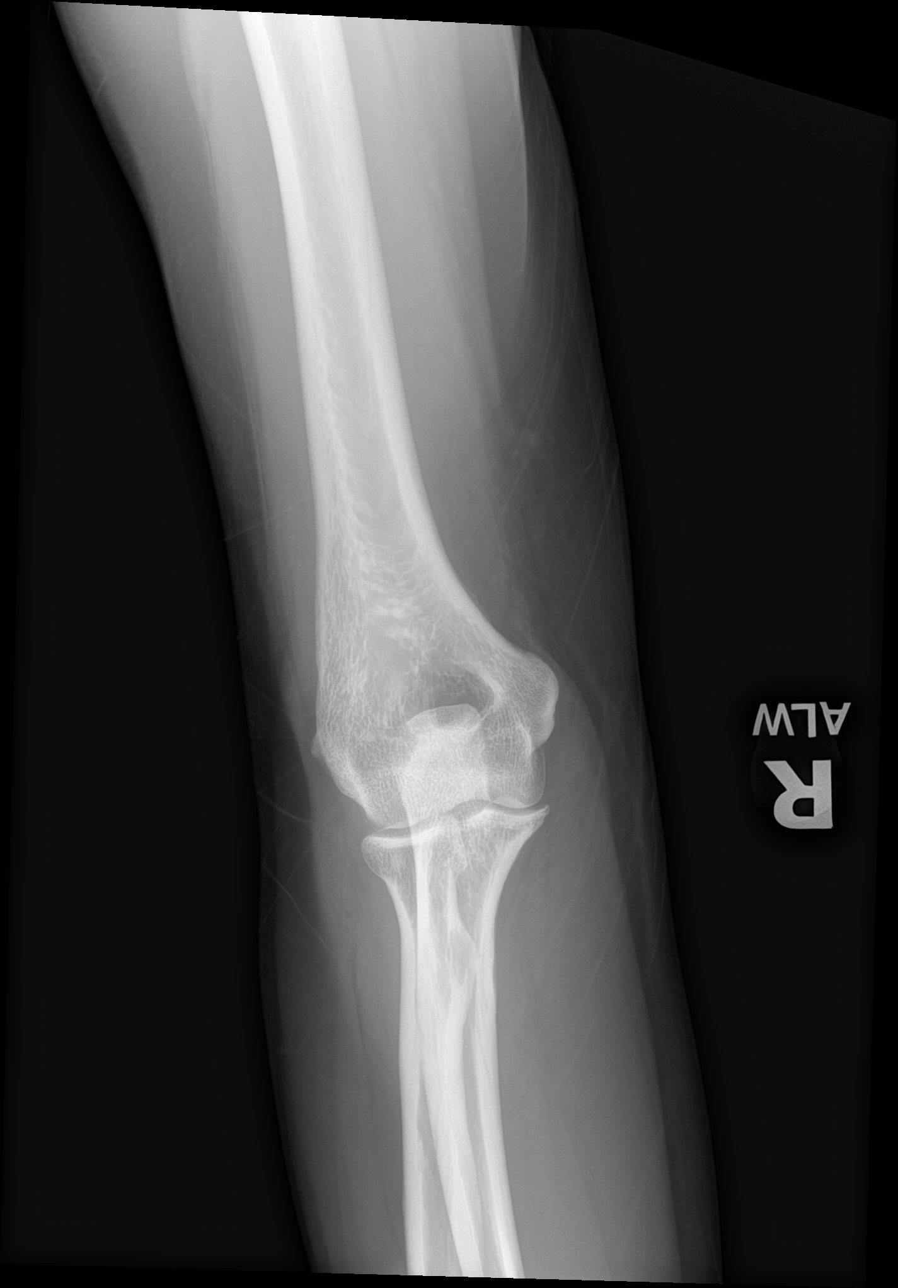

[elbow obl (1 of 2)]
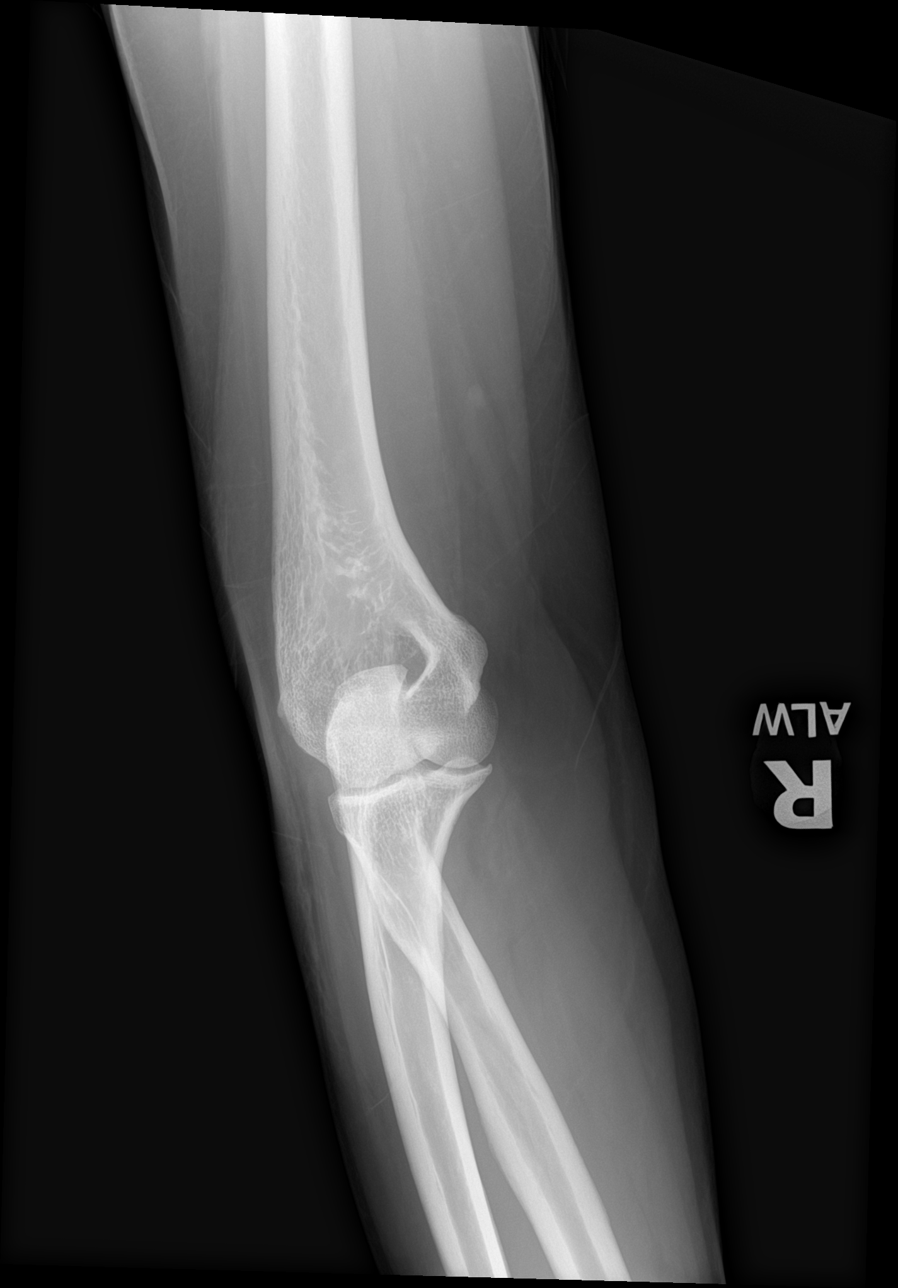

[elbow obl (2 of 2)]
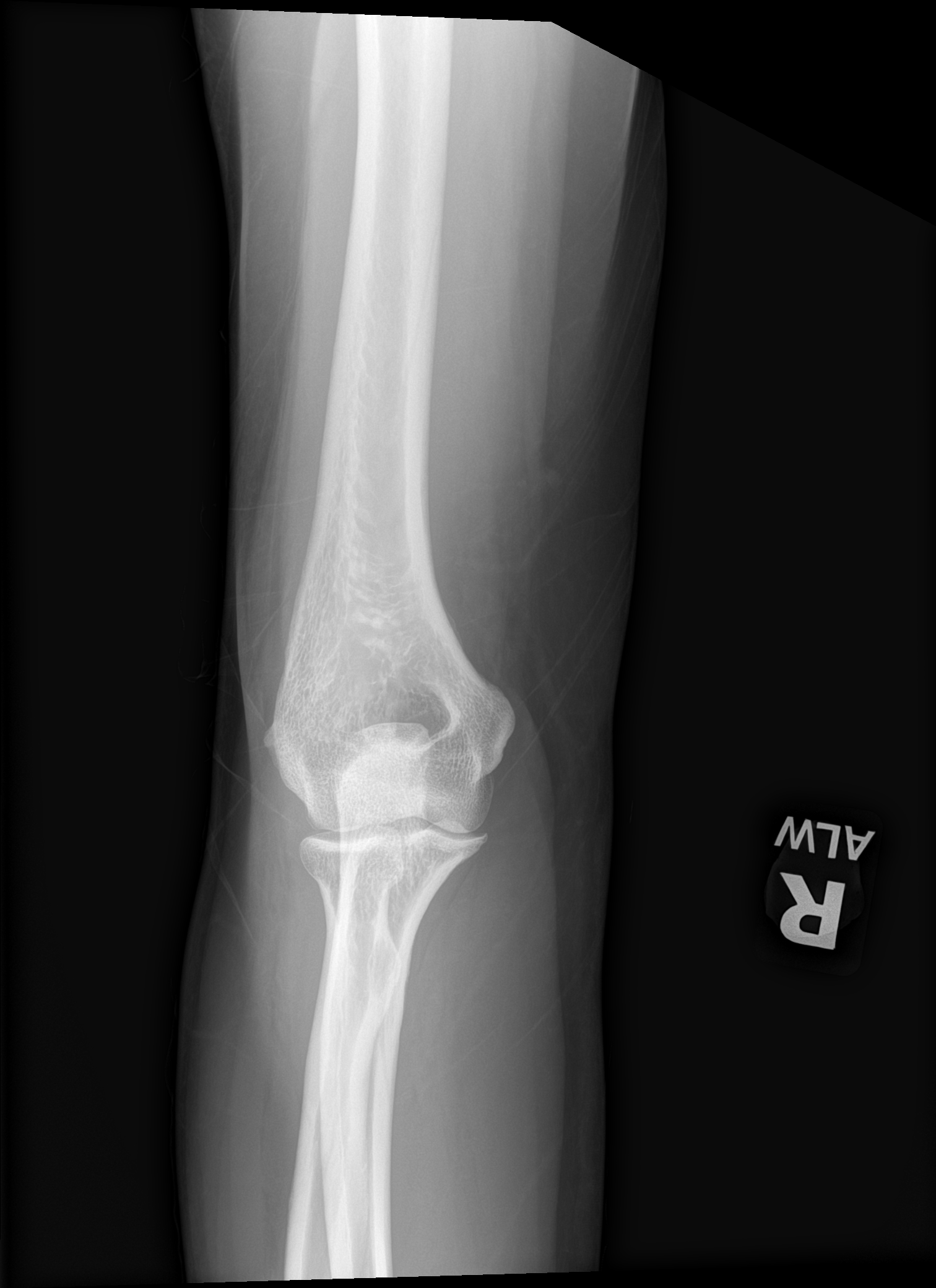

[elbow lat]
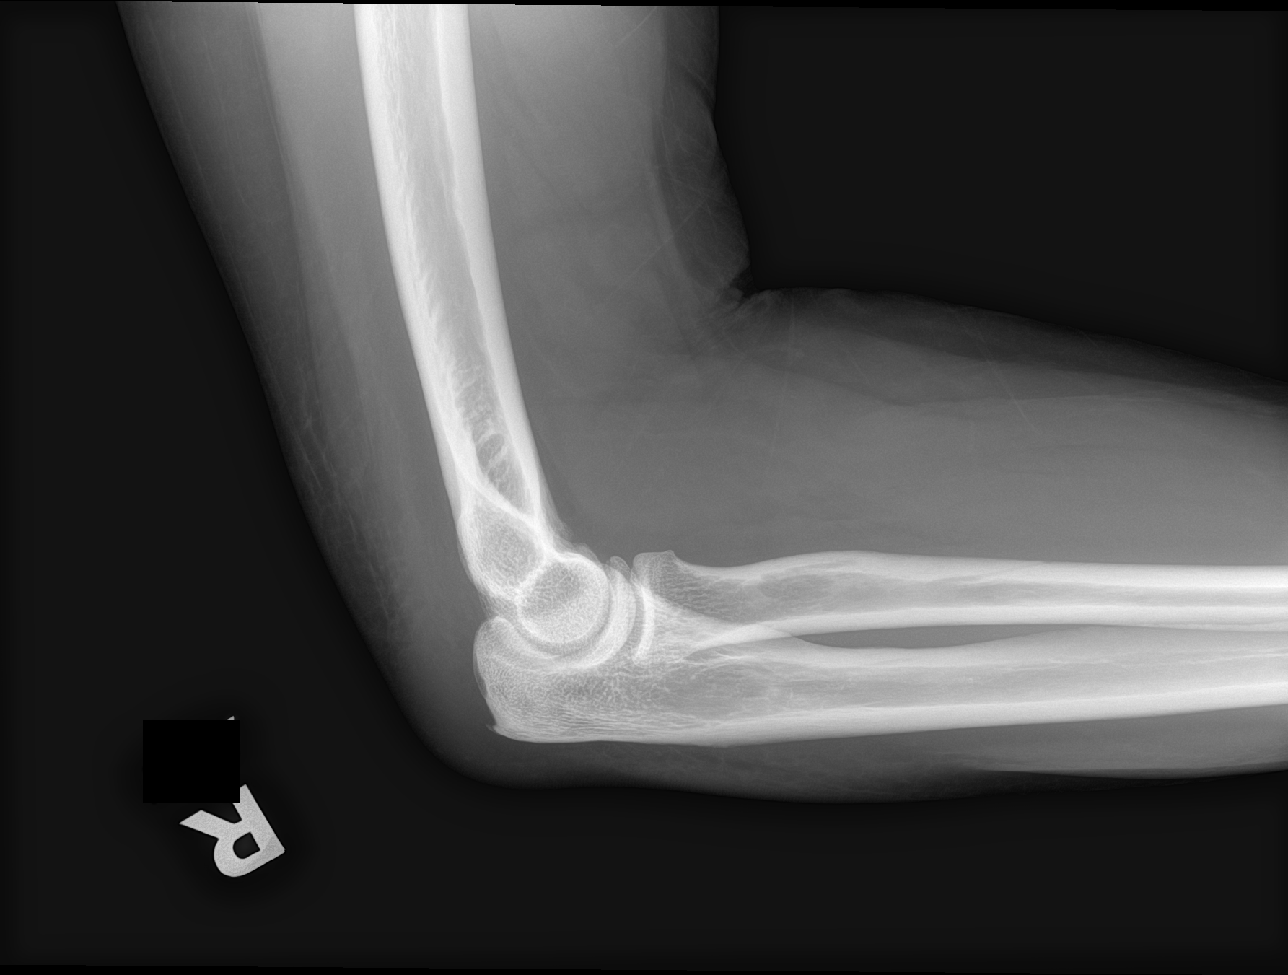

[4 of 4 positions shown; findings below may reference images not displayed]

FINDINGS: There is no acute fracture or dislocation. Elbow alignment is
normal. The joint spaces are preserved. There is minimal spurring of
the olecranon with mild overlying soft tissue swelling. There is no
effusion.
IMPRESSION: 1. Mild swelling over the olecranon. Correlate for signs or symptoms
of bursitis.
2. No acute osseous abnormality.

## 2022-12-16 ENCOUNTER — Other Ambulatory Visit: Payer: Medicare Other

## 2022-12-20 ENCOUNTER — Ambulatory Visit: Payer: Medicare Other | Admitting: Internal Medicine

## 2022-12-20 ENCOUNTER — Encounter: Payer: Self-pay | Admitting: Internal Medicine

## 2022-12-22 ENCOUNTER — Encounter: Payer: Self-pay | Admitting: Internal Medicine

## 2022-12-22 ENCOUNTER — Ambulatory Visit (INDEPENDENT_AMBULATORY_CARE_PROVIDER_SITE_OTHER): Payer: Medicare Other | Admitting: Internal Medicine

## 2022-12-22 VITALS — BP 138/60 | HR 50 | Ht 66.0 in | Wt 173.8 lb

## 2022-12-22 DIAGNOSIS — J209 Acute bronchitis, unspecified: Secondary | ICD-10-CM | POA: Diagnosis not present

## 2022-12-22 DIAGNOSIS — J0110 Acute frontal sinusitis, unspecified: Secondary | ICD-10-CM | POA: Diagnosis not present

## 2022-12-22 MED ORDER — AZITHROMYCIN 250 MG PO TABS: ORAL_TABLET | ORAL | 0 refills | Status: AC

## 2022-12-22 MED ORDER — ALBUTEROL SULFATE HFA 108 (90 BASE) MCG/ACT IN AERS: 1.0000 | INHALATION_SPRAY | Freq: Four times a day (QID) | RESPIRATORY_TRACT | 2 refills | Status: DC | PRN

## 2022-12-22 MED ORDER — METHYLPREDNISOLONE 4 MG PO TBPK: ORAL_TABLET | ORAL | 0 refills | Status: DC

## 2022-12-22 NOTE — Assessment & Plan Note (Signed)
Started empiric azithromycin Nasal saline spray or Flonase as needed Advised to use humidifier or vaporizer for nasal congestion

## 2022-12-22 NOTE — Patient Instructions (Signed)
Please start taking Azithromycin and Prednisone as prescribed.  Please take Robitussin or Mucinex as needed for cough.  Please use Flonase for nasal congestion.  Please use humidifier or vaporizer for nasal congestion.

## 2022-12-22 NOTE — Assessment & Plan Note (Addendum)
Has cough and wheezing at home Started Medrol Dosepak Continue albuterol as needed for dyspnea or wheezing Mucinex or Robitussin as needed for cough

## 2022-12-22 NOTE — Progress Notes (Signed)
Acute Office Visit  Subjective:    Patient ID: Jaclyn Brooks, female    DOB: 18-Jan-1961, 62 y.o.   MRN: [id number]  Chief Complaint  Patient presents with   Nasal Congestion    Patient is having nasal congestion, chest congestion, headache, has difficulty trying to cough up mucus.    HPI Patient is in today for complaint of nasal congestion, sinus pressure related headache, postnasal drip and cough with wheezing for the last 2 weeks.  She denies any fever or chills.  She has tried using OTC antitussive and albuterol inhaler without much relief.  Denies any recent sick contacts.  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[facility])     Past Surgical History:  Procedure Laterality Date   CHOLECYSTECTOMY N/A 01/28/2015   Procedure: LAPAROSCOPIC CHOLECYSTECTOMY;  Surgeon: Aviva Signs Md, MD;  Location: [facility];  Service: General;  Laterality: N/A;   excision of bone spurs Bilateral    Big toes   renal aneurysm Right    right coil-and only has function of left kidney   TOTAL ABDOMINAL HYSTERECTOMY     X-STOP IMPLANTATION      Family History  Problem Relation Age of Onset   Hypertension Mother    Heart disease Mother        Cardiac Arrest    Hypertension Father    Cancer Father    Hypertension Sister    Cancer Brother        Bladder Cancer    Hypertension Brother    Hypertension Sister     Social History   Socioeconomic History   Marital status: Unknown    Spouse name: Not on file   Number of children: 1   Years of education: 12   Highest education level: Some college, no degree  Occupational History   Occupation: Retired  Tobacco Use   Smoking status: Former    Packs/day: 0.25    Years: 20.00    Additional pack years: 0.00    Total pack years: 5.00    Types: Cigarettes    Quit date: 2019     Years since quitting: 5.2   Smokeless tobacco: Never  Vaping Use   Vaping Use: Never used  Substance and Sexual Activity   Alcohol use: Not Currently    Comment: occasionally   Drug use: No   Sexual activity: Yes    Birth control/protection: Surgical  Other Topics Concern   Not on file  Social History Narrative   Not on file   Social Determinants of Health   Financial Resource Strain: Low Risk  (10/19/2021)   Overall Financial Resource Strain (CARDIA)    Difficulty of Paying Living Expenses: Not hard at all  Food Insecurity: No Food Insecurity (10/19/2021)   Hunger Vital Sign    Worried About Running Out of Food in the Last Year: Never true    Ran Out of Food in the Last Year: Never true  Transportation Needs: No Transportation Needs (10/19/2021)   PRAPARE - Hydrologist (Medical): No    Lack of Transportation (Non-Medical): No  Physical Activity: Sufficiently Active (10/19/2021)   Exercise Vital Sign    Days of Exercise per Week: 5 days    Minutes  of Exercise per Session: 30 min  Stress: No Stress Concern Present (10/19/2021)   [place]    Feeling of Stress : Not at all  Social Connections: Moderately Isolated (10/19/2021)   Social Connection and Isolation Panel [NHANES]    Frequency of Communication with Friends and Family: More than three times a week    Frequency of Social Gatherings with Friends and Family: More than three times a week    Attends Religious Services: More than 4 times per year    Active Member of [employer] or Organizations: No    Attends Archivist Meetings: Never    Marital Status: Never married  Intimate Partner Violence: Not At Risk (10/19/2021)   Humiliation, Afraid, Rape, and Kick questionnaire    Fear of Current or Ex-Partner: No    Emotionally Abused: No    Physically Abused: No    Sexually Abused: No    Outpatient Medications Prior to Visit   Medication Sig Dispense Refill   allopurinol (ZYLOPRIM) 100 MG tablet Take 2 tablets by mouth once daily 60 tablet 0   atenolol (TENORMIN) 50 MG tablet Take 1 tablet by mouth once daily 90 tablet 0   budesonide-formoterol (SYMBICORT) 80-4.5 MCG/ACT inhaler Inhale 2 puffs into the lungs 2 (two) times daily. 1 each 2   busPIRone (BUSPAR) 5 MG tablet Take 1 tablet (5 mg total) by mouth 2 (two) times daily. 60 tablet 3   butalbital-acetaminophen-caffeine (FIORICET) 50-325-40 MG tablet Take 1 tablet by mouth every 12 (twelve) hours as needed for headache or migraine. 20 tablet 0   CARTIA XT 240 MG 24 hr capsule Take 1 capsule (240 mg total) by mouth daily. 90 capsule 3   clopidogrel (PLAVIX) 75 MG tablet Take 1 tablet by mouth in the evening 90 tablet 0   dexlansoprazole (DEXILANT) 60 MG capsule Take 1 capsule (60 mg total) by mouth daily. 30 capsule 2   hydrochlorothiazide (HYDRODIURIL) 12.5 MG tablet Take 12.5 mg by mouth daily.     hydrocortisone (ANUSOL-HC) 2.5 % rectal cream Place 1 application rectally 2 (two) times daily. 30 g 0   methocarbamol (ROBAXIN) 500 MG tablet Take 1 tablet (500 mg total) by mouth 2 (two) times daily. 30 tablet 1   metoCLOPramide (REGLAN) 5 MG tablet Take 1 tablet (5 mg total) by mouth every 8 (eight) hours as needed for nausea. 20 tablet 1   oxyCODONE-acetaminophen (PERCOCET/ROXICET) 5-325 MG tablet Take 1 tablet by mouth every 6 (six) hours as needed for severe pain. 6 tablet 0   triamcinolone cream (KENALOG) 0.1 % Apply 1 Application topically 2 (two) times daily. 30 g 0   albuterol (VENTOLIN HFA) 108 (90 Base) MCG/ACT inhaler Inhale 1-2 puffs into the lungs every 6 (six) hours as needed for wheezing or shortness of breath. 1 each 2   No facility-administered medications prior to visit.    Allergies  Allergen Reactions   Bee Venom Hives   Nsaids     Kidney disease    Shellfish Allergy Itching    Review of Systems  Constitutional:  Negative for chills and  fever.  HENT:  Positive for congestion, postnasal drip, sinus pressure and sinus pain.   Eyes:  Negative for pain and discharge.  Respiratory:  Positive for cough and wheezing. Negative for shortness of breath.   Cardiovascular:  Negative for chest pain and palpitations.  Gastrointestinal:  Negative for abdominal pain, nausea and vomiting.  Endocrine: Negative for polydipsia  and polyuria.  Genitourinary:  Negative for dysuria and hematuria.  Musculoskeletal:  Positive for arthralgias, back pain and neck pain. Negative for neck stiffness.  Skin:  Negative for rash.  Neurological:  Positive for headaches. Negative for dizziness and weakness.  Psychiatric/Behavioral:  Positive for agitation. Negative for behavioral problems. The patient is nervous/anxious.        Objective:    Physical Exam Vitals reviewed.  Constitutional:      General: She is not in acute distress.    Appearance: She is not diaphoretic.  HENT:     Head: Normocephalic and atraumatic.     Mouth/Throat:     Mouth: Mucous membranes are moist.  Eyes:     General: No scleral icterus.    Extraocular Movements: Extraocular movements intact.  Cardiovascular:     Rate and Rhythm: Normal rate and regular rhythm.     Pulses: Normal pulses.     Heart sounds: Normal heart sounds. No murmur heard. Pulmonary:     Breath sounds: Normal breath sounds. No wheezing or rales.  Musculoskeletal:     Cervical back: Neck supple. No tenderness.     Right lower leg: No edema.     Left lower leg: No edema.  Skin:    General: Skin is warm.     Findings: Lesion (Moles over face area) present. No rash.     Comments: Inclusion cyst over left shoulder  Neurological:     General: No focal deficit present.     Mental Status: She is alert and oriented to person, place, and time.     Sensory: No sensory deficit.     Motor: No weakness.  Psychiatric:        Mood and Affect: Mood normal.        Behavior: Behavior normal.     BP 138/60  (BP Location: Right Arm)   Pulse (!) 50   Ht '5\' 6"'$  (1.676 m)   Wt 173 lb 12.8 oz (78.8 kg)   SpO2 97%   BMI 28.05 kg/m  Wt Readings from Last 3 Encounters:  12/22/22 173 lb 12.8 oz (78.8 kg)  11/24/22 175 lb (79.4 kg)  09/12/22 169 lb 1.9 oz (76.7 kg)        Assessment & Plan:   Problem List Items Addressed This Visit       Respiratory   Acute frontal sinusitis - Primary    Started empiric azithromycin Nasal saline spray or Flonase as needed Advised to use humidifier or vaporizer for nasal congestion      Relevant Medications   azithromycin (ZITHROMAX) 250 MG tablet   methylPREDNISolone (MEDROL DOSEPAK) 4 MG TBPK tablet   Acute bronchitis    Has cough and wheezing at home Started Medrol Dosepak Continue albuterol as needed for dyspnea or wheezing Mucinex or Robitussin as needed for cough      Relevant Medications   methylPREDNISolone (MEDROL DOSEPAK) 4 MG TBPK tablet   albuterol (VENTOLIN HFA) 108 (90 Base) MCG/ACT inhaler     Meds ordered this encounter  Medications   azithromycin (ZITHROMAX) 250 MG tablet    Sig: Take 2 tablets on day 1, then 1 tablet daily on days 2 through 5    Dispense:  6 tablet    Refill:  0   methylPREDNISolone (MEDROL DOSEPAK) 4 MG TBPK tablet    Sig: Take as package instructions.    Dispense:  1 each    Refill:  0   albuterol (VENTOLIN HFA) 108 (90  Base) MCG/ACT inhaler    Sig: Inhale 1-2 puffs into the lungs every 6 (six) hours as needed for wheezing or shortness of breath.    Dispense:  1 each    Refill:  2     Jakaiya Netherland Keith Rake, MD

## 2023-01-08 ENCOUNTER — Encounter: Payer: Self-pay | Admitting: Emergency Medicine

## 2023-01-08 ENCOUNTER — Ambulatory Visit
Admission: EM | Admit: 2023-01-08 | Discharge: 2023-01-08 | Disposition: A | Discharge status: Home / Self Care | Payer: Medicare Other | Attending: Nurse Practitioner | Admitting: Nurse Practitioner

## 2023-01-08 DIAGNOSIS — G43001 Migraine without aura, not intractable, with status migrainosus: Secondary | ICD-10-CM | POA: Diagnosis not present

## 2023-01-08 MED ORDER — SUMATRIPTAN SUCCINATE 6 MG/0.5ML ~~LOC~~ SOLN
6.0000 mg | Freq: Once | SUBCUTANEOUS | Status: AC
  Administered 2023-01-08: 6 mg via SUBCUTANEOUS

## 2023-01-08 NOTE — ED Triage Notes (Signed)
Headache since yesterday.  States body feels sore.  Has taken fioricet without relief

## 2023-01-08 NOTE — ED Triage Notes (Signed)
Patient states she needs a work note

## 2023-01-08 NOTE — ED Provider Notes (Signed)
[facility]    CSN: [id number] Arrival date & time: 01/08/23  1202      History   Chief Complaint No chief complaint on file.   HPI Jaclyn Brooks is a 62 y.o. female.   The history is provided by the patient.   The patient presents for complaints of headache that started over the past 1 to 2 days.  She states since her headache started, "my whole body feels sore".  She denies fever, chills, dizziness, blurred vision, light sensitivity, sound sensitivity, ear pain, cough, abdominal pain, nausea, vomiting, or diarrhea.  Patient states that she does have some feelings of lightheadedness.  With a history of migraine headaches, states her last headache was approximately 3 weeks ago.  States that she takes Fioricet for her headache, took medication today, but it did not help.  She states that she has noticed that her headaches are becoming more severe in nature.  She states that she also has to decrease her activity when her headaches are present.  She states that she is currently treated by her PCP for her headache pain.  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[facility])     Patient Active Problem List   Diagnosis Date Noted   Acute bronchitis 12/22/2022   Intractable migraine with aura without status migrainosus 08/15/2022   Acute otitis externa of right ear 07/13/2022   Traumatic hematoma of right lower leg 07/13/2022   History of total abdominal hysterectomy 07/04/2022   GAD (generalized anxiety disorder) 04/28/2022   Caregiver stress 04/28/2022   Chronic left-sided low back pain without sciatica 04/13/2022   Contact dermatitis 04/13/2022   Primary osteoarthritis of right knee 03/09/2022   Refused influenza vaccine 12/02/2021   Encounter for general adult medical examination with abnormal  findings 11/30/2021   Epidermal cyst 11/30/2021   Acute frontal sinusitis 09/21/2021   Allergic rhinitis 08/29/2021   Nausea 08/27/2021   Hemorrhoids 07/07/2021   Gout 03/25/2021   Diabetes mellitus without complication ([place]) [id number]   Primary osteoarthritis of right foot 05/12/2020   GERD (gastroesophageal reflux disease) 05/31/2019   Solitary kidney, acquired 05/10/2019   Carotid arterial disease ([place]) 12/11/2018   COPD, mild ([place]) 10/01/2018   CKD (chronic kidney disease) stage 3, GFR 30-59 ml/min ([facility]) 10/01/2018   Aortic valve sclerosis 10/01/2018   Hypertension 09/11/2018    Past Surgical History:  Procedure Laterality Date   CHOLECYSTECTOMY N/A 01/28/2015   Procedure: LAPAROSCOPIC CHOLECYSTECTOMY;  Surgeon: Aviva Signs Md, MD;  Location: [facility];  Service: General;  Laterality: N/A;   excision of bone spurs Bilateral    Big toes   renal aneurysm Right    right coil-and only has function of left kidney   TOTAL ABDOMINAL HYSTERECTOMY     X-STOP IMPLANTATION      OB History     Gravida  1   Para  1   Term      Preterm  1   AB      Living         SAB      IAB      Ectopic      Multiple      Live Births  Home Medications    Prior to Admission medications   Medication Sig Start Date End Date Taking? Authorizing Provider  albuterol (VENTOLIN HFA) 108 (90 Base) MCG/ACT inhaler Inhale 1-2 puffs into the lungs every 6 (six) hours as needed for wheezing or shortness of breath. 12/22/22   Lindell Spar, MD  allopurinol (ZYLOPRIM) 100 MG tablet Take 2 tablets by mouth once daily 12/12/22   Lindell Spar, MD  atenolol (TENORMIN) 50 MG tablet Take 1 tablet by mouth once daily 11/21/22   Lindell Spar, MD  budesonide-formoterol [facility]) 80-4.5 MCG/ACT inhaler Inhale 2 puffs into the lungs 2 (two) times daily. 10/21/21   Lindell Spar, MD  busPIRone (BUSPAR) 5 MG tablet Take 1 tablet (5 mg total) by mouth 2 (two) times daily. 07/20/22    Lindell Spar, MD  butalbital-acetaminophen-caffeine (FIORICET) [phone] MG tablet Take 1 tablet by mouth every 12 (twelve) hours as needed for headache or migraine. 08/15/22   Patel, Colin Broach, MD  CARTIA XT 240 MG 24 hr capsule Take 1 capsule (240 mg total) by mouth daily. 11/24/22   Lindell Spar, MD  clopidogrel (PLAVIX) 75 MG tablet Take 1 tablet by mouth in the evening 11/30/22   Lindell Spar, MD  dexlansoprazole (DEXILANT) 60 MG capsule Take 1 capsule (60 mg total) by mouth daily. 11/24/22   Lindell Spar, MD  hydrochlorothiazide (HYDRODIURIL) 12.5 MG tablet Take 12.5 mg by mouth daily. 03/21/22   [provider]  hydrocortisone (ANUSOL-HC) 2.5 % rectal cream Place 1 application rectally 2 (two) times daily. 07/07/21   Lindell Spar, MD  methocarbamol (ROBAXIN) 500 MG tablet Take 1 tablet (500 mg total) by mouth 2 (two) times daily. 04/13/22   Lindell Spar, MD  methylPREDNISolone (MEDROL DOSEPAK) 4 MG TBPK tablet Take as package instructions. 12/22/22   Lindell Spar, MD  metoCLOPramide (REGLAN) 5 MG tablet Take 1 tablet (5 mg total) by mouth every 8 (eight) hours as needed for nausea. 11/24/22   Lindell Spar, MD  oxyCODONE-acetaminophen (PERCOCET/ROXICET) 5-325 MG tablet Take 1 tablet by mouth every 6 (six) hours as needed for severe pain. 05/20/22   Davonna Belling, MD  triamcinolone cream (KENALOG) 0.1 % Apply 1 Application topically 2 (two) times daily. 04/13/22   Lindell Spar, MD  atorvastatin (LIPITOR) 20 MG tablet Take 1 tablet (20 mg total) by mouth at bedtime. 11/25/19 02/18/20  Alycia Rossetti, MD    Family History Family History  Problem Relation Age of Onset   Hypertension Mother    Heart disease Mother        Cardiac Arrest    Hypertension Father    Cancer Father    Hypertension Sister    Cancer Brother        Bladder Cancer    Hypertension Brother    Hypertension Sister     Social History Social History   Tobacco Use   Smoking status: Former     Packs/day: 0.25    Years: 20.00    Additional pack years: 0.00    Total pack years: 5.00    Types: Cigarettes    Quit date: 2019    Years since quitting: 5.2   Smokeless tobacco: Never  Vaping Use   Vaping Use: Never used  Substance Use Topics   Alcohol use: Not Currently    Comment: occasionally   Drug use: No     Allergies   Bee venom, Nsaids, and Shellfish allergy  Review of Systems Review of Systems Per HPI  Physical Exam Triage Vital Signs ED Triage Vitals  Enc Vitals Group     BP 01/08/23 1206 (!) 150/80     Pulse Rate 01/08/23 1206 (!) 59     Resp 01/08/23 1206 18     Temp 01/08/23 1206 98.7 F (37.1 C)     Temp Source 01/08/23 1206 Oral     SpO2 01/08/23 1206 98 %     Weight --      Height --      Head Circumference --      Peak Flow --      Pain Score 01/08/23 1207 8     Pain Loc --      Pain Edu? --      Excl. in [place]? --    No data found.  Updated Vital Signs BP (!) 150/80 (BP Location: Right Arm)   Pulse (!) 59   Temp 98.7 F (37.1 C) (Oral)   Resp 18   SpO2 98%   Visual Acuity Right Eye Distance:   Left Eye Distance:   Bilateral Distance:    Right Eye Near:   Left Eye Near:    Bilateral Near:     Physical Exam Vitals and nursing note reviewed.  Constitutional:      General: She is not in acute distress.    Appearance: Normal appearance.  HENT:     Head: Normocephalic.     Right Ear: Tympanic membrane, ear canal and external ear normal.     Left Ear: Tympanic membrane, ear canal and external ear normal.     Nose: Nose normal.     Mouth/Throat:     Mouth: Mucous membranes are moist.     Pharynx: No posterior oropharyngeal erythema.  Eyes:     Extraocular Movements: Extraocular movements intact.     Conjunctiva/sclera: Conjunctivae normal.     Pupils: Pupils are equal, round, and reactive to light.  Cardiovascular:     Rate and Rhythm: Normal rate and regular rhythm.     Pulses: Normal pulses.     Heart sounds: Normal  heart sounds.  Pulmonary:     Effort: Pulmonary effort is normal.     Breath sounds: Normal breath sounds.  Abdominal:     General: Bowel sounds are normal.     Palpations: Abdomen is soft.     Tenderness: There is no abdominal tenderness.  Musculoskeletal:     Cervical back: Normal range of motion.  Lymphadenopathy:     Cervical: No cervical adenopathy.  Skin:    General: Skin is warm and dry.  Neurological:     General: No focal deficit present.     Mental Status: She is alert and oriented to person, place, and time.     GCS: GCS eye subscore is 4. GCS verbal subscore is 5. GCS motor subscore is 6.     Cranial Nerves: Cranial nerves 2-12 are intact.     Sensory: Sensation is intact.     Motor: Motor function is intact.     Coordination: Coordination is intact.     Gait: Gait is intact.  Psychiatric:        Mood and Affect: Mood normal.        Behavior: Behavior normal.      UC Treatments / Results  Labs (all labs ordered are listed, but only abnormal results are displayed) Labs Reviewed - No data to display  EKG   Radiology  No results found.  Procedures Procedures (including critical care time)  Medications Ordered in UC Medications  SUMAtriptan (IMITREX) injection 6 mg (6 mg Subcutaneous Given 01/08/23 1228)    Initial Impression / Assessment and Plan / UC Course  I have reviewed the triage vital signs and the nursing notes.  Pertinent labs & imaging results that were available during my care of the patient were reviewed by me and considered in my medical decision making (see chart for details).  The patient is well-appearing, she is in no acute distress, although she is mildly high hypertensive, vital signs are otherwise stable.  Neurological exam is intact, no neurological deficits noted.  No concern for [facility], CVA, TIA.  Patient with noted history of migraines.  Patient was treated with Imitrex 6 mg subcutaneously for headache pain.  Patient does not have  any accompanying symptoms such as light sensitivity or sound sensitivity or nausea or vomiting at this time.  Patient advised to continue Fioricet that she is currently taking as needed.  Patient advised that if headache does not improve, or if she develops new symptoms such as blurred vision, change in vision, sudden loss of vision, change in her mental status, recommend that she follow-up in the emergency department for further evaluation.  Patient was given supportive care recommendations to include increasing her fluids, and allowing for plenty of rest.  Patient vies to follow-up with her primary care physician for reevaluation within the next 7 to 10 days.  Patient is in agreement with this plan of care, understanding was verbalized.  Patient stable for discharge.  Note was provided for work.  Final Clinical Impressions(s) / UC Diagnoses   Final diagnoses:  Migraine without aura and with status migrainosus, not intractable     Discharge Instructions      You have been given Imitrex 6 mg subcutaneously (under your skin). Continue Fioricet as needed for headache pain or discomfort. Increase fluids and allow for plenty of rest. Recommend a dimly lit room or using cool cloths crossed your forehead for headache pain or discomfort. If headache symptoms fail to improve, or you develop new symptoms such as dizziness, change in vision, sudden loss of vision, slurred speech, worsening dizziness, or other concerns, please follow-up in the emergency department immediately. Recommend following up with your primary care physician within the next 7 to 10 days for reevaluation. Follow-up as needed.     ED Prescriptions   None    PDMP not reviewed this encounter.   Tish Men, NP 01/08/23 1255

## 2023-01-08 NOTE — Discharge Instructions (Addendum)
You have been given Imitrex 6 mg subcutaneously (under your skin). Continue Fioricet as needed for headache pain or discomfort. Increase fluids and allow for plenty of rest. Recommend a dimly lit room or using cool cloths crossed your forehead for headache pain or discomfort. If headache symptoms fail to improve, or you develop new symptoms such as dizziness, change in vision, sudden loss of vision, slurred speech, worsening dizziness, or other concerns, please follow-up in the emergency department immediately. Recommend following up with your primary care physician within the next 7 to 10 days for reevaluation. Follow-up as needed.

## 2023-01-10 ENCOUNTER — Ambulatory Visit (HOSPITAL_COMMUNITY): Payer: Medicare Other

## 2023-01-10 ENCOUNTER — Encounter (HOSPITAL_COMMUNITY): Payer: Medicare Other

## 2023-01-10 ENCOUNTER — Encounter (HOSPITAL_COMMUNITY): Payer: Self-pay

## 2023-01-13 ENCOUNTER — Ambulatory Visit (INDEPENDENT_AMBULATORY_CARE_PROVIDER_SITE_OTHER): Payer: Medicare Other | Admitting: Internal Medicine

## 2023-01-13 ENCOUNTER — Encounter: Payer: Self-pay | Admitting: Internal Medicine

## 2023-01-13 VITALS — BP 132/76 | HR 52 | Ht 66.0 in | Wt 172.4 lb

## 2023-01-13 DIAGNOSIS — J209 Acute bronchitis, unspecified: Secondary | ICD-10-CM

## 2023-01-13 MED ORDER — CHERATUSSIN AC 100-10 MG/5ML PO SOLN
5.0000 mL | Freq: Three times a day (TID) | ORAL | 0 refills | Status: DC | PRN
Start: 2023-01-13 — End: 2023-07-06

## 2023-01-13 MED ORDER — PREDNISONE 20 MG PO TABS
20.0000 mg | ORAL_TABLET | Freq: Every day | ORAL | 0 refills | Status: DC
Start: 2023-01-13 — End: 2023-02-01

## 2023-01-13 NOTE — Progress Notes (Signed)
Acute Office Visit  Subjective:    Patient ID: Jaclyn Brooks, female    DOB: September 07, 1961, 62 y.o.   MRN: [id number]  Chief Complaint  Patient presents with   Cough    Patient was seen on 12/22/2022, still coughing and having phlegm.    HPI Patient is in today for persistent cough with clear sputum since 03/01.  She was recently treated with azithromycin and Medrol Dosepak for acute bronchitis and sinusitis.  She has noticed improvement in nasal congestion and postnasal drip.  She still has scratchy throat and cough.  Denies any fever or chills.  Denies any dyspnea or wheezing currently.  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Past Surgical History:  Procedure Laterality Date   CHOLECYSTECTOMY N/A 01/28/2015   Procedure: LAPAROSCOPIC CHOLECYSTECTOMY;  Surgeon: Franky MachoMark Jenkins Md, MD;  Location: [facility];  Service: General;  Laterality: N/A;   excision of bone spurs Bilateral    Big toes   renal aneurysm Right    right coil-and only has function of left kidney   TOTAL ABDOMINAL HYSTERECTOMY     X-STOP IMPLANTATION      Family History  Problem Relation Age of Onset   Hypertension Mother    Heart disease Mother        Cardiac Arrest    Hypertension Father    Cancer Father    Hypertension Sister    Cancer Brother        Bladder Cancer    Hypertension Brother    Hypertension Sister     Social History   Socioeconomic History   Marital status: Unknown    Spouse name: Not on file   Number of children: 1   Years of education: 12   Highest education level: Some college, no degree  Occupational History   Occupation: Retired  Tobacco Use   Smoking status: Former    Packs/day: 0.25    Years: 20.00    Additional pack years: 0.00    Total pack years: 5.00    Types: Cigarettes    Quit date:  2019    Years since quitting: 5.2   Smokeless tobacco: Never  Vaping Use   Vaping Use: Never used  Substance and Sexual Activity   Alcohol use: Not Currently    Comment: occasionally   Drug use: No   Sexual activity: Yes    Birth control/protection: Surgical  Other Topics Concern   Not on file  Social History Narrative   Not on file   Social Determinants of Health   Financial Resource Strain: Low Risk  (10/19/2021)   Overall Financial Resource Strain (CARDIA)    Difficulty of Paying Living Expenses: Not hard at all  Food Insecurity: No Food Insecurity (10/19/2021)   Hunger Vital Sign    Worried About Running Out of Food in the Last Year: Never true    Ran Out of Food in the Last Year: Never true  Transportation Needs: No Transportation Needs (10/19/2021)   PRAPARE - Administrator, Civil ServiceTransportation    Lack of Transportation (Medical): No    Lack of Transportation (Non-Medical): No  Physical Activity: Sufficiently Active (10/19/2021)   Exercise Vital Sign    Days of Exercise per Week: 5 days  Minutes of Exercise per Session: 30 min  Stress: No Stress Concern Present (10/19/2021)   [employer] - Occupational Stress Questionnaire    Feeling of Stress : Not at all  Social Connections: Moderately Isolated (10/19/2021)   Social Connection and Isolation Panel [NHANES]    Frequency of Communication with Friends and Family: More than three times a week    Frequency of Social Gatherings with Friends and Family: More than three times a week    Attends Religious Services: More than 4 times per year    Active Member of [employer]ubs or Organizations: No    Attends BankerClub or Organization Meetings: Never    Marital Status: Never married  Intimate Partner Violence: Not At Risk (10/19/2021)   Humiliation, Afraid, Rape, and Kick questionnaire    Fear of Current or Ex-Partner: No    Emotionally Abused: No    Physically Abused: No    Sexually Abused: No    Outpatient Medications Prior to  Visit  Medication Sig Dispense Refill   albuterol (VENTOLIN HFA) 108 (90 Base) MCG/ACT inhaler Inhale 1-2 puffs into the lungs every 6 (six) hours as needed for wheezing or shortness of breath. 1 each 2   allopurinol (ZYLOPRIM) 100 MG tablet Take 2 tablets by mouth once daily 60 tablet 0   atenolol (TENORMIN) 50 MG tablet Take 1 tablet by mouth once daily 90 tablet 0   budesonide-formoterol (SYMBICORT) 80-4.5 MCG/ACT inhaler Inhale 2 puffs into the lungs 2 (two) times daily. 1 each 2   busPIRone (BUSPAR) 5 MG tablet Take 1 tablet (5 mg total) by mouth 2 (two) times daily. 60 tablet 3   butalbital-acetaminophen-caffeine (FIORICET) 50-325-40 MG tablet Take 1 tablet by mouth every 12 (twelve) hours as needed for headache or migraine. 20 tablet 0   CARTIA XT 240 MG 24 hr capsule Take 1 capsule (240 mg total) by mouth daily. 90 capsule 3   clopidogrel (PLAVIX) 75 MG tablet Take 1 tablet by mouth in the evening 90 tablet 0   dexlansoprazole (DEXILANT) 60 MG capsule Take 1 capsule (60 mg total) by mouth daily. 30 capsule 2   hydrochlorothiazide (HYDRODIURIL) 12.5 MG tablet Take 12.5 mg by mouth daily.     hydrocortisone (ANUSOL-HC) 2.5 % rectal cream Place 1 application rectally 2 (two) times daily. 30 g 0   methocarbamol (ROBAXIN) 500 MG tablet Take 1 tablet (500 mg total) by mouth 2 (two) times daily. 30 tablet 1   metoCLOPramide (REGLAN) 5 MG tablet Take 1 tablet (5 mg total) by mouth every 8 (eight) hours as needed for nausea. 20 tablet 1   oxyCODONE-acetaminophen (PERCOCET/ROXICET) 5-325 MG tablet Take 1 tablet by mouth every 6 (six) hours as needed for severe pain. 6 tablet 0   triamcinolone cream (KENALOG) 0.1 % Apply 1 Application topically 2 (two) times daily. 30 g 0   methylPREDNISolone (MEDROL DOSEPAK) 4 MG TBPK tablet Take as package instructions. 1 each 0   No facility-administered medications prior to visit.    Allergies  Allergen Reactions   Bee Venom Hives   Nsaids     Kidney  disease    Shellfish Allergy Itching    Review of Systems  Constitutional:  Negative for chills and fever.  HENT:  Positive for sore throat. Negative for congestion, postnasal drip, sinus pressure and sinus pain.   Eyes:  Negative for pain and discharge.  Respiratory:  Positive for cough. Negative for shortness of breath and wheezing.   Cardiovascular:  Negative for chest pain and palpitations.  Gastrointestinal:  Negative for abdominal pain, nausea and vomiting.  Endocrine: Negative for polydipsia and polyuria.  Genitourinary:  Negative for dysuria and hematuria.  Musculoskeletal:  Positive for arthralgias, back pain and neck pain. Negative for neck stiffness.  Skin:  Negative for rash.  Neurological:  Positive for headaches. Negative for dizziness and weakness.  Psychiatric/Behavioral:  Positive for agitation. Negative for behavioral problems. The patient is nervous/anxious.        Objective:    Physical Exam Vitals reviewed.  Constitutional:      General: She is not in acute distress.    Appearance: She is not diaphoretic.  HENT:     Head: Normocephalic and atraumatic.     Mouth/Throat:     Mouth: Mucous membranes are moist.  Eyes:     General: No scleral icterus.    Extraocular Movements: Extraocular movements intact.  Cardiovascular:     Rate and Rhythm: Normal rate and regular rhythm.     Pulses: Normal pulses.     Heart sounds: Normal heart sounds. No murmur heard. Pulmonary:     Breath sounds: Normal breath sounds. No wheezing or rales.  Musculoskeletal:     Cervical back: Neck supple. No tenderness.     Right lower leg: No edema.     Left lower leg: No edema.  Skin:    General: Skin is warm.     Findings: Lesion (Moles over face area) present. No rash.     Comments: Inclusion cyst over left shoulder  Neurological:     General: No focal deficit present.     Mental Status: She is alert and oriented to person, place, and time.     Sensory: No sensory deficit.      Motor: No weakness.  Psychiatric:        Mood and Affect: Mood normal.        Behavior: Behavior normal.     BP 132/76 (BP Location: Left Arm, Cuff Size: Normal)   Pulse (!) 52   Ht 5\' 6"  (1.676 m)   Wt 172 lb 6.4 oz (78.2 kg)   SpO2 98%   BMI 27.83 kg/m  Wt Readings from Last 3 Encounters:  01/13/23 172 lb 6.4 oz (78.2 kg)  12/22/22 173 lb 12.8 oz (78.8 kg)  11/24/22 175 lb (79.4 kg)        Assessment & Plan:   Problem List Items Addressed This Visit       Respiratory   Acute bronchitis - Primary    Has cough -completed oral azithromycin and Medrol Dosepak Wheezing at home resolved now Started Prednisone 20 mg QD X 5 days Cheratussin as needed for cough Continue albuterol as needed for dyspnea or wheezing Mucinex or Robitussin as needed for cough      Relevant Medications   predniSONE (DELTASONE) 20 MG tablet   guaiFENesin-codeine (CHERATUSSIN AC) 100-10 MG/5ML syrup     Meds ordered this encounter  Medications   predniSONE (DELTASONE) 20 MG tablet    Sig: Take 1 tablet (20 mg total) by mouth daily with breakfast.    Dispense:  5 tablet    Refill:  0   guaiFENesin-codeine (CHERATUSSIN AC) 100-10 MG/5ML syrup    Sig: Take 5 mLs by mouth 3 (three) times daily as needed for cough.    Dispense:  120 mL    Refill:  0     Wonda Goodgame Concha Se, MD

## 2023-01-13 NOTE — Patient Instructions (Signed)
Please take Prednisone as prescribed.  Please take Cheratussin as needed for cough.  Please perform warm water gargling for sore throat. Okay to use Phenol throat spray for sore throat.

## 2023-01-13 NOTE — Assessment & Plan Note (Signed)
Has cough -completed oral azithromycin and Medrol Dosepak Wheezing at home resolved now Started Prednisone 20 mg QD X 5 days Cheratussin as needed for cough Continue albuterol as needed for dyspnea or wheezing Mucinex or Robitussin as needed for cough

## 2023-01-16 DIAGNOSIS — G4700 Insomnia, unspecified: Secondary | ICD-10-CM | POA: Diagnosis not present

## 2023-01-16 DIAGNOSIS — R110 Nausea: Secondary | ICD-10-CM | POA: Diagnosis not present

## 2023-01-18 ENCOUNTER — Ambulatory Visit
Admission: RE | Admit: 2023-01-18 | Discharge: 2023-01-18 | Disposition: A | Discharge status: Home / Self Care | Payer: Medicare Other | Source: Ambulatory Visit | Attending: Nephrology | Admitting: Nephrology

## 2023-01-18 DIAGNOSIS — I722 Aneurysm of renal artery: Secondary | ICD-10-CM

## 2023-01-18 DIAGNOSIS — N1832 Chronic kidney disease, stage 3b: Secondary | ICD-10-CM | POA: Diagnosis not present

## 2023-02-01 ENCOUNTER — Ambulatory Visit (INDEPENDENT_AMBULATORY_CARE_PROVIDER_SITE_OTHER): Payer: Medicare Other | Admitting: Internal Medicine

## 2023-02-01 ENCOUNTER — Encounter: Payer: Self-pay | Admitting: Internal Medicine

## 2023-02-01 VITALS — BP 126/67 | HR 52 | Ht 66.0 in | Wt 175.2 lb

## 2023-02-01 DIAGNOSIS — J3089 Other allergic rhinitis: Secondary | ICD-10-CM

## 2023-02-01 DIAGNOSIS — R197 Diarrhea, unspecified: Secondary | ICD-10-CM | POA: Insufficient documentation

## 2023-02-01 MED ORDER — MONTELUKAST SODIUM 10 MG PO TABS
10.0000 mg | ORAL_TABLET | Freq: Every day | ORAL | 3 refills | Status: DC
Start: 2023-02-01 — End: 2023-07-06

## 2023-02-01 NOTE — Assessment & Plan Note (Signed)
Added Singulair. She has tried Claritin, Engineer, production

## 2023-02-01 NOTE — Assessment & Plan Note (Addendum)
She has loose BM to watery diarrhea with mucus Check GI stool profile If persistent, will refer to GI Reglan as needed for nausea Advised to maintain adequate hydration

## 2023-02-01 NOTE — Patient Instructions (Signed)
Please take Reglan as needed for nausea.  Please take Pepcid as needed in the evening of acid reflux/heartburn.  Please get stool test done in the next 2 days.  Please start taking Singulair as prescribed for allergies. Okay to continue Allegra or Xyzal.

## 2023-02-01 NOTE — Progress Notes (Signed)
Acute Office Visit  Subjective:    Patient ID: Jaclyn Brooks, female    DOB: 09/10/1961, 62 y.o.   MRN: [id number]  Chief Complaint  Patient presents with   Diarrhea    Patient is having off and on diarrhea and feeling nauseous.     HPI Patient is in today for complaint of diarrhea for the last 2 weeks.  She is having watery diarrhea and loose BM at times with yellowish mucus.  Denies any melena or hematochezia currently.  She also reports nausea.  She has had intermittent diarrhea 2 months ago as well.  She has tried taking Reglan for nausea.  Denies any fever or chills.  She has abdominal pain/cramping at times.  Denies dysuria or hematuria currently.  She also complains of intermittent nasal congestion, postnasal drip and dry cough.  She has tried taking Xyzal without much relief.  She has tried Insurance underwriter as well.  Denies any dyspnea or wheezing currently.  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Past Surgical History:  Procedure Laterality Date   CHOLECYSTECTOMY N/A 01/28/2015   Procedure: LAPAROSCOPIC CHOLECYSTECTOMY;  Surgeon: Franky Macho Md, MD;  Location: [facility];  Service: General;  Laterality: N/A;   excision of bone spurs Bilateral    Big toes   renal aneurysm Right    right coil-and only has function of left kidney   TOTAL ABDOMINAL HYSTERECTOMY     X-STOP IMPLANTATION      Family History  Problem Relation Age of Onset   Hypertension Mother    Heart disease Mother        Cardiac Arrest    Hypertension Father    Cancer Father    Hypertension Sister    Cancer Brother        Bladder Cancer    Hypertension Brother    Hypertension Sister     Social History   Socioeconomic History   Marital status: Unknown    Spouse name: Not on file   Number of children: 1    Years of education: 12   Highest education level: Some college, no degree  Occupational History   Occupation: Retired  Tobacco Use   Smoking status: Former    Packs/day: 0.25    Years: 20.00    Additional pack years: 0.00    Total pack years: 5.00    Types: Cigarettes    Quit date: 2019    Years since quitting: 5.3   Smokeless tobacco: Never  Vaping Use   Vaping Use: Never used  Substance and Sexual Activity   Alcohol use: Not Currently    Comment: occasionally   Drug use: No   Sexual activity: Yes    Birth control/protection: Surgical  Other Topics Concern   Not on file  Social History Narrative   Not on file   Social Determinants of Health   Financial Resource Strain: Low Risk  (10/19/2021)   Overall Financial Resource Strain (CARDIA)    Difficulty of Paying Living Expenses: Not hard at all  Food Insecurity: No Food Insecurity (10/19/2021)   Hunger Vital Sign    Worried About Running Out of Food in the Last Year: Never true    Ran Out of Food  in the Last Year: Never true  Transportation Needs: No Transportation Needs (10/19/2021)   PRAPARE - Administrator, Civil Service (Medical): No    Lack of Transportation (Non-Medical): No  Physical Activity: Sufficiently Active (10/19/2021)   Exercise Vital Sign    Days of Exercise per Week: 5 days    Minutes of Exercise per Session: 30 min  Stress: No Stress Concern Present (10/19/2021)   [employer] of Occupational Health - Occupational Stress Questionnaire    Feeling of Stress : Not at all  Social Connections: Moderately Isolated (10/19/2021)   Social Connection and Isolation Panel [NHANES]    Frequency of Communication with Friends and Family: More than three times a week    Frequency of Social Gatherings with Friends and Family: More than three times a week    Attends Religious Services: More than 4 times per year    Active Member of [employer] or Organizations: No    Attends Banker Meetings: Never     Marital Status: Never married  Intimate Partner Violence: Not At Risk (10/19/2021)   Humiliation, Afraid, Rape, and Kick questionnaire    Fear of Current or Ex-Partner: No    Emotionally Abused: No    Physically Abused: No    Sexually Abused: No    Outpatient Medications Prior to Visit  Medication Sig Dispense Refill   albuterol (VENTOLIN HFA) 108 (90 Base) MCG/ACT inhaler Inhale 1-2 puffs into the lungs every 6 (six) hours as needed for wheezing or shortness of breath. 1 each 2   allopurinol (ZYLOPRIM) 100 MG tablet Take 2 tablets by mouth once daily 60 tablet 0   atenolol (TENORMIN) 50 MG tablet Take 1 tablet by mouth once daily 90 tablet 0   budesonide-formoterol (SYMBICORT) 80-4.5 MCG/ACT inhaler Inhale 2 puffs into the lungs 2 (two) times daily. 1 each 2   busPIRone (BUSPAR) 5 MG tablet Take 1 tablet (5 mg total) by mouth 2 (two) times daily. 60 tablet 3   butalbital-acetaminophen-caffeine (FIORICET) 50-325-40 MG tablet Take 1 tablet by mouth every 12 (twelve) hours as needed for headache or migraine. 20 tablet 0   CARTIA XT 240 MG 24 hr capsule Take 1 capsule (240 mg total) by mouth daily. 90 capsule 3   clopidogrel (PLAVIX) 75 MG tablet Take 1 tablet by mouth in the evening 90 tablet 0   dexlansoprazole (DEXILANT) 60 MG capsule Take 1 capsule (60 mg total) by mouth daily. 30 capsule 2   guaiFENesin-codeine (CHERATUSSIN AC) 100-10 MG/5ML syrup Take 5 mLs by mouth 3 (three) times daily as needed for cough. 120 mL 0   hydrochlorothiazide (HYDRODIURIL) 12.5 MG tablet Take 12.5 mg by mouth daily.     hydrocortisone (ANUSOL-HC) 2.5 % rectal cream Place 1 application rectally 2 (two) times daily. 30 g 0   methocarbamol (ROBAXIN) 500 MG tablet Take 1 tablet (500 mg total) by mouth 2 (two) times daily. 30 tablet 1   metoCLOPramide (REGLAN) 5 MG tablet Take 1 tablet (5 mg total) by mouth every 8 (eight) hours as needed for nausea. 20 tablet 1   oxyCODONE-acetaminophen (PERCOCET/ROXICET)  5-325 MG tablet Take 1 tablet by mouth every 6 (six) hours as needed for severe pain. 6 tablet 0   triamcinolone cream (KENALOG) 0.1 % Apply 1 Application topically 2 (two) times daily. 30 g 0   predniSONE (DELTASONE) 20 MG tablet Take 1 tablet (20 mg total) by mouth daily with breakfast. 5 tablet 0   No facility-administered medications  prior to visit.    Allergies  Allergen Reactions   Bee Venom Hives   Nsaids     Kidney disease    Shellfish Allergy Itching    Review of Systems  Constitutional:  Negative for chills and fever.  HENT:  Positive for congestion, postnasal drip and sore throat. Negative for sinus pressure and sinus pain.   Eyes:  Negative for pain and discharge.  Respiratory:  Positive for cough. Negative for shortness of breath and wheezing.   Cardiovascular:  Negative for chest pain and palpitations.  Gastrointestinal:  Positive for diarrhea and nausea. Negative for abdominal pain and vomiting.  Endocrine: Negative for polydipsia and polyuria.  Genitourinary:  Negative for dysuria and hematuria.  Musculoskeletal:  Positive for arthralgias, back pain and neck pain. Negative for neck stiffness.  Skin:  Negative for rash.  Neurological:  Positive for headaches. Negative for dizziness and weakness.  Psychiatric/Behavioral:  Positive for agitation. Negative for behavioral problems. The patient is nervous/anxious.        Objective:    Physical Exam Vitals reviewed.  Constitutional:      General: She is not in acute distress.    Appearance: She is not diaphoretic.  HENT:     Head: Normocephalic and atraumatic.     Mouth/Throat:     Mouth: Mucous membranes are moist.  Eyes:     General: No scleral icterus.    Extraocular Movements: Extraocular movements intact.  Cardiovascular:     Rate and Rhythm: Normal rate and regular rhythm.     Pulses: Normal pulses.     Heart sounds: Normal heart sounds. No murmur heard. Pulmonary:     Breath sounds: Normal breath  sounds. No wheezing or rales.  Abdominal:     General: Bowel sounds are normal.     Palpations: Abdomen is soft.     Tenderness: There is abdominal tenderness (Mild, epigastric). There is no guarding or rebound.  Musculoskeletal:     Cervical back: Neck supple. No tenderness.     Right lower leg: No edema.     Left lower leg: No edema.  Skin:    General: Skin is warm.     Findings: Lesion (Moles over face area) present. No rash.     Comments: Inclusion cyst over left shoulder  Neurological:     General: No focal deficit present.     Mental Status: She is alert and oriented to person, place, and time.     Sensory: No sensory deficit.     Motor: No weakness.  Psychiatric:        Mood and Affect: Mood normal.        Behavior: Behavior normal.     BP 126/67 (BP Location: Left Arm, Patient Position: Sitting, Cuff Size: Normal)   Pulse (!) 52   Ht  (1.676 m)   Wt 175 lb 3.2 oz (79.5 kg)   SpO2 96%   BMI 28.28 kg/m  Wt Readings from Last 3 Encounters:  02/01/23 175 lb 3.2 oz (79.5 kg)  01/13/23 172 lb 6.4 oz (78.2 kg)  12/22/22 173 lb 12.8 oz (78.8 kg)        Assessment & Plan:   Problem List Items Addressed This Visit       Respiratory   Allergic rhinitis    Added Singulair. She has tried Claritin, Engineer, production      Relevant Medications   montelukast (SINGULAIR) 10 MG tablet     Digestive  Diarrhea of presumed infectious origin - Primary    She has loose BM to watery diarrhea with mucus Check GI stool profile If persistent, will refer to GI Reglan as needed for nausea Advised to maintain adequate hydration      Relevant Orders   GI Profile, Stool, PCR     Meds ordered this encounter  Medications   montelukast (SINGULAIR) 10 MG tablet    Sig: Take 1 tablet (10 mg total) by mouth at bedtime.    Dispense:  30 tablet    Refill:  3     Yama Nielson Concha Se, MD

## 2023-02-06 ENCOUNTER — Ambulatory Visit (INDEPENDENT_AMBULATORY_CARE_PROVIDER_SITE_OTHER): Payer: Medicare Other | Admitting: Internal Medicine

## 2023-02-06 ENCOUNTER — Encounter: Payer: Self-pay | Admitting: Internal Medicine

## 2023-02-06 DIAGNOSIS — Z0000 Encounter for general adult medical examination without abnormal findings: Secondary | ICD-10-CM

## 2023-02-06 NOTE — Progress Notes (Signed)
Subjective:  This is a telephone encounter between Jaclyn Brooks and Jaclyn Brooks on 02/06/2023 for AWV. The visit was conducted with the patient located at home and Jaclyn Brooks at [facility]. The patient's identity was confirmed using their DOB and current address. The patient has consented to being evaluated through a telephone encounter and understands the associated risks (an examination cannot be done and the patient may need to come in for an appointment) / benefits (allows the patient to remain at home, decreasing exposure to coronavirus).     Jaclyn Brooks is a 62 y.o. female who presents for Medicare Annual (Subsequent) preventive examination.  Review of Systems    Review of Systems  All other systems reviewed and are negative.      Objective:    There were no vitals filed for this visit. There is no height or weight on file to calculate BMI.     02/06/2023   10:08 AM 05/19/2022   10:55 PM 10/19/2021    1:50 PM 06/24/2021   11:31 AM 03/01/2021   10:37 AM 08/28/2020   11:04 AM 01/24/2020    8:57 AM  Advanced Directives  Does Patient Have a Medical Advance Directive? No No No No No No No  Would patient like information on creating a medical advance directive? Yes (ED - Information included in AVS) No - Patient declined Yes (ED - Information included in AVS)   No - Patient declined No - Patient declined    Current Medications (verified) Outpatient Encounter Medications as of 02/06/2023  Medication Sig   albuterol (VENTOLIN HFA) 108 (90 Base) MCG/ACT inhaler Inhale 1-2 puffs into the lungs every 6 (six) hours as needed for wheezing or shortness of breath.   allopurinol (ZYLOPRIM) 100 MG tablet Take 2 tablets by mouth once daily   atenolol (TENORMIN) 50 MG tablet Take 1 tablet by mouth once daily   budesonide-formoterol (SYMBICORT) 80-4.5 MCG/ACT inhaler Inhale 2 puffs into the lungs 2 (two) times daily.   busPIRone (BUSPAR) 5 MG tablet Take 1 tablet (5 mg total) by mouth 2 (two)  times daily.   butalbital-acetaminophen-caffeine (FIORICET) 50-325-40 MG tablet Take 1 tablet by mouth every 12 (twelve) hours as needed for headache or migraine.   CARTIA XT 240 MG 24 hr capsule Take 1 capsule (240 mg total) by mouth daily.   clopidogrel (PLAVIX) 75 MG tablet Take 1 tablet by mouth in the evening   dexlansoprazole (DEXILANT) 60 MG capsule Take 1 capsule (60 mg total) by mouth daily.   guaiFENesin-codeine (CHERATUSSIN AC) 100-10 MG/5ML syrup Take 5 mLs by mouth 3 (three) times daily as needed for cough.   hydrochlorothiazide (HYDRODIURIL) 12.5 MG tablet Take 12.5 mg by mouth daily.   metoCLOPramide (REGLAN) 5 MG tablet Take 1 tablet (5 mg total) by mouth every 8 (eight) hours as needed for nausea.   montelukast (SINGULAIR) 10 MG tablet Take 1 tablet (10 mg total) by mouth at bedtime.   [DISCONTINUED] atorvastatin (LIPITOR) 20 MG tablet Take 1 tablet (20 mg total) by mouth at bedtime.   [DISCONTINUED] hydrocortisone (ANUSOL-HC) 2.5 % rectal cream Place 1 application rectally 2 (two) times daily.   [DISCONTINUED] methocarbamol (ROBAXIN) 500 MG tablet Take 1 tablet (500 mg total) by mouth 2 (two) times daily.   [DISCONTINUED] oxyCODONE-acetaminophen (PERCOCET/ROXICET) 5-325 MG tablet Take 1 tablet by mouth every 6 (six) hours as needed for severe pain.   [DISCONTINUED] triamcinolone cream (KENALOG) 0.1 % Apply 1 Application topically 2 (two) times daily.  No facility-administered encounter medications on file as of 02/06/2023.    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[facility])    Past Surgical History:  Procedure Laterality Date   CHOLECYSTECTOMY N/A 01/28/2015   Procedure: LAPAROSCOPIC CHOLECYSTECTOMY;  Surgeon: Franky Macho  Md, MD;  Location: [facility];  Service: General;  Laterality: N/A;   excision of bone spurs Bilateral    Big toes   renal aneurysm Right    right coil-and only has function of left kidney   TOTAL ABDOMINAL HYSTERECTOMY     X-STOP IMPLANTATION     Family History  Problem Relation Age of Onset   Hypertension Mother    Heart disease Mother        Cardiac Arrest    Hypertension Father    Cancer Father    Hypertension Sister    Cancer Brother        Bladder Cancer    Hypertension Brother    Hypertension Sister    Social History   Socioeconomic History   Marital status: Unknown    Spouse name: Not on file   Number of children: 1   Years of education: 12   Highest education level: Some college, no degree  Occupational History   Occupation: Retired  Tobacco Use   Smoking status: Former    Packs/day: 0.25    Years: 20.00    Additional pack years: 0.00    Total pack years: 5.00    Types: Cigarettes    Quit date: 2019    Years since quitting: 5.3   Smokeless tobacco: Never  Vaping Use   Vaping Use: Never used  Substance and Sexual Activity   Alcohol use: Not Currently    Comment: occasionally   Drug use: No   Sexual activity: Yes    Birth control/protection: Surgical  Other Topics Concern   Not on file  Social History Narrative   Not on file   Social Determinants of Health   Financial Resource Strain: Low Risk  (10/19/2021)   Overall Financial Resource Strain (CARDIA)    Difficulty of Paying Living Expenses: Not hard at all  Food Insecurity: No Food Insecurity (10/19/2021)   Hunger Vital Sign    Worried About Running Out of Food in the Last Year: Never true    Ran Out of Food in the Last Year: Never true  Transportation Needs: No Transportation Needs (10/19/2021)   PRAPARE - Administrator, Civil Service (Medical): No    Lack of Transportation (Non-Medical): No  Physical Activity: Sufficiently Active (10/19/2021)   Exercise Vital Sign    Days of Exercise  per Week: 5 days    Minutes of Exercise per Session: 30 min  Stress: No Stress Concern Present (10/19/2021)   [employer] of Occupational Health - Occupational Stress Questionnaire    Feeling of Stress : Not at all  Social Connections: Moderately Isolated (10/19/2021)   Social Connection and Isolation Panel [NHANES]    Frequency of Communication with Friends and Family: More than three times a week    Frequency of Social Gatherings with Friends and Family: More than three times a week    Attends Religious Services: More than  4 times per year    Active Member of Clubs or Organizations: No    Attends Banker Meetings: Never    Marital Status: Never married    Tobacco Counseling Counseling given: Not Answered   Clinical Intake:  Pre-visit preparation completed: Yes  Pain : No/denies pain     Diabetes: No  How often do you need to have someone help you when you read instructions, pamphlets, or other written materials from your doctor or pharmacy?: 1 - Never What is the last grade level you completed in school?: 12th grade + extra training    Activities of Daily Living    02/06/2023   10:09 AM  In your present state of health, do you have any difficulty performing the following activities:  Hearing? 0  Vision? 0  Difficulty concentrating or making decisions? 0  Walking or climbing stairs? 0  Dressing or bathing? 0  Doing errands, shopping? 0    Patient Care Team: Anabel Halon, MD as PCP - General (Internal Medicine)  Indicate any recent Medical Services you may have received from other than [facility] providers in the past year (date may be approximate).     Assessment:   This is a routine wellness examination for Shakeisha.  Hearing/Vision screen No results found.  Dietary issues and exercise activities discussed:     Goals Addressed   None    Depression Screen    02/06/2023   10:09 AM 02/01/2023    8:30 AM 01/13/2023   11:17 AM 12/22/2022     8:55 AM 11/24/2022    9:06 AM 10/06/2022    1:21 PM 09/12/2022   11:25 AM  PHQ 2/9 Scores  PHQ - 2 Score 0 2 1 2 2  0 1  PHQ- 9 Score  6 3 4 6  0 5    Fall Risk    02/06/2023   10:09 AM 02/01/2023    8:30 AM 01/13/2023   11:17 AM 12/22/2022    8:55 AM 11/24/2022    9:06 AM  Fall Risk   Falls in the past year? 0 0 0 0 0  Number falls in past yr:  0 0 0 0  Injury with Fall?  0 0 0 0    FALL RISK PREVENTION PERTAINING TO THE HOME:  Any stairs in or around the home? Yes  If so, are there any without handrails? Yes  Home free of loose throw rugs in walkways, pet beds, electrical cords, etc? Yes  Adequate lighting in your home to reduce risk of falls? Yes   ASSISTIVE DEVICES UTILIZED TO PREVENT FALLS:  Life alert? No  Use of a cane, walker or w/c? No  Grab bars in the bathroom? Yes  Shower chair or bench in shower? No  Elevated toilet seat or a handicapped toilet? No    Cognitive Function:        02/06/2023   10:09 AM 10/19/2021    1:52 PM  6CIT Screen  What Year? 0 points 0 points  What month? 0 points 0 points  What time? 0 points 0 points  Count back from 20 0 points 0 points  Months in reverse 0 points 0 points  Repeat phrase 0 points 0 points  Total Score 0 points 0 points    Immunizations Immunization History  Administered Date(s) Administered   Moderna Sars-Covid-2 Vaccination 12/27/2019, 01/28/2020, 09/13/2020    TDAP status: Due, Education has been provided regarding the importance of this vaccine. Advised may receive this  vaccine at local pharmacy or Health Dept. Aware to provide a copy of the vaccination record if obtained from local pharmacy or Health Dept. Verbalized acceptance and understanding.  Flu Vaccine status: Declined, Education has been provided regarding the importance of this vaccine but patient still declined. Advised may receive this vaccine at local pharmacy or Health Dept. Aware to provide a copy of the vaccination record if obtained from local  pharmacy or Health Dept. Verbalized acceptance and understanding.   Covid-19 vaccine status: Information provided on how to obtain vaccines.   Qualifies for Shingles Vaccine? Yes   Zostavax completed No   Shingrix Completed?: No.    Education has been provided regarding the importance of this vaccine. Patient has been advised to call insurance company to determine out of pocket expense if they have not yet received this vaccine. Advised may also receive vaccine at local pharmacy or Health Dept. Verbalized acceptance and understanding.  Screening Tests Health Maintenance  Topic Date Due   DTaP/Tdap/Td (1 - Tdap) Never done   Zoster Vaccines- Shingrix (1 of 2) Never done   Diabetic kidney evaluation - eGFR measurement  03/04/2022   OPHTHALMOLOGY EXAM  04/20/2022   Medicare Annual Wellness (AWV)  10/19/2022   FOOT EXAM  11/30/2022   HEMOGLOBIN A1C  01/12/2023   COLONOSCOPY (Pts 45-44yrs Insurance coverage will need to be confirmed)  09/13/2023 (Originally 04/26/2004)   INFLUENZA VACCINE  05/11/2023   Diabetic kidney evaluation - Urine ACR  07/14/2023   MAMMOGRAM  12/05/2024   Hepatitis C Screening  Completed   HIV Screening  Completed   HPV VACCINES  Aged Out   COVID-19 Vaccine  Discontinued    Health Maintenance  Health Maintenance Due  Topic Date Due   DTaP/Tdap/Td (1 - Tdap) Never done   Zoster Vaccines- Shingrix (1 of 2) Never done   Diabetic kidney evaluation - eGFR measurement  03/04/2022   OPHTHALMOLOGY EXAM  04/20/2022   Medicare Annual Wellness (AWV)  10/19/2022   FOOT EXAM  11/30/2022   HEMOGLOBIN A1C  01/12/2023    Colorectal cancer screening: Type of screening: Cologuard. She needs to turn in  Mammogram status: Ordered 12/08/2022.   Lung Cancer Screening: (Low Dose CT Chest recommended if Age 39-80 years, 30 pack-year currently smoking OR have quit w/in 15years.) does not qualify.   Additional Screening:  Hepatitis C Screening: does not qualify; Completed  08/09/2019  Vision Screening: Recommended annual ophthalmology exams for early detection of glaucoma and other disorders of the eye. Is the patient up to date with their annual eye exam?  Yes  Who is the provider or what is the name of the office in which the patient attends annual eye exams? [facility] If pt is not established with a provider, would they like to be referred to a provider to establish care? No .   Dental Screening: Recommended annual dental exams for proper oral hygiene  Community Resource Referral / Chronic Care Management: CRR required this visit?  No   CCM required this visit?  No      Plan:     I have personally reviewed and noted the following in the patient's chart:   Medical and social history Use of alcohol, tobacco or illicit drugs  Current medications and supplements including opioid prescriptions. Patient is not currently taking opioid prescriptions. Functional ability and status Nutritional status Physical activity Advanced directives List of other physicians Hospitalizations, surgeries, and ER visits in previous 12 months Vitals Screenings to include cognitive, depression,  and falls Referrals and appointments  In addition, I have reviewed and discussed with patient certain preventive protocols, quality metrics, and best practice recommendations. A written personalized care plan for preventive services as well as general preventive health recommendations were provided to patient.     Jaclyn Banister, MD   02/06/2023

## 2023-02-06 NOTE — Patient Instructions (Addendum)
  Ms. Arista , Thank you for taking time to come for your Medicare Wellness Visit. I appreciate your ongoing commitment to your health goals. Please review the following plan we discussed and let me know if I can assist you in the future.   These are the goals we discussed:  Goals      Food choices     Patient would like to make good food choices.         This is a list of the screening recommended for you and due dates:  Health Maintenance  Topic Date Due   DTaP/Tdap/Td vaccine (1 - Tdap) Never done   Zoster (Shingles) Vaccine (1 of 2) Never done   Yearly kidney function blood test for diabetes  03/04/2022   Eye exam for diabetics  04/20/2022   Medicare Annual Wellness Visit  10/19/2022   Complete foot exam   11/30/2022   Hemoglobin A1C  01/12/2023   Colon Cancer Screening  09/13/2023*   Flu Shot  05/11/2023   Yearly kidney health urinalysis for diabetes  07/14/2023   Mammogram  12/05/2024   Hepatitis C Screening: USPSTF Recommendation to screen - Ages 18-79 yo.  Completed   HIV Screening  Completed   HPV Vaccine  Aged Out   COVID-19 Vaccine  Discontinued  *Topic was postponed. The date shown is not the original due date.

## 2023-02-07 DIAGNOSIS — R110 Nausea: Secondary | ICD-10-CM | POA: Diagnosis not present

## 2023-02-09 ENCOUNTER — Other Ambulatory Visit: Payer: Self-pay | Admitting: Internal Medicine

## 2023-02-09 DIAGNOSIS — M1A0690 Idiopathic chronic gout, unspecified knee, without tophus (tophi): Secondary | ICD-10-CM

## 2023-02-21 ENCOUNTER — Other Ambulatory Visit: Payer: Self-pay | Admitting: Internal Medicine

## 2023-02-21 DIAGNOSIS — I10 Essential (primary) hypertension: Secondary | ICD-10-CM

## 2023-03-01 ENCOUNTER — Encounter: Payer: Self-pay | Admitting: Vascular Surgery

## 2023-03-01 ENCOUNTER — Ambulatory Visit (INDEPENDENT_AMBULATORY_CARE_PROVIDER_SITE_OTHER): Payer: Medicare Other | Admitting: Vascular Surgery

## 2023-03-01 ENCOUNTER — Other Ambulatory Visit: Payer: Self-pay | Admitting: Internal Medicine

## 2023-03-01 VITALS — BP 159/83 | HR 48 | Temp 98.3°F | Resp 20 | Ht 66.0 in | Wt 175.0 lb

## 2023-03-01 DIAGNOSIS — I701 Atherosclerosis of renal artery: Secondary | ICD-10-CM

## 2023-03-01 DIAGNOSIS — I6523 Occlusion and stenosis of bilateral carotid arteries: Secondary | ICD-10-CM

## 2023-03-01 NOTE — Progress Notes (Signed)
Patient ID: [name], female   DOB: May 25, 1961, 62 y.o.   MRN: [id number]  Reason for Consult: New Patient (Initial Visit)   Referred by Estanislado Emms, MD  Subjective:     HPI:  Jaclyn Brooks is a 62 y.o. 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  She was evaluated here 3 years ago for the above-noted problems.  She states that she feels very healthy that her kidneys are working well and that her blood pressure has been somewhat high on 2 medications.  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  She does take Plavix and is a former smoker quit 7 years ago.  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HCC)    Family History  Problem Relation Age of Onset   Hypertension Mother    Heart disease Mother        Cardiac Arrest    Hypertension Father    Cancer Father    Hypertension Sister    Cancer Brother        Bladder Cancer    Hypertension Brother    Hypertension Sister    Past Surgical History:  Procedure Laterality Date   CHOLECYSTECTOMY N/A 01/28/2015   Procedure: LAPAROSCOPIC CHOLECYSTECTOMY;  Surgeon: Franky Macho Md, MD;  Location: [facility];  Service: General;  Laterality: N/A;   excision of bone spurs Bilateral    Big toes   renal aneurysm Right    right coil-and only has function of left kidney   TOTAL ABDOMINAL HYSTERECTOMY     X-STOP IMPLANTATION      Short Social History:  Social History   Tobacco Use   Smoking status: Former    Packs/day: 0.25    Years: 20.00    Additional pack years: 0.00    Total pack years: 5.00    Types: Cigarettes    Quit date: 2019    Years since quitting: 5.3   Smokeless tobacco: Never   Substance Use Topics   Alcohol use: Not Currently    Comment: occasionally    Allergies  Allergen Reactions   Bee Venom Hives   Nsaids     Kidney disease    Shellfish Allergy Itching    Current Outpatient Medications  Medication Sig Dispense Refill   albuterol (VENTOLIN HFA) 108 (90 Base) MCG/ACT inhaler Inhale 1-2 puffs into the lungs every 6 (six) hours as needed for wheezing or shortness of breath. 1 each 2   allopurinol (ZYLOPRIM) 100 MG tablet Take 2 tablets by mouth once daily 60 tablet 0   atenolol (TENORMIN) 50 MG tablet Take 1 tablet by mouth once daily 90 tablet 0   budesonide-formoterol (SYMBICORT) 80-4.5 MCG/ACT inhaler Inhale 2 puffs into the lungs 2 (two) times daily. 1 each 2   busPIRone (BUSPAR) 5 MG tablet Take 1 tablet (5 mg total) by mouth 2 (two) times daily. 60 tablet 3   butalbital-acetaminophen-caffeine (FIORICET) 50-325-40 MG tablet Take 1 tablet by mouth every 12 (twelve) hours as needed for headache or migraine. 20 tablet 0   CARTIA XT 240 MG 24 hr capsule Take 1 capsule (  240 mg total) by mouth daily. 90 capsule 3   clopidogrel (PLAVIX) 75 MG tablet Take 1 tablet by mouth in the evening 90 tablet 0   dexlansoprazole (DEXILANT) 60 MG capsule Take 1 capsule (60 mg total) by mouth daily. 30 capsule 2   guaiFENesin-codeine (CHERATUSSIN AC) 100-10 MG/5ML syrup Take 5 mLs by mouth 3 (three) times daily as needed for cough. 120 mL 0   hydrochlorothiazide (HYDRODIURIL) 12.5 MG tablet Take 12.5 mg by mouth daily.     metoCLOPramide (REGLAN) 5 MG tablet Take 1 tablet (5 mg total) by mouth every 8 (eight) hours as needed for nausea. 20 tablet 1   montelukast (SINGULAIR) 10 MG tablet Take 1 tablet (10 mg total) by mouth at bedtime. 30 tablet 3   No current facility-administered medications for this visit.    Review of Systems  Constitutional:  Constitutional negative. HENT: HENT negative.  Eyes: Eyes negative.  Respiratory: Respiratory negative.  Cardiovascular:  Cardiovascular negative.  GI: Gastrointestinal negative.  Musculoskeletal: Musculoskeletal negative.  Skin: Skin negative.  Neurological: Neurological negative. Hematologic: Hematologic/lymphatic negative.  Psychiatric: Psychiatric negative.        Objective:  Objective   Vitals:   03/01/23 1100  BP: (!) 159/83  Pulse: (!) 48  Resp: 20  Temp: 98.3 F (36.8 C)  SpO2: 95%  Weight: 175 lb (79.4 kg)  Height: 5\' 6"  (1.676 m)   Body mass index is 28.25 kg/m.  Physical Exam HENT:     Head: Normocephalic.     Nose: Nose normal.  Eyes:     Pupils: Pupils are equal, round, and reactive to light.  Neck:     Vascular: No carotid bruit.  Cardiovascular:     Rate and Rhythm: Normal rate.     Pulses:          Dorsalis pedis pulses are 2+ on the right side and 2+ on the left side.  Pulmonary:     Effort: Pulmonary effort is normal.  Abdominal:     General: Abdomen is flat.     Palpations: Abdomen is soft.  Musculoskeletal:        General: Normal range of motion.     Right lower leg: No edema.     Left lower leg: No edema.  Skin:    General: Skin is warm.     Capillary Refill: Capillary refill takes less than 2 seconds.  Neurological:     General: No focal deficit present.     Mental Status: She is alert.  Psychiatric:        Mood and Affect: Mood normal.        Thought Content: Thought content normal.        Judgment: Judgment normal.     Data: Renal artery duplex IMPRESSION: 1. Atrophic right kidney.  Right renal artery was not visualized. 2. Mildly elevated peak systolic velocities in the mid left renal artery, suspicious for stenosis. 3. Normal sonographic appearance of the left kidney. 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 Consider MRA for better visualization of the left renal artery pseudoaneurysm.       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  Patient with minimally elevated blood pressure today but mostly controlled with medications.  As such I will see  her back in 1 year with repeat renal artery duplex as well as carotid duplex with known carotid artery occlusion.  All questions answered today.     Maeola Harman MD [facility]

## 2023-03-05 ENCOUNTER — Encounter: Payer: Self-pay | Admitting: Emergency Medicine

## 2023-03-05 ENCOUNTER — Ambulatory Visit
Admission: EM | Admit: 2023-03-05 | Discharge: 2023-03-05 | Disposition: A | Discharge status: Home / Self Care | Payer: Medicare Other | Attending: Nurse Practitioner | Admitting: Nurse Practitioner

## 2023-03-05 DIAGNOSIS — J020 Streptococcal pharyngitis: Secondary | ICD-10-CM

## 2023-03-05 LAB — POCT RAPID STREP A (OFFICE): Rapid Strep A Screen: POSITIVE — AB

## 2023-03-05 MED ORDER — AMOXICILLIN 500 MG PO CAPS: 500.0000 mg | ORAL_CAPSULE | Freq: Two times a day (BID) | ORAL | 0 refills | Status: AC

## 2023-03-05 NOTE — Discharge Instructions (Addendum)
Take medication as prescribed. Increase fluids and allow for plenty of rest. Recommend over-the-counter Tylenol or Ibuprofen as needed for pain, fever, or general discomfort. Warm salt water gargles 3-4 times daily to help with throat pain or discomfort. Recommend a diet with soft foods to include soups, broths, puddings, yogurt, Jell-O's, soft meat or soft vegetables until symptoms improve. Change toothbrush after 3 days. Follow-up in this clinic or with your primary care physician if symptoms fail to improve. Follow-up as needed.

## 2023-03-05 NOTE — ED Provider Notes (Signed)
[facility]    CSN: [id number] Arrival date & time: 03/05/23  1021      History   Chief Complaint No chief complaint on file.   HPI Jaclyn Brooks is a 62 y.o. female.   The history is provided by the patient.   The patient presents with a 2-day history of sore throat and head ache.  Patient also complains of nasal congestion.  Patient denies fever, chills, ear pain, ear drainage, cough, chest pain, abdominal pain, nausea, vomiting, or diarrhea.  Patient reports she has been taking Tylenol as needed for pain or discomfort.  She reports that she has been around someone at work who was complaining of throat symptoms.  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[facility])     Patient Active Problem List   Diagnosis Date Noted   Diarrhea of presumed infectious origin 02/01/2023   Acute bronchitis 12/22/2022   Intractable migraine with aura without status migrainosus 08/15/2022   Acute otitis externa of right ear 07/13/2022   Traumatic hematoma of right lower leg 07/13/2022   History of total abdominal hysterectomy 07/04/2022   GAD (generalized anxiety disorder) 04/28/2022   Caregiver stress 04/28/2022   Chronic left-sided low back pain without sciatica 04/13/2022   Contact dermatitis 04/13/2022   Primary osteoarthritis of right knee 03/09/2022   Refused influenza vaccine 12/02/2021   Encounter for general adult medical examination with abnormal findings 11/30/2021   Epidermal cyst 11/30/2021   Acute frontal sinusitis 09/21/2021   Allergic rhinitis 08/29/2021   Nausea 08/27/2021   Hemorrhoids 07/07/2021   Gout 03/25/2021   Diabetes mellitus without complication (HCC) 07/14/2020   Primary osteoarthritis of right foot 05/12/2020   GERD (gastroesophageal reflux disease) 05/31/2019   Solitary  kidney, acquired 05/10/2019   Carotid arterial disease (HCC) 12/11/2018   COPD, 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/01/2018   CKD (chronic kidney disease) stage 3, GFR 30-59 ml/min (HCC) 10/01/2018   Aortic valve sclerosis 10/01/2018   Hypertension 09/11/2018    Past Surgical History:  Procedure Laterality Date   CHOLECYSTECTOMY N/A 01/28/2015   Procedure: LAPAROSCOPIC CHOLECYSTECTOMY;  Surgeon: Franky Macho Md, MD;  Location: [facility];  Service: General;  Laterality: N/A;   excision of bone spurs Bilateral    Big toes   renal aneurysm Right    right coil-and only has function of left kidney   TOTAL ABDOMINAL HYSTERECTOMY     X-STOP IMPLANTATION      OB History     Gravida  1   Para  1   Term      Preterm  1   AB      Living         SAB      IAB      Ectopic      Multiple      Live Births               Home Medications    Prior to Admission medications   Medication Sig Start Date End Date Taking? Authorizing Provider  amoxicillin (AMOXIL) 500 MG capsule Take 1 capsule (500 mg total) by mouth 2 (two) times daily for 10 days. 03/05/23 03/15/23 Yes Keiara Sneeringer-Warren, Sadie Haber, NP  albuterol (VENTOLIN HFA) 108 (90 Base) MCG/ACT inhaler  Inhale 1-2 puffs into the lungs every 6 (six) hours as needed for wheezing or shortness of breath. 12/22/22   Anabel Halon, MD  allopurinol (ZYLOPRIM) 100 MG tablet Take 2 tablets by mouth once daily 02/09/23   Anabel Halon, MD  atenolol (TENORMIN) 50 MG tablet Take 1 tablet by mouth once daily 02/22/23   Anabel Halon, MD  budesonide-formoterol [facility]) 80-4.5 MCG/ACT inhaler Inhale 2 puffs into the lungs 2 (two) times daily. 10/21/21   Anabel Halon, MD  busPIRone (BUSPAR) 5 MG tablet Take 1 tablet (5 mg total) by mouth 2 (two) times daily. 07/20/22   Anabel Halon, MD  butalbital-acetaminophen-caffeine (FIORICET) 414 095 0599 MG tablet Take 1 tablet by mouth every 12 (twelve) hours as needed for headache or migraine. 08/15/22   Patel, Earlie Lou, MD   CARTIA XT 240 MG 24 hr capsule Take 1 capsule (240 mg total) by mouth daily. 11/24/22   Anabel Halon, MD  clopidogrel (PLAVIX) 75 MG tablet Take 1 tablet by mouth in the evening 03/02/23   Anabel Halon, MD  dexlansoprazole (DEXILANT) 60 MG capsule Take 1 capsule (60 mg total) by mouth daily. 11/24/22   Anabel Halon, MD  guaiFENesin-codeine (CHERATUSSIN AC) 100-10 MG/5ML syrup Take 5 mLs by mouth 3 (three) times daily as needed for cough. 01/13/23   Anabel Halon, MD  hydrochlorothiazide (HYDRODIURIL) 12.5 MG tablet Take 12.5 mg by mouth daily. 03/21/22   [provider]  metoCLOPramide (REGLAN) 5 MG tablet Take 1 tablet (5 mg total) by mouth every 8 (eight) hours as needed for nausea. 11/24/22   Anabel Halon, MD  montelukast (SINGULAIR) 10 MG tablet Take 1 tablet (10 mg total) by mouth at bedtime. 02/01/23   Anabel Halon, MD  atorvastatin (LIPITOR) 20 MG tablet Take 1 tablet (20 mg total) by mouth at bedtime. 11/25/19 02/18/20  Salley Scarlet, MD    Family History Family History  Problem Relation Age of Onset   Hypertension Mother    Heart disease Mother        Cardiac Arrest    Hypertension Father    Cancer Father    Hypertension Sister    Cancer Brother        Bladder Cancer    Hypertension Brother    Hypertension Sister     Social History Social History   Tobacco Use   Smoking status: Former    Packs/day: 0.25    Years: 20.00    Additional pack years: 0.00    Total pack years: 5.00    Types: Cigarettes    Quit date: 2019    Years since quitting: 5.4   Smokeless tobacco: Never  Vaping Use   Vaping Use: Never used  Substance Use Topics   Alcohol use: Not Currently    Comment: occasionally   Drug use: No     Allergies   Bee venom, Nsaids, and Shellfish allergy   Review of Systems Review of Systems Per HPI  Physical Exam Triage Vital Signs ED Triage Vitals  Enc Vitals Group     BP 03/05/23 1027 (!) 153/85     Pulse Rate 03/05/23 1027 (!)  54     Resp 03/05/23 1027 18     Temp 03/05/23 1027 98.2 F (36.8 C)     Temp Source 03/05/23 1027 Oral     SpO2 03/05/23 1027 96 %     Weight --      Height --  Head Circumference --      Peak Flow --      Pain Score 03/05/23 1028 7     Pain Loc --      Pain Edu? --      Excl. in GC? --    No data found.  Updated Vital Signs BP (!) 153/85 (BP Location: Right Arm)   Pulse (!) 54   Temp 98.2 F (36.8 C) (Oral)   Resp 18   SpO2 96%   Visual Acuity Right Eye Distance:   Left Eye Distance:   Bilateral Distance:    Right Eye Near:   Left Eye Near:    Bilateral Near:     Physical Exam Vitals and nursing note reviewed.  Constitutional:      General: She is not in acute distress.    Appearance: Normal appearance.  HENT:     Head: Normocephalic.     Right Ear: Tympanic membrane, ear canal and external ear normal.     Left Ear: Tympanic membrane, ear canal and external ear normal.     Nose: Congestion present.     Mouth/Throat:     Mouth: Mucous membranes are moist.     Pharynx: Posterior oropharyngeal erythema present.  Eyes:     Extraocular Movements: Extraocular movements intact.     Conjunctiva/sclera: Conjunctivae normal.     Pupils: Pupils are equal, round, and reactive to light.  Cardiovascular:     Rate and Rhythm: Regular rhythm. Bradycardia present.     Pulses: Normal pulses.     Heart sounds: Normal heart sounds.  Pulmonary:     Effort: Pulmonary effort is normal. No respiratory distress.     Breath sounds: No stridor. Rhonchi present. No wheezing or rales.  Abdominal:     General: Bowel sounds are normal.     Palpations: Abdomen is soft.     Tenderness: There is no abdominal tenderness.  Musculoskeletal:     Cervical back: Normal range of motion.  Lymphadenopathy:     Cervical: No cervical adenopathy.  Skin:    General: Skin is warm and dry.  Neurological:     General: No focal deficit present.     Mental Status: She is alert and oriented to  person, place, and time.  Psychiatric:        Mood and Affect: Mood normal.        Behavior: Behavior normal.      UC Treatments / Results  Labs (all labs ordered are listed, but only abnormal results are displayed) Labs Reviewed  POCT RAPID STREP A (OFFICE) - Abnormal; Notable for the following components:      Result Value   Rapid Strep A Screen Positive (*)    All other components within normal limits    EKG   Radiology No results found.  Procedures Procedures (including critical care time)  Medications Ordered in UC Medications - No data to display  Initial Impression / Assessment and Plan / UC Course  I have reviewed the triage vital signs and the nursing notes.  Pertinent labs & imaging results that were available during my care of the patient were reviewed by me and considered in my medical decision making (see chart for details).  The patient is well-appearing, she is in no acute distress, vital signs are stable.  Rapid strep test is positive.  Will treat with amoxicillin 500 mg twice daily for the next 10 days.  Supportive care recommendations were provided and discussed with the  patient to include increasing fluids, allowing for plenty of rest, and continuing over-the-counter analgesics for pain or discomfort.  Patient was advised to follow-up if symptoms do not improve.  Patient verbalizes understanding.  All questions were answered.  Patient stable for discharge.  Work note was provided.   Final Clinical Impressions(s) / UC Diagnoses   Final diagnoses:  Streptococcal sore throat     Discharge Instructions      Take medication as prescribed. Increase fluids and allow for plenty of rest. Recommend over-the-counter Tylenol or Ibuprofen as needed for pain, fever, or general discomfort. Warm salt water gargles 3-4 times daily to help with throat pain or discomfort. Recommend a diet with soft foods to include soups, broths, puddings, yogurt, Jell-O's, soft  meat or soft vegetables until symptoms improve. Change toothbrush after 3 days. Follow-up in this clinic or with your primary care physician if symptoms fail to improve. Follow-up as needed.     ED Prescriptions     Medication Sig Dispense Auth. Provider   amoxicillin (AMOXIL) 500 MG capsule Take 1 capsule (500 mg total) by mouth 2 (two) times daily for 10 days. 20 capsule Marelyn Rouser-Warren, Sadie Haber, NP      PDMP not reviewed this encounter.   Abran Cantor, NP 03/05/23 1102

## 2023-03-05 NOTE — ED Triage Notes (Signed)
Sore throat x 2 days and headache.

## 2023-03-08 ENCOUNTER — Telehealth: Payer: Self-pay

## 2023-03-08 NOTE — Telephone Encounter (Signed)
VOB submitted for Durolane, right knee.  

## 2023-03-09 ENCOUNTER — Other Ambulatory Visit: Payer: Self-pay

## 2023-03-09 DIAGNOSIS — I701 Atherosclerosis of renal artery: Secondary | ICD-10-CM

## 2023-03-09 DIAGNOSIS — I6523 Occlusion and stenosis of bilateral carotid arteries: Secondary | ICD-10-CM

## 2023-03-15 ENCOUNTER — Ambulatory Visit: Payer: Medicare Other | Admitting: Surgical

## 2023-03-15 ENCOUNTER — Other Ambulatory Visit (INDEPENDENT_AMBULATORY_CARE_PROVIDER_SITE_OTHER): Payer: Medicare Other

## 2023-03-15 DIAGNOSIS — M76821 Posterior tibial tendinitis, right leg: Secondary | ICD-10-CM | POA: Diagnosis not present

## 2023-03-15 DIAGNOSIS — M25561 Pain in right knee: Secondary | ICD-10-CM

## 2023-03-15 DIAGNOSIS — G8929 Other chronic pain: Secondary | ICD-10-CM

## 2023-03-15 DIAGNOSIS — M1711 Unilateral primary osteoarthritis, right knee: Secondary | ICD-10-CM

## 2023-03-15 DIAGNOSIS — M79671 Pain in right foot: Secondary | ICD-10-CM

## 2023-03-16 ENCOUNTER — Telehealth: Payer: Self-pay | Admitting: Internal Medicine

## 2023-03-16 ENCOUNTER — Encounter: Payer: Self-pay | Admitting: Surgical

## 2023-03-16 MED ORDER — LIDOCAINE HCL 1 % IJ SOLN
5.0000 mL | INTRAMUSCULAR | Status: AC | PRN
Start: 2023-03-15 — End: 2023-03-15
  Administered 2023-03-15: 5 mL

## 2023-03-16 MED ORDER — BUPIVACAINE HCL 0.25 % IJ SOLN
4.0000 mL | INTRAMUSCULAR | Status: AC | PRN
Start: 2023-03-15 — End: 2023-03-15
  Administered 2023-03-15: 4 mL via INTRA_ARTICULAR

## 2023-03-16 MED ORDER — METHYLPREDNISOLONE ACETATE 40 MG/ML IJ SUSP
40.0000 mg | INTRAMUSCULAR | Status: AC | PRN
Start: 2023-03-15 — End: 2023-03-15
  Administered 2023-03-15: 40 mg via INTRA_ARTICULAR

## 2023-03-16 NOTE — Telephone Encounter (Signed)
Prescription Request  03/16/2023  LOV: 02/01/2023  What is the name of the medication or equipment? hydrochlorothiazide (HYDRODIURIL) 12.5 MG tablet [[id number]]    Have you contacted your pharmacy to request a refill? Yes   Which pharmacy would you like this sent to?  [facility] [address], [place] - [address] [place] [address] Phone: [phone] Fax: [phone]    Patient notified that their request is being sent to the clinical staff for review and that they should receive a response within 2 business days.   Please advise at [facility] [phone]

## 2023-03-16 NOTE — Progress Notes (Signed)
Office Visit Note   Patient: Jaclyn Brooks           Date of Birth: Oct 05, 1961           MRN: [id number] Visit Date: 03/15/2023 Requested by: Anabel Halon, MD [address] [place],  [place] [address] PCP: Anabel Halon, MD  Subjective: Chief Complaint  Patient presents with   Right Knee - Pain   Right Foot - Pain    HPI: Jaclyn Brooks is a 62 y.o. female who presents to the office reporting right knee and right foot pain.  Patient describes right knee pain that is no different than her typical knee pain that has been slowly worsening over the last several months.  No new injury to the right knee.  No fall.  She would like to have right knee injection today.  She also complains of right foot pain.  Localizes this pain to the medial aspect of her foot around the midfoot.  She does a lot of walking for her job and states that this has caused insidious onset of worsening pain over the last several months.  No history of injury.  No history of prior surgery to the foot.  She has some swelling on the medial aspect of the foot near the arch.  Causes her to limp and going up and down stairs is painful.  Taking Tylenol as needed for pain.  Does have history of gout and she has history of CKD as well.  She cannot take NSAIDs..                ROS: All systems reviewed are negative as they relate to the chief complaint within the history of present illness.  Patient denies fevers or chills.  Assessment & Plan: Visit Diagnoses:  1. Arthritis of right knee   2. Chronic pain of right knee   3. Pain in right foot   4. Posterior tibial tendon dysfunction (PTTD) of right lower extremity     Plan: Patient is a 62 year old female who presents for evaluation of right knee and right foot pain.  Has history of right knee osteoarthritis.  She would like to repeat right knee injection that is given her relief in the past.  Right knee injection successfully administered and patient tolerated procedure  well without complication.  Right foot has insidious onset of worsening medial foot pain around the insertion of the posterior tibialis tendon.  The foot seems slightly flatter compared with the contralateral foot on exam today and she does have reproducible pain with single-leg heel raise on the right foot that is not present on the left foot.  She is able to perform single-leg heel raise however.  Plan is to have her obtain new footwear that has appropriate arch support and follow-up in 1 month if she has continued pain in the right foot.  If no improvement, likely will proceed with MRI of the right foot for further evaluation of posterior tibial tendon dysfunction.  Follow-Up Instructions: No follow-ups on file.   Orders:  Orders Placed This Encounter  Procedures   XR Foot Complete Right   XR KNEE 3 VIEW RIGHT   No orders of the defined types were placed in this encounter.     Procedures: Large Joint Inj: R knee on 03/15/2023 12:23 PM Indications: diagnostic evaluation, joint swelling and pain Details: 18 G 1.5 in needle, superolateral approach  Arthrogram: No  Medications: 5 mL lidocaine 1 %; 40 mg  methylPREDNISolone acetate 40 MG/ML; 4 mL bupivacaine 0.25 % Outcome: tolerated well, no immediate complications Procedure, treatment alternatives, risks and benefits explained, specific risks discussed. Consent was given by the patient. Immediately prior to procedure a time out was called to verify the correct patient, procedure, equipment, support staff and site/side marked as required. Patient was prepped and draped in the usual sterile fashion.       Clinical Data: No additional findings.  Objective: Vital Signs: There were no vitals taken for this visit.  Physical Exam:  Constitutional: Patient appears well-developed HEENT:  Head: Normocephalic Eyes:EOM are normal Neck: Normal range of motion Cardiovascular: Normal rate Pulmonary/chest: Effort normal Neurologic: Patient  is alert Skin: Skin is warm Psychiatric: Patient has normal mood and affect  Ortho Exam: Ortho exam demonstrates right knee with 0 degrees extension and 115 degrees knee flexion.  She has no calf tenderness.  Negative Homans' sign.  She has no pain with hip range of motion.  Able to perform straight leg raise without extensor lag.  No cellulitis or skin changes noted overlying the right knee or the right foot.  Right foot with intact ankle dorsiflexion, plantarflexion, inversion, eversion.  She has palpable DP pulse.  No tenderness over the medial malleolus, lateral malleolus, Lisfranc complex, fifth metatarsal base, Achilles tendon.  Achilles tendon is palpable and intact.  She has palpable and intact EHL and anterior tibialis tendons.  She has slight increase flatfoot deformity of the right foot relative to the left.  She is able to perform single-leg heel raise though this does reproduce her pain.  Specialty Comments:  No specialty comments available.  Imaging: No results found.   PMFS History: Patient Active Problem List   Diagnosis Date Noted   Diarrhea of presumed infectious origin 02/01/2023   Acute bronchitis 12/22/2022   Intractable migraine with aura without status migrainosus 08/15/2022   Acute otitis externa of right ear 07/13/2022   Traumatic hematoma of right lower leg 07/13/2022   History of total abdominal hysterectomy 07/04/2022   GAD (generalized anxiety disorder) 04/28/2022   Caregiver stress 04/28/2022   Chronic left-sided low back pain without sciatica 04/13/2022   Contact dermatitis 04/13/2022   Primary osteoarthritis of right knee 03/09/2022   Refused influenza vaccine 12/02/2021   Encounter for general adult medical examination with abnormal findings 11/30/2021   Epidermal cyst 11/30/2021   Acute frontal sinusitis 09/21/2021   Allergic rhinitis 08/29/2021   Nausea 08/27/2021   Hemorrhoids 07/07/2021   Gout 03/25/2021   Diabetes mellitus without complication  (HCC) 07/14/2020   Primary osteoarthritis of right foot 05/12/2020   GERD (gastroesophageal reflux disease) 05/31/2019   Solitary kidney, acquired 05/10/2019   Carotid arterial disease (HCC) 12/11/2018   COPD, 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/01/2018   CKD (chronic kidney disease) stage 3, GFR 30-59 ml/min (HCC) 10/01/2018   Aortic valve sclerosis 10/01/2018   Hypertension 09/11/2018   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HCC)     Family History  Problem Relation Age of Onset   Hypertension Mother    Heart disease Mother        Cardiac Arrest    Hypertension Father    Cancer Father    Hypertension Sister    Cancer Brother  Bladder Cancer    Hypertension Brother    Hypertension Sister     Past Surgical History:  Procedure Laterality Date   CHOLECYSTECTOMY N/A 01/28/2015   Procedure: LAPAROSCOPIC CHOLECYSTECTOMY;  Surgeon: Franky Macho Md, MD;  Location: [facility];  Service: General;  Laterality: N/A;   excision of bone spurs Bilateral    Big toes   renal aneurysm Right    right coil-and only has function of left kidney   TOTAL ABDOMINAL HYSTERECTOMY     X-STOP IMPLANTATION     Social History   Occupational History   Occupation: Retired  Tobacco Use   Smoking status: Former    Packs/day: 0.25    Years: 20.00    Additional pack years: 0.00    Total pack years: 5.00    Types: Cigarettes    Quit date: 2019    Years since quitting: 5.4   Smokeless tobacco: Never  Vaping Use   Vaping Use: Never used  Substance and Sexual Activity   Alcohol use: Not Currently    Comment: occasionally   Drug use: No   Sexual activity: Yes    Birth control/protection: Surgical

## 2023-03-17 ENCOUNTER — Other Ambulatory Visit: Payer: Self-pay

## 2023-03-17 MED ORDER — HYDROCHLOROTHIAZIDE 12.5 MG PO TABS: 12.5000 mg | ORAL_TABLET | Freq: Every day | ORAL | 0 refills | Status: DC

## 2023-03-17 NOTE — Telephone Encounter (Signed)
Refills sent

## 2023-03-21 DIAGNOSIS — N2581 Secondary hyperparathyroidism of renal origin: Secondary | ICD-10-CM | POA: Diagnosis not present

## 2023-03-21 DIAGNOSIS — I722 Aneurysm of renal artery: Secondary | ICD-10-CM | POA: Diagnosis not present

## 2023-03-21 DIAGNOSIS — N1832 Chronic kidney disease, stage 3b: Secondary | ICD-10-CM | POA: Diagnosis not present

## 2023-03-21 DIAGNOSIS — R809 Proteinuria, unspecified: Secondary | ICD-10-CM | POA: Diagnosis not present

## 2023-03-21 DIAGNOSIS — I701 Atherosclerosis of renal artery: Secondary | ICD-10-CM | POA: Diagnosis not present

## 2023-03-21 DIAGNOSIS — I129 Hypertensive chronic kidney disease with stage 1 through stage 4 chronic kidney disease, or unspecified chronic kidney disease: Secondary | ICD-10-CM | POA: Diagnosis not present

## 2023-03-21 DIAGNOSIS — Z905 Acquired absence of kidney: Secondary | ICD-10-CM | POA: Diagnosis not present

## 2023-03-21 LAB — PROTEIN / CREATININE RATIO, URINE
Albumin, U: 151.3
Creatinine, Urine: 87.2

## 2023-03-21 LAB — BASIC METABOLIC PANEL
BUN: 20 (ref 4–21)
CO2: 28 — AB (ref 13–22)
Creatinine: 1.4 — AB (ref 0.5–1.1)
Glucose: 110

## 2023-03-21 LAB — LAB REPORT - SCANNED: EGFR: 44

## 2023-03-21 LAB — MICROALBUMIN / CREATININE URINE RATIO: Microalb Creat Ratio: 1735

## 2023-03-21 LAB — COMPREHENSIVE METABOLIC PANEL: eGFR: 44

## 2023-03-21 LAB — VITAMIN D 25 HYDROXY (VIT D DEFICIENCY, FRACTURES): Vit D, 25-Hydroxy: 10.6

## 2023-03-28 ENCOUNTER — Ambulatory Visit
Admission: EM | Admit: 2023-03-28 | Discharge: 2023-03-28 | Disposition: A | Discharge status: Home / Self Care | Payer: Medicare Other | Attending: Nurse Practitioner | Admitting: Nurse Practitioner

## 2023-03-28 DIAGNOSIS — Z1152 Encounter for screening for COVID-19: Secondary | ICD-10-CM | POA: Diagnosis not present

## 2023-03-28 DIAGNOSIS — J029 Acute pharyngitis, unspecified: Secondary | ICD-10-CM | POA: Insufficient documentation

## 2023-03-28 DIAGNOSIS — J069 Acute upper respiratory infection, unspecified: Secondary | ICD-10-CM | POA: Insufficient documentation

## 2023-03-28 LAB — POCT RAPID STREP A (OFFICE): Rapid Strep A Screen: NEGATIVE

## 2023-03-28 NOTE — Discharge Instructions (Signed)
You have a viral upper respiratory infection.  Symptoms should improve over the next week to 10 days.  If you develop chest pain or shortness of breath, go to the emergency room.  We have tested you today for COVID-19.  You will see the results in Mychart and we will call you with positive results.  Please stay home and isolate until you are aware of the results.    Some things that can make you feel better are: - Increased rest - Increasing fluid with water/sugar free electrolytes - Acetaminophen as needed for fever/pain - Salt water gargling, chloraseptic spray and throat lozenges for sore throat - OTC guaifenesin (Mucinex) 600 mg twice daily for congestion - Saline sinus flushes or a neti pot - Humidifying the air 

## 2023-03-28 NOTE — ED Triage Notes (Signed)
Pt states sore throat since yesterday,  states she had strep throat last month and feels the same way.

## 2023-03-28 NOTE — ED Provider Notes (Signed)
[facility]    CSN: [id number] Arrival date & time: 03/28/23  1201      History   Chief Complaint Chief Complaint  Patient presents with   Sore Throat    HPI Jaclyn Brooks is a 62 y.o. female.   Patient presents today with 1 day history of sore throat, slight cough and congestion, runny and stuffy nose, headache, and ear pain.  She denies fever, body aches or chills, chest pain, shortness of breath, abdominal pain, nausea/vomiting, and diarrhea.  Also reports energy levels are decreased as well as appetite is decreased.  No known sick contacts, although she works at a [employer].  Has not been taking anything for symptoms so far.  Reports symptoms are similar when she had strep throat approximately 1 month ago.  Reports symptoms fully improved after treatment for strep throat.    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[facility])     Patient Active Problem List   Diagnosis Date Noted   Diarrhea of presumed infectious origin 02/01/2023   Acute bronchitis 12/22/2022   Intractable migraine with aura without status migrainosus 08/15/2022   Acute otitis externa of right ear 07/13/2022   Traumatic hematoma of right lower leg 07/13/2022   History of total abdominal hysterectomy 07/04/2022   GAD (generalized anxiety disorder) 04/28/2022   Caregiver stress 04/28/2022   Chronic left-sided low back pain without sciatica 04/13/2022   Contact dermatitis 04/13/2022   Primary osteoarthritis of right knee 03/09/2022   Refused influenza vaccine 12/02/2021   Encounter for general adult medical examination with abnormal findings 11/30/2021   Epidermal cyst 11/30/2021   Acute frontal sinusitis 09/21/2021   Allergic rhinitis 08/29/2021   Nausea 08/27/2021   Hemorrhoids 07/07/2021   Gout 03/25/2021    Diabetes mellitus without complication (HCC) 07/14/2020   Primary osteoarthritis of right foot 05/12/2020   GERD (gastroesophageal reflux disease) 05/31/2019   Solitary kidney, acquired 05/10/2019   Carotid arterial disease (HCC) 12/11/2018   COPD, 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/01/2018   CKD (chronic kidney disease) stage 3, GFR 30-59 ml/min (HCC) 10/01/2018   Aortic valve sclerosis 10/01/2018   Hypertension 09/11/2018    Past Surgical History:  Procedure Laterality Date   CHOLECYSTECTOMY N/A 01/28/2015   Procedure: LAPAROSCOPIC CHOLECYSTECTOMY;  Surgeon: Franky Macho Md, MD;  Location: [facility];  Service: General;  Laterality: N/A;   excision of bone spurs Bilateral    Big toes   renal aneurysm Right    right coil-and only has function of left kidney   TOTAL ABDOMINAL HYSTERECTOMY     X-STOP IMPLANTATION      OB History     Gravida  1   Para  1   Term      Preterm  1   AB      Living         SAB      IAB      Ectopic      Multiple      Live Births               Home Medications    Prior to Admission medications   Medication Sig Start Date End Date Taking? Authorizing Provider  albuterol (VENTOLIN HFA) 108 (90 Base) MCG/ACT  inhaler Inhale 1-2 puffs into the lungs every 6 (six) hours as needed for wheezing or shortness of breath. 12/22/22   Anabel Halon, MD  allopurinol (ZYLOPRIM) 100 MG tablet Take 2 tablets by mouth once daily 02/09/23   Anabel Halon, MD  atenolol (TENORMIN) 50 MG tablet Take 1 tablet by mouth once daily 02/22/23   Anabel Halon, MD  budesonide-formoterol [facility]) 80-4.5 MCG/ACT inhaler Inhale 2 puffs into the lungs 2 (two) times daily. 10/21/21   Anabel Halon, MD  busPIRone (BUSPAR) 5 MG tablet Take 1 tablet (5 mg total) by mouth 2 (two) times daily. 07/20/22   Anabel Halon, MD  butalbital-acetaminophen-caffeine (FIORICET) [phone] MG tablet Take 1 tablet by mouth every 12 (twelve) hours as needed for headache or migraine. 08/15/22   Patel,  Earlie Lou, MD  CARTIA XT 240 MG 24 hr capsule Take 1 capsule (240 mg total) by mouth daily. 11/24/22   Anabel Halon, MD  clopidogrel (PLAVIX) 75 MG tablet Take 1 tablet by mouth in the evening 03/02/23   Anabel Halon, MD  dexlansoprazole (DEXILANT) 60 MG capsule Take 1 capsule (60 mg total) by mouth daily. 11/24/22   Anabel Halon, MD  guaiFENesin-codeine (CHERATUSSIN AC) 100-10 MG/5ML syrup Take 5 mLs by mouth 3 (three) times daily as needed for cough. 01/13/23   Anabel Halon, MD  hydrochlorothiazide (HYDRODIURIL) 12.5 MG tablet Take 1 tablet (12.5 mg total) by mouth daily. 03/17/23   Anabel Halon, MD  metoCLOPramide (REGLAN) 5 MG tablet Take 1 tablet (5 mg total) by mouth every 8 (eight) hours as needed for nausea. 11/24/22   Anabel Halon, MD  montelukast (SINGULAIR) 10 MG tablet Take 1 tablet (10 mg total) by mouth at bedtime. 02/01/23   Anabel Halon, MD  atorvastatin (LIPITOR) 20 MG tablet Take 1 tablet (20 mg total) by mouth at bedtime. 11/25/19 02/18/20  Salley Scarlet, MD    Family History Family History  Problem Relation Age of Onset   Hypertension Mother    Heart disease Mother        Cardiac Arrest    Hypertension Father    Cancer Father    Hypertension Sister    Cancer Brother        Bladder Cancer    Hypertension Brother    Hypertension Sister     Social History Social History   Tobacco Use   Smoking status: Former    Packs/day: 0.25    Years: 20.00    Additional pack years: 0.00    Total pack years: 5.00    Types: Cigarettes    Quit date: 2019    Years since quitting: 5.4   Smokeless tobacco: Never  Vaping Use   Vaping Use: Never used  Substance Use Topics   Alcohol use: Not Currently    Comment: occasionally   Drug use: No     Allergies   Bee venom, Nsaids, and Shellfish allergy   Review of Systems Review of Systems Per HPI  Physical Exam Triage Vital Signs ED Triage Vitals  Enc Vitals Group     BP 03/28/23 1210 (!) 144/70      Pulse Rate 03/28/23 1210 (!) 58     Resp 03/28/23 1210 16     Temp 03/28/23 1210 98.4 F (36.9 C)     Temp Source 03/28/23 1210 Oral     SpO2 03/28/23 1210 98 %     Weight --  Height --      Head Circumference --      Peak Flow --      Pain Score 03/28/23 1211 7     Pain Loc --      Pain Edu? --      Excl. in GC? --    No data found.  Updated Vital Signs BP (!) 144/70 (BP Location: Right Arm)   Pulse (!) 58   Temp 98.4 F (36.9 C) (Oral)   Resp 16   SpO2 98%   Visual Acuity Right Eye Distance:   Left Eye Distance:   Bilateral Distance:    Right Eye Near:   Left Eye Near:    Bilateral Near:     Physical Exam Vitals and nursing note reviewed.  Constitutional:      General: She is not in acute distress.    Appearance: Normal appearance. She is not ill-appearing or toxic-appearing.  HENT:     Head: Normocephalic and atraumatic.     Right Ear: Tympanic membrane, ear canal and external ear normal.     Left Ear: Tympanic membrane, ear canal and external ear normal.     Nose: Congestion and rhinorrhea present.     Mouth/Throat:     Mouth: Mucous membranes are moist.     Pharynx: Oropharynx is clear. No oropharyngeal exudate or posterior oropharyngeal erythema.  Eyes:     General: No scleral icterus.    Extraocular Movements: Extraocular movements intact.  Cardiovascular:     Rate and Rhythm: Normal rate and regular rhythm.  Pulmonary:     Effort: Pulmonary effort is normal. No respiratory distress.     Breath sounds: Normal breath sounds. No wheezing, rhonchi or rales.  Musculoskeletal:     Cervical back: Normal range of motion and neck supple.  Lymphadenopathy:     Cervical: No cervical adenopathy.  Skin:    General: Skin is warm and dry.     Coloration: Skin is not jaundiced or pale.     Findings: No erythema or rash.  Neurological:     Mental Status: She is alert and oriented to person, place, and time.  Psychiatric:        Behavior: Behavior is  cooperative.      UC Treatments / Results  Labs (all labs ordered are listed, but only abnormal results are displayed) Labs Reviewed  SARS CORONAVIRUS 2 (TAT 6-24 HRS)  POCT RAPID STREP A (OFFICE)    EKG   Radiology No results found.  Procedures Procedures (including critical care time)  Medications Ordered in UC Medications - No data to display  Initial Impression / Assessment and Plan / UC Course  I have reviewed the triage vital signs and the nursing notes.  Pertinent labs & imaging results that were available during my care of the patient were reviewed by me and considered in my medical decision making (see chart for details).   Patient is well-appearing, normotensive, afebrile, not tachycardic, not tachypneic, oxygenating well on room air.    1. Viral URI with cough 2. Acute pharyngitis, unspecified etiology 3. Encounter for screening for COVID-19 Suspect viral etiology Rapid strep test is negative; throat culture deferred as Centor score today is 0 Vitals and exam today are reassuring COVID-19 testing obtained; she is a candidate for molnupiravir if she tests positive Supportive care discussed with patient ER and return precautions discussed  The patient was given the opportunity to ask questions.  All questions answered to their satisfaction.  The  patient is in agreement to this plan.    Final Clinical Impressions(s) / UC Diagnoses   Final diagnoses:  Viral URI with cough  Acute pharyngitis, unspecified etiology  Encounter for screening for COVID-19     Discharge Instructions      You have a viral upper respiratory infection.  Symptoms should improve over the next week to 10 days.  If you develop chest pain or shortness of breath, go to the emergency room.  We have tested you today for COVID-19.  You will see the results in Mychart and we will call you with positive results.  Please stay home and isolate until you are aware of the results.    Some  things that can make you feel better are: - Increased rest - Increasing fluid with water/sugar free electrolytes - Acetaminophen as needed for fever/pain - Salt water gargling, chloraseptic spray and throat lozenges for sore throat - OTC guaifenesin (Mucinex) 600 mg twice daily for congestion - Saline sinus flushes or a neti pot - Humidifying the air   ED Prescriptions   None    PDMP not reviewed this encounter.   Valentino Nose, NP 03/28/23 1407

## 2023-03-29 LAB — SARS CORONAVIRUS 2 (TAT 6-24 HRS): SARS Coronavirus 2: NEGATIVE

## 2023-03-30 ENCOUNTER — Encounter: Payer: Self-pay | Admitting: Nephrology

## 2023-03-31 ENCOUNTER — Telehealth: Payer: Self-pay | Admitting: Internal Medicine

## 2023-03-31 NOTE — Telephone Encounter (Signed)
[employer]  FMLA forms  Copied Noted Sleeved put into provider box

## 2023-04-04 DIAGNOSIS — Z0279 Encounter for issue of other medical certificate: Secondary | ICD-10-CM

## 2023-04-06 ENCOUNTER — Encounter: Payer: Self-pay | Admitting: Internal Medicine

## 2023-04-06 ENCOUNTER — Ambulatory Visit (INDEPENDENT_AMBULATORY_CARE_PROVIDER_SITE_OTHER): Payer: Medicare Other | Admitting: Internal Medicine

## 2023-04-06 VITALS — BP 145/74 | HR 53 | Ht 67.5 in | Wt 179.0 lb

## 2023-04-06 DIAGNOSIS — M1711 Unilateral primary osteoarthritis, right knee: Secondary | ICD-10-CM | POA: Diagnosis not present

## 2023-04-06 NOTE — Progress Notes (Signed)
Acute Office Visit  Subjective:     Patient ID: Jaclyn Brooks, female    DOB: 08/02/1961, 62 y.o.   MRN: [id number]  Chief Complaint  Patient presents with   Generalized Body Aches    Feels like pins and needles in legs after walking. Usually last 2-3 days. Noticed dry throat as well.     Jaclyn Brooks presents today for an acute visit endorsing bilateral lower extremity pain.  This prevented her from going to work today.  She states that her pain has recently resolved.  She has a past medical history significant for primary osteoarthritis of the right knee.  She works on a concrete floor and has to climb steps frequently.  FMLA paperwork has recently been submitted as she misses work roughly once per month due to significant lower extremity pain.  Review of Systems  Musculoskeletal:  Positive for joint pain (Right knee pain).      Objective:    BP (!) 145/74   Pulse (!) 53   Ht 5' 7.5" (1.715 m)   Wt 179 lb (81.2 kg)   SpO2 98%   BMI 27.62 kg/m   Physical Exam Constitutional:      General: She is not in acute distress.    Appearance: Normal appearance. She is not toxic-appearing.  HENT:     Head: Normocephalic and atraumatic.     Right Ear: External ear normal.     Left Ear: External ear normal.     Nose: Nose normal. No congestion or rhinorrhea.     Mouth/Throat:     Mouth: Mucous membranes are moist.     Pharynx: Oropharynx is clear. No oropharyngeal exudate or posterior oropharyngeal erythema.  Eyes:     General: No scleral icterus.    Extraocular Movements: Extraocular movements intact.     Conjunctiva/sclera: Conjunctivae normal.     Pupils: Pupils are equal, round, and reactive to light.  Cardiovascular:     Rate and Rhythm: Normal rate and regular rhythm.     Pulses: Normal pulses.     Heart sounds: Normal heart sounds. No murmur heard.    No friction rub. No gallop.  Pulmonary:     Effort: Pulmonary effort is normal.     Breath sounds: Normal breath sounds.  No wheezing, rhonchi or rales.  Abdominal:     General: Abdomen is flat. Bowel sounds are normal. There is no distension.     Palpations: Abdomen is soft.     Tenderness: There is no abdominal tenderness.  Musculoskeletal:        General: No swelling. Normal range of motion.     Cervical back: Normal range of motion.     Right lower leg: No edema.     Left lower leg: No edema.  Lymphadenopathy:     Cervical: No cervical adenopathy.  Skin:    General: Skin is warm and dry.     Capillary Refill: Capillary refill takes less than 2 seconds.     Coloration: Skin is not jaundiced.  Neurological:     General: No focal deficit present.     Mental Status: She is alert and oriented to person, place, and time.  Psychiatric:        Mood and Affect: Mood normal.        Behavior: Behavior normal.       Assessment & Plan:   Problem List Items Addressed This Visit       Primary osteoarthritis of right knee -  Primary    Presenting today for an acute visit endorsing bilateral lower extremity discomfort (R>L).  She has a history of right knee OA.  Previously followed by orthopedic surgery.  Pain was worse this morning and caused her to miss work.  Her job requires her to walk on concrete floors and up and down stairs regularly.  FMLA paperwork has been submitted recently and is pending review. -Continue as needed use of Tylenol for pain relief -Work note provided for today      Return in about 3 months (around 07/07/2023).  Billie Lade, MD

## 2023-04-06 NOTE — Assessment & Plan Note (Signed)
Presenting today for an acute visit endorsing bilateral lower extremity discomfort (R>L).  She has a history of right knee OA.  Previously followed by orthopedic surgery.  Pain was worse this morning and caused her to miss work.  Her job requires her to walk on concrete floors and up and down stairs regularly.  FMLA paperwork has been submitted recently and is pending review. -Continue as needed use of Tylenol for pain relief -Work note provided for today

## 2023-04-06 NOTE — Patient Instructions (Signed)
It was a pleasure to see you today.  Thank you for giving [place] the opportunity to be involved in your care.  Below is a brief recap of your visit and next steps.  We will plan to see you again in 3 months.  Summary Work note provided today Follow up with Dr. Allena Katz in 3 months

## 2023-04-07 DIAGNOSIS — R030 Elevated blood-pressure reading, without diagnosis of hypertension: Secondary | ICD-10-CM | POA: Diagnosis not present

## 2023-04-07 DIAGNOSIS — M791 Myalgia, unspecified site: Secondary | ICD-10-CM | POA: Diagnosis not present

## 2023-04-10 ENCOUNTER — Ambulatory Visit
Admission: EM | Admit: 2023-04-10 | Discharge: 2023-04-10 | Disposition: A | Discharge status: Home / Self Care | Payer: Medicare Other | Attending: Nurse Practitioner | Admitting: Nurse Practitioner

## 2023-04-10 ENCOUNTER — Encounter: Payer: Self-pay | Admitting: Internal Medicine

## 2023-04-10 ENCOUNTER — Ambulatory Visit (INDEPENDENT_AMBULATORY_CARE_PROVIDER_SITE_OTHER): Payer: Medicare Other | Admitting: Internal Medicine

## 2023-04-10 VITALS — BP 141/70 | HR 60 | Ht 66.0 in | Wt 177.0 lb

## 2023-04-10 DIAGNOSIS — J029 Acute pharyngitis, unspecified: Secondary | ICD-10-CM | POA: Diagnosis not present

## 2023-04-10 DIAGNOSIS — R300 Dysuria: Secondary | ICD-10-CM

## 2023-04-10 DIAGNOSIS — I10 Essential (primary) hypertension: Secondary | ICD-10-CM

## 2023-04-10 DIAGNOSIS — R8290 Unspecified abnormal findings in urine: Secondary | ICD-10-CM

## 2023-04-10 LAB — POCT URINALYSIS DIP (CLINITEK)
Bilirubin, UA: NEGATIVE
Blood, UA: NEGATIVE
Glucose, UA: NEGATIVE mg/dL
Ketones, POC UA: NEGATIVE mg/dL
Leukocytes, UA: NEGATIVE
Nitrite, UA: NEGATIVE
POC PROTEIN,UA: >=300 — AB
Spec Grav, UA: 1.025 (ref 1.010–1.025)
Urobilinogen, UA: 0.2 E.U./dL
pH, UA: 5.5 (ref 5.0–8.0)

## 2023-04-10 LAB — POCT RAPID STREP A (OFFICE): Rapid Strep A Screen: NEGATIVE

## 2023-04-10 NOTE — Discharge Instructions (Signed)
Rapid strep throat test today is negative.  Suspect you do have a viral infection or uncontrolled postnasal drainage from allergies.  Start taking Flonase twice daily to help with the postnasal drainage.  Increase water intake.  Seek care for persistent/worsening symptoms despite treatment.

## 2023-04-10 NOTE — ED Triage Notes (Signed)
Pt presents with sore throat, headache, fatigue, and body aches that started a week ago. Taking tylenol.

## 2023-04-10 NOTE — Patient Instructions (Signed)
Thank you, Jaclyn Brooks for allowing [place] to provide your care today.   I have ordered the following labs for you:   Lab Orders         NuSwab Vaginitis Plus (VG+)         POCT URINALYSIS DIP (CLINITEK)      You do not have a urinary tract infection  We will test you for sexually transmitted infection  Follow up with Dr. Allena Katz for HTN .       Thurmon Fair, M.D.

## 2023-04-10 NOTE — Telephone Encounter (Signed)
Faxed on 06/25 with confirmation. Copy sent to scan center. Form fee applied.

## 2023-04-10 NOTE — ED Provider Notes (Signed)
[facility]    CSN: [id number] Arrival date & time: 04/10/23  1725      History   Chief Complaint Chief Complaint  Patient presents with   Sore Throat    HPI Jaclyn Brooks is a 62 y.o. female.   Patient presents today with bodyaches, sore throat, runny nose and postnasal drainage, headache, nausea without vomiting, decreased appetite, and fatigue since yesterday.  She denies fever, chills, cough, shortness of breath or chest pain, stuffy nose, abdominal pain, vomiting, diarrhea, and loss of taste or smell.  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  Has been taking Tylenol for symptoms with some improvement.  Patient reports history of allergies for which she takes as well.  She also takes Flonase occasionally but not every day.    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[facility])     Patient Active Problem List   Diagnosis Date Noted   Diarrhea of presumed infectious origin 02/01/2023   Acute bronchitis 12/22/2022   Intractable migraine with aura without status migrainosus 08/15/2022   Acute otitis externa of right ear 07/13/2022   Traumatic hematoma of right lower leg 07/13/2022   History of total abdominal hysterectomy 07/04/2022   GAD (generalized anxiety disorder) 04/28/2022   Caregiver stress 04/28/2022   Chronic left-sided low back pain without sciatica 04/13/2022   Contact dermatitis 04/13/2022   Primary osteoarthritis of right knee 03/09/2022   Refused influenza vaccine 12/02/2021   Encounter for general adult medical examination with abnormal findings 11/30/2021   Epidermal cyst 11/30/2021   Acute frontal sinusitis 09/21/2021   Allergic rhinitis 08/29/2021   Nausea 08/27/2021   Hemorrhoids 07/07/2021   Gout 03/25/2021   Diabetes  mellitus without complication (HCC) 07/14/2020   Primary osteoarthritis of right foot 05/12/2020   GERD (gastroesophageal reflux disease) 05/31/2019   Solitary kidney, acquired 05/10/2019   Carotid arterial disease (HCC) 12/11/2018   COPD, 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/01/2018   CKD (chronic kidney disease) stage 3, GFR 30-59 ml/min (HCC) 10/01/2018   Aortic valve sclerosis 10/01/2018   Hypertension 09/11/2018    Past Surgical History:  Procedure Laterality Date   CHOLECYSTECTOMY N/A 01/28/2015   Procedure: LAPAROSCOPIC CHOLECYSTECTOMY;  Surgeon: Franky Macho Md, MD;  Location: [facility];  Service: General;  Laterality: N/A;   excision of bone spurs Bilateral    Big toes   renal aneurysm Right    right coil-and only has function of left kidney   TOTAL ABDOMINAL HYSTERECTOMY     X-STOP IMPLANTATION      OB History     Gravida  1   Para  1   Term      Preterm  1   AB      Living         SAB      IAB      Ectopic      Multiple      Live Births               Home Medications    Prior to Admission medications   Medication Sig Start Date End Date Taking? Authorizing Provider  albuterol (VENTOLIN HFA) 108 (90 Base) MCG/ACT inhaler Inhale 1-2 puffs into the lungs  every 6 (six) hours as needed for wheezing or shortness of breath. 12/22/22  Yes Anabel Halon, MD  allopurinol (ZYLOPRIM) 100 MG tablet Take 2 tablets by mouth once daily 02/09/23  Yes Anabel Halon, MD  atenolol (TENORMIN) 50 MG tablet Take 1 tablet by mouth once daily 02/22/23  Yes Patel, Earlie Lou, MD  budesonide-formoterol [facility]) 80-4.5 MCG/ACT inhaler Inhale 2 puffs into the lungs 2 (two) times daily. 10/21/21  Yes Anabel Halon, MD  butalbital-acetaminophen-caffeine (FIORICET) [phone] MG tablet Take 1 tablet by mouth every 12 (twelve) hours as needed for headache or migraine. 08/15/22  Yes Patel, Earlie Lou, MD  CARTIA XT 240 MG 24 hr capsule Take 1 capsule (240 mg total) by mouth daily. 11/24/22  Yes Anabel Halon, MD  clopidogrel (PLAVIX) 75 MG tablet Take 1 tablet by mouth in the evening 03/02/23  Yes Anabel Halon, MD  dexlansoprazole (DEXILANT) 60 MG capsule Take 1 capsule (60 mg total) by mouth daily. 11/24/22  Yes Anabel Halon, MD  hydrochlorothiazide (HYDRODIURIL) 12.5 MG tablet Take 1 tablet (12.5 mg total) by mouth daily. 03/17/23  Yes Anabel Halon, MD  montelukast (SINGULAIR) 10 MG tablet Take 1 tablet (10 mg total) by mouth at bedtime. 02/01/23  Yes Anabel Halon, MD  busPIRone (BUSPAR) 5 MG tablet Take 1 tablet (5 mg total) by mouth 2 (two) times daily. 07/20/22   Anabel Halon, MD  guaiFENesin-codeine (CHERATUSSIN AC) 100-10 MG/5ML syrup Take 5 mLs by mouth 3 (three) times daily as needed for cough. 01/13/23   Anabel Halon, MD  metoCLOPramide (REGLAN) 5 MG tablet Take 1 tablet (5 mg total) by mouth every 8 (eight) hours as needed for nausea. 11/24/22   Anabel Halon, MD  atorvastatin (LIPITOR) 20 MG tablet Take 1 tablet (20 mg total) by mouth at bedtime. 11/25/19 02/18/20  Salley Scarlet, MD    Family History Family History  Problem Relation Age of Onset   Hypertension Mother    Heart disease Mother        Cardiac Arrest    Hypertension Father    Cancer Father    Hypertension Sister    Cancer Brother        Bladder Cancer    Hypertension Brother    Hypertension Sister     Social History Social History   Tobacco Use   Smoking status: Former    Packs/day: 0.25    Years: 20.00    Additional pack years: 0.00    Total pack years: 5.00    Types: Cigarettes    Quit date: 2019    Years since quitting: 5.5   Smokeless tobacco: Never  Vaping Use   Vaping Use: Never used  Substance Use Topics   Alcohol use: Not Currently    Comment: occasionally   Drug use: No     Allergies   Bee venom, Nsaids, and Shellfish allergy   Review of Systems Review of Systems Per HPI  Physical Exam Triage Vital Signs ED Triage Vitals  Enc Vitals Group     BP 04/10/23  1736 (!) 151/77     Pulse Rate 04/10/23 1736 (!) 58     Resp 04/10/23 1736 17     Temp 04/10/23 1736 98.1 F (36.7 C)     Temp Source 04/10/23 1736 Oral     SpO2 04/10/23 1736 96 %     Weight --      Height --  Head Circumference --      Peak Flow --      Pain Score 04/10/23 1737 8     Pain Loc --      Pain Edu? --      Excl. in GC? --    No data found.  Updated Vital Signs BP (!) 151/77 (BP Location: Right Arm)   Pulse (!) 58   Temp 98.1 F (36.7 C) (Oral)   Resp 17   SpO2 96%   Visual Acuity Right Eye Distance:   Left Eye Distance:   Bilateral Distance:    Right Eye Near:   Left Eye Near:    Bilateral Near:     Physical Exam Vitals and nursing note reviewed.  Constitutional:      General: She is not in acute distress.    Appearance: Normal appearance. She is not ill-appearing or toxic-appearing.  HENT:     Head: Normocephalic and atraumatic.     Right Ear: Tympanic membrane, ear canal and external ear normal. No drainage, swelling or tenderness. No middle ear effusion. Tympanic membrane is not erythematous.     Left Ear: Tympanic membrane, ear canal and external ear normal. No drainage, swelling or tenderness.  No middle ear effusion. Tympanic membrane is not erythematous.     Nose: No congestion or rhinorrhea.     Mouth/Throat:     Mouth: Mucous membranes are moist.     Pharynx: Oropharynx is clear. No oropharyngeal exudate or posterior oropharyngeal erythema.     Tonsils: No tonsillar exudate or tonsillar abscesses. 0 on the right. 0 on the left.     Comments: Post nasal drainage Eyes:     General: No scleral icterus.    Extraocular Movements: Extraocular movements intact.  Cardiovascular:     Rate and Rhythm: Normal rate and regular rhythm.  Pulmonary:     Effort: Pulmonary effort is normal. No respiratory distress.     Breath sounds: Normal breath sounds. No wheezing, rhonchi or rales.  Abdominal:     General: Abdomen is flat. Bowel sounds are  normal. There is no distension.     Palpations: Abdomen is soft.  Musculoskeletal:     Cervical back: Normal range of motion and neck supple.  Lymphadenopathy:     Cervical: Cervical adenopathy present.  Skin:    General: Skin is warm and dry.     Coloration: Skin is not jaundiced or pale.     Findings: No erythema or rash.  Neurological:     Mental Status: She is alert and oriented to person, place, and time.     Motor: No weakness.  Psychiatric:        Behavior: Behavior is cooperative.      UC Treatments / Results  Labs (all labs ordered are listed, but only abnormal results are displayed) Labs Reviewed  POCT RAPID STREP A (OFFICE)    EKG   Radiology No results found.  Procedures Procedures (including critical care time)  Medications Ordered in UC Medications - No data to display  Initial Impression / Assessment and Plan / UC Course  I have reviewed the triage vital signs and the nursing notes.  Pertinent labs & imaging results that were available during my care of the patient were reviewed by me and considered in my medical decision making (see chart for details).   Patient is well-appearing, afebrile, not tachycardic, not tachypneic, oxygenating well on room air.  Patient is mildly hypertensive in urgent care today.  1.  Acute pharyngitis, unspecified etiology Rapid strep throat test negative Centor score today is 0; throat culture deferred COVID-19 testing offered, however patient declines Supportive care discussed with patient Suspect early viral etiology versus uncontrolled postnasal drainage from allergic rhinitis Start Flonase daily Seek care for persistent/worsening symptoms despite treatment Note given for work  The patient was given the opportunity to ask questions.  All questions answered to their satisfaction.  The patient is in agreement to this plan.    Final Clinical Impressions(s) / UC Diagnoses   Final diagnoses:  Acute pharyngitis,  unspecified etiology     Discharge Instructions      Rapid strep throat test today is negative.  Suspect you do have a viral infection or uncontrolled postnasal drainage from allergies.  Start taking Flonase twice daily to help with the postnasal drainage.  Increase water intake.  Seek care for persistent/worsening symptoms despite treatment.     ED Prescriptions   None    PDMP not reviewed this encounter.   Valentino Nose, NP 04/10/23 1840

## 2023-04-10 NOTE — Progress Notes (Signed)
   HPI:Jaclyn Brooks is a 62 y.o. female living with CKD Stage 3a and HTN who presents for evaluation of dehydration and malodorous urine. She noticed this started over the weekend. She has not been spending time outdoors more than normal. She takes hydrochlorothiazide for HTN. This is a chronic medication and reports no prior problems when on this medication. She is sexually active with the same partner. No hematuria, dysuria, increased frequency, fever or change in vaginal discharge.    Physical Exam: Vitals:   04/10/23 1628 04/10/23 1630  BP: (!) 148/73 (!) 141/70  Pulse: 60   SpO2: 98%   Weight: 177 lb (80.3 kg)   Height: 5\' 6"  (1.676 m)      Physical Exam Constitutional:      General: She is not in acute distress.    Appearance: She is not ill-appearing.  HENT:     Mouth/Throat:     Mouth: Mucous membranes are moist.  Cardiovascular:     Rate and Rhythm: Normal rate and regular rhythm.     Heart sounds: No murmur heard. Abdominal:     General: Bowel sounds are normal. There is no distension.     Tenderness: There is abdominal tenderness (mild suprapubic tenderness with palpation). There is no guarding.  Skin:    General: Skin is warm.     Comments: Normal skin turgor      Assessment & Plan:   Celester was seen today for dehydration.  Primary hypertension Assessment & Plan: BP 148/73 today.  Chronic problem, uncontrolled  She is taking hydrochlorothiazide 12.5 mg, prescribed hydrochlorothiazide 25 mg on review of nephrology notes from last months visit. Mentioned other medications for blood pressure if she is finding herself dehydrated. She does not want me to make medication changes today. Recommend following up with PCP to discuss uncontrolled HTN.    Malodorous urine Assessment & Plan: Assessment/Plan: Acute uncomplicated illness POC dipstick does not show signs of infection. She does have proteinuria. Review of her notes from care everywhere shows she is  followed by Dr.Foster, nephrologist at [facility]. This is a chronic problem. She was last seen June 12th 2024. Will test for STI.  Provided note she was seen in office today. She request note to be out until next Monday. I do not think its medically necessary to be out of work for one week for this acute illness. Recommend following up with PCP to discuss FMLA.   Orders: -     POCT URINALYSIS DIP (CLINITEK) -     NuSwab Vaginitis Plus (VG+)      [name], MD

## 2023-04-11 NOTE — Telephone Encounter (Signed)
Pt received forms at visit on 7/1

## 2023-04-12 ENCOUNTER — Other Ambulatory Visit: Payer: Self-pay | Admitting: Internal Medicine

## 2023-04-12 DIAGNOSIS — M1A0690 Idiopathic chronic gout, unspecified knee, without tophus (tophi): Secondary | ICD-10-CM

## 2023-04-14 LAB — NUSWAB VAGINITIS PLUS (VG+)
Candida albicans, NAA: NEGATIVE
Candida glabrata, NAA: NEGATIVE
Chlamydia trachomatis, NAA: NEGATIVE
Neisseria gonorrhoeae, NAA: NEGATIVE
Trich vag by NAA: NEGATIVE

## 2023-04-16 DIAGNOSIS — R8290 Unspecified abnormal findings in urine: Secondary | ICD-10-CM | POA: Insufficient documentation

## 2023-04-16 DIAGNOSIS — R300 Dysuria: Secondary | ICD-10-CM | POA: Insufficient documentation

## 2023-04-16 NOTE — Assessment & Plan Note (Deleted)
Assessment/Plan: Acute uncomplicated illness POC dipstick does not show signs of infection. She does have proteinuria. Review of her Patient is concerned for fish odor and sexually active. Will test for STI. No exam findings concerning at this time. Provided note she was seen in office today. She request note to be out until next Monday. I do not think its medically necessary to be out of work for one week for this acute illness. Recommend following up with PCP to discuss FMLA.

## 2023-04-16 NOTE — Assessment & Plan Note (Addendum)
BP 148/73 today.  Chronic problem, uncontrolled  She is taking hydrochlorothiazide 12.5 mg, prescribed hydrochlorothiazide 25 mg on review of nephrology notes from last months visit. Mentioned other medications for blood pressure if she is finding herself dehydrated. She does not want me to make medication changes today. Recommend following up with PCP to discuss uncontrolled HTN.

## 2023-04-16 NOTE — Assessment & Plan Note (Addendum)
Assessment/Plan: Acute uncomplicated illness POC dipstick does not show signs of infection. She does have proteinuria. Review of her notes from care everywhere shows she is followed by Dr.Foster, nephrologist at [facility]. This is a chronic problem. She was last seen June 12th 2024. Will test for STI.  Provided note she was seen in office today. She request note to be out until next Monday. I do not think its medically necessary to be out of work for one week for this acute illness. Recommend following up with PCP to discuss FMLA.

## 2023-04-26 ENCOUNTER — Ambulatory Visit: Payer: Medicare Other | Admitting: Surgical

## 2023-04-26 ENCOUNTER — Other Ambulatory Visit (INDEPENDENT_AMBULATORY_CARE_PROVIDER_SITE_OTHER): Payer: Medicare Other

## 2023-04-26 ENCOUNTER — Other Ambulatory Visit: Payer: Self-pay

## 2023-04-26 DIAGNOSIS — M25552 Pain in left hip: Secondary | ICD-10-CM

## 2023-04-26 DIAGNOSIS — M1612 Unilateral primary osteoarthritis, left hip: Secondary | ICD-10-CM

## 2023-04-26 DIAGNOSIS — M25551 Pain in right hip: Secondary | ICD-10-CM | POA: Diagnosis not present

## 2023-04-26 NOTE — Telephone Encounter (Signed)
[employer] forms Revision forms page 4 of 9 and 5 of 9.  All blanks must be filled out.  Example:Can not be left blank must enter n/a or date to unknown or not at this time can be the answer.  Need back ASAP extended   Copied  Noted Sleeved (Put in provider box)  Fax when completed if all blanks completed and call patient when done so. Fax   # [phone]

## 2023-04-27 ENCOUNTER — Ambulatory Visit: Payer: Medicare Other | Admitting: Internal Medicine

## 2023-04-27 ENCOUNTER — Encounter: Payer: Self-pay | Admitting: Surgical

## 2023-04-27 MED ORDER — BUPIVACAINE HCL 0.25 % IJ SOLN
4.0000 mL | INTRAMUSCULAR | Status: AC | PRN
Start: 2023-04-26 — End: 2023-04-26
  Administered 2023-04-26: 4 mL via INTRA_ARTICULAR

## 2023-04-27 MED ORDER — METHYLPREDNISOLONE ACETATE 40 MG/ML IJ SUSP
40.0000 mg | INTRAMUSCULAR | Status: AC | PRN
Start: 2023-04-26 — End: 2023-04-26
  Administered 2023-04-26: 40 mg via INTRA_ARTICULAR

## 2023-04-27 MED ORDER — LIDOCAINE HCL 1 % IJ SOLN
5.0000 mL | INTRAMUSCULAR | Status: AC | PRN
Start: 2023-04-26 — End: 2023-04-26
  Administered 2023-04-26: 5 mL

## 2023-04-27 NOTE — Progress Notes (Signed)
Office Visit Note   Patient: Jaclyn Brooks           Date of Birth: 1960/12/11           MRN: [id number] Visit Date: 04/26/2023 Requested by: Anabel Halon, MD [address] [place],  [place] [address] PCP: Anabel Halon, MD  Subjective: Chief Complaint  Patient presents with   Left Hip - Pain   Right Hip - Pain    HPI: Jaclyn Brooks is a 62 y.o. female who presents to the office reporting left leg pain.  Patient states she has had pain over the last month.  She describes anterior thigh and groin pain that radiates down to about the level of the knee.  She has occasional burning sensation as well.  Increased pain with activity, especially when she is walking up the stairs at her job where she works at a [employer].  She does have to wear a belt with her walkie-talkie sitting on the left side of the belt which she feels may be contributing to her symptoms.  She denies any numbness or tingling.  Occasional low back pain but does not really have any radicular pain down the posterior aspect of the leg.  Pain will occasionally wake her up at night.  Taking Tylenol without much relief.  She has history of CKD so she cannot take NSAIDs..                ROS: All systems reviewed are negative as they relate to the chief complaint within the history of present illness.  Patient denies fevers or chills.  Assessment & Plan: Visit Diagnoses:  1. Arthritis of left hip   2. Bilateral hip pain     Plan: Patient is a 62 year old female who presents for evaluation of left hip pain primarily.  Has had pain over the last month without any history of known injury.  Describes anterior groin pain that radiates down the anterior thigh.  Based on radiographs demonstrating hip arthritis, her history and exam seems most consistent with this being the source of her pain.  After discussion of options, she would like to try injection.  Left hip intra-articular injection was administered under ultrasound guidance and  patient tolerated procedure well without complication.  We will see her back as needed as long as pain improves from this injection.  Another thought would be that this pain could be coming from meralgia paresthetica given the burning quality of her symptoms and the fact that she has to wear a heavy belt at work throughout the day.  She will try moving some of the instruments from her left side of the belt to the right side and see if this helps her left hip pain.  If no improvement, could consider diagnostic injection for meralgia paresthetica.  Follow-Up Instructions: No follow-ups on file.   Orders:  Orders Placed This Encounter  Procedures   XR HIPS BILAT W OR W/O PELVIS 3-4 VIEWS   US Guided Needle Placement - No Linked Charges   No orders of the defined types were placed in this encounter.     Procedures: Large Joint Inj: L hip joint on 04/26/2023 12:04 PM Indications: pain and diagnostic evaluation Details: 22 G 3.5 in needle, ultrasound-guided lateral approach  Arthrogram: No  Medications: 5 mL lidocaine 1 %; 4 mL bupivacaine 0.25 %; 40 mg methylPREDNISolone acetate 40 MG/ML Outcome: tolerated well, no immediate complications Procedure, treatment alternatives, risks and benefits explained, specific  risks discussed. Consent was given by the patient. Immediately prior to procedure a time out was called to verify the correct patient, procedure, equipment, support staff and site/side marked as required. Patient was prepped and draped in the usual sterile fashion.       Clinical Data: No additional findings.  Objective: Vital Signs: There were no vitals taken for this visit.  Physical Exam:  Constitutional: Patient appears well-developed HEENT:  Head: Normocephalic Eyes:EOM are normal Neck: Normal range of motion Cardiovascular: Normal rate Pulmonary/chest: Effort normal Neurologic: Patient is alert Skin: Skin is warm Psychiatric: Patient has normal mood and  affect  Ortho Exam: Ortho exam demonstrates left hip with no cellulitis or skin changes.  She has mild to moderate pain with hip range of motion with positive FADIR sign and positive Stinchfield sign.  She has no calf tenderness.  Negative Homans' sign.  Intact dorsiflexion, plantarflexion, quadricep, hamstring, hip flexion strength.  Negative straight leg raise bilaterally.  No tenderness over the trochanter bilaterally.  No tenderness over either SI joint or throughout the lumbar spine.  Specialty Comments:  No specialty comments available.  Imaging: No results found.   PMFS History: Patient Active Problem List   Diagnosis Date Noted   Dysuria 04/16/2023   Malodorous urine 04/16/2023   Diarrhea of presumed infectious origin 02/01/2023   Acute bronchitis 12/22/2022   Intractable migraine with aura without status migrainosus 08/15/2022   Acute otitis externa of right ear 07/13/2022   Traumatic hematoma of right lower leg 07/13/2022   History of total abdominal hysterectomy 07/04/2022   GAD (generalized anxiety disorder) 04/28/2022   Caregiver stress 04/28/2022   Chronic left-sided low back pain without sciatica 04/13/2022   Contact dermatitis 04/13/2022   Primary osteoarthritis of right knee 03/09/2022   Refused influenza vaccine 12/02/2021   Encounter for general adult medical examination with abnormal findings 11/30/2021   Epidermal cyst 11/30/2021   Acute frontal sinusitis 09/21/2021   Allergic rhinitis 08/29/2021   Nausea 08/27/2021   Hemorrhoids 07/07/2021   Gout 03/25/2021   Diabetes mellitus without complication (HCC) 07/14/2020   Primary osteoarthritis of right foot 05/12/2020   GERD (gastroesophageal reflux disease) 05/31/2019   Solitary kidney, acquired 05/10/2019   Carotid arterial disease (HCC) 12/11/2018   COPD, 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/01/2018   CKD (chronic kidney disease) stage 3, GFR 30-59 ml/min (HCC) 10/01/2018   Aortic valve sclerosis 10/01/2018   Hypertension  09/11/2018   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HCC)     Family History  Problem Relation Age of Onset   Hypertension Mother    Heart disease Mother        Cardiac Arrest    Hypertension Father    Cancer Father    Hypertension Sister    Cancer Brother        Bladder Cancer    Hypertension Brother    Hypertension Sister     Past Surgical History:  Procedure Laterality Date   CHOLECYSTECTOMY N/A 01/28/2015   Procedure: LAPAROSCOPIC CHOLECYSTECTOMY;  Surgeon: Franky Macho Md, MD;  Location: [facility];  Service: General;  Laterality: N/A;   excision of bone spurs Bilateral    Big toes   renal aneurysm Right    right coil-and only has function of  left kidney   TOTAL ABDOMINAL HYSTERECTOMY     X-STOP IMPLANTATION     Social History   Occupational History   Occupation: Retired  Tobacco Use   Smoking status: Former    Current packs/day: 0.00    Average packs/day: 0.3 packs/day for 20.0 years (5.0 ttl pk-yrs)    Types: Cigarettes    Start date: 1999    Quit date: 2019    Years since quitting: 5.5   Smokeless tobacco: Never  Vaping Use   Vaping status: Never Used  Substance and Sexual Activity   Alcohol use: Not Currently    Comment: occasionally   Drug use: No   Sexual activity: Yes    Birth control/protection: Surgical

## 2023-05-02 ENCOUNTER — Ambulatory Visit
Admission: EM | Admit: 2023-05-02 | Discharge: 2023-05-02 | Disposition: A | Discharge status: Home / Self Care | Payer: Medicare Other | Attending: Nurse Practitioner | Admitting: Nurse Practitioner

## 2023-05-02 DIAGNOSIS — R001 Bradycardia, unspecified: Secondary | ICD-10-CM

## 2023-05-02 DIAGNOSIS — M79652 Pain in left thigh: Secondary | ICD-10-CM | POA: Diagnosis not present

## 2023-05-02 MED ORDER — TIZANIDINE HCL 4 MG PO TABS: 4.0000 mg | ORAL_TABLET | Freq: Three times a day (TID) | ORAL | 0 refills | Status: DC | PRN

## 2023-05-02 NOTE — ED Triage Notes (Signed)
Pt c/o leg pain in left leg, stems from the buttocks radiating down into the leg pt states she does a lot of walking up and down steps during the day at work. notices the pain increases when doing so x 2 months, pt states she received, a shot for this issue last week.

## 2023-05-02 NOTE — ED Provider Notes (Signed)
[facility]    CSN: [id number] Arrival date & time: 05/02/23  1145      History   Chief Complaint No chief complaint on file.   HPI Jaclyn Brooks is a 62 y.o. female.   Patient presents today with ongoing left lower extremity "burning".  She reports she only has pain/burning in the upper left thigh on the front side and it radiates down from her left hip.  Reports she is seeing an orthopedic provider regarding this, received an injection about a week ago with no improvement.  No new injury to the hip or thigh.  Reports pain worsens after working, walks up and down steps and goes up and down ladders a lot at work.  Takes Tylenol for the pain which helps dull it temporarily.  No lower extremity weakness or swelling.  No numbness or tingling in the toes.  No saddle anesthesia or new urinary symptoms.  Heart rate is noted to be low today.  Patient reports she takes atenolol for her heart.  No chest pain, shortness of breath, trouble breathing, or feeling of heart palpitations.  Reports she has a cardiologist she can follow-up with.    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[facility])     Patient Active Problem List   Diagnosis Date Noted   Dysuria 04/16/2023   Malodorous urine 04/16/2023   Diarrhea of presumed infectious origin 02/01/2023   Acute bronchitis 12/22/2022   Intractable migraine with aura without status migrainosus 08/15/2022   Acute otitis externa of right ear 07/13/2022   Traumatic hematoma of right lower leg 07/13/2022   History of total abdominal hysterectomy 07/04/2022   GAD (generalized anxiety disorder) 04/28/2022   Caregiver stress 04/28/2022   Chronic left-sided low back pain without sciatica 04/13/2022   Contact dermatitis 04/13/2022   Primary osteoarthritis of right knee  03/09/2022   Refused influenza vaccine 12/02/2021   Encounter for general adult medical examination with abnormal findings 11/30/2021   Epidermal cyst 11/30/2021   Acute frontal sinusitis 09/21/2021   Allergic rhinitis 08/29/2021   Nausea 08/27/2021   Hemorrhoids 07/07/2021   Gout 03/25/2021   Diabetes mellitus without complication (HCC) 07/14/2020   Primary osteoarthritis of right foot 05/12/2020   GERD (gastroesophageal reflux disease) 05/31/2019   Solitary kidney, acquired 05/10/2019   Carotid arterial disease (HCC) 12/11/2018   COPD, 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/01/2018   CKD (chronic kidney disease) stage 3, GFR 30-59 ml/min (HCC) 10/01/2018   Aortic valve sclerosis 10/01/2018   Hypertension 09/11/2018    Past Surgical History:  Procedure Laterality Date   CHOLECYSTECTOMY N/A 01/28/2015   Procedure: LAPAROSCOPIC CHOLECYSTECTOMY;  Surgeon: Franky Macho Md, MD;  Location: [facility];  Service: General;  Laterality: N/A;   excision of bone spurs Bilateral    Big toes   renal aneurysm Right    right coil-and only has function of left kidney   TOTAL ABDOMINAL HYSTERECTOMY     X-STOP IMPLANTATION      OB History     Gravida  1   Para  1   Term      Preterm  1   AB      Living         SAB  IAB      Ectopic      Multiple      Live Births               Home Medications    Prior to Admission medications   Medication Sig Start Date End Date Taking? Authorizing Provider  tiZANidine (ZANAFLEX) 4 MG tablet Take 1 tablet (4 mg total) by mouth every 8 (eight) hours as needed for muscle spasms. Do not take with alcohol or while driving or operating heavy machinery.  May cause drowsiness. 05/02/23  Yes Valentino Nose, NP  albuterol (VENTOLIN HFA) 108 (90 Base) MCG/ACT inhaler Inhale 1-2 puffs into the lungs every 6 (six) hours as needed for wheezing or shortness of breath. 12/22/22   Anabel Halon, MD  allopurinol (ZYLOPRIM) 100 MG tablet Take 2 tablets by mouth once  daily 04/12/23   Anabel Halon, MD  atenolol (TENORMIN) 50 MG tablet Take 1 tablet by mouth once daily 02/22/23   Anabel Halon, MD  budesonide-formoterol [facility]) 80-4.5 MCG/ACT inhaler Inhale 2 puffs into the lungs 2 (two) times daily. 10/21/21   Anabel Halon, MD  busPIRone (BUSPAR) 5 MG tablet Take 1 tablet (5 mg total) by mouth 2 (two) times daily. 07/20/22   Anabel Halon, MD  butalbital-acetaminophen-caffeine (FIORICET) [phone] MG tablet Take 1 tablet by mouth every 12 (twelve) hours as needed for headache or migraine. 08/15/22   Patel, Earlie Lou, MD  CARTIA XT 240 MG 24 hr capsule Take 1 capsule (240 mg total) by mouth daily. 11/24/22   Anabel Halon, MD  clopidogrel (PLAVIX) 75 MG tablet Take 1 tablet by mouth in the evening 03/02/23   Anabel Halon, MD  dexlansoprazole (DEXILANT) 60 MG capsule Take 1 capsule (60 mg total) by mouth daily. 11/24/22   Anabel Halon, MD  guaiFENesin-codeine (CHERATUSSIN AC) 100-10 MG/5ML syrup Take 5 mLs by mouth 3 (three) times daily as needed for cough. 01/13/23   Anabel Halon, MD  hydrochlorothiazide (HYDRODIURIL) 12.5 MG tablet Take 1 tablet (12.5 mg total) by mouth daily. 03/17/23   Anabel Halon, MD  metoCLOPramide (REGLAN) 5 MG tablet Take 1 tablet (5 mg total) by mouth every 8 (eight) hours as needed for nausea. 11/24/22   Anabel Halon, MD  montelukast (SINGULAIR) 10 MG tablet Take 1 tablet (10 mg total) by mouth at bedtime. 02/01/23   Anabel Halon, MD  atorvastatin (LIPITOR) 20 MG tablet Take 1 tablet (20 mg total) by mouth at bedtime. 11/25/19 02/18/20  Salley Scarlet, MD    Family History Family History  Problem Relation Age of Onset   Hypertension Mother    Heart disease Mother        Cardiac Arrest    Hypertension Father    Cancer Father    Hypertension Sister    Cancer Brother        Bladder Cancer    Hypertension Brother    Hypertension Sister     Social History Social History   Tobacco Use   Smoking status:  Former    Current packs/day: 0.00    Average packs/day: 0.3 packs/day for 20.0 years (5.0 ttl pk-yrs)    Types: Cigarettes    Start date: 1999    Quit date: 2019    Years since quitting: 5.5   Smokeless tobacco: Never  Vaping Use   Vaping status: Never Used  Substance Use Topics   Alcohol use: Not Currently  Comment: occasionally   Drug use: No     Allergies   Bee venom, Nsaids, and Shellfish allergy   Review of Systems Review of Systems Per HPI  Physical Exam Triage Vital Signs ED Triage Vitals  Encounter Vitals Group     BP 05/02/23 1253 132/67     Systolic BP Percentile --      Diastolic BP Percentile --      Pulse Rate 05/02/23 1253 (!) 48     Resp 05/02/23 1253 13     Temp 05/02/23 1253 98.4 F (36.9 C)     Temp Source 05/02/23 1253 Oral     SpO2 05/02/23 1253 98 %     Weight --      Height --      Head Circumference --      Peak Flow --      Pain Score 05/02/23 1255 8     Pain Loc --      Pain Education --      Exclude from Growth Chart --    No data found.  Updated Vital Signs BP 132/67 (BP Location: Right Arm)   Pulse (!) 52   Temp 98.4 F (36.9 C) (Oral)   Resp 13   SpO2 98%   Visual Acuity Right Eye Distance:   Left Eye Distance:   Bilateral Distance:    Right Eye Near:   Left Eye Near:    Bilateral Near:     Physical Exam Vitals and nursing note reviewed.  Constitutional:      General: She is not in acute distress.    Appearance: Normal appearance. She is not toxic-appearing.  HENT:     Mouth/Throat:     Mouth: Mucous membranes are moist.     Pharynx: Oropharynx is clear.  Cardiovascular:     Rate and Rhythm: Regular rhythm. Bradycardia present.  Pulmonary:     Effort: Pulmonary effort is normal. No respiratory distress.  Musculoskeletal:     Right lower leg: No edema.     Left lower leg: No edema.     Comments: Inspection: no swelling, bruising, obvious deformity or redness to left thigh Palpation: Left thigh nontender  to palpation; no obvious deformities palpated ROM: Full ROM to bilateral lower extremities Strength: 5/5 bilateral lower extremities Neurovascular: neurovascularly intact in bilateral lower extremities  Skin:    General: Skin is warm and dry.     Capillary Refill: Capillary refill takes less than 2 seconds.     Coloration: Skin is not jaundiced or pale.     Findings: No erythema.  Neurological:     Mental Status: She is alert and oriented to person, place, and time.  Psychiatric:        Behavior: Behavior is cooperative.      UC Treatments / Results  Labs (all labs ordered are listed, but only abnormal results are displayed) Labs Reviewed - No data to display  EKG   Radiology No results found.  Procedures Procedures (including critical care time)  Medications Ordered in UC Medications - No data to display  Initial Impression / Assessment and Plan / UC Course  I have reviewed the triage vital signs and the nursing notes.  Pertinent labs & imaging results that were available during my care of the patient were reviewed by me and considered in my medical decision making (see chart for details).   Patient is well-appearing, normotensive, afebrile, not tachypneic, oxygenating well on room air.  Patient is bradycardic in  triage, at last visit, heart rate was in the mid 50s.  1. Left thigh pain Query neuropathic cause, recommended follow-up with Ortho Start muscle relaxant in the meantime given no improvement with steroid injection  2. Bradycardia Patient declines EKG today we discussed risks including heart block, heart going into an irregular heart rhythm and patient continues to decline further workup Reports she will reach out to her cardiologist No red flag signs or symptoms today  The patient was given the opportunity to ask questions.  All questions answered to their satisfaction.  The patient is in agreement to this plan.   Final Clinical Impressions(s) / UC  Diagnoses   Final diagnoses:  Left thigh pain  Bradycardia     Discharge Instructions      I am not sure what is causing the pain in your left thigh.  You can take the tizanidine as needed for muscular pain.  Recommend following up with orthopedic provider for clear cause of thigh pain.  Your heart rate was low today and you did not want [place] to do an EKG.  When we rechecked your heart rate, it was in the low 50s.  If you have chest pain, difficulty breathing, lightheadedness or dizziness, please seek emergent care.  Follow-up with your cardiologist for low heart rate continues since you are on a beta-blocker medicine.    ED Prescriptions     Medication Sig Dispense Auth. Provider   tiZANidine (ZANAFLEX) 4 MG tablet Take 1 tablet (4 mg total) by mouth every 8 (eight) hours as needed for muscle spasms. Do not take with alcohol or while driving or operating heavy machinery.  May cause drowsiness. 30 tablet Valentino Nose, NP      PDMP not reviewed this encounter.   Valentino Nose, NP 05/02/23 [phone]

## 2023-05-02 NOTE — Discharge Instructions (Addendum)
I am not sure what is causing the pain in your left thigh.  You can take the tizanidine as needed for muscular pain.  Recommend following up with orthopedic provider for clear cause of thigh pain.  Your heart rate was low today and you did not want [place] to do an EKG.  When we rechecked your heart rate, it was in the low 50s.  If you have chest pain, difficulty breathing, lightheadedness or dizziness, please seek emergent care.  Follow-up with your cardiologist for low heart rate continues since you are on a beta-blocker medicine.

## 2023-05-05 ENCOUNTER — Encounter: Payer: Self-pay | Admitting: Internal Medicine

## 2023-05-05 ENCOUNTER — Ambulatory Visit (INDEPENDENT_AMBULATORY_CARE_PROVIDER_SITE_OTHER): Payer: Medicare Other | Admitting: Internal Medicine

## 2023-05-05 VITALS — BP 146/76 | HR 53 | Ht 66.0 in | Wt 177.0 lb

## 2023-05-05 DIAGNOSIS — E1169 Type 2 diabetes mellitus with other specified complication: Secondary | ICD-10-CM | POA: Diagnosis not present

## 2023-05-05 DIAGNOSIS — N1832 Chronic kidney disease, stage 3b: Secondary | ICD-10-CM | POA: Diagnosis not present

## 2023-05-05 DIAGNOSIS — I10 Essential (primary) hypertension: Secondary | ICD-10-CM | POA: Diagnosis not present

## 2023-05-05 DIAGNOSIS — M1612 Unilateral primary osteoarthritis, left hip: Secondary | ICD-10-CM

## 2023-05-05 MED ORDER — HYDROCHLOROTHIAZIDE 25 MG PO TABS: 25.0000 mg | ORAL_TABLET | Freq: Every day | ORAL | 1 refills | Status: DC

## 2023-05-05 MED ORDER — ATENOLOL 50 MG PO TABS: 50.0000 mg | ORAL_TABLET | Freq: Every day | ORAL | 1 refills | Status: DC

## 2023-05-05 NOTE — Patient Instructions (Addendum)
Please start taking 2 tablets of hydrochlorothiazide 12.5 mg for now until you complete those. Then, start taking 1 tablet of hydrochlorothiazide 25 mg once daily.  Please continue taking Atenolol and Cartia as prescribed.  Please continue to follow low salt diet and cut down soft drink intake.  Please get blood tests done after 2 weeks.

## 2023-05-05 NOTE — Assessment & Plan Note (Signed)
Need to check BMP, but she wants to get it done at her Nephrology clinic Follows up with Nephrologist - Dr. Justin Mend Avoid nephrotoxic agents

## 2023-05-05 NOTE — Assessment & Plan Note (Addendum)
BP Readings from Last 1 Encounters:  05/05/23 (!) 146/76   Recently elevated Usually well-controlled with Atenolol, Cartia and HCTZ Increased dose of HCTZ to 25 mg QD Needs to cut down soft drink intake Counseled for compliance with the medications Advised DASH diet and moderate exercise/walking, at least 150 mins/week

## 2023-05-05 NOTE — Progress Notes (Signed)
Established Patient Office Visit  Subjective:  Patient ID: Jaclyn Brooks, female    DOB: 02/26/1961  Age: 62 y.o. MRN: [id number]  CC:  Chief Complaint  Patient presents with   FMLA    Patient is needing FMLA paperwork revised     HPI Jaclyn Brooks is a 62 y.o. female with past medical history of HTN, carotid artery stenosis, COPD, GERD, diet controlled diabetes, OA, CKD stage 3b and renal artery aneurysm who presents for f/u of her HTN and recent left thigh pain episodes.  HTN: Her blood pressure has been elevated for the last 4 weeks.  She has had episodes of generalized headache when her BP goes above 150s/80s and has to leave work. FMLA paperwork was submitted, but was sent back for some inquiries, which has been revised and provided again.  She denies any headache, dizziness, chest pain, dyspnea or palpitations currently.  She is currently taking atenolol 50 mg QD and Cartia 240 mg QD.  Her heart rate remains in 50s.  CKD: She follows up with [facility].  Denies any dysuria, hematuria or urinary hesitancy or resistance currently.  She admits that she takes few [employer] every day.  She recently had left thigh area pain/burning and was evaluated by orthopedic surgeon.  She was told of left hip arthritis and had steroid injection, which has helped her with the pain.    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[facility])     Past Surgical History:  Procedure Laterality Date   CHOLECYSTECTOMY N/A 01/28/2015   Procedure: LAPAROSCOPIC CHOLECYSTECTOMY;  Surgeon: Franky Macho Md, MD;  Location: [facility];  Service: General;  Laterality: N/A;   excision of bone spurs Bilateral    Big toes   renal aneurysm Right    right coil-and only has function of left kidney   TOTAL ABDOMINAL HYSTERECTOMY      X-STOP IMPLANTATION      Family History  Problem Relation Age of Onset   Hypertension Mother    Heart disease Mother        Cardiac Arrest    Hypertension Father    Cancer Father    Hypertension Sister    Cancer Brother        Bladder Cancer    Hypertension Brother    Hypertension Sister     Social History   Socioeconomic History   Marital status: Unknown    Spouse name: Not on file   Number of children: 1   Years of education: 12   Highest education level: Some college, no degree  Occupational History   Occupation: Retired  Tobacco Use   Smoking status: Former    Current packs/day: 0.00    Average packs/day: 0.3 packs/day for 20.0 years (5.0 ttl pk-yrs)    Types: Cigarettes    Start date: 1999    Quit date: 2019    Years since quitting: 5.5   Smokeless tobacco: Never  Vaping Use   Vaping status: Never Used  Substance and Sexual Activity   Alcohol use: Not Currently    Comment: occasionally   Drug use: No   Sexual activity: Yes    Birth control/protection: Surgical  Other Topics Concern   Not on file  Social History Narrative   Not on file   Social Determinants of Health   Financial Resource Strain: Low Risk  (10/19/2021)   Overall Financial Resource Strain (CARDIA)    Difficulty of Paying Living Expenses: Not hard at all  Food Insecurity: No Food Insecurity (10/19/2021)   Hunger Vital Sign    Worried About Running Out of Food in the Last Year: Never true    Ran Out of Food in the Last Year: Never true  Transportation Needs: No Transportation Needs (10/19/2021)   PRAPARE - Administrator, Civil Service (Medical): No    Lack of Transportation (Non-Medical): No  Physical Activity: Sufficiently Active (10/19/2021)   Exercise Vital Sign    Days of Exercise per Week: 5 days    Minutes of Exercise per Session: 30 min  Stress: No Stress Concern Present (10/19/2021)   [employer] of Occupational Health - Occupational Stress Questionnaire     Feeling of Stress : Not at all  Social Connections: Moderately Isolated (10/19/2021)   Social Connection and Isolation Panel [NHANES]    Frequency of Communication with Friends and Family: More than three times a week    Frequency of Social Gatherings with Friends and Family: More than three times a week    Attends Religious Services: More than 4 times per year    Active Member of [employer] or Organizations: No    Attends Banker Meetings: Never    Marital Status: Never married  Intimate Partner Violence: Not At Risk (10/19/2021)   Humiliation, Afraid, Rape, and Kick questionnaire    Fear of Current or Ex-Partner: No    Emotionally Abused: No    Physically Abused: No    Sexually Abused: No    Outpatient Medications Prior to Visit  Medication Sig Dispense Refill   albuterol (VENTOLIN HFA) 108 (90 Base) MCG/ACT inhaler Inhale 1-2 puffs into the lungs every 6 (six) hours as needed for wheezing or shortness of breath. 1 each 2   allopurinol (ZYLOPRIM) 100 MG tablet Take 2 tablets by mouth once daily 60 tablet 0   budesonide-formoterol (SYMBICORT) 80-4.5 MCG/ACT inhaler Inhale 2 puffs into the lungs 2 (two) times daily. 1 each 2   busPIRone (BUSPAR) 5 MG tablet Take 1 tablet (5 mg total) by mouth 2 (two) times daily. 60 tablet 3   butalbital-acetaminophen-caffeine (FIORICET) 50-325-40 MG tablet Take 1 tablet by mouth every 12 (twelve) hours as needed for headache or migraine. 20 tablet 0   CARTIA XT 240 MG 24 hr capsule Take 1 capsule (240 mg total) by mouth daily. 90 capsule 3   clopidogrel (PLAVIX) 75 MG tablet Take 1 tablet by mouth in the evening 90 tablet 0   dexlansoprazole (DEXILANT) 60 MG capsule Take 1 capsule (60 mg total) by mouth daily. 30 capsule 2   guaiFENesin-codeine (CHERATUSSIN AC) 100-10 MG/5ML syrup Take 5 mLs by mouth 3 (three) times daily as needed for cough. 120 mL 0   metoCLOPramide (REGLAN) 5 MG tablet Take 1 tablet (5 mg total) by mouth every 8 (eight) hours as  needed for nausea. 20 tablet 1   montelukast (SINGULAIR) 10 MG tablet Take 1 tablet (10 mg total) by mouth at bedtime. 30 tablet 3   tiZANidine (ZANAFLEX) 4 MG tablet Take 1 tablet (4 mg total) by mouth every 8 (eight) hours as needed for muscle spasms. Do not take with alcohol or while driving or operating heavy machinery.  May cause drowsiness. 30 tablet 0  atenolol (TENORMIN) 50 MG tablet Take 1 tablet by mouth once daily 90 tablet 0   hydrochlorothiazide (HYDRODIURIL) 12.5 MG tablet Take 1 tablet (12.5 mg total) by mouth daily. 90 tablet 0   No facility-administered medications prior to visit.    Allergies  Allergen Reactions   Bee Venom Hives   Nsaids     Kidney disease    Shellfish Allergy Itching    ROS Review of Systems  Constitutional:  Negative for chills and fever.  HENT:  Negative for sinus pressure and sinus pain.   Eyes:  Negative for pain and discharge.  Respiratory:  Negative for cough and shortness of breath.   Cardiovascular:  Negative for chest pain and palpitations.  Gastrointestinal:  Negative for abdominal pain, diarrhea and vomiting.  Endocrine: Negative for polydipsia and polyuria.  Genitourinary:  Negative for dysuria and hematuria.  Musculoskeletal:  Positive for arthralgias, back pain and neck pain. Negative for neck stiffness.  Skin:  Negative for rash.  Neurological:  Negative for dizziness and weakness.  Psychiatric/Behavioral:  Positive for agitation. Negative for behavioral problems. The patient is nervous/anxious.       Objective:    Physical Exam Vitals reviewed.  Constitutional:      General: She is not in acute distress.    Appearance: She is not diaphoretic.  HENT:     Head: Normocephalic and atraumatic.     Mouth/Throat:     Mouth: Mucous membranes are moist.  Eyes:     General: No scleral icterus.    Extraocular Movements: Extraocular movements intact.  Cardiovascular:     Rate and Rhythm: Normal rate and regular rhythm.      Pulses: Normal pulses.     Heart sounds: Normal heart sounds. No murmur heard. Pulmonary:     Breath sounds: Normal breath sounds. No wheezing or rales.  Musculoskeletal:     Cervical back: Neck supple. No tenderness.     Right lower leg: No edema.     Left lower leg: No edema.  Skin:    General: Skin is warm.     Findings: No rash.  Neurological:     General: No focal deficit present.     Mental Status: She is alert and oriented to person, place, and time.  Psychiatric:        Mood and Affect: Mood normal.        Behavior: Behavior normal.     BP (!) 146/76 (BP Location: Left Arm)   Pulse (!) 53   Ht 5\' 6"  (1.676 m)   Wt 177 lb (80.3 kg)   SpO2 98%   BMI 28.57 kg/m  Wt Readings from Last 3 Encounters:  05/05/23 177 lb (80.3 kg)  04/10/23 177 lb (80.3 kg)  04/06/23 179 lb (81.2 kg)    Lab Results  Component Value Date   TSH 2.43 06/19/2020   Lab Results  Component Value Date   WBC 8.4 01/15/2021   HGB 13.3 01/15/2021   HCT 39.3 01/15/2021   MCV 96.1 01/15/2021   PLT 313 01/15/2021   Lab Results  Component Value Date   NA 137 03/04/2021   K CANCELED 03/04/2021   CO2 24 03/04/2021   GLUCOSE 132 (H) 03/04/2021   BUN 32 (H) 03/04/2021   CREATININE 1.34 (H) 03/04/2021   BILITOT CANCELED 03/04/2021   ALKPHOS 60 01/26/2015   AST CANCELED 03/04/2021   ALT 30 (H) 03/04/2021   PROT 8.1 03/04/2021   ALBUMIN 4.0 01/26/2015   CALCIUM  10.5 (H) 03/04/2021   ANIONGAP 10 01/26/2015   EGFR 44.0 03/21/2023   Lab Results  Component Value Date   CHOL 157 06/19/2020   Lab Results  Component Value Date   HDL 24 (L) 06/19/2020   Lab Results  Component Value Date   LDLCALC 92 06/19/2020   Lab Results  Component Value Date   TRIG 287 (H) 06/19/2020   Lab Results  Component Value Date   CHOLHDL 6.5 (H) 06/19/2020   Lab Results  Component Value Date   HGBA1C 5.7 07/13/2022   HGBA1C 5.7 07/13/2022   HGBA1C 5.7 07/13/2022      Assessment & Plan:    Problem List Items Addressed This Visit       Cardiovascular and Mediastinum   Hypertension - Primary    BP Readings from Last 1 Encounters:  05/05/23 (!) 146/76   Recently elevated Usually well-controlled with Atenolol, Cartia and HCTZ Increased dose of HCTZ to 25 mg QD Needs to cut down soft drink intake Counseled for compliance with the medications Advised DASH diet and moderate exercise/walking, at least 150 mins/week      Relevant Medications   hydrochlorothiazide (HYDRODIURIL) 25 MG tablet   atenolol (TENORMIN) 50 MG tablet   Other Relevant Orders   Basic Metabolic Panel (BMET)     Endocrine   Type 2 diabetes mellitus with other specified complication (HCC)    Lab Results  Component Value Date   HGBA1C 5.7 07/13/2022   HGBA1C 5.7 07/13/2022   HGBA1C 5.7 07/13/2022   Diet controlled Associated with HTN, HLD and CKD Not on statin, does not prefer to take any new medicine      Relevant Orders   Hemoglobin A1C     Musculoskeletal and Integument   Primary osteoarthritis of left hip    Recently had steroid injection in the left hip, pain improved now        Genitourinary   CKD (chronic kidney disease) stage 3, GFR 30-59 ml/min (HCC)    Need to check BMP, but she wants to get it done at her Nephrology clinic Follows up with Nephrologist - Dr. Hyman Hopes Avoid nephrotoxic agents       Meds ordered this encounter  Medications   hydrochlorothiazide (HYDRODIURIL) 25 MG tablet    Sig: Take 1 tablet (25 mg total) by mouth daily.    Dispense:  90 tablet    Refill:  1   atenolol (TENORMIN) 50 MG tablet    Sig: Take 1 tablet (50 mg total) by mouth daily.    Dispense:  90 tablet    Refill:  1    Follow-up: Return if symptoms worsen or fail to improve.    Anabel Halon, MD

## 2023-05-05 NOTE — Assessment & Plan Note (Signed)
Lab Results  Component Value Date   HGBA1C 5.7 07/13/2022   HGBA1C 5.7 07/13/2022   HGBA1C 5.7 07/13/2022   Diet controlled Associated with HTN, HLD and CKD Not on statin, does not prefer to take any new medicine

## 2023-05-05 NOTE — Assessment & Plan Note (Signed)
Recently had steroid injection in the left hip, pain improved now

## 2023-06-05 ENCOUNTER — Ambulatory Visit
Admission: EM | Admit: 2023-06-05 | Discharge: 2023-06-05 | Disposition: A | Discharge status: Home / Self Care | Payer: Medicare Other | Attending: Nurse Practitioner | Admitting: Nurse Practitioner

## 2023-06-05 DIAGNOSIS — R110 Nausea: Secondary | ICD-10-CM | POA: Diagnosis not present

## 2023-06-05 DIAGNOSIS — J069 Acute upper respiratory infection, unspecified: Secondary | ICD-10-CM | POA: Diagnosis not present

## 2023-06-05 DIAGNOSIS — Z1152 Encounter for screening for COVID-19: Secondary | ICD-10-CM | POA: Diagnosis not present

## 2023-06-05 MED ORDER — ONDANSETRON 4 MG PO TBDP: 4.0000 mg | ORAL_TABLET | Freq: Three times a day (TID) | ORAL | 0 refills | Status: DC | PRN

## 2023-06-05 NOTE — Discharge Instructions (Signed)
COVID test is pending.  You will be contacted if your pending test results are abnormal.  As discussed you will also be able to see your results using your MyChart account. Take medication as prescribed. Increase fluids and allow for plenty of rest. Recommend normal saline nasal spray throughout the day to help with nasal congestion and runny nose.  You can also use the Flonase that you have at home. If your pending test results are negative and you continue to experience symptoms after 10 days or more, please follow-up in this clinic or with your primary care physician for further evaluation. Follow-up as needed.

## 2023-06-05 NOTE — ED Provider Notes (Signed)
[facility]    CSN: [id number] Arrival date & time: 06/05/23  1009      History   Chief Complaint Chief Complaint  Patient presents with   Chills    HPI Jaclyn Brooks is a 62 y.o. female.   The history is provided by the patient.   Patient presents for complaints of fatigue, chills, body aches, and nausea that started over the past 24 hours.  She denies fever, headache, sore throat, ear pain, cough, chest pain, abdominal pain, vomiting, or diarrhea.  Patient reports she was exposed to someone 2 days ago with COVID, she also states that on her job, she is also had close COVID exposures.  Patient reports that she has been taking Tylenol and Robitussin for her symptoms.  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[facility])     Patient Active Problem List   Diagnosis Date Noted   Primary osteoarthritis of left hip 05/05/2023   Dysuria 04/16/2023   Malodorous urine 04/16/2023   Diarrhea of presumed infectious origin 02/01/2023   Acute bronchitis 12/22/2022   Intractable migraine with aura without status migrainosus 08/15/2022   Acute otitis externa of right ear 07/13/2022   Traumatic hematoma of right lower leg 07/13/2022   History of total abdominal hysterectomy 07/04/2022   GAD (generalized anxiety disorder) 04/28/2022   Caregiver stress 04/28/2022   Chronic left-sided low back pain without sciatica 04/13/2022   Contact dermatitis 04/13/2022   Primary osteoarthritis of right knee 03/09/2022   Refused influenza vaccine 12/02/2021   Encounter for general adult medical examination with abnormal findings 11/30/2021   Epidermal cyst 11/30/2021   Acute frontal sinusitis 09/21/2021   Allergic rhinitis 08/29/2021   Nausea 08/27/2021   Hemorrhoids 07/07/2021   Gout 03/25/2021   Type 2 diabetes  mellitus with other specified complication (HCC) 07/14/2020   Primary osteoarthritis of right foot 05/12/2020   GERD (gastroesophageal reflux disease) 05/31/2019   Solitary kidney, acquired 05/10/2019   Carotid arterial disease (HCC) 12/11/2018   COPD, 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/01/2018   CKD (chronic kidney disease) stage 3, GFR 30-59 ml/min (HCC) 10/01/2018   Aortic valve sclerosis 10/01/2018   Hypertension 09/11/2018    Past Surgical History:  Procedure Laterality Date   CHOLECYSTECTOMY N/A 01/28/2015   Procedure: LAPAROSCOPIC CHOLECYSTECTOMY;  Surgeon: Franky Macho Md, MD;  Location: [facility];  Service: General;  Laterality: N/A;   excision of bone spurs Bilateral    Big toes   renal aneurysm Right    right coil-and only has function of left kidney   TOTAL ABDOMINAL HYSTERECTOMY     X-STOP IMPLANTATION      OB History     Gravida  1   Para  1   Term      Preterm  1   AB      Living         SAB      IAB      Ectopic      Multiple      Live Births               Home Medications    Prior to Admission medications   Medication Sig Start Date End Date Taking? Authorizing Provider  allopurinol (ZYLOPRIM) 100  MG tablet Take 2 tablets by mouth once daily 04/12/23  Yes Patel, Earlie Lou, MD  atenolol (TENORMIN) 50 MG tablet Take 1 tablet (50 mg total) by mouth daily. 05/05/23  Yes Anabel Halon, MD  budesonide-formoterol [facility]) 80-4.5 MCG/ACT inhaler Inhale 2 puffs into the lungs 2 (two) times daily. 10/21/21  Yes Patel, Earlie Lou, MD  CARTIA XT 240 MG 24 hr capsule Take 1 capsule (240 mg total) by mouth daily. 11/24/22  Yes Anabel Halon, MD  clopidogrel (PLAVIX) 75 MG tablet Take 1 tablet by mouth in the evening 03/02/23  Yes Anabel Halon, MD  dexlansoprazole (DEXILANT) 60 MG capsule Take 1 capsule (60 mg total) by mouth daily. 11/24/22  Yes Anabel Halon, MD  hydrochlorothiazide (HYDRODIURIL) 25 MG tablet Take 1 tablet (25 mg total) by mouth daily. 05/05/23  Yes  Anabel Halon, MD  ondansetron (ZOFRAN-ODT) 4 MG disintegrating tablet Take 1 tablet (4 mg total) by mouth every 8 (eight) hours as needed. 06/05/23  Yes Reinhardt Licausi-Warren, Sadie Haber, NP  tiZANidine (ZANAFLEX) 4 MG tablet Take 1 tablet (4 mg total) by mouth every 8 (eight) hours as needed for muscle spasms. Do not take with alcohol or while driving or operating heavy machinery.  May cause drowsiness. 05/02/23  Yes Valentino Nose, NP  albuterol (VENTOLIN HFA) 108 (90 Base) MCG/ACT inhaler Inhale 1-2 puffs into the lungs every 6 (six) hours as needed for wheezing or shortness of breath. 12/22/22   Anabel Halon, MD  busPIRone (BUSPAR) 5 MG tablet Take 1 tablet (5 mg total) by mouth 2 (two) times daily. 07/20/22   Anabel Halon, MD  butalbital-acetaminophen-caffeine (FIORICET) [id number] MG tablet Take 1 tablet by mouth every 12 (twelve) hours as needed for headache or migraine. 08/15/22   Anabel Halon, MD  guaiFENesin-codeine (CHERATUSSIN AC) 100-10 MG/5ML syrup Take 5 mLs by mouth 3 (three) times daily as needed for cough. 01/13/23   Anabel Halon, MD  metoCLOPramide (REGLAN) 5 MG tablet Take 1 tablet (5 mg total) by mouth every 8 (eight) hours as needed for nausea. 11/24/22   Anabel Halon, MD  montelukast (SINGULAIR) 10 MG tablet Take 1 tablet (10 mg total) by mouth at bedtime. 02/01/23   Anabel Halon, MD  atorvastatin (LIPITOR) 20 MG tablet Take 1 tablet (20 mg total) by mouth at bedtime. 11/25/19 02/18/20  Salley Scarlet, MD    Family History Family History  Problem Relation Age of Onset   Hypertension Mother    Heart disease Mother        Cardiac Arrest    Hypertension Father    Cancer Father    Hypertension Sister    Cancer Brother        Bladder Cancer    Hypertension Brother    Hypertension Sister     Social History Social History   Tobacco Use   Smoking status: Former    Current packs/day: 0.00    Average packs/day: 0.3 packs/day for 20.0 years (5.0 ttl pk-yrs)     Types: Cigarettes    Start date: 1999    Quit date: 2019    Years since quitting: 5.6   Smokeless tobacco: Never  Vaping Use   Vaping status: Never Used  Substance Use Topics   Alcohol use: Not Currently    Comment: occasionally   Drug use: No     Allergies   Bee venom, Nsaids, and Shellfish allergy   Review of Systems Review of  Systems Per HPI  Physical Exam Triage Vital Signs ED Triage Vitals  Encounter Vitals Group     BP 06/05/23 1309 (!) 147/82     Systolic BP Percentile --      Diastolic BP Percentile --      Pulse Rate 06/05/23 1309 (!) 55     Resp 06/05/23 1309 16     Temp 06/05/23 1309 98.3 F (36.8 C)     Temp Source 06/05/23 1309 Oral     SpO2 06/05/23 1309 97 %     Weight --      Height --      Head Circumference --      Peak Flow --      Pain Score 06/05/23 1310 7     Pain Loc --      Pain Education --      Exclude from Growth Chart --    No data found.  Updated Vital Signs BP (!) 147/82 (BP Location: Right Arm)   Pulse (!) 55   Temp 98.3 F (36.8 C) (Oral)   Resp 16   SpO2 97%   Visual Acuity Right Eye Distance:   Left Eye Distance:   Bilateral Distance:    Right Eye Near:   Left Eye Near:    Bilateral Near:     Physical Exam Vitals and nursing note reviewed.  Constitutional:      General: She is not in acute distress.    Appearance: Normal appearance.  HENT:     Head: Normocephalic.     Right Ear: Tympanic membrane, ear canal and external ear normal.     Left Ear: Tympanic membrane, ear canal and external ear normal.     Nose: Congestion and rhinorrhea present.     Right Turbinates: Enlarged and swollen.     Left Turbinates: Enlarged and swollen.     Right Sinus: No maxillary sinus tenderness or frontal sinus tenderness.     Left Sinus: No maxillary sinus tenderness or frontal sinus tenderness.     Mouth/Throat:     Lips: Pink.     Mouth: Mucous membranes are moist.     Pharynx: Oropharynx is clear. Uvula midline.  Posterior oropharyngeal erythema and postnasal drip present. No pharyngeal swelling, oropharyngeal exudate or uvula swelling.  Eyes:     Extraocular Movements: Extraocular movements intact.     Conjunctiva/sclera: Conjunctivae normal.     Pupils: Pupils are equal, round, and reactive to light.  Cardiovascular:     Rate and Rhythm: Normal rate and regular rhythm.     Pulses: Normal pulses.     Heart sounds: Normal heart sounds.  Pulmonary:     Effort: Pulmonary effort is normal. No respiratory distress.     Breath sounds: Normal breath sounds. No stridor. No wheezing, rhonchi or rales.  Abdominal:     General: Bowel sounds are normal.     Palpations: Abdomen is soft.     Tenderness: There is no abdominal tenderness.  Musculoskeletal:     Cervical back: Normal range of motion.  Lymphadenopathy:     Cervical: No cervical adenopathy.  Skin:    General: Skin is warm and dry.  Neurological:     General: No focal deficit present.     Mental Status: She is alert and oriented to person, place, and time.  Psychiatric:        Mood and Affect: Mood normal.        Behavior: Behavior normal.  UC Treatments / Results  Labs (all labs ordered are listed, but only abnormal results are displayed) Labs Reviewed  SARS CORONAVIRUS 2 (TAT 6-24 HRS)    EKG   Radiology No results found.  Procedures Procedures (including critical care time)  Medications Ordered in UC Medications - No data to display  Initial Impression / Assessment and Plan / UC Course  I have reviewed the triage vital signs and the nursing notes.  Pertinent labs & imaging results that were available during my care of the patient were reviewed by me and considered in my medical decision making (see chart for details).  The patient is well-appearing, she is in no acute distress, vital signs are stable.  COVID test is pending.  Patient is a candidate to receive molnupiravir if her COVID test is positive.  Will treat  patient symptomatically for nausea with Zofran 4 mg ODT.  Supportive care recommendations were provided and discussed with the patient to include over-the-counter Tylenol for pain or discomfort, increasing fluids, and allowing for plenty of rest.  Patient was given indications of when follow-up may be necessary.  Patient is in agreement with this plan of care and verbalizes understanding.  All questions were answered.  Patient stable for discharge.  Work note was provided.   Final Clinical Impressions(s) / UC Diagnoses   Final diagnoses:  Viral upper respiratory tract infection with cough  Encounter for screening for COVID-19  Nausea without vomiting     Discharge Instructions      COVID test is pending.  You will be contacted if your pending test results are abnormal.  As discussed you will also be able to see your results using your MyChart account. Take medication as prescribed. Increase fluids and allow for plenty of rest. Recommend normal saline nasal spray throughout the day to help with nasal congestion and runny nose.  You can also use the Flonase that you have at home. If your pending test results are negative and you continue to experience symptoms after 10 days or more, please follow-up in this clinic or with your primary care physician for further evaluation. Follow-up as needed.     ED Prescriptions     Medication Sig Dispense Auth. Provider   ondansetron (ZOFRAN-ODT) 4 MG disintegrating tablet Take 1 tablet (4 mg total) by mouth every 8 (eight) hours as needed. 20 tablet Amos Gaber-Warren, Sadie Haber, NP      PDMP not reviewed this encounter.   Abran Cantor, NP 06/05/23 1326

## 2023-06-05 NOTE — ED Triage Notes (Signed)
Pt states she was exposed to someone with Covid on Saturday, now having nausea, chills, body aches, that started yesterday. Taking tylenol, and robitussin.

## 2023-06-06 ENCOUNTER — Other Ambulatory Visit: Payer: Self-pay | Admitting: Internal Medicine

## 2023-06-06 DIAGNOSIS — I6523 Occlusion and stenosis of bilateral carotid arteries: Secondary | ICD-10-CM

## 2023-06-06 LAB — SARS CORONAVIRUS 2 (TAT 6-24 HRS): SARS Coronavirus 2: NEGATIVE

## 2023-06-07 ENCOUNTER — Other Ambulatory Visit: Payer: Self-pay | Admitting: Internal Medicine

## 2023-06-07 DIAGNOSIS — M1A0690 Idiopathic chronic gout, unspecified knee, without tophus (tophi): Secondary | ICD-10-CM

## 2023-06-27 ENCOUNTER — Other Ambulatory Visit: Payer: Self-pay

## 2023-06-27 ENCOUNTER — Encounter: Payer: Self-pay | Admitting: Emergency Medicine

## 2023-06-27 ENCOUNTER — Ambulatory Visit
Admission: EM | Admit: 2023-06-27 | Discharge: 2023-06-27 | Disposition: A | Discharge status: Home / Self Care | Payer: Medicare Other | Attending: Family Medicine | Admitting: Family Medicine

## 2023-06-27 DIAGNOSIS — H6991 Unspecified Eustachian tube disorder, right ear: Secondary | ICD-10-CM

## 2023-06-27 MED ORDER — PREDNISONE 50 MG PO TABS: ORAL_TABLET | ORAL | 0 refills | Status: DC

## 2023-06-27 NOTE — ED Provider Notes (Signed)
[facility]    CSN: [id number] Arrival date & time: 06/27/23  1044      History   Chief Complaint Chief Complaint  Patient presents with   Ear Pain    HPI Jaclyn Brooks is a 62 y.o. female.   Patient presenting today with several day history of right ear pain, muffled hearing.  Denies known injury, drainage, fever, chills, recent illness, headache, nausea vomiting or diarrhea.  So far not trying anything over-the-counter for symptoms other than some over-the-counter eardrops which are not helping.  Does have a history of seasonal allergies, not been taking her allergy medication the last week or so.     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[facility])     Patient Active Problem List   Diagnosis Date Noted   Primary osteoarthritis of left hip 05/05/2023   Dysuria 04/16/2023   Malodorous urine 04/16/2023   Diarrhea of presumed infectious origin 02/01/2023   Acute bronchitis 12/22/2022   Intractable migraine with aura without status migrainosus 08/15/2022   Acute otitis externa of right ear 07/13/2022   Traumatic hematoma of right lower leg 07/13/2022   History of total abdominal hysterectomy 07/04/2022   GAD (generalized anxiety disorder) 04/28/2022   Caregiver stress 04/28/2022   Chronic left-sided low back pain without sciatica 04/13/2022   Contact dermatitis 04/13/2022   Primary osteoarthritis of right knee 03/09/2022   Refused influenza vaccine 12/02/2021   Encounter for general adult medical examination with abnormal findings 11/30/2021   Epidermal cyst 11/30/2021   Acute frontal sinusitis 09/21/2021   Allergic rhinitis 08/29/2021   Nausea 08/27/2021   Hemorrhoids 07/07/2021   Gout 03/25/2021   Type 2 diabetes mellitus with other specified complication (HCC) 07/14/2020    Primary osteoarthritis of right foot 05/12/2020   GERD (gastroesophageal reflux disease) 05/31/2019   Solitary kidney, acquired 05/10/2019   Carotid arterial disease (HCC) 12/11/2018   COPD, 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/01/2018   CKD (chronic kidney disease) stage 3, GFR 30-59 ml/min (HCC) 10/01/2018   Aortic valve sclerosis 10/01/2018   Hypertension 09/11/2018    Past Surgical History:  Procedure Laterality Date   CHOLECYSTECTOMY N/A 01/28/2015   Procedure: LAPAROSCOPIC CHOLECYSTECTOMY;  Surgeon: Franky Macho Md, MD;  Location: [facility];  Service: General;  Laterality: N/A;   excision of bone spurs Bilateral    Big toes   renal aneurysm Right    right coil-and only has function of left kidney   TOTAL ABDOMINAL HYSTERECTOMY     X-STOP IMPLANTATION      OB History     Gravida  1   Para  1   Term      Preterm  1   AB      Living         SAB      IAB      Ectopic      Multiple      Live Births               Home Medications    Prior to Admission medications   Medication Sig Start Date End Date Taking? Authorizing Provider  predniSONE (DELTASONE) 50 MG tablet Take 1 tab daily with breakfast for 3 days 06/27/23  Yes Particia Nearing,  PA-C  albuterol (VENTOLIN HFA) 108 (90 Base) MCG/ACT inhaler Inhale 1-2 puffs into the lungs every 6 (six) hours as needed for wheezing or shortness of breath. 12/22/22   Anabel Halon, MD  allopurinol (ZYLOPRIM) 100 MG tablet Take 2 tablets by mouth once daily 06/07/23   Anabel Halon, MD  atenolol (TENORMIN) 50 MG tablet Take 1 tablet (50 mg total) by mouth daily. 05/05/23   Anabel Halon, MD  budesonide-formoterol (SYMBICORT) 80-4.5 MCG/ACT inhaler Inhale 2 puffs into the lungs 2 (two) times daily. 10/21/21   Anabel Halon, MD  busPIRone (BUSPAR) 5 MG tablet Take 1 tablet (5 mg total) by mouth 2 (two) times daily. 07/20/22   Anabel Halon, MD  butalbital-acetaminophen-caffeine (FIORICET) [phone] MG tablet Take 1 tablet by mouth  every 12 (twelve) hours as needed for headache or migraine. 08/15/22   Patel, Earlie Lou, MD  CARTIA XT 240 MG 24 hr capsule Take 1 capsule (240 mg total) by mouth daily. 11/24/22   Anabel Halon, MD  clopidogrel (PLAVIX) 75 MG tablet Take 1 tablet by mouth in the evening 06/07/23   Anabel Halon, MD  dexlansoprazole (DEXILANT) 60 MG capsule Take 1 capsule (60 mg total) by mouth daily. 11/24/22   Anabel Halon, MD  guaiFENesin-codeine (CHERATUSSIN AC) 100-10 MG/5ML syrup Take 5 mLs by mouth 3 (three) times daily as needed for cough. 01/13/23   Anabel Halon, MD  hydrochlorothiazide (HYDRODIURIL) 25 MG tablet Take 1 tablet (25 mg total) by mouth daily. 05/05/23   Anabel Halon, MD  metoCLOPramide (REGLAN) 5 MG tablet Take 1 tablet (5 mg total) by mouth every 8 (eight) hours as needed for nausea. 11/24/22   Anabel Halon, MD  montelukast (SINGULAIR) 10 MG tablet Take 1 tablet (10 mg total) by mouth at bedtime. 02/01/23   Anabel Halon, MD  ondansetron (ZOFRAN-ODT) 4 MG disintegrating tablet Take 1 tablet (4 mg total) by mouth every 8 (eight) hours as needed. 06/05/23   Leath-Warren, Sadie Haber, NP  tiZANidine (ZANAFLEX) 4 MG tablet Take 1 tablet (4 mg total) by mouth every 8 (eight) hours as needed for muscle spasms. Do not take with alcohol or while driving or operating heavy machinery.  May cause drowsiness. 05/02/23   Valentino Nose, NP  atorvastatin (LIPITOR) 20 MG tablet Take 1 tablet (20 mg total) by mouth at bedtime. 11/25/19 02/18/20  Salley Scarlet, MD    Family History Family History  Problem Relation Age of Onset   Hypertension Mother    Heart disease Mother        Cardiac Arrest    Hypertension Father    Cancer Father    Hypertension Sister    Cancer Brother        Bladder Cancer    Hypertension Brother    Hypertension Sister     Social History Social History   Tobacco Use   Smoking status: Former    Current packs/day: 0.00    Average packs/day: 0.3 packs/day for  20.0 years (5.0 ttl pk-yrs)    Types: Cigarettes    Start date: 1999    Quit date: 2019    Years since quitting: 5.7   Smokeless tobacco: Never  Vaping Use   Vaping status: Never Used  Substance Use Topics   Alcohol use: Not Currently    Comment: occasionally   Drug use: No     Allergies   Bee venom, Nsaids, and Shellfish allergy  Review of Systems Review of Systems HPI  Physical Exam Triage Vital Signs ED Triage Vitals  Encounter Vitals Group     BP 06/27/23 1056 (!) 142/83     Systolic BP Percentile --      Diastolic BP Percentile --      Pulse Rate 06/27/23 1056 (!) 51     Resp 06/27/23 1056 16     Temp 06/27/23 1056 98.3 F (36.8 C)     Temp Source 06/27/23 1056 Oral     SpO2 06/27/23 1056 97 %     Weight --      Height --      Head Circumference --      Peak Flow --      Pain Score 06/27/23 1103 8     Pain Loc --      Pain Education --      Exclude from Growth Chart --    No data found.  Updated Vital Signs BP (!) 142/83 (BP Location: Right Arm)   Pulse (!) 51   Temp 98.3 F (36.8 C) (Oral)   Resp 16   SpO2 97%   Visual Acuity Right Eye Distance:   Left Eye Distance:   Bilateral Distance:    Right Eye Near:   Left Eye Near:    Bilateral Near:     Physical Exam Vitals and nursing note reviewed.  Constitutional:      Appearance: Normal appearance.  HENT:     Head: Atraumatic.     Right Ear: External ear normal.     Left Ear: Tympanic membrane and external ear normal.     Ears:     Comments: Right middle ear effusion    Mouth/Throat:     Mouth: Mucous membranes are moist.     Pharynx: No posterior oropharyngeal erythema.  Eyes:     Extraocular Movements: Extraocular movements intact.     Conjunctiva/sclera: Conjunctivae normal.  Cardiovascular:     Rate and Rhythm: Normal rate and regular rhythm.     Heart sounds: Normal heart sounds.  Pulmonary:     Effort: Pulmonary effort is normal.     Breath sounds: Normal breath sounds. No  wheezing or rales.  Musculoskeletal:        General: Normal range of motion.     Cervical back: Normal range of motion and neck supple.  Skin:    General: Skin is warm and dry.  Neurological:     Mental Status: She is alert and oriented to person, place, and time.     Motor: No weakness.     Gait: Gait normal.  Psychiatric:        Mood and Affect: Mood normal.        Thought Content: Thought content normal.      UC Treatments / Results  Labs (all labs ordered are listed, but only abnormal results are displayed) Labs Reviewed - No data to display  EKG   Radiology No results found.  Procedures Procedures (including critical care time)  Medications Ordered in UC Medications - No data to display  Initial Impression / Assessment and Plan / UC Course  I have reviewed the triage vital signs and the nursing notes.  Pertinent labs & imaging results that were available during my care of the patient were reviewed by me and considered in my medical decision making (see chart for details).     Treat with short burst of prednisone, Flonase, over-the-counter decongestions and supportive home care.  Return for worsening symptoms.  Final Clinical Impressions(s) / UC Diagnoses   Final diagnoses:  Acute dysfunction of right eustachian tube   Discharge Instructions   None    ED Prescriptions     Medication Sig Dispense Auth. Provider   predniSONE (DELTASONE) 50 MG tablet Take 1 tab daily with breakfast for 3 days 3 tablet Particia Nearing, [place]      PDMP not reviewed this encounter.   Particia Nearing, [place] 06/27/23 1200

## 2023-06-27 NOTE — ED Triage Notes (Signed)
Pt reports right ear pain for last several days. Denies any known injury.

## 2023-06-28 ENCOUNTER — Encounter: Payer: Self-pay | Admitting: Pharmacist

## 2023-07-04 NOTE — Progress Notes (Unsigned)
Cardiology Office Note:  .   Date:  07/04/2023  ID:  Jaclyn Brooks, DOB 05-17-1961, MRN [id number] PCP: Anabel Halon, MD  [facility] Providers Cardiologist:  None { Click to update primary MD,subspecialty MD or APP then REFRESH:1}   History of Present Illness: Marland Kitchen   Jaclyn Brooks is a 62 y.o. female patient of Dr. Allena Katz, has hx of HTN, carotid artery stenosis, gout, COPD, DM2 CKD stage 3b, she is here as a self referral for ? , but she did not make this referral. She is working with him on FMLA paperwork for her blood pressure, notes gets to 150s/80s and she leaves work. Patient states she has no idea why she was sent here. I don't see any documentation for the reason she is here. She is followed by vascular and nephrology.   ROS:  per HPI otherwise negative   Studies Reviewed: .         Risk Assessment/Calculations:        Physical Exam:   VS:    Wt Readings from Last 3 Encounters:  05/05/23 177 lb (80.3 kg)  04/10/23 177 lb (80.3 kg)  04/06/23 179 lb (81.2 kg)    NA  ASSESSMENT AND PLAN: .   HTN - defer to nephrology  DM2 -goal A1c < 7 %  CKD stage 3b Atrophic R kidney,  hx of right renal artery embolization.mildly elevated systolic velocities in the mid L renal artery suspicious of stenosis. L renal artery aneursym -followed by nephrology and vascular  Carotid artery disease -chronic R ICA occlusion -left ICA < 50% narrowing - on plavix 75 mg daily - ? lipitor     Gave patient option to cancel visit, since she did not make the referral she decided to discuss with her PCP. She is followed by vascular sx and nephrology and has no cardiac dx.   Visit was canceled today, no charge  Signed, Shineka Auble, Alben Spittle, MD

## 2023-07-05 ENCOUNTER — Ambulatory Visit (INDEPENDENT_AMBULATORY_CARE_PROVIDER_SITE_OTHER): Payer: Medicare Other | Admitting: Internal Medicine

## 2023-07-05 ENCOUNTER — Encounter: Payer: Self-pay | Admitting: Internal Medicine

## 2023-07-05 VITALS — BP 148/80 | Ht 66.0 in | Wt 177.8 lb

## 2023-07-05 DIAGNOSIS — I358 Other nonrheumatic aortic valve disorders: Secondary | ICD-10-CM

## 2023-07-06 ENCOUNTER — Ambulatory Visit (INDEPENDENT_AMBULATORY_CARE_PROVIDER_SITE_OTHER): Payer: Medicare Other | Admitting: Internal Medicine

## 2023-07-06 ENCOUNTER — Encounter: Payer: Self-pay | Admitting: Internal Medicine

## 2023-07-06 VITALS — BP 138/78 | HR 52 | Ht 66.0 in | Wt 180.4 lb

## 2023-07-06 DIAGNOSIS — J4530 Mild persistent asthma, uncomplicated: Secondary | ICD-10-CM | POA: Diagnosis not present

## 2023-07-06 DIAGNOSIS — J45909 Unspecified asthma, uncomplicated: Secondary | ICD-10-CM | POA: Insufficient documentation

## 2023-07-06 DIAGNOSIS — R109 Unspecified abdominal pain: Secondary | ICD-10-CM

## 2023-07-06 DIAGNOSIS — N1832 Chronic kidney disease, stage 3b: Secondary | ICD-10-CM

## 2023-07-06 DIAGNOSIS — I10 Essential (primary) hypertension: Secondary | ICD-10-CM | POA: Diagnosis not present

## 2023-07-06 DIAGNOSIS — R195 Other fecal abnormalities: Secondary | ICD-10-CM

## 2023-07-06 DIAGNOSIS — Z905 Acquired absence of kidney: Secondary | ICD-10-CM | POA: Diagnosis not present

## 2023-07-06 DIAGNOSIS — J3089 Other allergic rhinitis: Secondary | ICD-10-CM | POA: Diagnosis not present

## 2023-07-06 MED ORDER — BUDESONIDE-FORMOTEROL FUMARATE 80-4.5 MCG/ACT IN AERO: 2.0000 | INHALATION_SPRAY | Freq: Two times a day (BID) | RESPIRATORY_TRACT | 2 refills | Status: DC

## 2023-07-06 MED ORDER — DICYCLOMINE HCL 10 MG PO CAPS
10.0000 mg | ORAL_CAPSULE | Freq: Three times a day (TID) | ORAL | 1 refills | Status: AC | PRN
Start: 2023-07-06 — End: ?

## 2023-07-06 MED ORDER — MONTELUKAST SODIUM 10 MG PO TABS
10.0000 mg | ORAL_TABLET | Freq: Every day | ORAL | 3 refills | Status: AC
Start: 2023-07-06 — End: ?

## 2023-07-06 NOTE — Assessment & Plan Note (Signed)
Has only 1 for functioning kidney, has  atrophy of right kidney Followed by nephrology

## 2023-07-06 NOTE — Assessment & Plan Note (Signed)
Added Singulair again She has tried Claritin, Engineer, production

## 2023-07-06 NOTE — Patient Instructions (Addendum)
Please take Dicyclomine as needed for abdominal cramps. Please try Benefiber for loose BM. Please try lactose free dairy products. Try to keep food diary.  Please start taking Montelukast for allergies.  Please continue to take medications as prescribed.  Please continue to follow low salt diet and perform moderate exercise/walking at least 150 mins/week.

## 2023-07-06 NOTE — Assessment & Plan Note (Signed)
Well-controlled with Symbicort and as needed albuterol ?

## 2023-07-06 NOTE — Assessment & Plan Note (Addendum)
Has abdominal cramping as well Bentyl as needed for abdominal cramping Advised to take Benefiber If persistent, will refer to GI

## 2023-07-06 NOTE — Progress Notes (Signed)
Established Patient Office Visit  Subjective:  Patient ID: Jaclyn Brooks, female    DOB: 03-27-1961  Age: 62 y.o. MRN: [id number]  CC:  Chief Complaint  Patient presents with   Hypertension    Three month follow up     HPI Jaclyn Brooks is a 62 y.o. female with past medical history of HTN, carotid artery stenosis, COPD, GERD, diet controlled diabetes, OA, CKD stage 3b and renal artery aneurysm who presents for f/u of her HTN.  HTN: She denies any headache, dizziness, chest pain, dyspnea or palpitations currently.  She is currently taking atenolol 50 mg QD, Cartia 240 mg QD and hydrochlorothiazide 25 mg QD.  Her heart rate remains in 50s.  She did not tolerate hydralazine that was prescribed by nephrology.  CKD: She follows up with [facility].  Denies any dysuria, hematuria or urinary hesitancy or resistance currently.  She admits that she takes few [employer] every day.  She reports right-sided abdominal pain with cramping and loose bowel movements  for the last few weeks, which is intermittent.  Denies any melena or hematochezia.  She has history of cholecystectomy.  Denies any nausea or vomiting recently.  She takes Dexilant as needed for GERD.  She still has chronic nasal congestion, postnasal drip and sinus pressure related headache.  Of note, she does not have Singulair for her allergies currently.  She has tried OTC antihistaminics without much relief.    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[facility])     Past Surgical History:  Procedure Laterality Date   CHOLECYSTECTOMY N/A 01/28/2015   Procedure: LAPAROSCOPIC CHOLECYSTECTOMY;  Surgeon: Franky Macho Md, MD;  Location: [facility];  Service: General;  Laterality: N/A;   excision of bone spurs Bilateral    Big toes   renal aneurysm  Right    right coil-and only has function of left kidney   TOTAL ABDOMINAL HYSTERECTOMY     X-STOP IMPLANTATION      Family History  Problem Relation Age of Onset   Hypertension Mother    Heart disease Mother        Cardiac Arrest    Hypertension Father    Cancer Father    Hypertension Sister    Cancer Brother        Bladder Cancer    Hypertension Brother    Hypertension Sister     Social History   Socioeconomic History   Marital status: Unknown    Spouse name: Not on file   Number of children: 1   Years of education: 12   Highest education level: Some college, no degree  Occupational History   Occupation: Retired  Tobacco Use   Smoking status: Former    Current packs/day: 0.00    Average packs/day: 0.3 packs/day for 20.0 years (5.0 ttl pk-yrs)    Types: Cigarettes    Start date: 1999    Quit date: 2019    Years since quitting: 5.7   Smokeless tobacco: Never  Vaping Use   Vaping status: Never Used  Substance and Sexual Activity   Alcohol use: Not Currently    Comment: occasionally   Drug use: No   Sexual activity: Yes    Birth control/protection: Surgical  Other  Topics Concern   Not on file  Social History Narrative   Not on file   Social Determinants of Health   Financial Resource Strain: Low Risk  (10/19/2021)   Overall Financial Resource Strain (CARDIA)    Difficulty of Paying Living Expenses: Not hard at all  Food Insecurity: No Food Insecurity (10/19/2021)   Hunger Vital Sign    Worried About Running Out of Food in the Last Year: Never true    Ran Out of Food in the Last Year: Never true  Transportation Needs: No Transportation Needs (10/19/2021)   PRAPARE - Administrator, Civil Service (Medical): No    Lack of Transportation (Non-Medical): No  Physical Activity: Sufficiently Active (10/19/2021)   Exercise Vital Sign    Days of Exercise per Week: 5 days    Minutes of Exercise per Session: 30 min  Stress: No Stress Concern Present  (10/19/2021)   [employer] of Occupational Health - Occupational Stress Questionnaire    Feeling of Stress : Not at all  Social Connections: Moderately Isolated (10/19/2021)   Social Connection and Isolation Panel [NHANES]    Frequency of Communication with Friends and Family: More than three times a week    Frequency of Social Gatherings with Friends and Family: More than three times a week    Attends Religious Services: More than 4 times per year    Active Member of [employer] or Organizations: No    Attends Banker Meetings: Never    Marital Status: Never married  Intimate Partner Violence: Not At Risk (10/19/2021)   Humiliation, Afraid, Rape, and Kick questionnaire    Fear of Current or Ex-Partner: No    Emotionally Abused: No    Physically Abused: No    Sexually Abused: No    Outpatient Medications Prior to Visit  Medication Sig Dispense Refill   albuterol (VENTOLIN HFA) 108 (90 Base) MCG/ACT inhaler Inhale 1-2 puffs into the lungs every 6 (six) hours as needed for wheezing or shortness of breath. 1 each 2   allopurinol (ZYLOPRIM) 100 MG tablet Take 2 tablets by mouth once daily 60 tablet 0   atenolol (TENORMIN) 50 MG tablet Take 1 tablet (50 mg total) by mouth daily. 90 tablet 1   busPIRone (BUSPAR) 5 MG tablet Take 1 tablet (5 mg total) by mouth 2 (two) times daily. 60 tablet 3   butalbital-acetaminophen-caffeine (FIORICET) 50-325-40 MG tablet Take 1 tablet by mouth every 12 (twelve) hours as needed for headache or migraine. 20 tablet 0   CARTIA XT 240 MG 24 hr capsule Take 1 capsule (240 mg total) by mouth daily. 90 capsule 3   clopidogrel (PLAVIX) 75 MG tablet Take 1 tablet by mouth in the evening 90 tablet 0   dexlansoprazole (DEXILANT) 60 MG capsule Take 1 capsule (60 mg total) by mouth daily. 30 capsule 2   hydrochlorothiazide (HYDRODIURIL) 25 MG tablet Take 1 tablet (25 mg total) by mouth daily. 90 tablet 1   ondansetron (ZOFRAN-ODT) 4 MG disintegrating tablet  Take 1 tablet (4 mg total) by mouth every 8 (eight) hours as needed. 20 tablet 0   tiZANidine (ZANAFLEX) 4 MG tablet Take 1 tablet (4 mg total) by mouth every 8 (eight) hours as needed for muscle spasms. Do not take with alcohol or while driving or operating heavy machinery.  May cause drowsiness. 30 tablet 0   budesonide-formoterol (SYMBICORT) 80-4.5 MCG/ACT inhaler Inhale 2 puffs into the lungs 2 (two) times daily. 1 each 2  guaiFENesin-codeine (CHERATUSSIN AC) 100-10 MG/5ML syrup Take 5 mLs by mouth 3 (three) times daily as needed for cough. 120 mL 0   metoCLOPramide (REGLAN) 5 MG tablet Take 1 tablet (5 mg total) by mouth every 8 (eight) hours as needed for nausea. 20 tablet 1   montelukast (SINGULAIR) 10 MG tablet Take 1 tablet (10 mg total) by mouth at bedtime. 30 tablet 3   predniSONE (DELTASONE) 50 MG tablet Take 1 tab daily with breakfast for 3 days 3 tablet 0   No facility-administered medications prior to visit.    Allergies  Allergen Reactions   Bee Venom Hives   Nsaids     Kidney disease    Shellfish Allergy Itching    ROS Review of Systems  Constitutional:  Negative for chills and fever.  HENT:  Positive for congestion, postnasal drip and sinus pressure. Negative for sinus pain.   Eyes:  Negative for pain and discharge.  Respiratory:  Negative for cough and shortness of breath.   Cardiovascular:  Negative for chest pain and palpitations.  Gastrointestinal:  Negative for abdominal pain, diarrhea and vomiting.  Endocrine: Negative for polydipsia and polyuria.  Genitourinary:  Negative for dysuria and hematuria.  Musculoskeletal:  Positive for arthralgias, back pain and neck pain. Negative for neck stiffness.  Skin:  Negative for rash.  Neurological:  Negative for dizziness and weakness.  Psychiatric/Behavioral:  Positive for agitation. Negative for behavioral problems. The patient is nervous/anxious.       Objective:    Physical Exam Vitals reviewed.   Constitutional:      General: She is not in acute distress.    Appearance: She is not diaphoretic.  HENT:     Head: Normocephalic and atraumatic.     Mouth/Throat:     Mouth: Mucous membranes are moist.  Eyes:     General: No scleral icterus.    Extraocular Movements: Extraocular movements intact.  Cardiovascular:     Rate and Rhythm: Normal rate and regular rhythm.     Pulses: Normal pulses.     Heart sounds: Normal heart sounds. No murmur heard. Pulmonary:     Breath sounds: Normal breath sounds. No wheezing or rales.  Musculoskeletal:     Cervical back: Neck supple. No tenderness.     Right lower leg: No edema.     Left lower leg: No edema.  Skin:    General: Skin is warm.     Findings: No rash.  Neurological:     General: No focal deficit present.     Mental Status: She is alert and oriented to person, place, and time.  Psychiatric:        Mood and Affect: Mood normal.        Behavior: Behavior normal.     BP 138/78 (BP Location: Left Arm, Patient Position: Sitting, Cuff Size: Normal)   Pulse (!) 52   Ht 5\' 6"  (1.676 m)   Wt 180 lb 6.4 oz (81.8 kg)   SpO2 96%   BMI 29.12 kg/m  Wt Readings from Last 3 Encounters:  07/06/23 180 lb 6.4 oz (81.8 kg)  07/05/23 177 lb 12.8 oz (80.6 kg)  05/05/23 177 lb (80.3 kg)    Lab Results  Component Value Date   TSH 2.43 06/19/2020   Lab Results  Component Value Date   WBC 8.4 01/15/2021   HGB 13.3 01/15/2021   HCT 39.3 01/15/2021   MCV 96.1 01/15/2021   PLT 313 01/15/2021   Lab Results  Component  Value Date   NA 137 03/04/2021   K CANCELED 03/04/2021   CO2 24 03/04/2021   GLUCOSE 132 (H) 03/04/2021   BUN 32 (H) 03/04/2021   CREATININE 1.34 (H) 03/04/2021   BILITOT CANCELED 03/04/2021   ALKPHOS 60 01/26/2015   AST CANCELED 03/04/2021   ALT 30 (H) 03/04/2021   PROT 8.1 03/04/2021   ALBUMIN 4.0 01/26/2015   CALCIUM 10.5 (H) 03/04/2021   ANIONGAP 10 01/26/2015   EGFR 44.0 03/21/2023   Lab Results   Component Value Date   CHOL 157 06/19/2020   Lab Results  Component Value Date   HDL 24 (L) 06/19/2020   Lab Results  Component Value Date   LDLCALC 92 06/19/2020   Lab Results  Component Value Date   TRIG 287 (H) 06/19/2020   Lab Results  Component Value Date   CHOLHDL 6.5 (H) 06/19/2020   Lab Results  Component Value Date   HGBA1C 5.7 07/13/2022   HGBA1C 5.7 07/13/2022   HGBA1C 5.7 07/13/2022      Assessment & Plan:   Problem List Items Addressed This Visit       Cardiovascular and Mediastinum   Hypertension - Primary    BP Readings from Last 1 Encounters:  07/06/23 138/78   Usually well-controlled with Atenolol, Cartia and HCTZ Did not tolerate Hydralazine Needs to cut down soft drink intake Counseled for compliance with the medications Advised DASH diet and moderate exercise/walking, at least 150 mins/week        Respiratory   Allergic rhinitis    Added Singulair again She has tried Claritin, Xyzal and Allegra Continue Flonase      Relevant Medications   montelukast (SINGULAIR) 10 MG tablet   Reactive airway disease    Well controlled with Symbicort and as needed albuterol      Relevant Medications   budesonide-formoterol (SYMBICORT) 80-4.5 MCG/ACT inhaler     Genitourinary   CKD (chronic kidney disease) stage 3, GFR 30-59 ml/min (HCC)    Need to check BMP, but she prefers to get it done at her Nephrology clinic Follows up with Nephrologist - Dr. Hyman Hopes Avoid nephrotoxic agents        Other   Solitary kidney, acquired    Has only 1 for functioning kidney, has  atrophy of right kidney Followed by nephrology      Loose bowel movements    Has abdominal cramping as well Bentyl as needed for abdominal cramping Advised to take Benefiber If persistent, will refer to GI      Other Visit Diagnoses     Abdominal cramps       Relevant Medications   dicyclomine (BENTYL) 10 MG capsule        Meds ordered this encounter  Medications    dicyclomine (BENTYL) 10 MG capsule    Sig: Take 1 capsule (10 mg total) by mouth 3 (three) times daily as needed for spasms.    Dispense:  30 capsule    Refill:  1   montelukast (SINGULAIR) 10 MG tablet    Sig: Take 1 tablet (10 mg total) by mouth at bedtime.    Dispense:  30 tablet    Refill:  3   budesonide-formoterol (SYMBICORT) 80-4.5 MCG/ACT inhaler    Sig: Inhale 2 puffs into the lungs 2 (two) times daily.    Dispense:  1 each    Refill:  2    Follow-up: Return in about 6 months (around 01/03/2024) for Annual physical.    Jeani Fassnacht  Concha Se, MD

## 2023-07-06 NOTE — Assessment & Plan Note (Addendum)
BP Readings from Last 1 Encounters:  07/06/23 138/78   Usually well-controlled with Atenolol, Cartia and HCTZ Did not tolerate Hydralazine Needs to cut down soft drink intake Counseled for compliance with the medications Advised DASH diet and moderate exercise/walking, at least 150 mins/week

## 2023-07-06 NOTE — Assessment & Plan Note (Addendum)
Need to check BMP, but she prefers to get it done at her Nephrology clinic Follows up with Nephrologist - Dr. Hyman Hopes Avoid nephrotoxic agents

## 2023-07-18 ENCOUNTER — Ambulatory Visit (INDEPENDENT_AMBULATORY_CARE_PROVIDER_SITE_OTHER): Payer: Medicare Other | Admitting: Family Medicine

## 2023-07-18 ENCOUNTER — Encounter: Payer: Self-pay | Admitting: Family Medicine

## 2023-07-18 ENCOUNTER — Ambulatory Visit (HOSPITAL_COMMUNITY)
Admission: RE | Admit: 2023-07-18 | Discharge: 2023-07-18 | Disposition: A | Discharge status: Home / Self Care | Payer: Medicare Other | Source: Ambulatory Visit | Attending: Family Medicine | Admitting: Family Medicine

## 2023-07-18 VITALS — BP 141/71 | HR 53 | Temp 98.5°F | Resp 17 | Ht 66.0 in | Wt 180.8 lb

## 2023-07-18 DIAGNOSIS — I10 Essential (primary) hypertension: Secondary | ICD-10-CM

## 2023-07-18 DIAGNOSIS — J209 Acute bronchitis, unspecified: Secondary | ICD-10-CM | POA: Insufficient documentation

## 2023-07-18 DIAGNOSIS — J0100 Acute maxillary sinusitis, unspecified: Secondary | ICD-10-CM | POA: Diagnosis not present

## 2023-07-18 DIAGNOSIS — R1319 Other dysphagia: Secondary | ICD-10-CM | POA: Diagnosis not present

## 2023-07-18 DIAGNOSIS — K219 Gastro-esophageal reflux disease without esophagitis: Secondary | ICD-10-CM | POA: Diagnosis not present

## 2023-07-18 DIAGNOSIS — R058 Other specified cough: Secondary | ICD-10-CM | POA: Diagnosis not present

## 2023-07-18 MED ORDER — PREDNISONE 5 MG PO TABS: 5.0000 mg | ORAL_TABLET | Freq: Two times a day (BID) | ORAL | 0 refills | Status: AC

## 2023-07-18 MED ORDER — BENZONATATE 100 MG PO CAPS: 100.0000 mg | ORAL_CAPSULE | Freq: Two times a day (BID) | ORAL | 0 refills | Status: DC | PRN

## 2023-07-18 MED ORDER — AZITHROMYCIN 250 MG PO TABS: ORAL_TABLET | ORAL | 0 refills | Status: AC

## 2023-07-18 NOTE — Patient Instructions (Addendum)
F/U with PCP in 4 to 8 weeks re evaluate chronic problems, callif you need to be seen sooner  CXR today  You need to commit to daily symbicort to improve breathing  You are being referred to GI for liquids sticking when  you try to swallow and for improved control of your reflux as effective medication is too expensive, I will also speak with your PCP about the referral prefernce/ process  Need to STOP tea, coffee, chocolate as these make reflux worse  Short course of prednisone, an antibiotic and decongestant perles are prescribed to treat acute sinusitis and bronchitis  Thanks for choosing [facility], we consider it a privelige to serve you.

## 2023-07-18 NOTE — Assessment & Plan Note (Addendum)
1 week history , pt has asthma, Predniosne x 5 days, Z pack  and tessalon perles also CXR, f/u with PCP

## 2023-07-23 ENCOUNTER — Encounter: Payer: Self-pay | Admitting: Family Medicine

## 2023-07-23 DIAGNOSIS — J0100 Acute maxillary sinusitis, unspecified: Secondary | ICD-10-CM | POA: Insufficient documentation

## 2023-07-23 DIAGNOSIS — R1310 Dysphagia, unspecified: Secondary | ICD-10-CM | POA: Insufficient documentation

## 2023-07-23 NOTE — Assessment & Plan Note (Signed)
Z pack prescribed

## 2023-07-23 NOTE — Assessment & Plan Note (Signed)
Elevated at visit , to f/u with PCP

## 2023-07-23 NOTE — Assessment & Plan Note (Signed)
Needs GI eval, will defer to PCP, will send a message, advise pt to reduce / stop caffeine intake

## 2023-07-23 NOTE — Assessment & Plan Note (Signed)
Unable to afford the PPI that does work and now c/o dysphagia for liquids will defer to PCP

## 2023-07-23 NOTE — Progress Notes (Signed)
Jaclyn Brooks     MRN: [id number]      DOB: 04-07-1961  Chief Complaint  Patient presents with   Cough    Coughing x 1 week, Coughing up thick phlegm, throat dry and scratchy, hurting across mid back. Wants something to break it up so she can cough it up because its making it hard to breathe     HPI Jaclyn Brooks is here with above stated concerns , also c/obilateral sinus pressure over cheeks with thick yellow drainage, has ahd chils , no documented fevr, feels unwell C/o liquids sticking when she  tries to swallow, states the sexilant which is effective for her reflux is too expensive to take even once daily as prescribed, caffeine intake is moderte ,  ROS  Denies chest pains, palpitations and leg swelling Denies abdominal pain, nausea, vomiting,diarrhea or constipation.   Denies dysuria, frequency, hesitancy or incontinence. Denies joint pain, swelling and limitation in mobility. Denies headaches, seizures, numbness, or tingling. Denies depression, anxiety or insomnia. Denies skin break down or rash.   PE  BP (!) 141/71   Pulse (!) 53   Temp 98.5 F (36.9 C) (Oral)   Resp 17   Ht 5\' 6"  (1.676 m)   Wt 180 lb 12.8 oz (82 kg)   SpO2 95%   BMI 29.18 kg/m   Patient alert and oriented and in no cardiopulmonary distress.  HEENT: No facial asymmetry, EOMI,     Neck supple .Bilateral max sinus tenderness  Chest: educed air entry throughout , basilar  crackles  no  wheezes.  CVS: S1, S2 no murmurs, no S3.Regular rate.  Ext: No edema  MS: Adequate ROM spine, shoulders, hips and knees.  Skin: Intact, no ulcerations or rash noted.  Psych: Good eye contact, normal affect. Memory intact not anxious or depressed appearing.  CNS: CN 2-12 intact, power,  normal throughout.no focal deficits noted.   Assessment & Plan  Acute bronchitis 1 week history , pt has asthma, Predniosne x 5 days, Z pack  and tessalon perles also CXR, f/u with PCP  Acute maxillary sinusitis Z pack   prescribed  Dysphagia Needs GI eval, will defer to PCP, will send a message, advise pt to reduce / stop caffeine intake  GERD (gastroesophageal reflux disease) Unable to afford the PPI that does work and now c/o dysphagia for liquids will defer to PCP  Hypertension Elevated at visit , to f/u with PCP

## 2023-07-28 ENCOUNTER — Other Ambulatory Visit: Payer: Self-pay | Admitting: Internal Medicine

## 2023-07-28 DIAGNOSIS — K219 Gastro-esophageal reflux disease without esophagitis: Secondary | ICD-10-CM

## 2023-08-08 ENCOUNTER — Other Ambulatory Visit: Payer: Self-pay

## 2023-08-08 ENCOUNTER — Ambulatory Visit
Admission: EM | Admit: 2023-08-08 | Discharge: 2023-08-08 | Disposition: A | Discharge status: Home / Self Care | Payer: Medicare Other | Attending: Nurse Practitioner | Admitting: Nurse Practitioner

## 2023-08-08 ENCOUNTER — Encounter: Payer: Self-pay | Admitting: Emergency Medicine

## 2023-08-08 DIAGNOSIS — J069 Acute upper respiratory infection, unspecified: Secondary | ICD-10-CM | POA: Diagnosis not present

## 2023-08-08 LAB — POC COVID19/FLU A&B COMBO
Covid Antigen, POC: NEGATIVE
Influenza A Antigen, POC: NEGATIVE
Influenza B Antigen, POC: NEGATIVE

## 2023-08-08 NOTE — ED Triage Notes (Signed)
Pt reports sore throat, headache, body aches since yesterday. Pt inquiring about covid test. Denies any known fevers.

## 2023-08-08 NOTE — Discharge Instructions (Signed)
You have a viral upper respiratory infection.  Symptoms should improve over the next week to 10 days.  If you develop chest pain or shortness of breath, go to the emergency room.  Flu and COVID-19 test are negative today.  Some things that can make you feel better are: - Increased rest - Increasing fluid with water/sugar free electrolytes - Acetaminophen and ibuprofen as needed for fever/pain - Salt water gargling, chloraseptic spray and throat lozenges - OTC guaifenesin (Mucinex) 600 mg twice daily - Saline sinus flushes or a neti pot - Humidifying the air

## 2023-08-08 NOTE — ED Provider Notes (Signed)
[facility]    CSN: [id number] Arrival date & time: 08/08/23  1340      History   Chief Complaint Chief Complaint  Patient presents with   Sore Throat    HPI Jaclyn Brooks is a 62 y.o. female.   Patient presents today with 1 day history of bodyaches, chills, runny and stuffy nose, sore throat and headache, diarrhea, and fatigue.  She denies cough, known fevers, shortness of breath or chest pain, ear pain, abdominal pain, nausea and vomiting.  No change in appetite or known sick contacts, however works at a local [employer].  Has taken Mucinex for symptoms with only little improvement.    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[facility])     Patient Active Problem List   Diagnosis Date Noted   Acute maxillary sinusitis 07/23/2023   Dysphagia 07/23/2023   Reactive airway disease 07/06/2023   Loose bowel movements 07/06/2023   Primary osteoarthritis of left hip 05/05/2023   Dysuria 04/16/2023   Diarrhea of presumed infectious origin 02/01/2023   Acute bronchitis 12/22/2022   Intractable migraine with aura without status migrainosus 08/15/2022   Acute otitis externa of right ear 07/13/2022   Traumatic hematoma of right lower leg 07/13/2022   History of total abdominal hysterectomy 07/04/2022   GAD (generalized anxiety disorder) 04/28/2022   Caregiver stress 04/28/2022   Chronic left-sided low back pain without sciatica 04/13/2022   Contact dermatitis 04/13/2022   Primary osteoarthritis of right knee 03/09/2022   Refused influenza vaccine 12/02/2021   Encounter for general adult medical examination with abnormal findings 11/30/2021   Epidermal cyst 11/30/2021   Acute frontal sinusitis 09/21/2021   Allergic rhinitis 08/29/2021   Nausea 08/27/2021   Hemorrhoids 07/07/2021   Gout  03/25/2021   Type 2 diabetes mellitus with other specified complication (HCC) 07/14/2020   Primary osteoarthritis of right foot 05/12/2020   GERD (gastroesophageal reflux disease) 05/31/2019   Solitary kidney, acquired 05/10/2019   Carotid arterial disease (HCC) 12/11/2018   COPD, 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/01/2018   CKD (chronic kidney disease) stage 3, GFR 30-59 ml/min (HCC) 10/01/2018   Aortic valve sclerosis 10/01/2018   Hypertension 09/11/2018    Past Surgical History:  Procedure Laterality Date   CHOLECYSTECTOMY N/A 01/28/2015   Procedure: LAPAROSCOPIC CHOLECYSTECTOMY;  Surgeon: Franky Macho Md, MD;  Location: [facility];  Service: General;  Laterality: N/A;   excision of bone spurs Bilateral    Big toes   renal aneurysm Right    right coil-and only has function of left kidney   TOTAL ABDOMINAL HYSTERECTOMY     X-STOP IMPLANTATION      OB History     Gravida  1   Para  1   Term      Preterm  1   AB      Living         SAB      IAB      Ectopic      Multiple      Live Births               Home Medications    Prior to Admission medications   Medication Sig Start Date End Date Taking? Authorizing Provider  albuterol (VENTOLIN HFA)  108 (90 Base) MCG/ACT inhaler Inhale 1-2 puffs into the lungs every 6 (six) hours as needed for wheezing or shortness of breath. 12/22/22   Anabel Halon, MD  allopurinol (ZYLOPRIM) 100 MG tablet Take 2 tablets by mouth once daily 06/07/23   Anabel Halon, MD  atenolol (TENORMIN) 50 MG tablet Take 1 tablet (50 mg total) by mouth daily. 05/05/23   Anabel Halon, MD  benzonatate (TESSALON) 100 MG capsule Take 1 capsule (100 mg total) by mouth 2 (two) times daily as needed for cough. 07/18/23   Kerri Perches, MD  budesonide-formoterol (SYMBICORT) 80-4.5 MCG/ACT inhaler Inhale 2 puffs into the lungs 2 (two) times daily. 07/06/23   Anabel Halon, MD  busPIRone (BUSPAR) 5 MG tablet Take 1 tablet (5 mg total) by mouth 2 (two) times  daily. 07/20/22   Anabel Halon, MD  butalbital-acetaminophen-caffeine (FIORICET) [phone] MG tablet Take 1 tablet by mouth every 12 (twelve) hours as needed for headache or migraine. 08/15/22   Patel, Earlie Lou, MD  CARTIA XT 240 MG 24 hr capsule Take 1 capsule (240 mg total) by mouth daily. 11/24/22   Anabel Halon, MD  clopidogrel (PLAVIX) 75 MG tablet Take 1 tablet by mouth in the evening 06/07/23   Anabel Halon, MD  dexlansoprazole [facility]) 60 MG capsule Take 1 capsule by mouth once daily 07/31/23   Anabel Halon, MD  dicyclomine (BENTYL) 10 MG capsule Take 1 capsule (10 mg total) by mouth 3 (three) times daily as needed for spasms. 07/06/23   Anabel Halon, MD  hydrochlorothiazide (HYDRODIURIL) 25 MG tablet Take 1 tablet (25 mg total) by mouth daily. 05/05/23   Anabel Halon, MD  montelukast (SINGULAIR) 10 MG tablet Take 1 tablet (10 mg total) by mouth at bedtime. 07/06/23   Anabel Halon, MD  ondansetron (ZOFRAN-ODT) 4 MG disintegrating tablet Take 1 tablet (4 mg total) by mouth every 8 (eight) hours as needed. 06/05/23   Leath-Warren, Sadie Haber, NP  tiZANidine (ZANAFLEX) 4 MG tablet Take 1 tablet (4 mg total) by mouth every 8 (eight) hours as needed for muscle spasms. Do not take with alcohol or while driving or operating heavy machinery.  May cause drowsiness. 05/02/23   Valentino Nose, NP  atorvastatin (LIPITOR) 20 MG tablet Take 1 tablet (20 mg total) by mouth at bedtime. 11/25/19 02/18/20  Salley Scarlet, MD    Family History Family History  Problem Relation Age of Onset   Hypertension Mother    Heart disease Mother        Cardiac Arrest    Hypertension Father    Cancer Father    Hypertension Sister    Cancer Brother        Bladder Cancer    Hypertension Brother    Hypertension Sister     Social History Social History   Tobacco Use   Smoking status: Former    Current packs/day: 0.00    Average packs/day: 0.3 packs/day for 20.0 years (5.0 ttl pk-yrs)     Types: Cigarettes    Start date: 1999    Quit date: 2019    Years since quitting: 5.8   Smokeless tobacco: Never  Vaping Use   Vaping status: Never Used  Substance Use Topics   Alcohol use: Not Currently    Comment: occasionally   Drug use: No     Allergies   Bee venom, Nsaids, and Shellfish allergy   Review of Systems Review of  Systems Per HPI  Physical Exam Triage Vital Signs ED Triage Vitals  Encounter Vitals Group     BP 08/08/23 1345 (!) 155/68     Systolic BP Percentile --      Diastolic BP Percentile --      Pulse Rate 08/08/23 1345 (!) 59     Resp 08/08/23 1345 18     Temp 08/08/23 1345 99.1 F (37.3 C)     Temp Source 08/08/23 1345 Oral     SpO2 08/08/23 1345 96 %     Weight --      Height --      Head Circumference --      Peak Flow --      Pain Score 08/08/23 1346 0     Pain Loc --      Pain Education --      Exclude from Growth Chart --    No data found.  Updated Vital Signs BP (!) 155/68 (BP Location: Right Arm)   Pulse (!) 59   Temp 99.1 F (37.3 C) (Oral)   Resp 18   SpO2 96%   Visual Acuity Right Eye Distance:   Left Eye Distance:   Bilateral Distance:    Right Eye Near:   Left Eye Near:    Bilateral Near:     Physical Exam Vitals and nursing note reviewed.  Constitutional:      General: She is not in acute distress.    Appearance: Normal appearance. She is not ill-appearing or toxic-appearing.  HENT:     Head: Normocephalic and atraumatic.     Right Ear: Tympanic membrane, ear canal and external ear normal. No drainage, swelling or tenderness. No middle ear effusion. Tympanic membrane is not erythematous.     Left Ear: Tympanic membrane, ear canal and external ear normal. No drainage, swelling or tenderness.  No middle ear effusion. Tympanic membrane is not erythematous.     Nose: No congestion or rhinorrhea.     Mouth/Throat:     Mouth: Mucous membranes are moist.     Pharynx: Oropharynx is clear. No oropharyngeal exudate or  posterior oropharyngeal erythema.  Eyes:     General: No scleral icterus.    Extraocular Movements: Extraocular movements intact.  Cardiovascular:     Rate and Rhythm: Normal rate and regular rhythm.  Pulmonary:     Effort: Pulmonary effort is normal. No respiratory distress.     Breath sounds: Normal breath sounds. No wheezing, rhonchi or rales.  Abdominal:     General: Abdomen is flat. Bowel sounds are normal. There is no distension.     Palpations: Abdomen is soft.  Musculoskeletal:     Cervical back: Normal range of motion and neck supple.  Lymphadenopathy:     Cervical: No cervical adenopathy.  Skin:    General: Skin is warm and dry.     Coloration: Skin is not jaundiced or pale.     Findings: No erythema or rash.  Neurological:     Mental Status: She is alert and oriented to person, place, and time.     Motor: No weakness.  Psychiatric:        Behavior: Behavior is cooperative.      UC Treatments / Results  Labs (all labs ordered are listed, but only abnormal results are displayed) Labs Reviewed  POC COVID19/FLU A&B COMBO    EKG   Radiology No results found.  Procedures Procedures (including critical care time)  Medications Ordered in UC Medications -  No data to display  Initial Impression / Assessment and Plan / UC Course  I have reviewed the triage vital signs and the nursing notes.  Pertinent labs & imaging results that were available during my care of the patient were reviewed by me and considered in my medical decision making (see chart for details).   Patient is well-appearing, normotensive, afebrile, not tachycardic, not tachypneic, oxygenating well on room air.    1. Viral URI with cough Suspect viral etiology Vitals and exam are reassuring Rapid COVID/flu combo test negative Supportive care discussed with patient ER and return precautions also discussed Work excuse provided  The patient was given the opportunity to ask questions.  All  questions answered to their satisfaction.  The patient is in agreement to this plan.    Final Clinical Impressions(s) / UC Diagnoses   Final diagnoses:  Viral URI with cough     Discharge Instructions      You have a viral upper respiratory infection.  Symptoms should improve over the next week to 10 days.  If you develop chest pain or shortness of breath, go to the emergency room.  Flu and COVID-19 test are negative today.  Some things that can make you feel better are: - Increased rest - Increasing fluid with water/sugar free electrolytes - Acetaminophen and ibuprofen as needed for fever/pain - Salt water gargling, chloraseptic spray and throat lozenges - OTC guaifenesin (Mucinex) 600 mg twice daily - Saline sinus flushes or a neti pot - Humidifying the air      ED Prescriptions   None    PDMP not reviewed this encounter.   Valentino Nose, NP 08/08/23 [phone]

## 2023-08-11 ENCOUNTER — Ambulatory Visit: Payer: Medicare Other | Admitting: Surgical

## 2023-08-15 ENCOUNTER — Other Ambulatory Visit: Payer: Self-pay | Admitting: Internal Medicine

## 2023-08-15 DIAGNOSIS — M1A0690 Idiopathic chronic gout, unspecified knee, without tophus (tophi): Secondary | ICD-10-CM

## 2023-08-29 ENCOUNTER — Ambulatory Visit: Payer: Medicare Other | Admitting: Internal Medicine

## 2023-08-29 ENCOUNTER — Encounter: Payer: Self-pay | Admitting: Internal Medicine

## 2023-08-29 VITALS — BP 136/70 | HR 51 | Ht 66.0 in | Wt 180.2 lb

## 2023-08-29 DIAGNOSIS — D229 Melanocytic nevi, unspecified: Secondary | ICD-10-CM | POA: Diagnosis not present

## 2023-08-29 DIAGNOSIS — N1832 Chronic kidney disease, stage 3b: Secondary | ICD-10-CM

## 2023-08-29 DIAGNOSIS — M1A0690 Idiopathic chronic gout, unspecified knee, without tophus (tophi): Secondary | ICD-10-CM | POA: Diagnosis not present

## 2023-08-29 DIAGNOSIS — E1169 Type 2 diabetes mellitus with other specified complication: Secondary | ICD-10-CM

## 2023-08-29 DIAGNOSIS — K219 Gastro-esophageal reflux disease without esophagitis: Secondary | ICD-10-CM | POA: Diagnosis not present

## 2023-08-29 DIAGNOSIS — I6523 Occlusion and stenosis of bilateral carotid arteries: Secondary | ICD-10-CM

## 2023-08-29 DIAGNOSIS — J329 Chronic sinusitis, unspecified: Secondary | ICD-10-CM | POA: Diagnosis not present

## 2023-08-29 DIAGNOSIS — I10 Essential (primary) hypertension: Secondary | ICD-10-CM | POA: Diagnosis not present

## 2023-08-29 MED ORDER — CLOPIDOGREL BISULFATE 75 MG PO TABS: 75.0000 mg | ORAL_TABLET | Freq: Every evening | ORAL | 3 refills | Status: DC

## 2023-08-29 MED ORDER — CARTIA XT 240 MG PO CP24: 240.0000 mg | ORAL_CAPSULE | Freq: Every day | ORAL | 3 refills | Status: DC

## 2023-08-29 MED ORDER — HYDROCHLOROTHIAZIDE 25 MG PO TABS: 25.0000 mg | ORAL_TABLET | Freq: Every day | ORAL | 1 refills | Status: DC

## 2023-08-29 MED ORDER — ALLOPURINOL 100 MG PO TABS: 200.0000 mg | ORAL_TABLET | Freq: Every day | ORAL | 0 refills | Status: DC

## 2023-08-29 MED ORDER — ATENOLOL 50 MG PO TABS: 50.0000 mg | ORAL_TABLET | Freq: Every day | ORAL | 1 refills | Status: DC

## 2023-08-29 MED ORDER — DEXLANSOPRAZOLE 60 MG PO CPDR: 1.0000 | DELAYED_RELEASE_CAPSULE | Freq: Every day | ORAL | 0 refills | Status: DC

## 2023-08-29 MED ORDER — CLOPIDOGREL BISULFATE 75 MG PO TABS: 75.0000 mg | ORAL_TABLET | Freq: Every evening | ORAL | 0 refills | Status: DC

## 2023-08-29 NOTE — Assessment & Plan Note (Signed)
BP Readings from Last 1 Encounters:  08/29/23 136/70   Usually well-controlled with Atenolol, Cartia and HCTZ Did not tolerate Hydralazine Needs to cut down soft drink intake Counseled for compliance with the medications Advised DASH diet and moderate exercise/walking, at least 150 mins/week

## 2023-08-29 NOTE — Assessment & Plan Note (Addendum)
Has recurrent sinusitis Recently completed antibiotic, prednisone for sinusitis and bronchitis Uses Tylenol cold and sinus currently Has tried OTC antihistaminics Referred to ENT specialist

## 2023-08-29 NOTE — Assessment & Plan Note (Signed)
Need to check BMP, but she prefers to get it done at her Nephrology clinic Follows up with Nephrologist - Dr. Hyman Hopes Avoid nephrotoxic agents

## 2023-08-29 NOTE — Assessment & Plan Note (Signed)
Refilled Dexilant - has been very expensive, but she does not have optimum effect with other PPI Has tried Omeprazole, Pantoprazole without relief Her nausea and epigastric pain can also likely be due to GERD/gastritis

## 2023-08-29 NOTE — Assessment & Plan Note (Signed)
Recent increase in size, has been mildly irritating to her Referred to Dermatology

## 2023-08-29 NOTE — Assessment & Plan Note (Signed)
Lab Results  Component Value Date   HGBA1C 5.7 07/13/2022   HGBA1C 5.7 07/13/2022   HGBA1C 5.7 07/13/2022   Diet controlled Associated with HTN, HLD and CKD Not on statin, does not prefer to take any new medicine

## 2023-08-29 NOTE — Assessment & Plan Note (Addendum)
Had joint fluid aspiration - showed monosodium urate crystals On Allopurinol 200 mg once daily Advised to discuss with Nephrology to check uric acid level, she prefers to do blood tests with Nephrology

## 2023-08-29 NOTE — Patient Instructions (Signed)
Please continue to take medications as prescribed.  Please continue to follow low salt diet and perform moderate exercise/walking at least 150 mins/week. 

## 2023-08-29 NOTE — Assessment & Plan Note (Signed)
On Plavix Not on statin currently, resistant for starting new medications 

## 2023-08-29 NOTE — Progress Notes (Signed)
Established Patient Office Visit  Subjective:  Patient ID: Jaclyn Brooks, female    DOB: 09-08-1961  Age: 62 y.o. MRN: [id number]  CC:  Chief Complaint  Patient presents with   Hypertension    Follow up    Referral    Referral to dermatology for mole removal     HPI Jaclyn Brooks is a 62 y.o. female with past medical history of HTN, carotid artery stenosis, COPD, GERD, diet controlled diabetes, OA, CKD stage 3b and renal artery aneurysm who presents for f/u of her HTN.  HTN: Her BP is wnl today. She denies any headache, dizziness, chest pain, dyspnea or palpitations currently.  She is currently taking atenolol 50 mg QD, Cartia 240 mg QD and hydrochlorothiazide 25 mg QD.  Her heart rate remains in 50s.  She did not tolerate hydralazine that was prescribed by nephrology.  CKD: She follows up with [facility].  Denies any dysuria, hematuria or urinary hesitancy or resistance currently.  She admits that she takes few [employer] every day.  She still has chronic nasal congestion, postnasal drip and sinus pressure related headache.  Of note, she does not have Singulair for her allergies currently.  She has tried OTC antihistaminics without much relief.  She has been taking Tylenol Cold and sinus recently.  She reports recent increase in size of a mole on her left neck/shoulder area.  She has had mild soreness due to clothes touching the mole.  Denies any recent change in shape or color.  Denies any bleeding or discharge.   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[facility])     Past Surgical History:  Procedure Laterality Date   CHOLECYSTECTOMY N/A 01/28/2015   Procedure: LAPAROSCOPIC CHOLECYSTECTOMY;  Surgeon: Franky Macho Md, MD;  Location: [facility];  Service: General;  Laterality: N/A;   excision  of bone spurs Bilateral    Big toes   renal aneurysm Right    right coil-and only has function of left kidney   TOTAL ABDOMINAL HYSTERECTOMY     X-STOP IMPLANTATION      Family History  Problem Relation Age of Onset   Hypertension Mother    Heart disease Mother        Cardiac Arrest    Hypertension Father    Cancer Father    Hypertension Sister    Cancer Brother        Bladder Cancer    Hypertension Brother    Hypertension Sister     Social History   Socioeconomic History   Marital status: Unknown    Spouse name: Not on file   Number of children: 1   Years of education: 12   Highest education level: Some college, no degree  Occupational History   Occupation: Retired  Tobacco Use   Smoking status: Former    Current packs/day: 0.00    Average packs/day: 0.3 packs/day for 20.0 years (5.0 ttl pk-yrs)    Types: Cigarettes    Start date: 1999    Quit date: 2019    Years since quitting: 5.8   Smokeless tobacco: Never  Vaping Use   Vaping status: Never Used  Substance and Sexual Activity   Alcohol use: Not Currently    Comment:  occasionally   Drug use: No   Sexual activity: Yes    Birth control/protection: Surgical  Other Topics Concern   Not on file  Social History Narrative   Not on file   Social Determinants of Health   Financial Resource Strain: Low Risk  (10/19/2021)   Overall Financial Resource Strain (CARDIA)    Difficulty of Paying Living Expenses: Not hard at all  Food Insecurity: No Food Insecurity (10/19/2021)   Hunger Vital Sign    Worried About Running Out of Food in the Last Year: Never true    Ran Out of Food in the Last Year: Never true  Transportation Needs: No Transportation Needs (10/19/2021)   PRAPARE - Administrator, Civil Service (Medical): No    Lack of Transportation (Non-Medical): No  Physical Activity: Sufficiently Active (10/19/2021)   Exercise Vital Sign    Days of Exercise per Week: 5 days    Minutes of Exercise per  Session: 30 min  Stress: No Stress Concern Present (10/19/2021)   [employer] of Occupational Health - Occupational Stress Questionnaire    Feeling of Stress : Not at all  Social Connections: Moderately Isolated (10/19/2021)   Social Connection and Isolation Panel [NHANES]    Frequency of Communication with Friends and Family: More than three times a week    Frequency of Social Gatherings with Friends and Family: More than three times a week    Attends Religious Services: More than 4 times per year    Active Member of [employer] or Organizations: No    Attends Banker Meetings: Never    Marital Status: Never married  Intimate Partner Violence: Not At Risk (10/19/2021)   Humiliation, Afraid, Rape, and Kick questionnaire    Fear of Current or Ex-Partner: No    Emotionally Abused: No    Physically Abused: No    Sexually Abused: No    Outpatient Medications Prior to Visit  Medication Sig Dispense Refill   albuterol (VENTOLIN HFA) 108 (90 Base) MCG/ACT inhaler Inhale 1-2 puffs into the lungs every 6 (six) hours as needed for wheezing or shortness of breath. 1 each 2   benzonatate (TESSALON) 100 MG capsule Take 1 capsule (100 mg total) by mouth 2 (two) times daily as needed for cough. 20 capsule 0   budesonide-formoterol (SYMBICORT) 80-4.5 MCG/ACT inhaler Inhale 2 puffs into the lungs 2 (two) times daily. 1 each 2   butalbital-acetaminophen-caffeine (FIORICET) 50-325-40 MG tablet Take 1 tablet by mouth every 12 (twelve) hours as needed for headache or migraine. 20 tablet 0   dicyclomine (BENTYL) 10 MG capsule Take 1 capsule (10 mg total) by mouth 3 (three) times daily as needed for spasms. 30 capsule 1   montelukast (SINGULAIR) 10 MG tablet Take 1 tablet (10 mg total) by mouth at bedtime. 30 tablet 3   ondansetron (ZOFRAN-ODT) 4 MG disintegrating tablet Take 1 tablet (4 mg total) by mouth every 8 (eight) hours as needed. 20 tablet 0   tiZANidine (ZANAFLEX) 4 MG tablet Take 1 tablet  (4 mg total) by mouth every 8 (eight) hours as needed for muscle spasms. Do not take with alcohol or while driving or operating heavy machinery.  May cause drowsiness. 30 tablet 0   allopurinol (ZYLOPRIM) 100 MG tablet Take 2 tablets by mouth once daily 60 tablet 0   atenolol (TENORMIN) 50 MG tablet Take 1 tablet (50 mg total) by mouth daily. 90 tablet 1   busPIRone (BUSPAR) 5 MG tablet Take 1  tablet (5 mg total) by mouth 2 (two) times daily. 60 tablet 3   CARTIA XT 240 MG 24 hr capsule Take 1 capsule (240 mg total) by mouth daily. 90 capsule 3   clopidogrel (PLAVIX) 75 MG tablet Take 1 tablet by mouth in the evening 90 tablet 0   dexlansoprazole (DEXILANT) 60 MG capsule Take 1 capsule by mouth once daily 30 capsule 0   hydrochlorothiazide (HYDRODIURIL) 25 MG tablet Take 1 tablet (25 mg total) by mouth daily. 90 tablet 1   No facility-administered medications prior to visit.    Allergies  Allergen Reactions   Bee Venom Hives   Nsaids     Kidney disease    Shellfish Allergy Itching    ROS Review of Systems  Constitutional:  Negative for chills and fever.  HENT:  Positive for congestion, postnasal drip and sinus pressure. Negative for sinus pain.   Eyes:  Negative for pain and discharge.  Respiratory:  Negative for cough and shortness of breath.   Cardiovascular:  Negative for chest pain and palpitations.  Gastrointestinal:  Negative for abdominal pain, diarrhea and vomiting.  Endocrine: Negative for polydipsia and polyuria.  Genitourinary:  Negative for dysuria and hematuria.  Musculoskeletal:  Positive for arthralgias, back pain and neck pain. Negative for neck stiffness.  Skin:  Negative for rash.  Neurological:  Negative for dizziness and weakness.  Psychiatric/Behavioral:  Positive for agitation. Negative for behavioral problems. The patient is nervous/anxious.       Objective:    Physical Exam Vitals reviewed.  Constitutional:      General: She is not in acute  distress.    Appearance: She is not diaphoretic.  HENT:     Head: Normocephalic and atraumatic.     Mouth/Throat:     Mouth: Mucous membranes are moist.  Eyes:     General: No scleral icterus.    Extraocular Movements: Extraocular movements intact.  Cardiovascular:     Rate and Rhythm: Normal rate and regular rhythm.     Pulses: Normal pulses.     Heart sounds: Normal heart sounds. No murmur heard. Pulmonary:     Breath sounds: Normal breath sounds. No wheezing or rales.  Musculoskeletal:     Cervical back: Neck supple. No tenderness.     Right lower leg: No edema.     Left lower leg: No edema.  Skin:    General: Skin is warm.     Findings: No rash.     Comments: Circular mole over left neck/shoulder area - image in Media  Neurological:     General: No focal deficit present.     Mental Status: She is alert and oriented to person, place, and time.  Psychiatric:        Mood and Affect: Mood normal.        Behavior: Behavior normal.     BP 136/70 (BP Location: Left Arm, Patient Position: Sitting, Cuff Size: Normal)   Pulse (!) 51   Ht 5\' 6"  (1.676 m)   Wt 180 lb 3.2 oz (81.7 kg)   SpO2 98%   BMI 29.09 kg/m  Wt Readings from Last 3 Encounters:  08/29/23 180 lb 3.2 oz (81.7 kg)  07/18/23 180 lb 12.8 oz (82 kg)  07/06/23 180 lb 6.4 oz (81.8 kg)    Lab Results  Component Value Date   TSH 2.43 06/19/2020   Lab Results  Component Value Date   WBC 8.4 01/15/2021   HGB 13.3 01/15/2021   HCT  39.3 01/15/2021   MCV 96.1 01/15/2021   PLT 313 01/15/2021   Lab Results  Component Value Date   NA 137 03/04/2021   K CANCELED 03/04/2021   CO2 24 03/04/2021   GLUCOSE 132 (H) 03/04/2021   BUN 32 (H) 03/04/2021   CREATININE 1.34 (H) 03/04/2021   BILITOT CANCELED 03/04/2021   ALKPHOS 60 01/26/2015   AST CANCELED 03/04/2021   ALT 30 (H) 03/04/2021   PROT 8.1 03/04/2021   ALBUMIN 4.0 01/26/2015   CALCIUM 10.5 (H) 03/04/2021   ANIONGAP 10 01/26/2015   EGFR 44.0 03/21/2023    Lab Results  Component Value Date   CHOL 157 06/19/2020   Lab Results  Component Value Date   HDL 24 (L) 06/19/2020   Lab Results  Component Value Date   LDLCALC 92 06/19/2020   Lab Results  Component Value Date   TRIG 287 (H) 06/19/2020   Lab Results  Component Value Date   CHOLHDL 6.5 (H) 06/19/2020   Lab Results  Component Value Date   HGBA1C 5.7 07/13/2022   HGBA1C 5.7 07/13/2022   HGBA1C 5.7 07/13/2022      Assessment & Plan:   Problem List Items Addressed This Visit       Cardiovascular and Mediastinum   Hypertension    BP Readings from Last 1 Encounters:  08/29/23 136/70   Usually well-controlled with Atenolol, Cartia and HCTZ Did not tolerate Hydralazine Needs to cut down soft drink intake Counseled for compliance with the medications Advised DASH diet and moderate exercise/walking, at least 150 mins/week      Relevant Medications   atenolol (TENORMIN) 50 MG tablet   CARTIA XT 240 MG 24 hr capsule   hydrochlorothiazide (HYDRODIURIL) 25 MG tablet   Carotid arterial disease (HCC)    On Plavix Not on statin currently, resistant for starting new medications      Relevant Medications   atenolol (TENORMIN) 50 MG tablet   CARTIA XT 240 MG 24 hr capsule   hydrochlorothiazide (HYDRODIURIL) 25 MG tablet   clopidogrel (PLAVIX) 75 MG tablet     Respiratory   Chronic sinusitis - Primary    Has recurrent sinusitis Recently completed antibiotic, prednisone for sinusitis and bronchitis Uses Tylenol cold and sinus currently Has tried OTC antihistaminics Referred to ENT specialist      Relevant Orders   Ambulatory referral to ENT     Digestive   GERD (gastroesophageal reflux disease)    Refilled Dexilant - has been very expensive, but she does not have optimum effect with other PPI Has tried Omeprazole, Pantoprazole without relief Her nausea and epigastric pain can also likely be due to GERD/gastritis      Relevant Medications    dexlansoprazole (DEXILANT) 60 MG capsule     Endocrine   Type 2 diabetes mellitus with other specified complication (HCC)    Lab Results  Component Value Date   HGBA1C 5.7 07/13/2022   HGBA1C 5.7 07/13/2022   HGBA1C 5.7 07/13/2022   Diet controlled Associated with HTN, HLD and CKD Not on statin, does not prefer to take any new medicine      Relevant Orders   Bayer DCA Hb A1c Waived   Urine Microalbumin w/creat. ratio     Musculoskeletal and Integument   Enlarged skin mole    Recent increase in size, has been mildly irritating to her Referred to Dermatology      Relevant Orders   Ambulatory referral to Dermatology     Genitourinary  CKD (chronic kidney disease) stage 3, GFR 30-59 ml/min (HCC)    Need to check BMP, but she prefers to get it done at her Nephrology clinic Follows up with Nephrologist - Dr. Hyman Hopes Avoid nephrotoxic agents        Other   Gout    Had joint fluid aspiration - showed monosodium urate crystals On Allopurinol 200 mg once daily Advised to discuss with Nephrology to check uric acid level, she prefers to do blood tests with Nephrology      Relevant Medications   allopurinol (ZYLOPRIM) 100 MG tablet      Meds ordered this encounter  Medications   atenolol (TENORMIN) 50 MG tablet    Sig: Take 1 tablet (50 mg total) by mouth daily.    Dispense:  90 tablet    Refill:  1   allopurinol (ZYLOPRIM) 100 MG tablet    Sig: Take 2 tablets (200 mg total) by mouth daily.    Dispense:  60 tablet    Refill:  0   CARTIA XT 240 MG 24 hr capsule    Sig: Take 1 capsule (240 mg total) by mouth daily.    Dispense:  90 capsule    Refill:  3   DISCONTD: clopidogrel (PLAVIX) 75 MG tablet    Sig: Take 1 tablet (75 mg total) by mouth every evening.    Dispense:  90 tablet    Refill:  0   dexlansoprazole (DEXILANT) 60 MG capsule    Sig: Take 1 capsule (60 mg total) by mouth daily.    Dispense:  30 capsule    Refill:  0   hydrochlorothiazide (HYDRODIURIL)  25 MG tablet    Sig: Take 1 tablet (25 mg total) by mouth daily.    Dispense:  90 tablet    Refill:  1   clopidogrel (PLAVIX) 75 MG tablet    Sig: Take 1 tablet (75 mg total) by mouth every evening.    Dispense:  90 tablet    Refill:  3    Follow-up: Return in about 4 months (around 12/27/2023).    Anabel Halon, MD

## 2023-08-31 ENCOUNTER — Telehealth: Payer: Self-pay | Admitting: Internal Medicine

## 2023-08-31 LAB — MICROALBUMIN / CREATININE URINE RATIO
Creatinine, Urine: 110.5 mg/dL
Microalb/Creat Ratio: 1109 mg/g{creat} — ABNORMAL HIGH (ref 0–29)
Microalbumin, Urine: 1225.3 ug/mL

## 2023-08-31 LAB — BAYER DCA HB A1C WAIVED: HB A1C (BAYER DCA - WAIVED): 6.6 % — ABNORMAL HIGH (ref 4.8–5.6)

## 2023-08-31 NOTE — Telephone Encounter (Signed)
[employer] forms FMLA  Copied Noted Sleeved (put on provider box)

## 2023-09-05 NOTE — Telephone Encounter (Signed)
Called patient left voicemail pick up forms, patient needs to complete her part. Forms in front folder

## 2023-09-17 ENCOUNTER — Encounter: Payer: Self-pay | Admitting: Emergency Medicine

## 2023-09-17 ENCOUNTER — Ambulatory Visit
Admission: EM | Admit: 2023-09-17 | Discharge: 2023-09-17 | Disposition: A | Discharge status: Home / Self Care | Payer: Medicare Other | Attending: Nurse Practitioner | Admitting: Nurse Practitioner

## 2023-09-17 ENCOUNTER — Ambulatory Visit: Payer: Medicare Other

## 2023-09-17 DIAGNOSIS — R109 Unspecified abdominal pain: Secondary | ICD-10-CM

## 2023-09-17 DIAGNOSIS — R197 Diarrhea, unspecified: Secondary | ICD-10-CM

## 2023-09-17 LAB — POCT URINALYSIS DIP (MANUAL ENTRY)
Bilirubin, UA: NEGATIVE
Blood, UA: NEGATIVE
Glucose, UA: NEGATIVE mg/dL
Ketones, POC UA: NEGATIVE mg/dL
Leukocytes, UA: NEGATIVE
Nitrite, UA: NEGATIVE
Protein Ur, POC: >=300 mg/dL — AB
Spec Grav, UA: >=1.03 — AB (ref 1.010–1.025)
Urobilinogen, UA: 0.2 U/dL
pH, UA: 5.5 (ref 5.0–8.0)

## 2023-09-17 NOTE — Discharge Instructions (Addendum)
Urine culture is pending.  You will be contacted if the pending test result is abnormal. The x-ray of your abdomen does not show any acute findings.  As discussed, there is mention of abdominal calcifications, which can be further explored with CT scan. Take medication as prescribed. Increase fluids and allow for plenty of rest.  Try to drink at least 5-6 bottles of water daily. Recommend a brat diet while nausea persists.  This includes bananas, rice, applesauce, and toast.  I am also providing information for you to refer to for foods to help relieve your diarrhea. Go to the emergency department immediately if you experience more severe pain in your right side, vomiting, worsening diarrhea, or new symptoms of fever, chills, or other concerns. Please follow-up with your primary care physician to discuss referral to GI or outpatient CT scan for further evaluation of your current symptoms. Continue follow-up with [facility] as scheduled. Follow-up as needed.

## 2023-09-17 NOTE — ED Provider Notes (Signed)
[facility]    CSN: [id number] Arrival date & time: 09/17/23  1037      History   Chief Complaint No chief complaint on file.   HPI Jaclyn Brooks is a 62 y.o. female.   The history is provided by the patient.   Patient presents for complaints of right flank pain that is been present for the past 2 days.  Patient states symptoms have been present for the past several months, but have become more severe.  She describes the pain as "cramping" with some abdominal bloating.  She also reports that she has had loose stools associated with the pain.  Patient was seen by her PCP in September for the same or similar symptoms.  Patient is currently taking Dexilant and Bentyl as needed for abdominal cramping.  Patient denies fever, chills, chest pain, vomiting, bloody stools, constipation, or urinary symptoms.  Patient reports that she has been feeling nauseated.  Patient with prior history of chronic kidney disease, and cholecystectomy.  Patient reports that she is under the care of of [facility].  Patient reports she is drinking approximately 3 bottles of water daily. 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[facility])     Patient Active Problem List   Diagnosis Date Noted   Chronic sinusitis 08/29/2023   Enlarged skin mole 08/29/2023   Dysphagia 07/23/2023   Reactive airway disease 07/06/2023   Loose bowel movements 07/06/2023   Primary osteoarthritis of left hip 05/05/2023   Dysuria 04/16/2023   Diarrhea of presumed infectious origin 02/01/2023   Acute bronchitis 12/22/2022   Intractable migraine with aura without status migrainosus 08/15/2022   Acute otitis externa of right ear 07/13/2022   Traumatic hematoma of right lower leg 07/13/2022   History of total abdominal hysterectomy  07/04/2022   GAD (generalized anxiety disorder) 04/28/2022   Caregiver stress 04/28/2022   Chronic left-sided low back pain without sciatica 04/13/2022   Contact dermatitis 04/13/2022   Primary osteoarthritis of right knee 03/09/2022   Refused influenza vaccine 12/02/2021   Encounter for general adult medical examination with abnormal findings 11/30/2021   Epidermal cyst 11/30/2021   Acute frontal sinusitis 09/21/2021   Allergic rhinitis 08/29/2021   Nausea 08/27/2021   Hemorrhoids 07/07/2021   Gout 03/25/2021   Type 2 diabetes mellitus with other specified complication (HCC) 07/14/2020   Primary osteoarthritis of right foot 05/12/2020   GERD (gastroesophageal reflux disease) 05/31/2019   Solitary kidney, acquired 05/10/2019   Carotid arterial disease (HCC) 12/11/2018   COPD, 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/01/2018   CKD (chronic kidney disease) stage 3, GFR 30-59 ml/min (HCC) 10/01/2018   Aortic valve sclerosis 10/01/2018   Hypertension 09/11/2018    Past Surgical History:  Procedure Laterality Date   CHOLECYSTECTOMY N/A 01/28/2015   Procedure: LAPAROSCOPIC CHOLECYSTECTOMY;  Surgeon: Franky Macho Md, MD;  Location: [facility];  Service: General;  Laterality: N/A;   excision of bone spurs Bilateral    Big toes   renal aneurysm Right    right coil-and only has function of left kidney   TOTAL ABDOMINAL HYSTERECTOMY     X-STOP IMPLANTATION      OB History     Gravida  1   Para  1   Term  Preterm  1   AB      Living         SAB      IAB      Ectopic      Multiple      Live Births               Home Medications    Prior to Admission medications   Medication Sig Start Date End Date Taking? Authorizing Provider  albuterol (VENTOLIN HFA) 108 (90 Base) MCG/ACT inhaler Inhale 1-2 puffs into the lungs every 6 (six) hours as needed for wheezing or shortness of breath. 12/22/22   Anabel Halon, MD  allopurinol (ZYLOPRIM) 100 MG tablet Take 2 tablets (200 mg total) by mouth  daily. 08/29/23   Anabel Halon, MD  atenolol (TENORMIN) 50 MG tablet Take 1 tablet (50 mg total) by mouth daily. 08/29/23   Anabel Halon, MD  benzonatate (TESSALON) 100 MG capsule Take 1 capsule (100 mg total) by mouth 2 (two) times daily as needed for cough. 07/18/23   Kerri Perches, MD  budesonide-formoterol (SYMBICORT) 80-4.5 MCG/ACT inhaler Inhale 2 puffs into the lungs 2 (two) times daily. 07/06/23   Anabel Halon, MD  butalbital-acetaminophen-caffeine (FIORICET) [phone] MG tablet Take 1 tablet by mouth every 12 (twelve) hours as needed for headache or migraine. 08/15/22   Patel, Earlie Lou, MD  CARTIA XT 240 MG 24 hr capsule Take 1 capsule (240 mg total) by mouth daily. 08/29/23   Anabel Halon, MD  clopidogrel (PLAVIX) 75 MG tablet Take 1 tablet (75 mg total) by mouth every evening. 08/29/23   Anabel Halon, MD  dexlansoprazole (DEXILANT) 60 MG capsule Take 1 capsule (60 mg total) by mouth daily. 08/29/23   Anabel Halon, MD  dicyclomine (BENTYL) 10 MG capsule Take 1 capsule (10 mg total) by mouth 3 (three) times daily as needed for spasms. 07/06/23   Anabel Halon, MD  hydrochlorothiazide (HYDRODIURIL) 25 MG tablet Take 1 tablet (25 mg total) by mouth daily. 08/29/23   Anabel Halon, MD  montelukast (SINGULAIR) 10 MG tablet Take 1 tablet (10 mg total) by mouth at bedtime. 07/06/23   Anabel Halon, MD  ondansetron (ZOFRAN-ODT) 4 MG disintegrating tablet Take 1 tablet (4 mg total) by mouth every 8 (eight) hours as needed. 06/05/23   Leath-Warren, Sadie Haber, NP  tiZANidine (ZANAFLEX) 4 MG tablet Take 1 tablet (4 mg total) by mouth every 8 (eight) hours as needed for muscle spasms. Do not take with alcohol or while driving or operating heavy machinery.  May cause drowsiness. 05/02/23   Valentino Nose, NP  atorvastatin (LIPITOR) 20 MG tablet Take 1 tablet (20 mg total) by mouth at bedtime. 11/25/19 02/18/20  Salley Scarlet, MD    Family History Family History  Problem  Relation Age of Onset   Hypertension Mother    Heart disease Mother        Cardiac Arrest    Hypertension Father    Cancer Father    Hypertension Sister    Cancer Brother        Bladder Cancer    Hypertension Brother    Hypertension Sister     Social History Social History   Tobacco Use   Smoking status: Former    Current packs/day: 0.00    Average packs/day: 0.3 packs/day for 20.0 years (5.0 ttl pk-yrs)    Types: Cigarettes    Start date: 1999  Quit date: 2019    Years since quitting: 5.9   Smokeless tobacco: Never  Vaping Use   Vaping status: Never Used  Substance Use Topics   Alcohol use: Not Currently    Comment: occasionally   Drug use: No     Allergies   Bee venom, Nsaids, and Shellfish allergy   Review of Systems Review of Systems Per HPI  Physical Exam Triage Vital Signs ED Triage Vitals  Encounter Vitals Group     BP 09/17/23 1119 132/75     Systolic BP Percentile --      Diastolic BP Percentile --      Pulse Rate 09/17/23 1119 60     Resp 09/17/23 1119 18     Temp 09/17/23 1119 98.6 F (37 C)     Temp Source 09/17/23 1119 Oral     SpO2 09/17/23 1119 98 %     Weight --      Height --      Head Circumference --      Peak Flow --      Pain Score 09/17/23 1123 4     Pain Loc --      Pain Education --      Exclude from Growth Chart --    No data found.  Updated Vital Signs BP 132/75 (BP Location: Right Arm)   Pulse 60   Temp 98.6 F (37 C) (Oral)   Resp 18   SpO2 98%   Visual Acuity Right Eye Distance:   Left Eye Distance:   Bilateral Distance:    Right Eye Near:   Left Eye Near:    Bilateral Near:     Physical Exam Vitals and nursing note reviewed.  Constitutional:      General: She is not in acute distress.    Appearance: Normal appearance.  HENT:     Head: Normocephalic.  Eyes:     Extraocular Movements: Extraocular movements intact.     Pupils: Pupils are equal, round, and reactive to light.  Cardiovascular:      Rate and Rhythm: Normal rate and regular rhythm.     Pulses: Normal pulses.     Heart sounds: Normal heart sounds.  Pulmonary:     Effort: Pulmonary effort is normal. No respiratory distress.     Breath sounds: Normal breath sounds. No stridor. No wheezing, rhonchi or rales.  Abdominal:     General: Bowel sounds are normal. There is no distension.     Palpations: Abdomen is soft.     Tenderness: There is abdominal tenderness. There is right CVA tenderness. There is no left CVA tenderness, guarding or rebound.  Musculoskeletal:     Cervical back: Normal range of motion.  Lymphadenopathy:     Cervical: No cervical adenopathy.  Skin:    General: Skin is warm and dry.  Neurological:     General: No focal deficit present.     Mental Status: She is alert and oriented to person, place, and time.  Psychiatric:        Mood and Affect: Mood normal.        Behavior: Behavior normal.      UC Treatments / Results  Labs (all labs ordered are listed, but only abnormal results are displayed) Labs Reviewed  POCT URINALYSIS DIP (MANUAL ENTRY) - Abnormal; Notable for the following components:      Result Value   Clarity, UA hazy (*)    Spec Grav, UA >=1.030 (*)    Protein  Ur, POC >=300 (*)    All other components within normal limits    EKG   Radiology DG Abd 2 Views  Result Date: 09/17/2023 CLINICAL DATA:  Abdominal pain for 2 days EXAM: ABDOMEN - 2 VIEW COMPARISON:  Chest x-ray 02/13/2018. FINDINGS: The bowel gas pattern is normal. No free intraperitoneal air. Upper abdominal calcifications that may represent stones has been present and unchanged since 2019. IMPRESSION: Unremarkable bowel gas pattern.  No acute findings. Electronically Signed   By: Layla Maw M.D.   On: 09/17/2023 11:54    Procedures Procedures (including critical care time)  Medications Ordered in UC Medications - No data to display  Initial Impression / Assessment and Plan / UC Course  I have reviewed the  triage vital signs and the nursing notes.  Pertinent labs & imaging results that were available during my care of the patient were reviewed by me and considered in my medical decision making (see chart for details).  Urinalysis was positive for protein, but this is expected given the patient's underlying history of kidney disease.  Urine culture is pending.  X-ray of the abdomen does not show any acute findings.  Discussion with patient after review of the patient's September visit with her PCP and advised that PCP did discuss referral to GI.  Did also discussed with patient that she may need further imaging such as a CT scan.  In the interim, patient advised to continue her current medications to include Bentyl and Dexilant.  Supportive care recommendations were provided and discussed with the patient to include Tylenol for pain or discomfort, increasing her fluid intake, and increasing the fiber in her diet to help with the diarrhea she is currently experiencing.  For her nausea, Zofran 4 mg ODT was prescribed.  Patient was given strict ER follow-up precautions, along with indications to follow-up with her PCP to discuss treatment of her current symptoms.  Patient was in agreement with this plan of care and verbalizes understanding.  All questions were answered.  Patient stable for discharge.  Final Clinical Impressions(s) / UC Diagnoses   Final diagnoses:  Right flank pain  Abdominal cramping  Diarrhea, unspecified type     Discharge Instructions      Urine culture is pending.  You will be contacted if the pending test result is abnormal. The x-ray of your abdomen does not show any acute findings.  As discussed, there is mention of abdominal calcifications, which can be further explored with CT scan. Take medication as prescribed. Increase fluids and allow for plenty of rest.  Try to drink at least 5-6 bottles of water daily. Recommend a brat diet while nausea persists.  This includes  bananas, rice, applesauce, and toast.  I am also providing information for you to refer to for foods to help relieve your diarrhea. Go to the emergency department immediately if you experience more severe pain in your right side, vomiting, worsening diarrhea, or new symptoms of fever, chills, or other concerns. Please follow-up with your primary care physician to discuss referral to GI or outpatient CT scan for further evaluation of your current symptoms. Continue follow-up with [facility] as scheduled. Follow-up as needed.      ED Prescriptions   None    PDMP not reviewed this encounter.   Abran Cantor, NP 09/17/23 1546

## 2023-09-17 NOTE — ED Triage Notes (Signed)
Right side pain and  ABD pain x 2 days.  Feels nauseated

## 2023-09-18 LAB — URINE CULTURE: Culture: <10000 — AB

## 2023-09-19 ENCOUNTER — Encounter: Payer: Self-pay | Admitting: Family Medicine

## 2023-09-19 ENCOUNTER — Ambulatory Visit (INDEPENDENT_AMBULATORY_CARE_PROVIDER_SITE_OTHER): Payer: Medicare Other | Admitting: Family Medicine

## 2023-09-19 VITALS — BP 145/82 | HR 59 | Ht 66.0 in | Wt 181.0 lb

## 2023-09-19 DIAGNOSIS — I10 Essential (primary) hypertension: Secondary | ICD-10-CM | POA: Diagnosis not present

## 2023-09-19 DIAGNOSIS — R109 Unspecified abdominal pain: Secondary | ICD-10-CM | POA: Diagnosis not present

## 2023-09-19 DIAGNOSIS — R1011 Right upper quadrant pain: Secondary | ICD-10-CM | POA: Insufficient documentation

## 2023-09-19 NOTE — Patient Instructions (Signed)
F/U with pCP, dr Allena Katz if symptoms fail to improve and be resolved I next 2 weeks, call if you need to be seen sooner  You had [place] of kidneys in April 2024, so no Korea of kidneys is being ordered, hi  I am ordering an [place] of your upper abdomen on right where pain is the most, you will be contacted with the result  Thanks for choosing [facility], we consider it a privelige to serve you.

## 2023-09-22 ENCOUNTER — Other Ambulatory Visit: Payer: Self-pay | Admitting: Surgical

## 2023-09-22 ENCOUNTER — Ambulatory Visit: Payer: Medicare Other | Admitting: Surgical

## 2023-09-22 DIAGNOSIS — M1612 Unilateral primary osteoarthritis, left hip: Secondary | ICD-10-CM | POA: Diagnosis not present

## 2023-09-22 DIAGNOSIS — M79652 Pain in left thigh: Secondary | ICD-10-CM | POA: Diagnosis not present

## 2023-09-22 MED ORDER — METHOCARBAMOL 500 MG PO TABS: 500.0000 mg | ORAL_TABLET | Freq: Three times a day (TID) | ORAL | 1 refills | Status: DC | PRN

## 2023-09-23 ENCOUNTER — Encounter: Payer: Self-pay | Admitting: Surgical

## 2023-09-23 NOTE — Progress Notes (Signed)
Office Visit Note   Patient: Jaclyn Brooks           Date of Birth: [birth year]           MRN: [id number] Visit Date: 09/22/2023 Requested by: Anabel Halon, MD [address] [place],  [place] [address] PCP: Anabel Halon, MD  Subjective: Chief Complaint  Patient presents with   Left Leg - Pain    HPI: Jaclyn Brooks is a 62 y.o. female who presents to the office reporting left leg pain.  Patient reports that she woke up with pain last night in her left leg.  Localizes pain to the anterior thigh.  Has a little bit of groin pain but most of her pain is in the thigh region.  Has cramping sensation and burning quality at times.  She also has tingling sensation.  She had a lot more activity at work yesterday with about double the amount of walking she normally does.  She does have history of left hip arthritis with previous injection in the left hip on 04/26/2023.                ROS: All systems reviewed are negative as they relate to the chief complaint within the history of present illness.  Patient denies fevers or chills.  Assessment & Plan: Visit Diagnoses:  1. Left thigh pain   2. Arthritis of left hip     Plan: Patient is a 62 year old female with history of left hip arthritis.  Presents today for 1 day of symptoms with similar symptoms to previous visit on 7/17 with cramping and burning sensation in the anterior thigh.  She had previous left hip injection at that time.  We discussed options available to patient and she would like to hold off on any intervention for now which is reasonable with only 1 day of symptoms.  We will try Robaxin to take as needed to see if this will help with the cramping sensation but recommended she return if symptoms persist.  Follow-Up Instructions: No follow-ups on file.   Orders:  No orders of the defined types were placed in this encounter.  No orders of the defined types were placed in this encounter.     Procedures: No procedures  performed   Clinical Data: No additional findings.  Objective: Vital Signs: There were no vitals taken for this visit.  Physical Exam:  Constitutional: Patient appears well-developed HEENT:  Head: Normocephalic Eyes:EOM are normal Neck: Normal range of motion Cardiovascular: Normal rate Pulmonary/chest: Effort normal Neurologic: Patient is alert Skin: Skin is warm Psychiatric: Patient has normal mood and affect  Ortho Exam: Ortho exam demonstrates increased pain with FADIR sign of the left lower extremity.  Positive Stinchfield sign.  Decreased internal rotation of the left hip.  Intact hip flexion, quadricep, hamstring, dorsiflexion, plantarflexion rated 5/5.  Negative straight leg raise.  Ambulates without a significant antalgia.  Specialty Comments:  No specialty comments available.  Imaging: No results found.   PMFS History: Patient Active Problem List   Diagnosis Date Noted   Flank pain 09/19/2023   RUQ pain 09/19/2023   Chronic sinusitis 08/29/2023   Enlarged skin mole 08/29/2023   Dysphagia 07/23/2023   Reactive airway disease 07/06/2023   Loose bowel movements 07/06/2023   Primary osteoarthritis of left hip 05/05/2023   Dysuria 04/16/2023   Diarrhea of presumed infectious origin 02/01/2023   Acute bronchitis 12/22/2022   Intractable migraine with aura without status migrainosus 08/15/2022  Acute otitis externa of right ear 07/13/2022   Traumatic hematoma of right lower leg 07/13/2022   History of total abdominal hysterectomy 07/04/2022   GAD (generalized anxiety disorder) 04/28/2022   Caregiver stress 04/28/2022   Chronic left-sided low back pain without sciatica 04/13/2022   Contact dermatitis 04/13/2022   Primary osteoarthritis of right knee 03/09/2022   Refused influenza vaccine 12/02/2021   Encounter for general adult medical examination with abnormal findings 11/30/2021   Epidermal cyst 11/30/2021   Acute frontal sinusitis 09/21/2021   Allergic  rhinitis 08/29/2021   Nausea 08/27/2021   Hemorrhoids 07/07/2021   Gout 03/25/2021   Type 2 diabetes mellitus with other specified complication (HCC) 07/14/2020   Primary osteoarthritis of right foot 05/12/2020   GERD (gastroesophageal reflux disease) 05/31/2019   Solitary kidney, acquired 05/10/2019   Carotid arterial disease (HCC) 12/11/2018   COPD, 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/01/2018   CKD (chronic kidney disease) stage 3, GFR 30-59 ml/min (HCC) 10/01/2018   Aortic valve sclerosis 10/01/2018   Hypertension 09/11/2018   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HCC)     Family History  Problem Relation Age of Onset   Hypertension Mother    Heart disease Mother        Cardiac Arrest    Hypertension Father    Cancer Father    Hypertension Sister    Cancer Brother        Bladder Cancer    Hypertension Brother    Hypertension Sister     Past Surgical History:  Procedure Laterality Date   CHOLECYSTECTOMY N/A 01/28/2015   Procedure: LAPAROSCOPIC CHOLECYSTECTOMY;  Surgeon: Franky Macho Md, MD;  Location: [facility];  Service: General;  Laterality: N/A;   excision of bone spurs Bilateral    Big toes   renal aneurysm Right    right coil-and only has function of left kidney   TOTAL ABDOMINAL HYSTERECTOMY     X-STOP IMPLANTATION     Social History   Occupational History   Occupation: Retired  Tobacco Use   Smoking status: Former    Current packs/day: 0.00    Average packs/day: 0.3 packs/day for 20.0 years (5.0 ttl pk-yrs)    Types: Cigarettes    Start date: 1999    Quit date: 2019    Years since quitting: 5.9   Smokeless tobacco: Never  Vaping Use   Vaping status: Never Used  Substance and Sexual Activity   Alcohol use: Not Currently    Comment: occasionally   Drug use: No   Sexual activity: Yes    Birth  control/protection: Surgical

## 2023-09-26 ENCOUNTER — Ambulatory Visit (HOSPITAL_COMMUNITY): Payer: Medicare Other | Attending: Family Medicine

## 2023-09-28 DIAGNOSIS — N1832 Chronic kidney disease, stage 3b: Secondary | ICD-10-CM | POA: Diagnosis not present

## 2023-10-03 DIAGNOSIS — N1832 Chronic kidney disease, stage 3b: Secondary | ICD-10-CM | POA: Diagnosis not present

## 2023-10-03 DIAGNOSIS — I722 Aneurysm of renal artery: Secondary | ICD-10-CM | POA: Diagnosis not present

## 2023-10-03 DIAGNOSIS — I129 Hypertensive chronic kidney disease with stage 1 through stage 4 chronic kidney disease, or unspecified chronic kidney disease: Secondary | ICD-10-CM | POA: Diagnosis not present

## 2023-10-03 DIAGNOSIS — R809 Proteinuria, unspecified: Secondary | ICD-10-CM | POA: Diagnosis not present

## 2023-10-03 DIAGNOSIS — Z905 Acquired absence of kidney: Secondary | ICD-10-CM | POA: Diagnosis not present

## 2023-10-03 DIAGNOSIS — N2581 Secondary hyperparathyroidism of renal origin: Secondary | ICD-10-CM | POA: Diagnosis not present

## 2023-10-03 DIAGNOSIS — I701 Atherosclerosis of renal artery: Secondary | ICD-10-CM | POA: Diagnosis not present

## 2023-10-06 ENCOUNTER — Institutional Professional Consult (permissible substitution) (INDEPENDENT_AMBULATORY_CARE_PROVIDER_SITE_OTHER): Payer: Medicare Other

## 2023-10-10 NOTE — Assessment & Plan Note (Signed)
Present for months, noted to be increased in past week, needs [place]

## 2023-10-10 NOTE — Assessment & Plan Note (Signed)
 Blood pressure elevated at vist , will defer to PCP for med adjustment  if necessary DASH diet and commitment to daily physical activity for a minimum of 30 minutes discussed and encouraged, as a part of hypertension management. The importance of attaining a healthy weight is also discussed.     09/19/2023    1:56 PM 09/19/2023    1:22 PM 09/19/2023    1:20 PM 09/17/2023   11:19 AM 08/29/2023   10:50 AM 08/08/2023    1:45 PM 07/18/2023   11:21 AM  BP/Weight  Systolic BP 145 154 154 132 136 155 141  Diastolic BP 82 66 78 75 70 68 71  Wt. (Lbs)   181.04  180.2  180.8  BMI   29.22 kg/m2  29.09 kg/m2  29.18 kg/m2

## 2023-10-18 ENCOUNTER — Ambulatory Visit
Admission: EM | Admit: 2023-10-18 | Discharge: 2023-10-18 | Disposition: A | Discharge status: Home / Self Care | Payer: Medicare Other | Attending: Nurse Practitioner | Admitting: Nurse Practitioner

## 2023-10-18 DIAGNOSIS — B349 Viral infection, unspecified: Secondary | ICD-10-CM | POA: Diagnosis not present

## 2023-10-18 LAB — POC COVID19/FLU A&B COMBO
Covid Antigen, POC: NEGATIVE
Influenza A Antigen, POC: NEGATIVE
Influenza B Antigen, POC: NEGATIVE

## 2023-10-18 NOTE — ED Triage Notes (Signed)
 Pt reports she has right ear pain, n/v, diarrhea, and headache since this morning.

## 2023-10-18 NOTE — Discharge Instructions (Signed)
 The COVID/flu test was negative.  I do suspect this is a viral illness. Continue your regular medication. Increase fluids and allow for plenty of rest.  Recommend use of Pedialyte or Gatorlyte to prevent dehydration. May take over-the-counter Tylenol  as needed for pain, fever, or general discomfort. Recommend a brat diet, this includes bananas, rice, applesauce, and toast while nausea and vomiting persist. For your diarrhea, have given you information on foods that you can eat to help reduce symptoms. A viral illness can last from 7 to 10 days.  If your symptoms suddenly worsen before that time, or extend beyond that time, you may follow-up in this clinic or with your primary care physician for further evaluation. Follow-up as needed.

## 2023-10-18 NOTE — ED Provider Notes (Signed)
 [facility]    CSN: [id number] Arrival date & time: 10/18/23  1142      History   Chief Complaint No chief complaint on file.   HPI DYONNA JASPERS is a 63 y.o. female.   The history is provided by the patient.    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[facility])     Patient Active Problem List   Diagnosis Date Noted   Flank pain 09/19/2023   RUQ pain 09/19/2023   Chronic sinusitis 08/29/2023   Enlarged skin mole 08/29/2023   Dysphagia 07/23/2023   Reactive airway disease 07/06/2023   Loose bowel movements 07/06/2023   Primary osteoarthritis of left hip 05/05/2023   Dysuria 04/16/2023   Diarrhea of presumed infectious origin 02/01/2023   Intractable migraine with aura without status migrainosus 08/15/2022   Traumatic hematoma of right lower leg 07/13/2022   History of total abdominal hysterectomy 07/04/2022   GAD (generalized anxiety disorder) 04/28/2022   Caregiver stress 04/28/2022   Chronic left-sided low back pain without sciatica 04/13/2022   Contact dermatitis 04/13/2022   Primary osteoarthritis of right knee 03/09/2022   Refused influenza vaccine 12/02/2021   Encounter for general adult medical examination with abnormal findings 11/30/2021   Epidermal cyst 11/30/2021   Allergic rhinitis 08/29/2021   Nausea 08/27/2021   Hemorrhoids 07/07/2021   Gout 03/25/2021   Type 2 diabetes mellitus with other specified complication (HCC) 07/14/2020   Primary osteoarthritis of right foot 05/12/2020   GERD (gastroesophageal reflux disease) 05/31/2019   Solitary kidney, acquired 05/10/2019   Carotid arterial disease (HCC) 12/11/2018   COPD, 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/01/2018   CKD (chronic kidney disease) stage 3, GFR 30-59 ml/min (HCC) 10/01/2018   Aortic valve sclerosis 10/01/2018   Hypertension  09/11/2018    Past Surgical History:  Procedure Laterality Date   CHOLECYSTECTOMY N/A 01/28/2015   Procedure: LAPAROSCOPIC CHOLECYSTECTOMY;  Surgeon: Oneil Budge Md, MD;  Location: [facility];  Service: General;  Laterality: N/A;   excision of bone spurs Bilateral    Big toes   renal aneurysm Right    right coil-and only has function of left kidney   TOTAL ABDOMINAL HYSTERECTOMY     X-STOP IMPLANTATION      OB History     Gravida  1   Para  1   Term      Preterm  1   AB      Living         SAB      IAB      Ectopic      Multiple      Live Births               Home Medications    Prior to Admission medications   Medication Sig Start Date End Date Taking? Authorizing Provider  albuterol  (VENTOLIN  HFA) 108 (90 Base) MCG/ACT inhaler Inhale 1-2 puffs into the lungs every 6 (six) hours as needed for wheezing or shortness of breath. 12/22/22   Tobie Suzzane POUR, MD  allopurinol  (ZYLOPRIM ) 100 MG tablet Take 2 tablets (200 mg total) by mouth daily. 08/29/23   Tobie Suzzane POUR, MD  atenolol  (TENORMIN ) 50 MG tablet Take 1 tablet (50 mg total) by mouth daily. 08/29/23  Tobie Suzzane POUR, MD  benzonatate  (TESSALON ) 100 MG capsule Take 1 capsule (100 mg total) by mouth 2 (two) times daily as needed for cough. 07/18/23   Antonetta Rollene BRAVO, MD  budesonide -formoterol  (SYMBICORT ) 80-4.5 MCG/ACT inhaler Inhale 2 puffs into the lungs 2 (two) times daily. 07/06/23   Tobie Suzzane POUR, MD  butalbital -acetaminophen -caffeine  (FIORICET) 50-325-40 MG tablet Take 1 tablet by mouth every 12 (twelve) hours as needed for headache or migraine. 08/15/22   Tobie Suzzane POUR, MD  CARTIA  XT 240 MG 24 hr capsule Take 1 capsule (240 mg total) by mouth daily. 08/29/23   Tobie Suzzane POUR, MD  clopidogrel  (PLAVIX ) 75 MG tablet Take 1 tablet (75 mg total) by mouth every evening. 08/29/23   Tobie Suzzane POUR, MD  dexlansoprazole  (DEXILANT ) 60 MG capsule Take 1 capsule (60 mg total) by mouth daily. 08/29/23    Tobie Suzzane POUR, MD  dicyclomine  (BENTYL ) 10 MG capsule Take 1 capsule (10 mg total) by mouth 3 (three) times daily as needed for spasms. 07/06/23   Tobie Suzzane POUR, MD  hydrochlorothiazide  (HYDRODIURIL ) 25 MG tablet Take 1 tablet (25 mg total) by mouth daily. 08/29/23   Tobie Suzzane POUR, MD  methocarbamol  (ROBAXIN ) 500 MG tablet Take 1 tablet (500 mg total) by mouth every 8 (eight) hours as needed for muscle spasms. 09/22/23   Magnant, Charles L, PA-C  montelukast  (SINGULAIR ) 10 MG tablet Take 1 tablet (10 mg total) by mouth at bedtime. 07/06/23   Tobie Suzzane POUR, MD  ondansetron  (ZOFRAN -ODT) 4 MG disintegrating tablet Take 1 tablet (4 mg total) by mouth every 8 (eight) hours as needed. 06/05/23   Leath-Warren, Etta PARAS, NP  atorvastatin  (LIPITOR) 20 MG tablet Take 1 tablet (20 mg total) by mouth at bedtime. 11/25/19 02/18/20  Bari Theodoro FALCON, MD    Family History Family History  Problem Relation Age of Onset   Hypertension Mother    Heart disease Mother        Cardiac Arrest    Hypertension Father    Cancer Father    Hypertension Sister    Cancer Brother        Bladder Cancer    Hypertension Brother    Hypertension Sister     Social History Social History   Tobacco Use   Smoking status: Former    Current packs/day: 0.00    Average packs/day: 0.3 packs/day for 20.0 years (5.0 ttl pk-yrs)    Types: Cigarettes    Start date: 1999    Quit date: 2019    Years since quitting: 6.0   Smokeless tobacco: Never  Vaping Use   Vaping status: Never Used  Substance Use Topics   Alcohol use: Not Currently    Comment: occasionally   Drug use: No     Allergies   Bee venom, Nsaids, and Shellfish allergy   Review of Systems Review of Systems Per HPI  Physical Exam Triage Vital Signs ED Triage Vitals [10/18/23 1357]  Encounter Vitals Group     BP (!) 141/75     Systolic BP Percentile      Diastolic BP Percentile      Pulse Rate (!) 50     Resp 20     Temp 98.4 F (36.9 C)      Temp Source Oral     SpO2 97 %     Weight      Height      Head Circumference      Peak Flow  Pain Score 7     Pain Loc      Pain Education      Exclude from Growth Chart    No data found.  Updated Vital Signs BP (!) 141/75 (BP Location: Right Arm)   Pulse (!) 50   Temp 98.4 F (36.9 C) (Oral)   Resp 20   SpO2 97%   Visual Acuity Right Eye Distance:   Left Eye Distance:   Bilateral Distance:    Right Eye Near:   Left Eye Near:    Bilateral Near:     Physical Exam Vitals and nursing note reviewed.  Constitutional:      General: She is not in acute distress.    Appearance: Normal appearance.  HENT:     Head: Normocephalic.     Right Ear: Tympanic membrane, ear canal and external ear normal.     Left Ear: Tympanic membrane, ear canal and external ear normal.     Nose: Congestion present.     Right Turbinates: Enlarged and swollen.     Left Turbinates: Enlarged and swollen.     Right Sinus: No maxillary sinus tenderness or frontal sinus tenderness.     Left Sinus: No maxillary sinus tenderness or frontal sinus tenderness.     Mouth/Throat:     Lips: Pink.     Mouth: Mucous membranes are moist.     Pharynx: Uvula midline. Postnasal drip present. No pharyngeal swelling, oropharyngeal exudate, posterior oropharyngeal erythema or uvula swelling.  Eyes:     Extraocular Movements: Extraocular movements intact.     Conjunctiva/sclera: Conjunctivae normal.     Pupils: Pupils are equal, round, and reactive to light.  Cardiovascular:     Rate and Rhythm: Regular rhythm. Bradycardia present.     Pulses: Normal pulses.     Heart sounds: Normal heart sounds.  Pulmonary:     Effort: Pulmonary effort is normal. No respiratory distress.     Breath sounds: Normal breath sounds. No stridor. No wheezing, rhonchi or rales.  Abdominal:     General: Bowel sounds are normal.     Palpations: Abdomen is soft.     Tenderness: There is no abdominal tenderness.  Musculoskeletal:      Cervical back: Normal range of motion.  Lymphadenopathy:     Cervical: No cervical adenopathy.  Skin:    General: Skin is warm and dry.  Neurological:     General: No focal deficit present.     Mental Status: She is alert and oriented to person, place, and time.  Psychiatric:        Mood and Affect: Mood normal.        Behavior: Behavior normal.      UC Treatments / Results  Labs (all labs ordered are listed, but only abnormal results are displayed) Labs Reviewed  POC COVID19/FLU A&B COMBO    EKG   Radiology No results found.  Procedures Procedures (including critical care time)  Medications Ordered in UC Medications - No data to display  Initial Impression / Assessment and Plan / UC Course  I have reviewed the triage vital signs and the nursing notes.  Pertinent labs & imaging results that were available during my care of the patient were reviewed by me and considered in my medical decision making (see chart for details).  The COVID/flu test was negative.  Despite negative COVID and flu test, symptoms are consistent with a viral illness.  On exam, lung sounds are clear throughout, room air sats  at 97%.  Supportive care recommendations were provided and discussed with the patient to include fluids, rest, and a brat diet.  Discussed indications with the patient regarding when follow-up be indicated.  Patient was in agreement with this plan of care and verbalized understanding.  All questions were answered.  Patient stable for discharge. Final Clinical Impressions(s) / UC Diagnoses   Final diagnoses:  Viral illness     Discharge Instructions      The COVID/flu test was negative.  I do suspect this is a viral illness. Continue your regular medication. Increase fluids and allow for plenty of rest.  Recommend use of Pedialyte or Gatorlyte to prevent dehydration. May take over-the-counter Tylenol  as needed for pain, fever, or general discomfort. Recommend a brat  diet, this includes bananas, rice, applesauce, and toast while nausea and vomiting persist. For your diarrhea, have given you information on foods that you can eat to help reduce symptoms. A viral illness can last from 7 to 10 days.  If your symptoms suddenly worsen before that time, or extend beyond that time, you may follow-up in this clinic or with your primary care physician for further evaluation. Follow-up as needed.     ED Prescriptions   None    PDMP not reviewed this encounter.   Gilmer Etta PARAS, NP 10/18/23 1452

## 2023-10-27 ENCOUNTER — Ambulatory Visit: Payer: Self-pay | Admitting: Internal Medicine

## 2023-10-27 DIAGNOSIS — B349 Viral infection, unspecified: Secondary | ICD-10-CM | POA: Diagnosis not present

## 2023-10-27 DIAGNOSIS — R110 Nausea: Secondary | ICD-10-CM | POA: Diagnosis not present

## 2023-10-27 NOTE — Telephone Encounter (Signed)
Copied from CRM [id number]. Topic: Clinical - Red Word Triage >> Oct 27, 2023 10:52 AM Jaclyn Brooks wrote: Red Word that prompted transfer to Nurse Triage: The patient is in severe pain in her stomach and feels "not right" she rates it at a 8.   Chief Complaint: abdominal pain Symptoms: pain to abdomen above belly button, back pain, shoulder pain, nausea Frequency: constant now Pertinent Negatives: Patient denies chest pain Disposition: [] 911 / [x] ED /[] Urgent Care (no appt availability in office) / [] Appointment(In office/virtual)/ []  [facility]/ [] Home Care/ [] Refused Recommended Disposition /[] [facility]/ []  Follow-up with PCP Additional Notes: Pt reporting abdominal 8/10 "between moderate and severe" pain in "middle" and "feel sick," "have to grab stomach when it be painful like that." Pt reporting "bacterial infection feeling like this" in past. Pt reporting pain is "constant today," had to take off work today, pain started "maybe, on and off process, couple days ago then seemed like cleared up then came back." Pt confirms pain above belly button, confirms "off and on" pain in "back and shoulder." Pt reporting nausea. Pt groaning on phone call. Advised pt go to ED right now and have another adult drive her. Pt reporting she planned on UC, advised ED for symptoms. Pt verbalized understanding and will head there shortly.  Reason for Disposition  [1] Pain lasts > 10 minutes AND [2] age > 71  Answer Assessment - Initial Assessment Questions 1. LOCATION: "Where does it hurt?"      Middle, Stomach above belly button 2. RADIATION: "Does the pain shoot anywhere else?" (e.g., chest, back)     Back and shoulder 3. ONSET: "When did the pain begin?" (e.g., minutes, hours or days ago)      On and off process, couple days ago then seemed like cleared up then came back 4. SUDDEN: "Gradual or sudden onset?"     Was off and on, now constant 5. PATTERN "Does the pain come and go, or is it  constant?"    - If it comes and goes: "How long does it last?" "Do you have pain now?"     (Note: Comes and goes means the pain is intermittent. It goes away completely between bouts.)    - If constant: "Is it getting better, staying the same, or getting worse?"      (Note: Constant means the pain never goes away completely; most serious pain is constant and gets worse.)      Now constant 6. SEVERITY: "How bad is the pain?"  (e.g., Scale 1-10; mild, moderate, or severe)    - MILD (1-3): Doesn't interfere with normal activities, abdomen soft and not tender to touch..     - MODERATE (4-7): Interferes with normal activities or awakens from sleep, abdomen tender to touch.     - SEVERE (8-10): Excruciating pain, doubled over, unable to do any normal activities.       8/10, between moderate and severe, have to grab stomach when it be painful like that 7. RECURRENT SYMPTOM: "Have you ever had this type of stomach pain before?" If Yes, ask: "When was the last time?" and "What happened that time?"       bacterial infection feeling like this in past 10. OTHER SYMPTOMS: "Do you have any other symptoms?" (e.g., back pain, diarrhea, fever, urination pain, vomiting)       Nausea, feel sick  Protocols used: Abdominal Pain - Upper-A-AH

## 2023-10-30 ENCOUNTER — Ambulatory Visit (HOSPITAL_COMMUNITY)
Admission: RE | Admit: 2023-10-30 | Discharge: 2023-10-30 | Disposition: A | Discharge status: Home / Self Care | Payer: Medicare Other | Source: Ambulatory Visit | Attending: Family Medicine | Admitting: Family Medicine

## 2023-10-30 DIAGNOSIS — N261 Atrophy of kidney (terminal): Secondary | ICD-10-CM | POA: Diagnosis not present

## 2023-10-30 DIAGNOSIS — Z9049 Acquired absence of other specified parts of digestive tract: Secondary | ICD-10-CM | POA: Diagnosis not present

## 2023-10-30 DIAGNOSIS — R1011 Right upper quadrant pain: Secondary | ICD-10-CM | POA: Insufficient documentation

## 2023-10-30 DIAGNOSIS — K838 Other specified diseases of biliary tract: Secondary | ICD-10-CM | POA: Diagnosis not present

## 2023-11-01 ENCOUNTER — Encounter: Payer: Self-pay | Admitting: Internal Medicine

## 2023-11-01 ENCOUNTER — Telehealth (INDEPENDENT_AMBULATORY_CARE_PROVIDER_SITE_OTHER): Payer: Self-pay | Admitting: Otolaryngology

## 2023-11-01 ENCOUNTER — Ambulatory Visit (INDEPENDENT_AMBULATORY_CARE_PROVIDER_SITE_OTHER): Payer: Medicare Other | Admitting: Internal Medicine

## 2023-11-01 VITALS — BP 138/76 | HR 56 | Ht 66.0 in | Wt 187.0 lb

## 2023-11-01 DIAGNOSIS — R195 Other fecal abnormalities: Secondary | ICD-10-CM | POA: Diagnosis not present

## 2023-11-01 DIAGNOSIS — J0111 Acute recurrent frontal sinusitis: Secondary | ICD-10-CM

## 2023-11-01 DIAGNOSIS — R1011 Right upper quadrant pain: Secondary | ICD-10-CM

## 2023-11-01 DIAGNOSIS — K219 Gastro-esophageal reflux disease without esophagitis: Secondary | ICD-10-CM | POA: Diagnosis not present

## 2023-11-01 DIAGNOSIS — N1832 Chronic kidney disease, stage 3b: Secondary | ICD-10-CM | POA: Diagnosis not present

## 2023-11-01 MED ORDER — BENZONATATE 100 MG PO CAPS: 100.0000 mg | ORAL_CAPSULE | Freq: Two times a day (BID) | ORAL | 0 refills | Status: DC | PRN

## 2023-11-01 MED ORDER — AZITHROMYCIN 250 MG PO TABS: ORAL_TABLET | ORAL | 0 refills | Status: AC

## 2023-11-01 NOTE — Patient Instructions (Signed)
Please start taking Azithromycin as prescribed.  Please take Bentyl as needed for abdominal cramps.  Continue taking Dexilant as prescribed.

## 2023-11-01 NOTE — Assessment & Plan Note (Signed)
Refilled Dexilant - has been very expensive, but she does not have optimum effect with other PPI Has tried Omeprazole, Pantoprazole without relief Her epigastric pain can also likely be due to GERD/gastritis

## 2023-11-01 NOTE — Progress Notes (Addendum)
Acute Office Visit  Subjective:    Patient ID: Jaclyn Brooks, female    DOB: 10-Mar-1961, 63 y.o.   MRN: [id number]  Chief Complaint  Patient presents with  . Abdominal Pain    Pain in abdomin, bowel movements smell sweet   . Cough    Dry cough     HPI Patient is in today for complaint of persistent right upper quadrant abdominal pain, which is dull and intermittent.  She has history of cholecystectomy.  She had [place] of RUQ abdomen done, which showed hepatic steatosis and postcholecystectomy changes.  Denies any nausea or vomiting.  She has chronic loose BM, but she reports that she has noticed ?  Sweet smell to it.  Of note, she is taking Bentyl as needed for abdominal cramps.  She also takes Dexilant for GERD, but has had difficulty to take it on regular basis.  She has not had adequate response to other PPIs in the past.  She also reports chronic nasal congestion, but has sore throat and dry cough for the last 2 weeks.  Denies fever or chills currently.    Past Medical History:  Diagnosis Date  . Allergy   . Asthma    Phreesia 11/25/2020  . Chronic kidney disease    Phreesia 11/25/2020  . Depression   . Gout   . Hyperlipidemia   . Hypertension   . 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 CHOLECYSTECTOMY N/A 01/28/2015   Procedure: LAPAROSCOPIC CHOLECYSTECTOMY;  Surgeon: Franky Macho Md, MD;  Location: [facility];  Service: General;  Laterality: N/A;  . excision of bone spurs Bilateral    Big toes  . renal aneurysm Right    right coil-and only has function of left kidney  . TOTAL ABDOMINAL HYSTERECTOMY    . X-STOP IMPLANTATION      Family History  Problem Relation Age of Onset  . Hypertension Mother   . Heart disease Mother        Cardiac Arrest   . Hypertension Father   . Cancer Father   . Hypertension Sister   . Cancer Brother        Bladder Cancer   . Hypertension Brother   . Hypertension Sister     Social  History   Socioeconomic History  . Marital status: Unknown    Spouse name: Not on file  . Number of children: 1  . Years of education: 52  . Highest education level: Some college, no degree  Occupational History  . Occupation: Retired  Tobacco Use  . Smoking status: Former    Current packs/day: 0.00    Average packs/day: 0.3 packs/day for 20.0 years (5.0 ttl pk-yrs)    Types: Cigarettes    Start date: 16    Quit date: 2019    Years since quitting: 6.0  . Smokeless tobacco: Never  Vaping Use  . Vaping status: Never Used  Substance and Sexual Activity  . Alcohol use: Not Currently    Comment: occasionally  . Drug use: No  . Sexual activity: Yes    Birth control/protection: Surgical  Other Topics Concern  . Not on file  Social History Narrative  . Not on file   Social Drivers of Health   Financial Resource Strain: Low Risk  (10/19/2021)   Overall Financial Resource Strain (CARDIA)   . Difficulty of Paying Living Expenses: Not hard at all  Food Insecurity: No Food Insecurity (10/19/2021)  Hunger Vital Sign   . Worried About Programme researcher, broadcasting/film/video in the Last Year: Never true   . Ran Out of Food in the Last Year: Never true  Transportation Needs: No Transportation Needs (10/19/2021)   PRAPARE - Transportation   . Lack of Transportation (Medical): No   . Lack of Transportation (Non-Medical): No  Physical Activity: Sufficiently Active (10/19/2021)   Exercise Vital Sign   . Days of Exercise per Week: 5 days   . Minutes of Exercise per Session: 30 min  Stress: No Stress Concern Present (10/19/2021)   [employer] of Occupational Health - Occupational Stress Questionnaire   . Feeling of Stress : Not at all  Social Connections: Moderately Isolated (10/19/2021)   Social Connection and Isolation Panel [NHANES]   . Frequency of Communication with Friends and Family: More than three times a week   . Frequency of Social Gatherings with Friends and Family: More than three times a  week   . Attends Religious Services: More than 4 times per year   . Active Member of Clubs or Organizations: No   . Attends Banker Meetings: Never   . Marital Status: Never married  Intimate Partner Violence: Not At Risk (10/19/2021)   Humiliation, Afraid, Rape, and Kick questionnaire   . Fear of Current or Ex-Partner: No   . Emotionally Abused: No   . Physically Abused: No   . Sexually Abused: No    Outpatient Medications Prior to Visit  Medication Sig Dispense Refill  . albuterol (VENTOLIN HFA) 108 (90 Base) MCG/ACT inhaler Inhale 1-2 puffs into the lungs every 6 (six) hours as needed for wheezing or shortness of breath. 1 each 2  . allopurinol (ZYLOPRIM) 100 MG tablet Take 2 tablets (200 mg total) by mouth daily. 60 tablet 0  . atenolol (TENORMIN) 50 MG tablet Take 1 tablet (50 mg total) by mouth daily. 90 tablet 1  . budesonide-formoterol (SYMBICORT) 80-4.5 MCG/ACT inhaler Inhale 2 puffs into the lungs 2 (two) times daily. 1 each 2  . butalbital-acetaminophen-caffeine (FIORICET) 50-325-40 MG tablet Take 1 tablet by mouth every 12 (twelve) hours as needed for headache or migraine. 20 tablet 0  . CARTIA XT 240 MG 24 hr capsule Take 1 capsule (240 mg total) by mouth daily. 90 capsule 3  . clopidogrel (PLAVIX) 75 MG tablet Take 1 tablet (75 mg total) by mouth every evening. 90 tablet 3  . dexlansoprazole (DEXILANT) 60 MG capsule Take 1 capsule (60 mg total) by mouth daily. 30 capsule 0  . dicyclomine (BENTYL) 10 MG capsule Take 1 capsule (10 mg total) by mouth 3 (three) times daily as needed for spasms. 30 capsule 1  . hydrochlorothiazide (HYDRODIURIL) 25 MG tablet Take 1 tablet (25 mg total) by mouth daily. 90 tablet 1  . methocarbamol (ROBAXIN) 500 MG tablet Take 1 tablet (500 mg total) by mouth every 8 (eight) hours as needed for muscle spasms. 30 tablet 1  . montelukast (SINGULAIR) 10 MG tablet Take 1 tablet (10 mg total) by mouth at bedtime. 30 tablet 3  . ondansetron  (ZOFRAN-ODT) 4 MG disintegrating tablet Take 1 tablet (4 mg total) by mouth every 8 (eight) hours as needed. 20 tablet 0  . benzonatate (TESSALON) 100 MG capsule Take 1 capsule (100 mg total) by mouth 2 (two) times daily as needed for cough. 20 capsule 0   No facility-administered medications prior to visit.    Allergies  Allergen Reactions  . Bee Venom Hives  .  Nsaids     Kidney disease   . Shellfish Allergy Itching    Review of Systems  Constitutional:  Negative for chills and fever.  HENT:  Positive for congestion, postnasal drip and sinus pressure. Negative for sinus pain.   Eyes:  Negative for pain and discharge.  Respiratory:  Negative for cough and shortness of breath.   Cardiovascular:  Negative for chest pain and palpitations.  Gastrointestinal:  Positive for abdominal pain. Negative for diarrhea and vomiting.  Endocrine: Negative for polydipsia and polyuria.  Genitourinary:  Negative for dysuria and hematuria.  Musculoskeletal:  Positive for arthralgias, back pain and neck pain. Negative for neck stiffness.  Skin:  Negative for rash.  Neurological:  Negative for dizziness and weakness.  Psychiatric/Behavioral:  Positive for agitation. Negative for behavioral problems. The patient is nervous/anxious.        Objective:    Physical Exam Vitals reviewed.  Constitutional:      General: She is not in acute distress.    Appearance: She is not diaphoretic.  HENT:     Head: Normocephalic and atraumatic.     Mouth/Throat:     Mouth: Mucous membranes are moist.  Eyes:     General: No scleral icterus.    Extraocular Movements: Extraocular movements intact.  Cardiovascular:     Rate and Rhythm: Normal rate and regular rhythm.     Pulses: Normal pulses.     Heart sounds: Normal heart sounds. No murmur heard. Pulmonary:     Breath sounds: Normal breath sounds. No wheezing or rales.  Musculoskeletal:     Cervical back: Neck supple. No tenderness.     Right lower leg: No  edema.     Left lower leg: No edema.  Skin:    General: Skin is warm.     Findings: No rash.     Comments: Circular mole over left neck/shoulder area - image in Media  Neurological:     General: No focal deficit present.     Mental Status: She is alert and oriented to person, place, and time.  Psychiatric:        Mood and Affect: Mood normal.        Behavior: Behavior normal.    BP 138/76 (BP Location: Left Arm)   Pulse (!) 56   Ht 5\' 6"  (1.676 m)   Wt 187 lb (84.8 kg)   SpO2 97%   BMI 30.18 kg/m  Wt Readings from Last 3 Encounters:  11/01/23 187 lb (84.8 kg)  09/19/23 181 lb 0.6 oz (82.1 kg)  08/29/23 180 lb 3.2 oz (81.7 kg)        Assessment & Plan:   Problem List Items Addressed This Visit       Respiratory   Acute recurrent frontal sinusitis   Started empiric azithromycin as she has persistent symptoms despite symptomatic treatment Nasal saline spray or Flonase as needed Advised to use humidifier or vaporizer for nasal congestion      Relevant Medications   azithromycin (ZITHROMAX) 250 MG tablet   benzonatate (TESSALON) 100 MG capsule     Digestive   GERD (gastroesophageal reflux disease)   Refilled Dexilant - has been very expensive, but she does not have optimum effect with other PPI Has tried Omeprazole, Pantoprazole without relief Her epigastric pain can also likely be due to GERD/gastritis        Genitourinary   CKD (chronic kidney disease) stage 3, GFR 30-59 ml/min (HCC)   Last BMP showed GFT around  34 in 12/24 Follows up with Nephrologist - Dr. Hyman Hopes, last visit note reviewed Avoid nephrotoxic agents Maintain adequate hydration        Other   Loose bowel movements   Has abdominal cramping as well Bentyl as needed for abdominal cramping Advised to take Benefiber If persistent, will refer to GI      RUQ pain - Primary   Chronic, intermittent RUQ abdominal pain Recent US of abdomen was unremarkable except hepatic steatosis Needs to get  CMP Needs to continue Dexilant on a regular basis Recent viral infection/gastroenteritis can also lead to worsening of abdominal pain/cramping If persistent, will refer to GI        Meds ordered this encounter  Medications  . azithromycin (ZITHROMAX) 250 MG tablet    Sig: Take 2 tablets on day 1, then 1 tablet daily on days 2 through 5    Dispense:  6 tablet    Refill:  0  . benzonatate (TESSALON) 100 MG capsule    Sig: Take 1 capsule (100 mg total) by mouth 2 (two) times daily as needed for cough.    Dispense:  30 capsule    Refill:  0     Alazne Quant Concha Se, MD

## 2023-11-01 NOTE — Assessment & Plan Note (Addendum)
Last BMP showed GFT around 34 in 12/24 Follows up with Nephrologist - Dr. Hyman Hopes, last visit note reviewed Avoid nephrotoxic agents Maintain adequate hydration

## 2023-11-01 NOTE — Assessment & Plan Note (Signed)
Has abdominal cramping as well Bentyl as needed for abdominal cramping Advised to take Benefiber If persistent, will refer to GI

## 2023-11-01 NOTE — Assessment & Plan Note (Signed)
Started empiric azithromycin as she has persistent symptoms despite symptomatic treatment Nasal saline spray or Flonase as needed Advised to use humidifier or vaporizer for nasal congestion

## 2023-11-01 NOTE — Assessment & Plan Note (Signed)
Chronic, intermittent RUQ abdominal pain Recent US of abdomen was unremarkable except hepatic steatosis Needs to get CMP Needs to continue Dexilant on a regular basis Recent viral infection/gastroenteritis can also lead to worsening of abdominal pain/cramping If persistent, will refer to GI

## 2023-11-01 NOTE — Telephone Encounter (Signed)
Reminder Call: Date: 11/02/2023 Status: Sch  Time: 9:00 AM [address] [place], [place] [address] Confirmed time and location w/patient.

## 2023-11-02 ENCOUNTER — Institutional Professional Consult (permissible substitution) (INDEPENDENT_AMBULATORY_CARE_PROVIDER_SITE_OTHER): Payer: Medicare Other

## 2023-11-21 ENCOUNTER — Other Ambulatory Visit: Payer: Self-pay

## 2023-11-21 DIAGNOSIS — R945 Abnormal results of liver function studies: Secondary | ICD-10-CM

## 2023-11-24 ENCOUNTER — Ambulatory Visit
Admission: EM | Admit: 2023-11-24 | Discharge: 2023-11-24 | Disposition: A | Discharge status: Home / Self Care | Payer: Medicare Other | Attending: Nurse Practitioner | Admitting: Nurse Practitioner

## 2023-11-24 DIAGNOSIS — J069 Acute upper respiratory infection, unspecified: Secondary | ICD-10-CM

## 2023-11-24 LAB — POC COVID19/FLU A&B COMBO
Covid Antigen, POC: NEGATIVE
Influenza A Antigen, POC: NEGATIVE
Influenza B Antigen, POC: NEGATIVE

## 2023-11-24 MED ORDER — CETIRIZINE HCL 10 MG PO TABS: 10.0000 mg | ORAL_TABLET | Freq: Every day | ORAL | 0 refills | Status: DC

## 2023-11-24 MED ORDER — PROMETHAZINE-DM 6.25-15 MG/5ML PO SYRP: 5.0000 mL | ORAL_SOLUTION | Freq: Four times a day (QID) | ORAL | 0 refills | Status: DC | PRN

## 2023-11-24 NOTE — ED Provider Notes (Signed)
[facility]    CSN: [id number] Arrival date & time: 11/24/23  4098      History   Chief Complaint No chief complaint on file.   HPI Jaclyn Brooks is a 63 y.o. female.   The history is provided by the patient.   The patient presents with a 3-day history of dry throat, headaches, and nasal congestion.  Patient denies fever, chills, ear pain, ear drainage, wheezing, difficulty breathing, chest pain, abdominal pain, nausea, vomiting, diarrhea, or rash.  Patient states she has been taking over-the-counter cough and cold medications for her symptoms.  Patient states that she is concerned that her current symptoms are malingering from the symptoms that she had in January.  Patient also states that she has had close sick contacts at work.  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[facility])     Patient Active Problem List   Diagnosis Date Noted   Flank pain 09/19/2023   RUQ pain 09/19/2023   Chronic sinusitis 08/29/2023   Enlarged skin mole 08/29/2023   Dysphagia 07/23/2023   Reactive airway disease 07/06/2023   Loose bowel movements 07/06/2023   Primary osteoarthritis of left hip 05/05/2023   Dysuria 04/16/2023   Diarrhea of presumed infectious origin 02/01/2023   Intractable migraine with aura without status migrainosus 08/15/2022   Traumatic hematoma of right lower leg 07/13/2022   History of total abdominal hysterectomy 07/04/2022   GAD (generalized anxiety disorder) 04/28/2022   Caregiver stress 04/28/2022   Chronic left-sided low back pain without sciatica 04/13/2022   Contact dermatitis 04/13/2022   Primary osteoarthritis of right knee 03/09/2022   Refused influenza vaccine 12/02/2021   Encounter for general adult medical examination with abnormal findings 11/30/2021   Epidermal cyst  11/30/2021   Acute recurrent frontal sinusitis 09/21/2021   Allergic rhinitis 08/29/2021   Nausea 08/27/2021   Hemorrhoids 07/07/2021   Gout 03/25/2021   Type 2 diabetes mellitus with other specified complication (HCC) 07/14/2020   Primary osteoarthritis of right foot 05/12/2020   GERD (gastroesophageal reflux disease) 05/31/2019   Solitary kidney, acquired 05/10/2019   Carotid arterial disease (HCC) 12/11/2018   COPD, 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/01/2018   CKD (chronic kidney disease) stage 3, GFR 30-59 ml/min (HCC) 10/01/2018   Aortic valve sclerosis 10/01/2018   Hypertension 09/11/2018    Past Surgical History:  Procedure Laterality Date   CHOLECYSTECTOMY N/A 01/28/2015   Procedure: LAPAROSCOPIC CHOLECYSTECTOMY;  Surgeon: Franky Macho Md, MD;  Location: [facility];  Service: General;  Laterality: N/A;   excision of bone spurs Bilateral    Big toes   renal aneurysm Right    right coil-and only has function of left kidney   TOTAL ABDOMINAL HYSTERECTOMY     X-STOP IMPLANTATION      OB History     Gravida  1   Para  1   Term      Preterm  1   AB      Living         SAB      IAB      Ectopic      Multiple      Live Births               Home Medications  Prior to Admission medications   Medication Sig Start Date End Date Taking? Authorizing Provider  cetirizine (ZYRTEC) 10 MG tablet Take 1 tablet (10 mg total) by mouth daily. 11/24/23  Yes Leath-Warren, Sadie Haber, NP  promethazine-dextromethorphan (PROMETHAZINE-DM) 6.25-15 MG/5ML syrup Take 5 mLs by mouth 4 (four) times daily as needed. 11/24/23  Yes Leath-Warren, Sadie Haber, NP  albuterol (VENTOLIN HFA) 108 (90 Base) MCG/ACT inhaler Inhale 1-2 puffs into the lungs every 6 (six) hours as needed for wheezing or shortness of breath. 12/22/22   Anabel Halon, MD  allopurinol (ZYLOPRIM) 100 MG tablet Take 2 tablets (200 mg total) by mouth daily. 08/29/23   Anabel Halon, MD  atenolol (TENORMIN) 50 MG tablet Take 1  tablet (50 mg total) by mouth daily. 08/29/23   Anabel Halon, MD  benzonatate (TESSALON) 100 MG capsule Take 1 capsule (100 mg total) by mouth 2 (two) times daily as needed for cough. 11/01/23   Anabel Halon, MD  budesonide-formoterol (SYMBICORT) 80-4.5 MCG/ACT inhaler Inhale 2 puffs into the lungs 2 (two) times daily. 07/06/23   Anabel Halon, MD  butalbital-acetaminophen-caffeine (FIORICET) 4845012319 MG tablet Take 1 tablet by mouth every 12 (twelve) hours as needed for headache or migraine. 08/15/22   Patel, Earlie Lou, MD  CARTIA XT 240 MG 24 hr capsule Take 1 capsule (240 mg total) by mouth daily. 08/29/23   Anabel Halon, MD  clopidogrel (PLAVIX) 75 MG tablet Take 1 tablet (75 mg total) by mouth every evening. 08/29/23   Anabel Halon, MD  dexlansoprazole (DEXILANT) 60 MG capsule Take 1 capsule (60 mg total) by mouth daily. 08/29/23   Anabel Halon, MD  dicyclomine (BENTYL) 10 MG capsule Take 1 capsule (10 mg total) by mouth 3 (three) times daily as needed for spasms. 07/06/23   Anabel Halon, MD  hydrochlorothiazide (HYDRODIURIL) 25 MG tablet Take 1 tablet (25 mg total) by mouth daily. 08/29/23   Anabel Halon, MD  methocarbamol (ROBAXIN) 500 MG tablet Take 1 tablet (500 mg total) by mouth every 8 (eight) hours as needed for muscle spasms. 09/22/23   Magnant, Charles L, PA-C  montelukast (SINGULAIR) 10 MG tablet Take 1 tablet (10 mg total) by mouth at bedtime. 07/06/23   Anabel Halon, MD  ondansetron (ZOFRAN-ODT) 4 MG disintegrating tablet Take 1 tablet (4 mg total) by mouth every 8 (eight) hours as needed. 06/05/23   Leath-Warren, Sadie Haber, NP  atorvastatin (LIPITOR) 20 MG tablet Take 1 tablet (20 mg total) by mouth at bedtime. 11/25/19 02/18/20  Salley Scarlet, MD    Family History Family History  Problem Relation Age of Onset   Hypertension Mother    Heart disease Mother        Cardiac Arrest    Hypertension Father    Cancer Father    Hypertension Sister    Cancer  Brother        Bladder Cancer    Hypertension Brother    Hypertension Sister     Social History Social History   Tobacco Use   Smoking status: Former    Current packs/day: 0.00    Average packs/day: 0.3 packs/day for 20.0 years (5.0 ttl pk-yrs)    Types: Cigarettes    Start date: 1999    Quit date: 2019    Years since quitting: 6.1   Smokeless tobacco: Never  Vaping Use   Vaping status: Never Used  Substance Use Topics   Alcohol use: Not Currently  Comment: occasionally   Drug use: No     Allergies   Bee venom, Nsaids, and Shellfish allergy   Review of Systems Review of Systems Per HPI  Physical Exam Triage Vital Signs ED Triage Vitals  Encounter Vitals Group     BP 11/24/23 1926 (!) 167/84     Systolic BP Percentile --      Diastolic BP Percentile --      Pulse Rate 11/24/23 1926 (!) 43     Resp 11/24/23 1926 18     Temp 11/24/23 1926 98.6 F (37 C)     Temp Source 11/24/23 1926 Oral     SpO2 11/24/23 1926 96 %     Weight --      Height --      Head Circumference --      Peak Flow --      Pain Score 11/24/23 1925 5     Pain Loc --      Pain Education --      Exclude from Growth Chart --    No data found.  Updated Vital Signs BP (!) 167/84 (BP Location: Right Arm)   Pulse (!) 43   Temp 98.6 F (37 C) (Oral)   Resp 18   SpO2 96%   Visual Acuity Right Eye Distance:   Left Eye Distance:   Bilateral Distance:    Right Eye Near:   Left Eye Near:    Bilateral Near:     Physical Exam Vitals and nursing note reviewed.  Constitutional:      General: She is not in acute distress.    Appearance: Normal appearance.  HENT:     Head: Normocephalic.     Right Ear: Tympanic membrane, ear canal and external ear normal.     Left Ear: Tympanic membrane, ear canal and external ear normal.     Nose: Congestion present.     Mouth/Throat:     Lips: Pink.     Mouth: Mucous membranes are moist.     Pharynx: Oropharynx is clear. Uvula midline.  Postnasal drip present. No pharyngeal swelling, oropharyngeal exudate, posterior oropharyngeal erythema or uvula swelling.     Comments: Cobblestoning present to posterior oropharynx  Eyes:     Extraocular Movements: Extraocular movements intact.     Conjunctiva/sclera: Conjunctivae normal.     Pupils: Pupils are equal, round, and reactive to light.  Cardiovascular:     Rate and Rhythm: Normal rate and regular rhythm.     Pulses: Normal pulses.     Heart sounds: Normal heart sounds.  Pulmonary:     Effort: Pulmonary effort is normal. No respiratory distress.     Breath sounds: Normal breath sounds. No stridor. No wheezing, rhonchi or rales.  Abdominal:     General: Bowel sounds are normal.     Palpations: Abdomen is soft.     Tenderness: There is no abdominal tenderness.  Musculoskeletal:     Cervical back: Normal range of motion.  Lymphadenopathy:     Cervical: No cervical adenopathy.  Skin:    General: Skin is warm and dry.  Neurological:     General: No focal deficit present.     Mental Status: She is alert and oriented to person, place, and time.  Psychiatric:        Mood and Affect: Mood normal.        Behavior: Behavior normal.      UC Treatments / Results  Labs (all labs ordered are listed,  but only abnormal results are displayed) Labs Reviewed  POC COVID19/FLU A&B COMBO - Normal    EKG   Radiology No results found.  Procedures Procedures (including critical care time)  Medications Ordered in UC Medications - No data to display  Initial Impression / Assessment and Plan / UC Course  I have reviewed the triage vital signs and the nursing notes.  Pertinent labs & imaging results that were available during my care of the patient were reviewed by me and considered in my medical decision making (see chart for details).  COVID/flu test was negative.  Symptoms consistent with a viral URI with cough.  Symptomatic treatment provided with Promethazine DM for the  cough, and cetirizine 10 mg for nasal congestion and runny nose.  Patient advised to continue Flonase she is currently taking.  Supportive care recommendations were provided and discussed with the patient to include fluids, rest, over-the-counter analgesics, and use of a humidifier at nighttime during sleep.  Discussed indications regarding follow-up.  Patient was in agreement with this plan of care and verbalizes understanding.  All questions were answered.  Patient stable for discharge.  Work note was provided.  Final Clinical Impressions(s) / UC Diagnoses   Final diagnoses:  Viral URI with cough     Discharge Instructions      The COVID/flu test was negative. Take medication as prescribed. Increase fluids and allow for plenty of rest.  Try to drink at least 8-10 8 ounce glasses of water daily.  You can also use Pedialyte or Gatorade to help prevent dehydration. May take over-the-counter Tylenol as needed for pain, fever, or general discomfort. May use normal saline nasal spray throughout the day for nasal congestion and runny nose. For the cough, it may be helpful for her to use a humidifier in the bedroom at nighttime during sleep and sleeping elevated on pillows while cough symptoms persist. Symptoms should improve over the next 7 to 10 days.  If symptoms fail to improve, or appear to be worsening, you may follow-up in this clinic or with your primary care physician for further evaluation. Follow-up as needed.      ED Prescriptions     Medication Sig Dispense Auth. Provider   cetirizine (ZYRTEC) 10 MG tablet Take 1 tablet (10 mg total) by mouth daily. 30 tablet Leath-Warren, Sadie Haber, NP   promethazine-dextromethorphan (PROMETHAZINE-DM) 6.25-15 MG/5ML syrup Take 5 mLs by mouth 4 (four) times daily as needed. 118 mL Leath-Warren, Sadie Haber, NP      PDMP not reviewed this encounter.   Abran Cantor, NP 11/24/23 2001

## 2023-11-24 NOTE — ED Triage Notes (Signed)
Pt reports she is having headaches, dry throat, congestion, and mucus x 3 days

## 2023-11-24 NOTE — Discharge Instructions (Addendum)
The COVID/flu test was negative. Take medication as prescribed. Increase fluids and allow for plenty of rest.  Try to drink at least 8-10 8 ounce glasses of water daily.  You can also use Pedialyte or Gatorade to help prevent dehydration. May take over-the-counter Tylenol as needed for pain, fever, or general discomfort. May use normal saline nasal spray throughout the day for nasal congestion and runny nose. For the cough, it may be helpful for her to use a humidifier in the bedroom at nighttime during sleep and sleeping elevated on pillows while cough symptoms persist. Symptoms should improve over the next 7 to 10 days.  If symptoms fail to improve, or appear to be worsening, you may follow-up in this clinic or with your primary care physician for further evaluation. Follow-up as needed.

## 2023-11-28 ENCOUNTER — Telehealth: Payer: Self-pay

## 2023-11-28 ENCOUNTER — Other Ambulatory Visit: Payer: Self-pay

## 2023-11-28 DIAGNOSIS — B349 Viral infection, unspecified: Secondary | ICD-10-CM | POA: Diagnosis not present

## 2023-11-28 NOTE — Telephone Encounter (Unsigned)
 Copied from CRM [id number]. Topic: Appointments - Appointment Scheduling >> Nov 27, 2023  1:02 PM Kristie Cowman wrote: The patient has a cold and cough and would like something prescribed over the phone.  I called the [facility], but staff was unavailable.  She would like someone from the office to call her back.

## 2023-11-28 NOTE — Telephone Encounter (Signed)
 Please advise when plan of treatment is approved. Thank you  MA spoke to pt. Over the phone she stated her current irritating symptoms consist of :   Cough lasting 3-4 days she states she is coughing up thick white mucus that is making her chest hurt and causing trouble catching breath. Nasal congestion, she is constantly blowing her nose (same thick white mucus.) Dry, sore throat, that is raw and scratchy Headaches.  Exhaustion Body aches   She is on the way to the office now to obtain the Covid/ RSV and FLU swab.  She is looking for a medication / plan of treatment for alleviation of symptoms.

## 2023-11-29 ENCOUNTER — Other Ambulatory Visit: Payer: Self-pay | Admitting: Internal Medicine

## 2023-11-29 DIAGNOSIS — J0111 Acute recurrent frontal sinusitis: Secondary | ICD-10-CM

## 2023-11-29 MED ORDER — NOREL AD 4-10-325 MG PO TABS: 1.0000 | ORAL_TABLET | Freq: Three times a day (TID) | ORAL | 0 refills | Status: DC | PRN

## 2023-11-29 MED ORDER — GUAIFENESIN-CODEINE 100-10 MG/5ML PO SOLN: 5.0000 mL | Freq: Three times a day (TID) | ORAL | 0 refills | Status: DC | PRN

## 2023-11-29 MED ORDER — AMOXICILLIN-POT CLAVULANATE 875-125 MG PO TABS: 1.0000 | ORAL_TABLET | Freq: Two times a day (BID) | ORAL | 0 refills | Status: DC

## 2023-11-30 LAB — COVID-19, FLU A+B AND RSV
Influenza A, NAA: NOT DETECTED
Influenza B, NAA: NOT DETECTED
RSV, NAA: NOT DETECTED
SARS-CoV-2, NAA: NOT DETECTED

## 2023-11-30 NOTE — Telephone Encounter (Signed)
 Spoke to pt. Informed her of medications at pharmacy and to contact us if symptoms do not subside by the time she has completed her course of treatment. She was able to review Dr. Eliane Decree message and understands the plan of treatment and DX .

## 2023-11-30 NOTE — Telephone Encounter (Signed)
 Physician reviewed and medication/ plan of treatment has been prescribed.

## 2023-12-18 ENCOUNTER — Ambulatory Visit: Payer: Self-pay | Admitting: Internal Medicine

## 2023-12-18 ENCOUNTER — Ambulatory Visit
Admission: RE | Admit: 2023-12-18 | Discharge: 2023-12-18 | Disposition: A | Discharge status: Home / Self Care | Source: Ambulatory Visit | Attending: Nurse Practitioner | Admitting: Nurse Practitioner

## 2023-12-18 VITALS — BP 155/94 | HR 50 | Temp 98.6°F | Resp 16

## 2023-12-18 DIAGNOSIS — Z566 Other physical and mental strain related to work: Secondary | ICD-10-CM | POA: Diagnosis not present

## 2023-12-18 MED ORDER — HYDROXYZINE HCL 10 MG PO TABS: 10.0000 mg | ORAL_TABLET | Freq: Three times a day (TID) | ORAL | 0 refills | Status: AC | PRN

## 2023-12-18 NOTE — ED Provider Notes (Signed)
 [facility]    CSN: [id number] Arrival date & time: 12/18/23  1357      History   Chief Complaint Chief Complaint  Patient presents with   Anxiety    Entered by patient    HPI Jaclyn Brooks is a 63 y.o. female.   Patient presents today for concern for stress at work.  Reports an incident at work recently that made her feel uncomfortable.  Now whenever she thinks about work or when she has to work the next day, she develops abdominal pain, nausea, diarrhea, and generally feels unwell.  She feels anxious and is not sleeping well at night, is having nightmares regarding the incident.  She relates this incident to a similar childhood incident.  She has an appointment with her PCP later this week.  She is interested in establishing care with a counselor.      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[facility])     Patient Active Problem List   Diagnosis Date Noted   Flank pain 09/19/2023   RUQ pain 09/19/2023   Chronic sinusitis 08/29/2023   Enlarged skin mole 08/29/2023   Dysphagia 07/23/2023   Reactive airway disease 07/06/2023   Loose bowel movements 07/06/2023   Primary osteoarthritis of left hip 05/05/2023   Dysuria 04/16/2023   Diarrhea of presumed infectious origin 02/01/2023   Intractable migraine with aura without status migrainosus 08/15/2022   Traumatic hematoma of right lower leg 07/13/2022   History of total abdominal hysterectomy 07/04/2022   GAD (generalized anxiety disorder) 04/28/2022   Caregiver stress 04/28/2022   Chronic left-sided low back pain without sciatica 04/13/2022   Contact dermatitis 04/13/2022   Primary osteoarthritis of right knee 03/09/2022   Refused influenza vaccine 12/02/2021   Encounter for general adult medical examination with abnormal findings  11/30/2021   Epidermal cyst 11/30/2021   Acute recurrent frontal sinusitis 09/21/2021   Allergic rhinitis 08/29/2021   Nausea 08/27/2021   Hemorrhoids 07/07/2021   Gout 03/25/2021   Type 2 diabetes mellitus with other specified complication (HCC) 07/14/2020   Primary osteoarthritis of right foot 05/12/2020   GERD (gastroesophageal reflux disease) 05/31/2019   Solitary kidney, acquired 05/10/2019   Carotid arterial disease (HCC) 12/11/2018   COPD, 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/01/2018   CKD (chronic kidney disease) stage 3, GFR 30-59 ml/min (HCC) 10/01/2018   Aortic valve sclerosis 10/01/2018   Hypertension 09/11/2018    Past Surgical History:  Procedure Laterality Date   CHOLECYSTECTOMY N/A 01/28/2015   Procedure: LAPAROSCOPIC CHOLECYSTECTOMY;  Surgeon: Franky Macho Md, MD;  Location: [facility];  Service: General;  Laterality: N/A;   excision of bone spurs Bilateral    Big toes   renal aneurysm Right    right coil-and only has function of left kidney   TOTAL ABDOMINAL HYSTERECTOMY     X-STOP IMPLANTATION      OB History     Gravida  1   Para  1   Term      Preterm  1   AB      Living         SAB      IAB      Ectopic      Multiple      Live  Births               Home Medications    Prior to Admission medications   Medication Sig Start Date End Date Taking? Authorizing Provider  hydrOXYzine (ATARAX) 10 MG tablet Take 1 tablet (10 mg total) by mouth every 8 (eight) hours as needed for anxiety. 12/18/23  Yes Valentino Nose, NP  albuterol (VENTOLIN HFA) 108 (90 Base) MCG/ACT inhaler Inhale 1-2 puffs into the lungs every 6 (six) hours as needed for wheezing or shortness of breath. 12/22/22   Anabel Halon, MD  allopurinol (ZYLOPRIM) 100 MG tablet Take 2 tablets (200 mg total) by mouth daily. 08/29/23   Anabel Halon, MD  amoxicillin-clavulanate (AUGMENTIN) 875-125 MG tablet Take 1 tablet by mouth 2 (two) times daily. 11/29/23   Anabel Halon, MD  atenolol  (TENORMIN) 50 MG tablet Take 1 tablet (50 mg total) by mouth daily. 08/29/23   Anabel Halon, MD  benzonatate (TESSALON) 100 MG capsule Take 1 capsule (100 mg total) by mouth 2 (two) times daily as needed for cough. 11/01/23   Anabel Halon, MD  budesonide-formoterol (SYMBICORT) 80-4.5 MCG/ACT inhaler Inhale 2 puffs into the lungs 2 (two) times daily. 07/06/23   Anabel Halon, MD  butalbital-acetaminophen-caffeine (FIORICET) [phone] MG tablet Take 1 tablet by mouth every 12 (twelve) hours as needed for headache or migraine. 08/15/22   Patel, Earlie Lou, MD  CARTIA XT 240 MG 24 hr capsule Take 1 capsule (240 mg total) by mouth daily. 08/29/23   Anabel Halon, MD  cetirizine (ZYRTEC) 10 MG tablet Take 1 tablet (10 mg total) by mouth daily. 11/24/23   Leath-Warren, Sadie Haber, NP  Chlorphen-PE-Acetaminophen (NOREL AD) 4-10-325 MG TABS Take 1 tablet by mouth 3 (three) times daily as needed. 11/29/23   Anabel Halon, MD  clopidogrel (PLAVIX) 75 MG tablet Take 1 tablet (75 mg total) by mouth every evening. 08/29/23   Anabel Halon, MD  dexlansoprazole (DEXILANT) 60 MG capsule Take 1 capsule (60 mg total) by mouth daily. 08/29/23   Anabel Halon, MD  dicyclomine (BENTYL) 10 MG capsule Take 1 capsule (10 mg total) by mouth 3 (three) times daily as needed for spasms. 07/06/23   Anabel Halon, MD  guaiFENesin-codeine 100-10 MG/5ML syrup Take 5 mLs by mouth 3 (three) times daily as needed for cough. 11/29/23   Anabel Halon, MD  hydrochlorothiazide (HYDRODIURIL) 25 MG tablet Take 1 tablet (25 mg total) by mouth daily. 08/29/23   Anabel Halon, MD  methocarbamol (ROBAXIN) 500 MG tablet Take 1 tablet (500 mg total) by mouth every 8 (eight) hours as needed for muscle spasms. 09/22/23   Magnant, Charles L, PA-C  montelukast (SINGULAIR) 10 MG tablet Take 1 tablet (10 mg total) by mouth at bedtime. 07/06/23   Anabel Halon, MD  ondansetron (ZOFRAN-ODT) 4 MG disintegrating tablet Take 1 tablet (4 mg total)  by mouth every 8 (eight) hours as needed. 06/05/23   Leath-Warren, Sadie Haber, NP  atorvastatin (LIPITOR) 20 MG tablet Take 1 tablet (20 mg total) by mouth at bedtime. 11/25/19 02/18/20  Salley Scarlet, MD    Family History Family History  Problem Relation Age of Onset   Hypertension Mother    Heart disease Mother        Cardiac Arrest    Hypertension Father    Cancer Father    Hypertension Sister    Cancer Brother  Bladder Cancer    Hypertension Brother    Hypertension Sister     Social History Social History   Tobacco Use   Smoking status: Former    Current packs/day: 0.00    Average packs/day: 0.3 packs/day for 20.0 years (5.0 ttl pk-yrs)    Types: Cigarettes    Start date: 1999    Quit date: 2019    Years since quitting: 6.1   Smokeless tobacco: Never  Vaping Use   Vaping status: Never Used  Substance Use Topics   Alcohol use: Not Currently    Comment: occasionally   Drug use: No     Allergies   Bee venom, Nsaids, and Shellfish allergy   Review of Systems Review of Systems Per HPI  Physical Exam Triage Vital Signs ED Triage Vitals  Encounter Vitals Group     BP 12/18/23 1434 (!) 155/94     Systolic BP Percentile --      Diastolic BP Percentile --      Pulse Rate 12/18/23 1434 (!) 50     Resp 12/18/23 1434 16     Temp 12/18/23 1434 98.6 F (37 C)     Temp Source 12/18/23 1434 Oral     SpO2 12/18/23 1434 96 %     Weight --      Height --      Head Circumference --      Peak Flow --      Pain Score 12/18/23 1436 3     Pain Loc --      Pain Education --      Exclude from Growth Chart --    No data found.  Updated Vital Signs BP (!) 155/94 (BP Location: Right Arm)   Pulse (!) 50   Temp 98.6 F (37 C) (Oral)   Resp 16   SpO2 96%   Visual Acuity Right Eye Distance:   Left Eye Distance:   Bilateral Distance:    Right Eye Near:   Left Eye Near:    Bilateral Near:     Physical Exam Vitals and nursing note reviewed.   Constitutional:      General: She is not in acute distress.    Appearance: Normal appearance. She is not toxic-appearing.  HENT:     Head: Normocephalic and atraumatic.     Mouth/Throat:     Mouth: Mucous membranes are moist.     Pharynx: Oropharynx is clear.  Pulmonary:     Effort: Pulmonary effort is normal. No respiratory distress.  Skin:    General: Skin is warm and dry.     Capillary Refill: Capillary refill takes less than 2 seconds.     Coloration: Skin is not jaundiced or pale.     Findings: No erythema.  Neurological:     Mental Status: She is alert and oriented to person, place, and time.  Psychiatric:        Behavior: Behavior is cooperative.      UC Treatments / Results  Labs (all labs ordered are listed, but only abnormal results are displayed) Labs Reviewed - No data to display  EKG   Radiology No results found.  Procedures Procedures (including critical care time)  Medications Ordered in UC Medications - No data to display  Initial Impression / Assessment and Plan / UC Course  I have reviewed the triage vital signs and the nursing notes.  Pertinent labs & imaging results that were available during my care of the patient were reviewed by  me and considered in my medical decision making (see chart for details).   Patient is well-appearing, afebrile, not tachycardic, not tachypneic, oxygenating well on room air.  She is mildly hypertensive in urgent care today.  1. Stress at work Resources given to establish care with counseling/behavioral health We discussed hydroxyzine and patient would like to try this medication Recommended close follow up with PCP as planned   The patient was given the opportunity to ask questions.  All questions answered to their satisfaction.  The patient is in agreement to this plan.    Final Clinical Impressions(s) / UC Diagnoses   Final diagnoses:  Stress at work     Discharge Instructions      As we discussed, you  can take hydroxyzine 10 mg as needed to help with anxiety related to the stress at work.  Please follow up with your PCP as planned.  Also recommend follow up with a therapist.  You can call the behavioral medicine group in [place] below to set up an appointment to talk to a counselor.     ED Prescriptions     Medication Sig Dispense Auth. Provider   hydrOXYzine (ATARAX) 10 MG tablet Take 1 tablet (10 mg total) by mouth every 8 (eight) hours as needed for anxiety. 20 tablet Valentino Nose, NP      PDMP not reviewed this encounter.   Valentino Nose, NP 12/18/23 [phone]

## 2023-12-18 NOTE — Discharge Instructions (Addendum)
 As we discussed, you can take hydroxyzine 10 mg as needed to help with anxiety related to the stress at work.  Please follow up with your PCP as planned.  Also recommend follow up with a therapist.  You can call the behavioral medicine group in  below to set up an appointment to talk to a counselor.
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
## 2023-12-18 NOTE — ED Triage Notes (Signed)
 Pt reports she has been under a lot of stress lately with work and has been experiencing, abdominal pain, nausea and general unwell feeling. When going into the work place, after an incident that occurred at work that made her uncomfortable.

## 2023-12-19 ENCOUNTER — Ambulatory Visit
Admission: RE | Admit: 2023-12-19 | Discharge: 2023-12-19 | Disposition: A | Discharge status: Home / Self Care | Source: Ambulatory Visit | Attending: Internal Medicine | Admitting: Internal Medicine

## 2023-12-19 ENCOUNTER — Other Ambulatory Visit: Payer: Self-pay | Admitting: Internal Medicine

## 2023-12-19 VITALS — BP 147/82 | HR 56 | Temp 98.3°F | Resp 18

## 2023-12-19 DIAGNOSIS — Z566 Other physical and mental strain related to work: Secondary | ICD-10-CM

## 2023-12-19 DIAGNOSIS — M1A0690 Idiopathic chronic gout, unspecified knee, without tophus (tophi): Secondary | ICD-10-CM

## 2023-12-19 NOTE — ED Triage Notes (Signed)
 Was seen yesterday.  States she was unable to go back to work today due to not sleeping well.  States needs a work note.

## 2023-12-19 NOTE — ED Provider Notes (Signed)
 [facility]    CSN: [id number] Arrival date & time: 12/19/23  1411      History   Chief Complaint Chief Complaint  Patient presents with   Follow-up    I was seen on yesterday.  My night wasn't pleasant  had nightmare about the unkind episode  I talk about yesterday.  I need a Doctor's note  for today. Tuesday March 11 Thought I would be able to go to work.  Didn't  work out. - Entered by patient    HPI Jaclyn Brooks is a 62 y.o. female.   The history is provided by the patient.   Patient presents for follow-up after she was seen on 3/10 for complaints of stress at work.  Patient states that she was planning to go to work today, but had a nightmare, which triggered her existing anxiety.  Patient also states that she is continuing to experience difficulty sleeping.  Patient denies suicidal ideations, homicidal ideations, auditory hallucinations, visual hallucinations, sadness, or racing thoughts.  Patient states that she is scheduled to see her PCP tomorrow.  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[facility])     Patient Active Problem List   Diagnosis Date Noted   Flank pain 09/19/2023   RUQ pain 09/19/2023   Chronic sinusitis 08/29/2023   Enlarged skin mole 08/29/2023   Dysphagia 07/23/2023   Reactive airway disease 07/06/2023   Loose bowel movements 07/06/2023   Primary osteoarthritis of left hip 05/05/2023   Dysuria 04/16/2023   Diarrhea of presumed infectious origin 02/01/2023   Intractable migraine with aura without status migrainosus 08/15/2022   Traumatic hematoma of right lower leg 07/13/2022   History of total abdominal hysterectomy 07/04/2022   GAD (generalized anxiety disorder) 04/28/2022   Caregiver stress 04/28/2022   Chronic left-sided low back pain without sciatica  04/13/2022   Contact dermatitis 04/13/2022   Primary osteoarthritis of right knee 03/09/2022   Refused influenza vaccine 12/02/2021   Encounter for general adult medical examination with abnormal findings 11/30/2021   Epidermal cyst 11/30/2021   Acute recurrent frontal sinusitis 09/21/2021   Allergic rhinitis 08/29/2021   Nausea 08/27/2021   Hemorrhoids 07/07/2021   Gout 03/25/2021   Type 2 diabetes mellitus with other specified complication (HCC) 07/14/2020   Primary osteoarthritis of right foot 05/12/2020   GERD (gastroesophageal reflux disease) 05/31/2019   Solitary kidney, acquired 05/10/2019   Carotid arterial disease (HCC) 12/11/2018   COPD, 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/01/2018   CKD (chronic kidney disease) stage 3, GFR 30-59 ml/min (HCC) 10/01/2018   Aortic valve sclerosis 10/01/2018   Hypertension 09/11/2018    Past Surgical History:  Procedure Laterality Date   CHOLECYSTECTOMY N/A 01/28/2015   Procedure: LAPAROSCOPIC CHOLECYSTECTOMY;  Surgeon: Franky Macho Md, MD;  Location: [facility];  Service: General;  Laterality: N/A;   excision of bone spurs Bilateral    Big toes   renal aneurysm Right    right coil-and only has function of left kidney   TOTAL ABDOMINAL HYSTERECTOMY     X-STOP IMPLANTATION      OB History     Gravida  1   Para  1   Term      Preterm  1   AB  Living         SAB      IAB      Ectopic      Multiple      Live Births               Home Medications    Prior to Admission medications   Medication Sig Start Date End Date Taking? Authorizing Provider  albuterol (VENTOLIN HFA) 108 (90 Base) MCG/ACT inhaler Inhale 1-2 puffs into the lungs every 6 (six) hours as needed for wheezing or shortness of breath. 12/22/22   Anabel Halon, MD  allopurinol (ZYLOPRIM) 100 MG tablet Take 2 tablets by mouth once daily 12/19/23   Anabel Halon, MD  amoxicillin-clavulanate (AUGMENTIN) 875-125 MG tablet Take 1 tablet by mouth 2 (two) times daily. 11/29/23    Anabel Halon, MD  atenolol (TENORMIN) 50 MG tablet Take 1 tablet (50 mg total) by mouth daily. 08/29/23   Anabel Halon, MD  benzonatate (TESSALON) 100 MG capsule Take 1 capsule (100 mg total) by mouth 2 (two) times daily as needed for cough. 11/01/23   Anabel Halon, MD  budesonide-formoterol (SYMBICORT) 80-4.5 MCG/ACT inhaler Inhale 2 puffs into the lungs 2 (two) times daily. 07/06/23   Anabel Halon, MD  butalbital-acetaminophen-caffeine (FIORICET) [phone] MG tablet Take 1 tablet by mouth every 12 (twelve) hours as needed for headache or migraine. 08/15/22   Patel, Earlie Lou, MD  CARTIA XT 240 MG 24 hr capsule Take 1 capsule (240 mg total) by mouth daily. 08/29/23   Anabel Halon, MD  cetirizine (ZYRTEC) 10 MG tablet Take 1 tablet (10 mg total) by mouth daily. 11/24/23   Leath-Warren, Sadie Haber, NP  Chlorphen-PE-Acetaminophen (NOREL AD) 4-10-325 MG TABS Take 1 tablet by mouth 3 (three) times daily as needed. 11/29/23   Anabel Halon, MD  clopidogrel (PLAVIX) 75 MG tablet Take 1 tablet (75 mg total) by mouth every evening. 08/29/23   Anabel Halon, MD  dexlansoprazole (DEXILANT) 60 MG capsule Take 1 capsule (60 mg total) by mouth daily. 08/29/23   Anabel Halon, MD  dicyclomine (BENTYL) 10 MG capsule Take 1 capsule (10 mg total) by mouth 3 (three) times daily as needed for spasms. 07/06/23   Anabel Halon, MD  guaiFENesin-codeine 100-10 MG/5ML syrup Take 5 mLs by mouth 3 (three) times daily as needed for cough. 11/29/23   Anabel Halon, MD  hydrochlorothiazide (HYDRODIURIL) 25 MG tablet Take 1 tablet (25 mg total) by mouth daily. 08/29/23   Anabel Halon, MD  hydrOXYzine (ATARAX) 10 MG tablet Take 1 tablet (10 mg total) by mouth every 8 (eight) hours as needed for anxiety. 12/18/23   Valentino Nose, NP  methocarbamol (ROBAXIN) 500 MG tablet Take 1 tablet (500 mg total) by mouth every 8 (eight) hours as needed for muscle spasms. 09/22/23   Magnant, Charles L, PA-C  montelukast  (SINGULAIR) 10 MG tablet Take 1 tablet (10 mg total) by mouth at bedtime. 07/06/23   Anabel Halon, MD  ondansetron (ZOFRAN-ODT) 4 MG disintegrating tablet Take 1 tablet (4 mg total) by mouth every 8 (eight) hours as needed. 06/05/23   Leath-Warren, Sadie Haber, NP  atorvastatin (LIPITOR) 20 MG tablet Take 1 tablet (20 mg total) by mouth at bedtime. 11/25/19 02/18/20  Salley Scarlet, MD    Family History Family History  Problem Relation Age of Onset   Hypertension Mother    Heart disease Mother  Cardiac Arrest    Hypertension Father    Cancer Father    Hypertension Sister    Cancer Brother        Bladder Cancer    Hypertension Brother    Hypertension Sister     Social History Social History   Tobacco Use   Smoking status: Former    Current packs/day: 0.00    Average packs/day: 0.3 packs/day for 20.0 years (5.0 ttl pk-yrs)    Types: Cigarettes    Start date: 1999    Quit date: 2019    Years since quitting: 6.1   Smokeless tobacco: Never  Vaping Use   Vaping status: Never Used  Substance Use Topics   Alcohol use: Not Currently    Comment: occasionally   Drug use: No     Allergies   Bee venom, Nsaids, and Shellfish allergy   Review of Systems Review of Systems Per HPI  Physical Exam Triage Vital Signs ED Triage Vitals [12/19/23 1445]  Encounter Vitals Group     BP (!) 147/82     Systolic BP Percentile      Diastolic BP Percentile      Pulse Rate (!) 56     Resp 18     Temp 98.3 F (36.8 C)     Temp Source Oral     SpO2 98 %     Weight      Height      Head Circumference      Peak Flow      Pain Score 0     Pain Loc      Pain Education      Exclude from Growth Chart    No data found.  Updated Vital Signs BP (!) 147/82 (BP Location: Right Arm)   Pulse (!) 56   Temp 98.3 F (36.8 C) (Oral)   Resp 18   SpO2 98%   Visual Acuity Right Eye Distance:   Left Eye Distance:   Bilateral Distance:    Right Eye Near:   Left Eye Near:     Bilateral Near:     Physical Exam Vitals and nursing note reviewed.  Constitutional:      General: She is not in acute distress.    Appearance: Normal appearance.  Eyes:     Extraocular Movements: Extraocular movements intact.     Pupils: Pupils are equal, round, and reactive to light.  Pulmonary:     Effort: Pulmonary effort is normal.  Skin:    General: Skin is warm and dry.  Neurological:     General: No focal deficit present.     Mental Status: She is alert and oriented to person, place, and time.  Psychiatric:        Attention and Perception: Attention and perception normal.        Mood and Affect: Mood normal.        Speech: Speech normal.        Behavior: Behavior normal.        Thought Content: Thought content normal.        Cognition and Memory: Cognition and memory normal.        Judgment: Judgment normal.      UC Treatments / Results  Labs (all labs ordered are listed, but only abnormal results are displayed) Labs Reviewed - No data to display  EKG   Radiology No results found.  Procedures Procedures (including critical care time)  Medications Ordered in UC Medications - No data  to display  Initial Impression / Assessment and Plan / UC Course  I have reviewed the triage vital signs and the nursing notes.  Pertinent labs & imaging results that were available during my care of the patient were reviewed by me and considered in my medical decision making (see chart for details).  Patient given work note for today.  Patient previously prescribed hydroxyzine, patient advised to take 2 tablets of hydroxyzine at bedtime if she continues to experience difficulty sleeping.  Supportive care recommendations were provided and discussed with the patient to include finding something that she enjoys doing, hanging out with friends, and considering regular exercise.  Patient is scheduled to see her PCP tomorrow.  Patient was given strict ER follow-up precautions.   Patient was in agreement with this plan of care and verbalizes understanding.  All questions were answered.  Patient stable for discharge.  Final Clinical Impressions(s) / UC Diagnoses   Final diagnoses:  Stress at work     Discharge Instructions      May increase Hydroxyzine to 2 tablets at bedtime if you continue to have difficulty sleeping. Make sure you are able to get a full 8 hours of sleep. Do something you enjoy such as reading, exercising or hanging out with friends. Consider regular exercise to help with anxiety symptoms. Go to the emergency department if you experience worsening anxiety symptoms, or feel like you want to hurt yourself or someone else. Follow-up with PCP as scheduled. Follow-up as needed.      ED Prescriptions   None    PDMP not reviewed this encounter.   Abran Cantor, NP 12/19/23 1521

## 2023-12-19 NOTE — Discharge Instructions (Signed)
 May increase Hydroxyzine to 2 tablets at bedtime if you continue to have difficulty sleeping. Make sure you are able to get a full 8 hours of sleep. Do something you enjoy such as reading, exercising or hanging out with friends. Consider regular exercise to help with anxiety symptoms. Go to the emergency department if you experience worsening anxiety symptoms, or feel like you want to hurt yourself or someone else. Follow-up with PCP as scheduled. Follow-up as needed.

## 2023-12-20 ENCOUNTER — Ambulatory Visit: Payer: Medicare Other | Admitting: Surgical

## 2023-12-20 ENCOUNTER — Encounter: Payer: Self-pay | Admitting: Surgical

## 2023-12-20 ENCOUNTER — Other Ambulatory Visit: Payer: Self-pay

## 2023-12-20 DIAGNOSIS — M1612 Unilateral primary osteoarthritis, left hip: Secondary | ICD-10-CM

## 2023-12-20 NOTE — Progress Notes (Signed)
 Follow-up Office Visit Note   Patient: Jaclyn Brooks           Date of Birth: [birth year]           MRN: [id number] Visit Date: 12/20/2023 Requested by: Anabel Halon, MD [address] [place],  [place] [address] PCP: Anabel Halon, MD  Subjective: Chief Complaint  Patient presents with  . Left Hip - Pain    HPI: Jaclyn Brooks is a 63 y.o. female who returns to the office for follow-up visit.    Plan at last visit was: Patient is a 63 year old female with history of left hip arthritis. Presents today for 1 day of symptoms with similar symptoms to previous visit on 7/17 with cramping and burning sensation in the anterior thigh. She had previous left hip injection at that time. We discussed options available to patient and she would like to hold off on any intervention for now which is reasonable with only 1 day of symptoms. We will try Robaxin to take as needed to see if this will help with the cramping sensation but recommended she return if symptoms persist.   Since then, patient notes continued groin pain with radiation into the thigh.  Not really any radiation past the knee significantly.  No mechanical symptoms.  Symptoms are worse with walking which she does a lot at her job.              ROS: All systems reviewed are negative as they relate to the chief complaint within the history of present illness.  Patient denies fevers or chills.  Assessment & Plan: Visit Diagnoses:  1. Arthritis of left hip     Plan: Jaclyn Brooks is a 63 y.o. female who returns to the office for follow-up visit.  Plan from last visit was noted above in HPI.  They now return with continued left hip pain.  Muscle relaxer prescribed at last visit has not alleviated her pain.  She would like to have a repeat left hip injection which she previously had on 04/26/2023.  This was administered today under ultrasound guidance and patient tolerated procedure well without complication.  Follow-up with the office as  needed if pain does not improve.  Follow-Up Instructions: No follow-ups on file.   Orders:  Orders Placed This Encounter  Procedures  . US Guided Needle Placement - No Linked Charges   No orders of the defined types were placed in this encounter.     Procedures: No procedures performed   Clinical Data: No additional findings.  Objective: Vital Signs: There were no vitals taken for this visit.  Physical Exam:  Constitutional: Patient appears well-developed HEENT:  Head: Normocephalic Eyes:EOM are normal Neck: Normal range of motion Cardiovascular: Normal rate Pulmonary/chest: Effort normal Neurologic: Patient is alert Skin: Skin is warm Psychiatric: Patient has normal mood and affect  Ortho Exam: Ortho exam demonstrates left hip with positive FADIR sign.  Positive Stinchfield sign.  She has decreased internal rotation passively of the left hip.  No tenderness over the greater trochanter.  Specialty Comments:  No specialty comments available.  Imaging: No results found.   PMFS History: Patient Active Problem List   Diagnosis Date Noted  . Flank pain 09/19/2023  . RUQ pain 09/19/2023  . Chronic sinusitis 08/29/2023  . Enlarged skin mole 08/29/2023  . Dysphagia 07/23/2023  . Reactive airway disease 07/06/2023  . Loose bowel movements 07/06/2023  . Primary osteoarthritis of left hip 05/05/2023  .  Dysuria 04/16/2023  . Diarrhea of presumed infectious origin 02/01/2023  . Intractable migraine with aura without status migrainosus 08/15/2022  . Traumatic hematoma of right lower leg 07/13/2022  . History of total abdominal hysterectomy 07/04/2022  . GAD (generalized anxiety disorder) 04/28/2022  . Caregiver stress 04/28/2022  . Chronic left-sided low back pain without sciatica 04/13/2022  . Contact dermatitis 04/13/2022  . Primary osteoarthritis of right knee 03/09/2022  . Refused influenza vaccine 12/02/2021  . Encounter for general adult medical examination  with abnormal findings 11/30/2021  . Epidermal cyst 11/30/2021  . Acute recurrent frontal sinusitis 09/21/2021  . Allergic rhinitis 08/29/2021  . Nausea 08/27/2021  . Hemorrhoids 07/07/2021  . Gout 03/25/2021  . Type 2 diabetes mellitus with other specified complication (HCC) 07/14/2020  . 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 GERD (gastroesophageal reflux disease) 05/31/2019  . Solitary kidney, acquired 05/10/2019  . Carotid arterial disease (HCC) 12/11/2018  . COPD, 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/01/2018  . CKD (chronic kidney disease) stage 3, GFR 30-59 ml/min (HCC) 10/01/2018  . Aortic valve sclerosis 10/01/2018  . Hypertension 09/11/2018   Past Medical History:  Diagnosis Date  . Allergy   . Asthma    Phreesia 11/25/2020  . Chronic kidney disease    Phreesia 11/25/2020  . Depression   . Gout   . Hyperlipidemia   . Hypertension   . 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 Hypertension Mother   . Heart disease Mother        Cardiac Arrest   . Hypertension Father   . Cancer Father   . Hypertension Sister   . Cancer Brother        Bladder Cancer   . Hypertension Brother   . Hypertension Sister     Past Surgical History:  Procedure Laterality Date  . CHOLECYSTECTOMY N/A 01/28/2015   Procedure: LAPAROSCOPIC CHOLECYSTECTOMY;  Surgeon: Franky Macho Md, MD;  Location: [facility];  Service: General;  Laterality: N/A;  . excision of bone spurs Bilateral    Big toes  . renal aneurysm Right    right coil-and only has function of left kidney  . TOTAL ABDOMINAL HYSTERECTOMY    . X-STOP IMPLANTATION     Social History   Occupational History  . Occupation: Retired  Tobacco Use  . Smoking status: Former    Current packs/day: 0.00    Average packs/day: 0.3 packs/day for 20.0 years (5.0 ttl pk-yrs)    Types: Cigarettes    Start date: 44    Quit date: 2019    Years since quitting: 6.1  . Smokeless tobacco: Never  Vaping Use  .  Vaping status: Never Used  Substance and Sexual Activity  . Alcohol use: Not Currently    Comment: occasionally  . Drug use: No  . Sexual activity: Yes    Birth control/protection: Surgical

## 2023-12-21 ENCOUNTER — Other Ambulatory Visit: Payer: Self-pay | Admitting: Internal Medicine

## 2023-12-21 ENCOUNTER — Ambulatory Visit (INDEPENDENT_AMBULATORY_CARE_PROVIDER_SITE_OTHER): Admitting: Family Medicine

## 2023-12-21 ENCOUNTER — Encounter: Payer: Self-pay | Admitting: Family Medicine

## 2023-12-21 VITALS — BP 138/84 | HR 53 | Ht 66.0 in | Wt 184.1 lb

## 2023-12-21 DIAGNOSIS — J4530 Mild persistent asthma, uncomplicated: Secondary | ICD-10-CM

## 2023-12-21 DIAGNOSIS — F419 Anxiety disorder, unspecified: Secondary | ICD-10-CM

## 2023-12-21 DIAGNOSIS — J209 Acute bronchitis, unspecified: Secondary | ICD-10-CM | POA: Diagnosis not present

## 2023-12-21 DIAGNOSIS — K219 Gastro-esophageal reflux disease without esophagitis: Secondary | ICD-10-CM

## 2023-12-21 MED ORDER — ALBUTEROL SULFATE HFA 108 (90 BASE) MCG/ACT IN AERS: 1.0000 | INHALATION_SPRAY | Freq: Four times a day (QID) | RESPIRATORY_TRACT | 2 refills | Status: DC | PRN

## 2023-12-21 MED ORDER — BUDESONIDE-FORMOTEROL FUMARATE 80-4.5 MCG/ACT IN AERO: 2.0000 | INHALATION_SPRAY | Freq: Two times a day (BID) | RESPIRATORY_TRACT | 2 refills | Status: DC

## 2023-12-21 NOTE — Progress Notes (Signed)
 Acute Office Visit  Subjective:    Patient ID: Jaclyn Brooks, female    DOB: [birth year], 63 y.o.   MRN: [id number]  Chief Complaint  Patient presents with   Anxiety    Pt reports sx of anxiety affecting her day to day life, has had work stressors as well.    Medication Refill    Needs refills on inhalers.     HPI Patient is in today with complains of work related anxiety. For the details of today's visit, please refer to the assessment and plan.      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[facility])     Past Surgical History:  Procedure Laterality Date   CHOLECYSTECTOMY N/A 01/28/2015   Procedure: LAPAROSCOPIC CHOLECYSTECTOMY;  Surgeon: Franky Macho Md, MD;  Location: [facility];  Service: General;  Laterality: N/A;   excision of bone spurs Bilateral    Big toes   renal aneurysm Right    right coil-and only has function of left kidney   TOTAL ABDOMINAL HYSTERECTOMY     X-STOP IMPLANTATION      Family History  Problem Relation Age of Onset   Hypertension Mother    Heart disease Mother        Cardiac Arrest    Hypertension Father    Cancer Father    Hypertension Sister    Cancer Brother        Bladder Cancer    Hypertension Brother    Hypertension Sister     Social History   Socioeconomic History   Marital status: Unknown    Spouse name: Not on file   Number of children: 1   Years of education: 12   Highest education level: Some college, no degree  Occupational History   Occupation: Retired  Tobacco Use   Smoking status: Former    Current packs/day: 0.00    Average packs/day: 0.3 packs/day for 20.0 years (5.0 ttl pk-yrs)    Types: Cigarettes    Start date: 1999    Quit date: 2019    Years since quitting: 6.2   Smokeless tobacco: Never  Vaping Use   Vaping status: Never Used   Substance and Sexual Activity   Alcohol use: Not Currently    Comment: occasionally   Drug use: No   Sexual activity: Yes    Birth control/protection: Surgical  Other Topics Concern   Not on file  Social History Narrative   Not on file   Social Drivers of Health   Financial Resource Strain: Low Risk  (10/19/2021)   Overall Financial Resource Strain (CARDIA)    Difficulty of Paying Living Expenses: Not hard at all  Food Insecurity: No Food Insecurity (10/19/2021)   Hunger Vital Sign    Worried About Running Out of Food in the Last Year: Never true    Ran Out of Food in the Last Year: Never true  Transportation Needs: No Transportation Needs (10/19/2021)   PRAPARE - Administrator, Civil Service (Medical): No    Lack of Transportation (Non-Medical): No  Physical Activity: Sufficiently Active (10/19/2021)   Exercise Vital Sign    Days of Exercise per Week: 5 days    Minutes of Exercise per Session: 30 min  Stress:  No Stress Concern Present (10/19/2021)   [employer] of Occupational Health - Occupational Stress Questionnaire    Feeling of Stress : Not at all  Social Connections: Moderately Isolated (10/19/2021)   Social Connection and Isolation Panel [NHANES]    Frequency of Communication with Friends and Family: More than three times a week    Frequency of Social Gatherings with Friends and Family: More than three times a week    Attends Religious Services: More than 4 times per year    Active Member of [employer] or Organizations: No    Attends Banker Meetings: Never    Marital Status: Never married  Intimate Partner Violence: Not At Risk (10/19/2021)   Humiliation, Afraid, Rape, and Kick questionnaire    Fear of Current or Ex-Partner: No    Emotionally Abused: No    Physically Abused: No    Sexually Abused: No    Outpatient Medications Prior to Visit  Medication Sig Dispense Refill   allopurinol (ZYLOPRIM) 100 MG tablet Take 2 tablets by mouth once  daily 60 tablet 11   amoxicillin-clavulanate (AUGMENTIN) 875-125 MG tablet Take 1 tablet by mouth 2 (two) times daily. 14 tablet 0   atenolol (TENORMIN) 50 MG tablet Take 1 tablet (50 mg total) by mouth daily. 90 tablet 1   benzonatate (TESSALON) 100 MG capsule Take 1 capsule (100 mg total) by mouth 2 (two) times daily as needed for cough. 30 capsule 0   butalbital-acetaminophen-caffeine (FIORICET) 50-325-40 MG tablet Take 1 tablet by mouth every 12 (twelve) hours as needed for headache or migraine. 20 tablet 0   CARTIA XT 240 MG 24 hr capsule Take 1 capsule (240 mg total) by mouth daily. 90 capsule 3   cetirizine (ZYRTEC) 10 MG tablet Take 1 tablet (10 mg total) by mouth daily. 30 tablet 0   Chlorphen-PE-Acetaminophen (NOREL AD) 4-10-325 MG TABS Take 1 tablet by mouth 3 (three) times daily as needed. 30 tablet 0   clopidogrel (PLAVIX) 75 MG tablet Take 1 tablet (75 mg total) by mouth every evening. 90 tablet 3   dexlansoprazole (DEXILANT) 60 MG capsule Take 1 capsule by mouth once daily 30 capsule 0   dicyclomine (BENTYL) 10 MG capsule Take 1 capsule (10 mg total) by mouth 3 (three) times daily as needed for spasms. 30 capsule 1   guaiFENesin-codeine 100-10 MG/5ML syrup Take 5 mLs by mouth 3 (three) times daily as needed for cough. 120 mL 0   hydrochlorothiazide (HYDRODIURIL) 25 MG tablet Take 1 tablet (25 mg total) by mouth daily. 90 tablet 1   hydrOXYzine (ATARAX) 10 MG tablet Take 1 tablet (10 mg total) by mouth every 8 (eight) hours as needed for anxiety. 20 tablet 0   methocarbamol (ROBAXIN) 500 MG tablet Take 1 tablet (500 mg total) by mouth every 8 (eight) hours as needed for muscle spasms. 30 tablet 1   montelukast (SINGULAIR) 10 MG tablet Take 1 tablet (10 mg total) by mouth at bedtime. 30 tablet 3   ondansetron (ZOFRAN-ODT) 4 MG disintegrating tablet Take 1 tablet (4 mg total) by mouth every 8 (eight) hours as needed. 20 tablet 0   albuterol (VENTOLIN HFA) 108 (90 Base) MCG/ACT inhaler  Inhale 1-2 puffs into the lungs every 6 (six) hours as needed for wheezing or shortness of breath. 1 each 2   budesonide-formoterol (SYMBICORT) 80-4.5 MCG/ACT inhaler Inhale 2 puffs into the lungs 2 (two) times daily. 1 each 2   No facility-administered medications prior to visit.  Allergies  Allergen Reactions   Bee Venom Hives   Nsaids     Kidney disease    Shellfish Allergy Itching    Review of Systems     Objective:    Physical Exam  BP 138/84 (BP Location: Left Arm)   Pulse (!) 53   Ht 5\' 6"  (1.676 m)   Wt 184 lb 1.3 oz (83.5 kg)   SpO2 97%   BMI 29.71 kg/m  Wt Readings from Last 3 Encounters:  12/21/23 184 lb 1.3 oz (83.5 kg)  11/01/23 187 lb (84.8 kg)  09/19/23 181 lb 0.6 oz (82.1 kg)       Assessment & Plan:  Anxiety Assessment & Plan: The patient is in today with complaints of work-related anxiety. She was seen in the ED on December 18, 2023, and December 19, 2023, for similar complaints and was started on hydroxyzine 10 mg every eight hours as needed. She reports starting therapy two days ago and experiencing symptomatic relief. She is eating and sleeping well and denies suicidal ideation (SI), homicidal ideation (HI), and auditory or visual hallucinations (AVH). Encouraged to continue the treatment regimen. She may increase to 20 mg if her symptoms worsen. May consider a daily regimen if her anxiety is not relieved with PRN therapy (Lexapro 5 mg daily). She is aware of the plan of care and has verbalized understanding    12/21/2023   10:48 AM 08/29/2023   10:51 AM 07/06/2023    1:37 PM 05/05/2023    8:09 AM  GAD 7 : Generalized Anxiety Score  Nervous, Anxious, on Edge 2 0 1 1  Control/stop worrying 3 1 2 2   Worry too much - different things 3 1 2 2   Trouble relaxing 0 1 2 2   Restless 0 0 1 1  Easily annoyed or irritable 3 0 1 1  Afraid - awful might happen 0 1 0 0  Total GAD 7 Score 11 4 9 9   Anxiety Difficulty Somewhat difficult Somewhat difficult  Somewhat difficult Somewhat difficult       Acute bronchitis, unspecified organism -     Albuterol Sulfate HFA; Inhale 1-2 puffs into the lungs every 6 (six) hours as needed for wheezing or shortness of breath.  Dispense: 1 each; Refill: 2  Mild persistent reactive airway disease without complication -     Budesonide-Formoterol Fumarate; Inhale 2 puffs into the lungs 2 (two) times daily.  Dispense: 1 each; Refill: 2  Note: This chart has been completed using Engineer, civil (consulting) software, and while attempts have been made to ensure accuracy, certain words and phrases may not be transcribed as intended.    Gilmore Laroche, FNP

## 2023-12-21 NOTE — Assessment & Plan Note (Addendum)
 The patient is in today with complaints of work-related anxiety. She was seen in the ED on December 18, 2023, and December 19, 2023, for similar complaints and was started on hydroxyzine 10 mg every eight hours as needed. She reports starting therapy two days ago and experiencing symptomatic relief. She is eating and sleeping well and denies suicidal ideation (SI), homicidal ideation (HI), and auditory or visual hallucinations (AVH). Encouraged to continue the treatment regimen. She may increase to 20 mg if her symptoms worsen. May consider a daily regimen if her anxiety is not relieved with PRN therapy (Lexapro 5 mg daily). She is aware of the plan of care and has verbalized understanding    12/21/2023   10:48 AM 08/29/2023   10:51 AM 07/06/2023    1:37 PM 05/05/2023    8:09 AM  GAD 7 : Generalized Anxiety Score  Nervous, Anxious, on Edge 2 0 1 1  Control/stop worrying 3 1 2 2   Worry too much - different things 3 1 2 2   Trouble relaxing 0 1 2 2   Restless 0 0 1 1  Easily annoyed or irritable 3 0 1 1  Afraid - awful might happen 0 1 0 0  Total GAD 7 Score 11 4 9 9   Anxiety Difficulty Somewhat difficult Somewhat difficult Somewhat difficult Somewhat difficult

## 2023-12-21 NOTE — Patient Instructions (Addendum)
 I appreciate the opportunity to provide care to you today!   Anxiety Continue taking hydroxyzine 10 mg every eight hours as needed for anxiety. If your anxiety does not improve with hydroxyzine 10 mg, you may take two tablets (a total of 20 mg) every eight hours as needed for anxiety. Please follow up if your anxiety worsens or fails to improve with the current treatment regimen. If necessary, we will consider starting a daily medication, such as Lexapro.  Nonpharmacologic management of anxiety   Mindfulness and Meditation Practices like mindfulness meditation can help reduce symptoms by promoting relaxation and present-moment awareness.  Exercise  Regular physical activity has been shown to improve mood and reduce anxiety through the release of endorphins and other neurochemicals.  Healthy Diet Eating a balanced diet rich in fruits, vegetables, whole grains, and lean proteins can support overall mental health.  Sleep Hygiene  Establishing a regular sleep routine and ensuring good sleep quality can significantly impact mood and anxiety levels.  Stress Management Techniques Activities such as yoga, tai chi, and deep breathing exercises can help manage stress.  Social Support Maintaining strong relationships and seeking support from friends, family, or support groups can provide emotional comfort and reduce feelings of isolation.  Lifestyle Modifications Reducing alcohol and caffeine intake, quitting smoking, and avoiding recreational drugs can improve symptoms.  Art and Music Therapy Engaging in creative activities like painting, drawing, or playing music can be therapeutic and help express emotions.  Light Therapy Particularly useful for seasonal affective disorder (SAD), exposure to bright light can help regulate mood. .      Please continue to a heart-healthy diet and increase your physical activities. Try to exercise for at least five days a week.    It was a pleasure to  see you and I look forward to continuing to work together on your health and well-being. Please do not hesitate to call the office if you need care or have questions about your care.  In case of emergency, please visit the Emergency Department for urgent care, or contact our clinic at [phone] to schedule an appointment. We're here to help you!   Have a wonderful day and week. With Gratitude, Gilmore Laroche MSN, FNP-BC

## 2023-12-26 ENCOUNTER — Ambulatory Visit
Admission: RE | Admit: 2023-12-26 | Discharge: 2023-12-26 | Disposition: A | Discharge status: Home / Self Care | Source: Ambulatory Visit | Attending: Family Medicine | Admitting: Family Medicine

## 2023-12-26 VITALS — BP 151/59 | HR 51 | Temp 98.0°F | Resp 16

## 2023-12-26 DIAGNOSIS — R42 Dizziness and giddiness: Secondary | ICD-10-CM

## 2023-12-26 DIAGNOSIS — F419 Anxiety disorder, unspecified: Secondary | ICD-10-CM | POA: Diagnosis not present

## 2023-12-26 LAB — POCT URINALYSIS DIP (MANUAL ENTRY)
Bilirubin, UA: NEGATIVE
Blood, UA: NEGATIVE
Glucose, UA: NEGATIVE mg/dL
Ketones, POC UA: NEGATIVE mg/dL
Leukocytes, UA: NEGATIVE
Nitrite, UA: NEGATIVE
Protein Ur, POC: >=300 mg/dL — AB
Spec Grav, UA: >=1.03 — AB (ref 1.010–1.025)
Urobilinogen, UA: 0.2 U/dL
pH, UA: 5 (ref 5.0–8.0)

## 2023-12-26 NOTE — ED Provider Notes (Signed)
 [facility]    CSN: [id number] Arrival date & time: 12/26/23  0848      History   Chief Complaint Chief Complaint  Patient presents with   Anxiety    Entered by patient    HPI Jaclyn Brooks is a 63 y.o. female.   Patient presenting today complaining of mild headache, occasional dizziness since several days after starting the hydroxyzine that was given as needed for anxiety.  She is unsure if there is a correlation but it seemed to start shortly after.  Denies headache of life, visual change, significant mental status change, chest pain, shortness of breath, palpitations, weakness or numbness of extremities.  So far not trying to thing over-the-counter for symptoms.  Think she may be a bit dehydrated.  Past medical history significant for hypertension, valvular disease, vascular disease, migraines, COPD, reactive airway disease, diabetes, CKD with solitary kidney, anxiety.    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[facility])     Patient Active Problem List   Diagnosis Date Noted   Anxiety 12/21/2023   Flank pain 09/19/2023   RUQ pain 09/19/2023   Chronic sinusitis 08/29/2023   Enlarged skin mole 08/29/2023   Dysphagia 07/23/2023   Reactive airway disease 07/06/2023   Loose bowel movements 07/06/2023   Primary osteoarthritis of left hip 05/05/2023   Dysuria 04/16/2023   Diarrhea of presumed infectious origin 02/01/2023   Intractable migraine with aura without status migrainosus 08/15/2022   Traumatic hematoma of right lower leg 07/13/2022   History of total abdominal hysterectomy 07/04/2022   GAD (generalized anxiety disorder) 04/28/2022   Caregiver stress 04/28/2022   Chronic left-sided low back pain without sciatica 04/13/2022   Contact dermatitis 04/13/2022   Primary osteoarthritis  of right knee 03/09/2022   Refused influenza vaccine 12/02/2021   Encounter for general adult medical examination with abnormal findings 11/30/2021   Epidermal cyst 11/30/2021   Acute recurrent frontal sinusitis 09/21/2021   Allergic rhinitis 08/29/2021   Nausea 08/27/2021   Hemorrhoids 07/07/2021   Gout 03/25/2021   Type 2 diabetes mellitus with other specified complication (HCC) 07/14/2020   Primary osteoarthritis of right foot 05/12/2020   GERD (gastroesophageal reflux disease) 05/31/2019   Solitary kidney, acquired 05/10/2019   Carotid arterial disease (HCC) 12/11/2018   COPD, 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/01/2018   CKD (chronic kidney disease) stage 3, GFR 30-59 ml/min (HCC) 10/01/2018   Aortic valve sclerosis 10/01/2018   Hypertension 09/11/2018    Past Surgical History:  Procedure Laterality Date   CHOLECYSTECTOMY N/A 01/28/2015   Procedure: LAPAROSCOPIC CHOLECYSTECTOMY;  Surgeon: Franky Macho Md, MD;  Location: [facility];  Service: General;  Laterality: N/A;   excision of bone spurs Bilateral    Big toes   renal aneurysm Right    right coil-and only has function of left kidney   TOTAL ABDOMINAL HYSTERECTOMY     X-STOP IMPLANTATION      OB History     Gravida  1   Para  1   Term      Preterm  1   AB      Living         SAB      IAB      Ectopic  Multiple      Live Births               Home Medications    Prior to Admission medications   Medication Sig Start Date End Date Taking? Authorizing Provider  albuterol (VENTOLIN HFA) 108 (90 Base) MCG/ACT inhaler Inhale 1-2 puffs into the lungs every 6 (six) hours as needed for wheezing or shortness of breath. 12/21/23   Gilmore Laroche, FNP  allopurinol (ZYLOPRIM) 100 MG tablet Take 2 tablets by mouth once daily 12/19/23   Anabel Halon, MD  amoxicillin-clavulanate (AUGMENTIN) 875-125 MG tablet Take 1 tablet by mouth 2 (two) times daily. 11/29/23   Anabel Halon, MD  atenolol (TENORMIN) 50 MG tablet Take 1  tablet (50 mg total) by mouth daily. 08/29/23   Anabel Halon, MD  benzonatate (TESSALON) 100 MG capsule Take 1 capsule (100 mg total) by mouth 2 (two) times daily as needed for cough. 11/01/23   Anabel Halon, MD  budesonide-formoterol (SYMBICORT) 80-4.5 MCG/ACT inhaler Inhale 2 puffs into the lungs 2 (two) times daily. 12/21/23   Gilmore Laroche, FNP  butalbital-acetaminophen-caffeine (FIORICET) [phone] MG tablet Take 1 tablet by mouth every 12 (twelve) hours as needed for headache or migraine. 08/15/22   Patel, Earlie Lou, MD  CARTIA XT 240 MG 24 hr capsule Take 1 capsule (240 mg total) by mouth daily. 08/29/23   Anabel Halon, MD  cetirizine (ZYRTEC) 10 MG tablet Take 1 tablet (10 mg total) by mouth daily. 11/24/23   Leath-Warren, Sadie Haber, NP  Chlorphen-PE-Acetaminophen (NOREL AD) 4-10-325 MG TABS Take 1 tablet by mouth 3 (three) times daily as needed. 11/29/23   Anabel Halon, MD  clopidogrel (PLAVIX) 75 MG tablet Take 1 tablet (75 mg total) by mouth every evening. 08/29/23   Anabel Halon, MD  dexlansoprazole (DEXILANT) 60 MG capsule Take 1 capsule by mouth once daily 12/21/23   Anabel Halon, MD  dicyclomine (BENTYL) 10 MG capsule Take 1 capsule (10 mg total) by mouth 3 (three) times daily as needed for spasms. 07/06/23   Anabel Halon, MD  guaiFENesin-codeine 100-10 MG/5ML syrup Take 5 mLs by mouth 3 (three) times daily as needed for cough. 11/29/23   Anabel Halon, MD  hydrochlorothiazide (HYDRODIURIL) 25 MG tablet Take 1 tablet (25 mg total) by mouth daily. 08/29/23   Anabel Halon, MD  hydrOXYzine (ATARAX) 10 MG tablet Take 1 tablet (10 mg total) by mouth every 8 (eight) hours as needed for anxiety. 12/18/23   Valentino Nose, NP  methocarbamol (ROBAXIN) 500 MG tablet Take 1 tablet (500 mg total) by mouth every 8 (eight) hours as needed for muscle spasms. 09/22/23   Magnant, Charles L, PA-C  montelukast (SINGULAIR) 10 MG tablet Take 1 tablet (10 mg total) by mouth at bedtime.  07/06/23   Anabel Halon, MD  ondansetron (ZOFRAN-ODT) 4 MG disintegrating tablet Take 1 tablet (4 mg total) by mouth every 8 (eight) hours as needed. 06/05/23   Leath-Warren, Sadie Haber, NP  atorvastatin (LIPITOR) 20 MG tablet Take 1 tablet (20 mg total) by mouth at bedtime. 11/25/19 02/18/20  Salley Scarlet, MD    Family History Family History  Problem Relation Age of Onset   Hypertension Mother    Heart disease Mother        Cardiac Arrest    Hypertension Father    Cancer Father    Hypertension Sister    Cancer Brother  Bladder Cancer    Hypertension Brother    Hypertension Sister     Social History Social History   Tobacco Use   Smoking status: Former    Current packs/day: 0.00    Average packs/day: 0.3 packs/day for 20.0 years (5.0 ttl pk-yrs)    Types: Cigarettes    Start date: 1999    Quit date: 2019    Years since quitting: 6.2   Smokeless tobacco: Never  Vaping Use   Vaping status: Never Used  Substance Use Topics   Alcohol use: Not Currently    Comment: occasionally   Drug use: No     Allergies   Bee venom, Nsaids, and Shellfish allergy   Review of Systems Review of Systems Per HPI  Physical Exam Triage Vital Signs ED Triage Vitals  Encounter Vitals Group     BP 12/26/23 0917 (!) 151/59     Systolic BP Percentile --      Diastolic BP Percentile --      Pulse Rate 12/26/23 0917 (!) 51     Resp 12/26/23 0917 16     Temp 12/26/23 0908 98 F (36.7 C)     Temp Source 12/26/23 0908 Oral     SpO2 12/26/23 0917 97 %     Weight --      Height --      Head Circumference --      Peak Flow --      Pain Score 12/26/23 0913 7     Pain Loc --      Pain Education --      Exclude from Growth Chart --    No data found.  Updated Vital Signs BP (!) 151/59 (BP Location: Right Arm)   Pulse (!) 51   Temp 98 F (36.7 C) (Oral)   Resp 16   SpO2 97%   Visual Acuity Right Eye Distance:   Left Eye Distance:   Bilateral Distance:    Right Eye  Near:   Left Eye Near:    Bilateral Near:     Physical Exam Vitals and nursing note reviewed.  Constitutional:      Appearance: Normal appearance. She is not ill-appearing.  HENT:     Head: Atraumatic.     Nose: Nose normal.     Mouth/Throat:     Mouth: Mucous membranes are moist.  Eyes:     Extraocular Movements: Extraocular movements intact.     Conjunctiva/sclera: Conjunctivae normal.  Cardiovascular:     Rate and Rhythm: Normal rate and regular rhythm.     Heart sounds: Normal heart sounds.  Pulmonary:     Effort: Pulmonary effort is normal.     Breath sounds: Normal breath sounds.  Musculoskeletal:        General: Normal range of motion.     Cervical back: Normal range of motion and neck supple.  Skin:    General: Skin is warm and dry.  Neurological:     General: No focal deficit present.     Mental Status: She is alert and oriented to person, place, and time.     Cranial Nerves: No cranial nerve deficit.     Motor: No weakness.     Gait: Gait normal.  Psychiatric:        Mood and Affect: Mood normal.        Thought Content: Thought content normal.        Judgment: Judgment normal.      UC Treatments / Results  Labs (all labs ordered are listed, but only abnormal results are displayed) Labs Reviewed  POCT URINALYSIS DIP (MANUAL ENTRY) - Abnormal; Notable for the following components:      Result Value   Spec Grav, UA >=1.030 (*)    Protein Ur, POC >=300 (*)    All other components within normal limits    EKG   Radiology No results found.  Procedures Procedures (including critical care time)  Medications Ordered in UC Medications - No data to display  Initial Impression / Assessment and Plan / UC Course  I have reviewed the triage vital signs and the nursing notes.  Pertinent labs & imaging results that were available during my care of the patient were reviewed by me and considered in my medical decision making (see chart for details).      Mildly hypertensive in triage and otherwise vital signs reassuring.  Urinalysis today showing possibly some slight dehydration, otherwise no obvious abnormalities.  She declines EKG, CBG, orthostatics and other workup today.  Discussed to hold the hydroxyzine and follow-up with primary care soon as possible for further evaluation and med management.  Supportive home care and return precautions reviewed.  Work note given.  45-minute spent today in patient care, evaluation, education. Final Clinical Impressions(s) / UC Diagnoses   Final diagnoses:  Anxiety  Dizziness     Discharge Instructions      Hold the hydroxyzine until you can follow-up with your primary care in case this is causing your symptoms.  Stay well-hydrated, go to the emergency department if your symptoms severely worsen at any time.    ED Prescriptions   None    PDMP not reviewed this encounter.   Particia Nearing, [place] 12/26/23 1050

## 2023-12-26 NOTE — Discharge Instructions (Signed)
 Hold the hydroxyzine until you can follow-up with your primary care in case this is causing your symptoms.  Stay well-hydrated, go to the emergency department if your symptoms severely worsen at any time.

## 2023-12-26 NOTE — ED Triage Notes (Signed)
 Pt reports she has a headache and some dizziness x 1 day   States pcp advised her to follow up with [facility] but she does not have any resources.

## 2023-12-28 ENCOUNTER — Ambulatory Visit: Payer: Medicare Other | Admitting: Internal Medicine

## 2024-01-02 ENCOUNTER — Ambulatory Visit
Admission: EM | Admit: 2024-01-02 | Discharge: 2024-01-02 | Disposition: A | Discharge status: Home / Self Care | Attending: Nurse Practitioner | Admitting: Nurse Practitioner

## 2024-01-02 DIAGNOSIS — I10 Essential (primary) hypertension: Secondary | ICD-10-CM | POA: Diagnosis not present

## 2024-01-02 DIAGNOSIS — G43901 Migraine, unspecified, not intractable, with status migrainosus: Secondary | ICD-10-CM

## 2024-01-02 NOTE — ED Provider Notes (Signed)
 [facility]    CSN: [id number] Arrival date & time: 01/02/24  1423      History   Chief Complaint No chief complaint on file.   HPI Jaclyn Brooks is a 63 y.o. female.   The history is provided by the patient.   Patient presents for complaints of headache, dizziness and elevated blood pressure.  Patient states symptoms have been present for the past 2 days.  Patient states that headache is on the right side of her head, and extends back towards the right neck.  She describes the pain as "a hurt".  Rates pain 7/10 at present.  She denies blurred vision, light sensitivity, sound sensitivity, lower extremity weakness, numbness, tingling, nausea, or vomiting.  Patient with history of migraines.  Patient states that she has been taking Tylenol for her symptoms.  States she is scheduled to see her PCP this week.  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[facility])     Patient Active Problem List   Diagnosis Date Noted   Anxiety 12/21/2023   Flank pain 09/19/2023   RUQ pain 09/19/2023   Chronic sinusitis 08/29/2023   Enlarged skin mole 08/29/2023   Dysphagia 07/23/2023   Reactive airway disease 07/06/2023   Loose bowel movements 07/06/2023   Primary osteoarthritis of left hip 05/05/2023   Dysuria 04/16/2023   Diarrhea of presumed infectious origin 02/01/2023   Intractable migraine with aura without status migrainosus 08/15/2022   Traumatic hematoma of right lower leg 07/13/2022   History of total abdominal hysterectomy 07/04/2022   GAD (generalized anxiety disorder) 04/28/2022   Caregiver stress 04/28/2022   Chronic left-sided low back pain without sciatica 04/13/2022   Contact dermatitis 04/13/2022   Primary osteoarthritis of right knee 03/09/2022   Refused influenza vaccine 12/02/2021    Encounter for general adult medical examination with abnormal findings 11/30/2021   Epidermal cyst 11/30/2021   Acute recurrent frontal sinusitis 09/21/2021   Allergic rhinitis 08/29/2021   Nausea 08/27/2021   Hemorrhoids 07/07/2021   Gout 03/25/2021   Type 2 diabetes mellitus with other specified complication (HCC) 07/14/2020   Primary osteoarthritis of right foot 05/12/2020   GERD (gastroesophageal reflux disease) 05/31/2019   Solitary kidney, acquired 05/10/2019   Carotid arterial disease (HCC) 12/11/2018   COPD, 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/01/2018   CKD (chronic kidney disease) stage 3, GFR 30-59 ml/min (HCC) 10/01/2018   Aortic valve sclerosis 10/01/2018   Hypertension 09/11/2018    Past Surgical History:  Procedure Laterality Date   CHOLECYSTECTOMY N/A 01/28/2015   Procedure: LAPAROSCOPIC CHOLECYSTECTOMY;  Surgeon: Franky Macho Md, MD;  Location: [facility];  Service: General;  Laterality: N/A;   excision of bone spurs Bilateral    Big toes   renal aneurysm Right    right coil-and only has function of left kidney   TOTAL ABDOMINAL HYSTERECTOMY     X-STOP IMPLANTATION      OB History     Gravida  1   Para  1   Term      Preterm  1   AB      Living         SAB      IAB      Ectopic      Multiple  Live Births               Home Medications    Prior to Admission medications   Medication Sig Start Date End Date Taking? Authorizing Provider  albuterol (VENTOLIN HFA) 108 (90 Base) MCG/ACT inhaler Inhale 1-2 puffs into the lungs every 6 (six) hours as needed for wheezing or shortness of breath. 12/21/23   Gilmore Laroche, FNP  allopurinol (ZYLOPRIM) 100 MG tablet Take 2 tablets by mouth once daily 12/19/23   Anabel Halon, MD  amoxicillin-clavulanate (AUGMENTIN) 875-125 MG tablet Take 1 tablet by mouth 2 (two) times daily. 11/29/23   Anabel Halon, MD  atenolol (TENORMIN) 50 MG tablet Take 1 tablet (50 mg total) by mouth daily. 08/29/23   Anabel Halon, MD   benzonatate (TESSALON) 100 MG capsule Take 1 capsule (100 mg total) by mouth 2 (two) times daily as needed for cough. 11/01/23   Anabel Halon, MD  budesonide-formoterol (SYMBICORT) 80-4.5 MCG/ACT inhaler Inhale 2 puffs into the lungs 2 (two) times daily. 12/21/23   Gilmore Laroche, FNP  butalbital-acetaminophen-caffeine (FIORICET) [phone] MG tablet Take 1 tablet by mouth every 12 (twelve) hours as needed for headache or migraine. 08/15/22   Patel, Earlie Lou, MD  CARTIA XT 240 MG 24 hr capsule Take 1 capsule (240 mg total) by mouth daily. 08/29/23   Anabel Halon, MD  cetirizine (ZYRTEC) 10 MG tablet Take 1 tablet (10 mg total) by mouth daily. 11/24/23   Leath-Warren, Sadie Haber, NP  Chlorphen-PE-Acetaminophen (NOREL AD) 4-10-325 MG TABS Take 1 tablet by mouth 3 (three) times daily as needed. 11/29/23   Anabel Halon, MD  clopidogrel (PLAVIX) 75 MG tablet Take 1 tablet (75 mg total) by mouth every evening. 08/29/23   Anabel Halon, MD  dexlansoprazole (DEXILANT) 60 MG capsule Take 1 capsule by mouth once daily 12/21/23   Anabel Halon, MD  dicyclomine (BENTYL) 10 MG capsule Take 1 capsule (10 mg total) by mouth 3 (three) times daily as needed for spasms. 07/06/23   Anabel Halon, MD  guaiFENesin-codeine 100-10 MG/5ML syrup Take 5 mLs by mouth 3 (three) times daily as needed for cough. 11/29/23   Anabel Halon, MD  hydrochlorothiazide (HYDRODIURIL) 25 MG tablet Take 1 tablet (25 mg total) by mouth daily. 08/29/23   Anabel Halon, MD  hydrOXYzine (ATARAX) 10 MG tablet Take 1 tablet (10 mg total) by mouth every 8 (eight) hours as needed for anxiety. 12/18/23   Valentino Nose, NP  methocarbamol (ROBAXIN) 500 MG tablet Take 1 tablet (500 mg total) by mouth every 8 (eight) hours as needed for muscle spasms. 09/22/23   Magnant, Charles L, PA-C  montelukast (SINGULAIR) 10 MG tablet Take 1 tablet (10 mg total) by mouth at bedtime. 07/06/23   Anabel Halon, MD  ondansetron (ZOFRAN-ODT) 4 MG  disintegrating tablet Take 1 tablet (4 mg total) by mouth every 8 (eight) hours as needed. 06/05/23   Leath-Warren, Sadie Haber, NP  atorvastatin (LIPITOR) 20 MG tablet Take 1 tablet (20 mg total) by mouth at bedtime. 11/25/19 02/18/20  Salley Scarlet, MD    Family History Family History  Problem Relation Age of Onset   Hypertension Mother    Heart disease Mother        Cardiac Arrest    Hypertension Father    Cancer Father    Hypertension Sister    Cancer Brother        Bladder Cancer  Hypertension Brother    Hypertension Sister     Social History Social History   Tobacco Use   Smoking status: Former    Current packs/day: 0.00    Average packs/day: 0.3 packs/day for 20.0 years (5.0 ttl pk-yrs)    Types: Cigarettes    Start date: 1999    Quit date: 2019    Years since quitting: 6.2   Smokeless tobacco: Never  Vaping Use   Vaping status: Never Used  Substance Use Topics   Alcohol use: Not Currently    Comment: occasionally   Drug use: No     Allergies   Bee venom, Nsaids, and Shellfish allergy   Review of Systems Review of Systems Per HPI  Physical Exam Triage Vital Signs ED Triage Vitals  Encounter Vitals Group     BP 01/02/24 1429 (!) 157/77     Systolic BP Percentile --      Diastolic BP Percentile --      Pulse --      Resp 01/02/24 1429 16     Temp 01/02/24 1429 98.5 F (36.9 C)     Temp Source 01/02/24 1429 Oral     SpO2 01/02/24 1429 96 %     Weight --      Height --      Head Circumference --      Peak Flow --      Pain Score 01/02/24 1428 8     Pain Loc --      Pain Education --      Exclude from Growth Chart --    No data found.  Updated Vital Signs BP (!) 157/77 (BP Location: Right Arm)   Pulse (!) 49   Temp 98.5 F (36.9 C) (Oral)   Resp 16   SpO2 96%   Visual Acuity Right Eye Distance:   Left Eye Distance:   Bilateral Distance:    Right Eye Near:   Left Eye Near:    Bilateral Near:     Physical Exam Vitals and  nursing note reviewed.  Constitutional:      General: She is not in acute distress.    Appearance: Normal appearance.  HENT:     Head: Normocephalic.     Right Ear: Tympanic membrane, ear canal and external ear normal.     Left Ear: Tympanic membrane, ear canal and external ear normal.     Nose: Nose normal.     Mouth/Throat:     Mouth: Mucous membranes are moist.  Eyes:     Extraocular Movements: Extraocular movements intact.     Conjunctiva/sclera: Conjunctivae normal.     Pupils: Pupils are equal, round, and reactive to light.  Cardiovascular:     Rate and Rhythm: Normal rate and regular rhythm.     Pulses: Normal pulses.     Heart sounds: Normal heart sounds.  Pulmonary:     Effort: Pulmonary effort is normal.     Breath sounds: Normal breath sounds.  Abdominal:     General: Bowel sounds are normal.     Palpations: Abdomen is soft.     Tenderness: There is no abdominal tenderness.  Musculoskeletal:     Cervical back: Normal range of motion.  Lymphadenopathy:     Cervical: No cervical adenopathy.  Skin:    General: Skin is warm and dry.  Neurological:     General: No focal deficit present.     Mental Status: She is alert and oriented to person, place, and  time.     GCS: GCS eye subscore is 4. GCS verbal subscore is 5. GCS motor subscore is 6.     Cranial Nerves: Cranial nerves 2-12 are intact.     Sensory: Sensation is intact.     Motor: Motor function is intact.     Coordination: Coordination is intact.     Gait: Gait is intact.  Psychiatric:        Mood and Affect: Mood normal.        Behavior: Behavior normal.     UC Treatments / Results  Labs (all labs ordered are listed, but only abnormal results are displayed) Labs Reviewed - No data to display  EKG   Radiology No results found.  Procedures Procedures (including critical care time)  Medications Ordered in UC Medications - No data to display  Initial Impression / Assessment and Plan / UC Course   I have reviewed the triage vital signs and the nursing notes.  Pertinent labs & imaging results that were available during my care of the patient were reviewed by me and considered in my medical decision making (see chart for details).  No neurological deficits noted on exam.  Patient's blood pressure is mildly elevated, but consistent with previous blood pressure readings at previous appointments.  She declines medication, particularly injections at this visit today.  States she will continue over-the-counter medication.  Supportive care recommendations were provided and discussed with the patient to include continuing over-the-counter analgesics, use of cool cloths across her forehead, or sleeping in a dimly lit room.  Patient was given strict ER follow-up precautions.  Patient advised to follow-up with PCP as scheduled.  Patient was in agreement with this plan of care and verbalizes understanding.  All questions were answered.  Patient stable for discharge.  Work note was provided.   Final Clinical Impressions(s) / UC Diagnoses   Final diagnoses:  Migraine with status migrainosus, not intractable, unspecified migraine type  Elevated blood pressure reading in office with diagnosis of hypertension     Discharge Instructions      Continue over-the-counter Tylenol for your headache. Recommend a cool cloth of caution her forehead or sleeping in a dimly lit room while symptoms persist. Make sure you are drinking plenty of fluids and getting plenty of rest. Avoid things that may trigger your headache symptoms to include increased stress, lack of sleep, or poor diet. Continue to monitor your blood pressure.  Make sure you are eating a diet that is low in sodium and and fat. Consider regular exercise to help keep your blood pressure controlled.  This will also help decrease your stress. Go to the emergency department immediately if you experience more severe headache, confusion, loss of  consciousness, or other concerns. Follow-up with your primary care physician as scheduled. Follow-up as needed.     ED Prescriptions   None    PDMP not reviewed this encounter.   Abran Cantor, NP 01/02/24 1458

## 2024-01-02 NOTE — Discharge Instructions (Addendum)
 Continue over-the-counter Tylenol for your headache. Recommend a cool cloth of caution her forehead or sleeping in a dimly lit room while symptoms persist. Make sure you are drinking plenty of fluids and getting plenty of rest. Avoid things that may trigger your headache symptoms to include increased stress, lack of sleep, or poor diet. Continue to monitor your blood pressure.  Make sure you are eating a diet that is low in sodium and and fat. Consider regular exercise to help keep your blood pressure controlled.  This will also help decrease your stress. Go to the emergency department immediately if you experience more severe headache, confusion, loss of consciousness, or other concerns. Follow-up with your primary care physician as scheduled. Follow-up as needed.

## 2024-01-02 NOTE — ED Triage Notes (Signed)
 Pt reports she has a headache x 2 days  States she needs a work note since she left early   Took tylenol

## 2024-01-04 ENCOUNTER — Ambulatory Visit: Payer: Medicare Other | Admitting: Internal Medicine

## 2024-01-04 ENCOUNTER — Other Ambulatory Visit (HOSPITAL_COMMUNITY): Payer: Self-pay | Admitting: Internal Medicine

## 2024-01-04 ENCOUNTER — Encounter: Payer: Self-pay | Admitting: Internal Medicine

## 2024-01-04 VITALS — BP 134/70 | HR 52 | Ht 66.0 in | Wt 176.2 lb

## 2024-01-04 DIAGNOSIS — E785 Hyperlipidemia, unspecified: Secondary | ICD-10-CM

## 2024-01-04 DIAGNOSIS — Z1211 Encounter for screening for malignant neoplasm of colon: Secondary | ICD-10-CM

## 2024-01-04 DIAGNOSIS — E1169 Type 2 diabetes mellitus with other specified complication: Secondary | ICD-10-CM | POA: Diagnosis not present

## 2024-01-04 DIAGNOSIS — F411 Generalized anxiety disorder: Secondary | ICD-10-CM

## 2024-01-04 DIAGNOSIS — Z0001 Encounter for general adult medical examination with abnormal findings: Secondary | ICD-10-CM

## 2024-01-04 DIAGNOSIS — R928 Other abnormal and inconclusive findings on diagnostic imaging of breast: Secondary | ICD-10-CM

## 2024-01-04 DIAGNOSIS — N1831 Chronic kidney disease, stage 3a: Secondary | ICD-10-CM | POA: Diagnosis not present

## 2024-01-04 DIAGNOSIS — J449 Chronic obstructive pulmonary disease, unspecified: Secondary | ICD-10-CM

## 2024-01-04 DIAGNOSIS — I10 Essential (primary) hypertension: Secondary | ICD-10-CM | POA: Diagnosis not present

## 2024-01-04 MED ORDER — FLUTICASONE-SALMETEROL 100-50 MCG/ACT IN AEPB: 1.0000 | INHALATION_SPRAY | Freq: Two times a day (BID) | RESPIRATORY_TRACT | 2 refills | Status: AC

## 2024-01-04 NOTE — Progress Notes (Signed)
 Established Patient Office Visit  Subjective:  Patient ID: Jaclyn Brooks, female    DOB: 1960/12/15  Age: 63 y.o. MRN: [id number]  CC:  Chief Complaint  Patient presents with   Care Management    Annual Physical     HPI Jaclyn Brooks is a 63 y.o. female with past medical history of HTN, carotid artery stenosis, COPD, GERD, diet controlled diabetes, OA, CKD stage 3b and renal artery aneurysm who presents for annual physical.  HTN: Her BP has been elevated at times lately due to stress at work and home. Her BP was wnl today. She denies any headache, dizziness, chest pain, dyspnea or palpitations currently.  She is currently taking atenolol 50 mg QD, Cartia 240 mg QD and hydrochlorothiazide 25 mg QD.  Her heart rate remains in 50s.  She did not tolerate hydralazine that was prescribed by nephrology.  CKD: She follows up with [facility].  Denies any dysuria, hematuria or urinary hesitancy or resistance currently.  She admits that she takes few [employer] every day.  She has chronic nasal congestion, postnasal drip and sinus pressure related headache.  Of note, she takes Singulair and Zyrtec PRN for her allergies currently.  She has tried OTC antihistaminics without much relief.  GERD: She has chronic acid reflux and epigastric discomfort.  She has tried omeprazole and pantoprazole without much relief.  She tolerates Dexilant well, but has difficulty obtaining it at times due to cost concern.   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[facility])     Past Surgical History:  Procedure Laterality Date   CHOLECYSTECTOMY N/A 01/28/2015   Procedure: LAPAROSCOPIC CHOLECYSTECTOMY;  Surgeon: Franky Macho Md, MD;  Location: [facility];  Service: General;  Laterality: N/A;   excision of bone spurs Bilateral     Big toes   renal aneurysm Right    right coil-and only has function of left kidney   TOTAL ABDOMINAL HYSTERECTOMY     X-STOP IMPLANTATION      Family History  Problem Relation Age of Onset   Hypertension Mother    Heart disease Mother        Cardiac Arrest    Hypertension Father    Cancer Father    Hypertension Sister    Cancer Brother        Bladder Cancer    Hypertension Brother    Hypertension Sister     Social History   Socioeconomic History   Marital status: Unknown    Spouse name: Not on file   Number of children: 1   Years of education: 12   Highest education level: Some college, no degree  Occupational History   Occupation: Retired  Tobacco Use   Smoking status: Former    Current packs/day: 0.00    Average packs/day: 0.3 packs/day for 20.0 years (5.0 ttl pk-yrs)    Types: Cigarettes    Start date: 1999    Quit date: 2019    Years since quitting: 6.2   Smokeless tobacco: Never  Vaping Use   Vaping status: Never Used  Substance and Sexual Activity   Alcohol use: Not Currently    Comment: occasionally   Drug use: No   Sexual activity: Yes    Birth  control/protection: Surgical  Other Topics Concern   Not on file  Social History Narrative   Not on file   Social Drivers of Health   Financial Resource Strain: Low Risk  (10/19/2021)   Overall Financial Resource Strain (CARDIA)    Difficulty of Paying Living Expenses: Not hard at all  Food Insecurity: No Food Insecurity (10/19/2021)   Hunger Vital Sign    Worried About Running Out of Food in the Last Year: Never true    Ran Out of Food in the Last Year: Never true  Transportation Needs: No Transportation Needs (10/19/2021)   PRAPARE - Administrator, Civil Service (Medical): No    Lack of Transportation (Non-Medical): No  Physical Activity: Sufficiently Active (10/19/2021)   Exercise Vital Sign    Days of Exercise per Week: 5 days    Minutes of Exercise per Session: 30 min  Stress: No Stress  Concern Present (10/19/2021)   [employer] of Occupational Health - Occupational Stress Questionnaire    Feeling of Stress : Not at all  Social Connections: Moderately Isolated (10/19/2021)   Social Connection and Isolation Panel [NHANES]    Frequency of Communication with Friends and Family: More than three times a week    Frequency of Social Gatherings with Friends and Family: More than three times a week    Attends Religious Services: More than 4 times per year    Active Member of [employer] or Organizations: No    Attends Banker Meetings: Never    Marital Status: Never married  Intimate Partner Violence: Not At Risk (10/19/2021)   Humiliation, Afraid, Rape, and Kick questionnaire    Fear of Current or Ex-Partner: No    Emotionally Abused: No    Physically Abused: No    Sexually Abused: No    Outpatient Medications Prior to Visit  Medication Sig Dispense Refill   albuterol (VENTOLIN HFA) 108 (90 Base) MCG/ACT inhaler Inhale 1-2 puffs into the lungs every 6 (six) hours as needed for wheezing or shortness of breath. 1 each 2   allopurinol (ZYLOPRIM) 100 MG tablet Take 2 tablets by mouth once daily 60 tablet 11   atenolol (TENORMIN) 50 MG tablet Take 1 tablet (50 mg total) by mouth daily. 90 tablet 1   benzonatate (TESSALON) 100 MG capsule Take 1 capsule (100 mg total) by mouth 2 (two) times daily as needed for cough. 30 capsule 0   butalbital-acetaminophen-caffeine (FIORICET) 50-325-40 MG tablet Take 1 tablet by mouth every 12 (twelve) hours as needed for headache or migraine. 20 tablet 0   CARTIA XT 240 MG 24 hr capsule Take 1 capsule (240 mg total) by mouth daily. 90 capsule 3   cetirizine (ZYRTEC) 10 MG tablet Take 1 tablet (10 mg total) by mouth daily. 30 tablet 0   clopidogrel (PLAVIX) 75 MG tablet Take 1 tablet (75 mg total) by mouth every evening. 90 tablet 3   dexlansoprazole (DEXILANT) 60 MG capsule Take 1 capsule by mouth once daily 30 capsule 0   dicyclomine  (BENTYL) 10 MG capsule Take 1 capsule (10 mg total) by mouth 3 (three) times daily as needed for spasms. 30 capsule 1   hydrochlorothiazide (HYDRODIURIL) 25 MG tablet Take 1 tablet (25 mg total) by mouth daily. 90 tablet 1   hydrOXYzine (ATARAX) 10 MG tablet Take 1 tablet (10 mg total) by mouth every 8 (eight) hours as needed for anxiety. 20 tablet 0   methocarbamol (ROBAXIN) 500 MG tablet Take 1 tablet (  500 mg total) by mouth every 8 (eight) hours as needed for muscle spasms. 30 tablet 1   montelukast (SINGULAIR) 10 MG tablet Take 1 tablet (10 mg total) by mouth at bedtime. 30 tablet 3   ondansetron (ZOFRAN-ODT) 4 MG disintegrating tablet Take 1 tablet (4 mg total) by mouth every 8 (eight) hours as needed. 20 tablet 0   guaiFENesin-codeine 100-10 MG/5ML syrup Take 5 mLs by mouth 3 (three) times daily as needed for cough. 120 mL 0   amoxicillin-clavulanate (AUGMENTIN) 875-125 MG tablet Take 1 tablet by mouth 2 (two) times daily. (Patient not taking: Reported on 01/04/2024) 14 tablet 0   budesonide-formoterol (SYMBICORT) 80-4.5 MCG/ACT inhaler Inhale 2 puffs into the lungs 2 (two) times daily. (Patient not taking: Reported on 01/04/2024) 1 each 2   Chlorphen-PE-Acetaminophen (NOREL AD) 4-10-325 MG TABS Take 1 tablet by mouth 3 (three) times daily as needed. 30 tablet 0   No facility-administered medications prior to visit.    Allergies  Allergen Reactions   Bee Venom Hives   Nsaids     Kidney disease    Shellfish Allergy Itching    ROS Review of Systems  Constitutional:  Negative for chills and fever.  HENT:  Positive for congestion, postnasal drip and sinus pressure. Negative for sinus pain.   Eyes:  Negative for pain and discharge.  Respiratory:  Negative for cough and shortness of breath.   Cardiovascular:  Negative for chest pain and palpitations.  Gastrointestinal:  Negative for abdominal pain, diarrhea and vomiting.  Endocrine: Negative for polydipsia and polyuria.  Genitourinary:   Negative for dysuria and hematuria.  Musculoskeletal:  Positive for arthralgias, back pain and neck pain. Negative for neck stiffness.  Skin:  Negative for rash.  Neurological:  Negative for dizziness and weakness.  Psychiatric/Behavioral:  Positive for agitation. Negative for behavioral problems. The patient is nervous/anxious.       Objective:    Physical Exam Vitals reviewed.  Constitutional:      General: She is not in acute distress.    Appearance: She is not diaphoretic.  HENT:     Head: Normocephalic and atraumatic.     Mouth/Throat:     Mouth: Mucous membranes are moist.  Eyes:     General: No scleral icterus.    Extraocular Movements: Extraocular movements intact.  Cardiovascular:     Rate and Rhythm: Normal rate and regular rhythm.     Heart sounds: Normal heart sounds. No murmur heard. Pulmonary:     Breath sounds: Normal breath sounds. No wheezing or rales.  Abdominal:     Palpations: Abdomen is soft.     Tenderness: There is no abdominal tenderness.  Musculoskeletal:     Cervical back: Neck supple. No tenderness.     Right lower leg: No edema.     Left lower leg: No edema.  Skin:    General: Skin is warm.     Findings: No rash.  Neurological:     General: No focal deficit present.     Mental Status: She is alert and oriented to person, place, and time.     Cranial Nerves: No cranial nerve deficit.     Sensory: No sensory deficit.     Motor: No weakness.  Psychiatric:        Mood and Affect: Mood normal.        Behavior: Behavior normal.     BP 134/70 (BP Location: Left Arm)   Pulse (!) 52   Ht 5\' 6"  (  1.676 m)   Wt 176 lb 3.2 oz (79.9 kg)   SpO2 97%   BMI 28.44 kg/m  Wt Readings from Last 3 Encounters:  01/04/24 176 lb 3.2 oz (79.9 kg)  12/21/23 184 lb 1.3 oz (83.5 kg)  11/01/23 187 lb (84.8 kg)    Lab Results  Component Value Date   TSH 2.43 06/19/2020   Lab Results  Component Value Date   WBC 8.4 01/15/2021   HGB 13.3 01/15/2021    HCT 39.3 01/15/2021   MCV 96.1 01/15/2021   PLT 313 01/15/2021   Lab Results  Component Value Date   NA 137 03/04/2021   K CANCELED 03/04/2021   CO2 28 (A) 03/21/2023   GLUCOSE 132 (H) 03/04/2021   BUN 20 03/21/2023   CREATININE 1.4 (A) 03/21/2023   BILITOT CANCELED 03/04/2021   ALKPHOS 60 01/26/2015   AST CANCELED 03/04/2021   ALT 30 (H) 03/04/2021   PROT 8.1 03/04/2021   ALBUMIN 4.0 01/26/2015   CALCIUM 10.5 (H) 03/04/2021   ANIONGAP 10 01/26/2015   EGFR 44.0 03/21/2023   Lab Results  Component Value Date   CHOL 157 06/19/2020   Lab Results  Component Value Date   HDL 24 (L) 06/19/2020   Lab Results  Component Value Date   LDLCALC 92 06/19/2020   Lab Results  Component Value Date   TRIG 287 (H) 06/19/2020   Lab Results  Component Value Date   CHOLHDL 6.5 (H) 06/19/2020   Lab Results  Component Value Date   HGBA1C 6.6 (H) 08/29/2023      Assessment & Plan:   Problem List Items Addressed This Visit       Cardiovascular and Mediastinum   Hypertension   BP Readings from Last 1 Encounters:  01/04/24 134/70   Well-controlled with Atenolol, Cartia and HCTZ Recent stress has caused fluctuation in her blood pressure Did not tolerate Hydralazine in the past Needs to cut down soft drink intake Counseled for compliance with the medications Advised DASH diet and moderate exercise/walking, at least 150 mins/week      Relevant Orders   CBC with Differential/Platelet   CMP14+EGFR   TSH     Respiratory   COPD, mild (HCC)   Well controlled with Symbicort, but is expensive -will try to switch to Advair Albuterol as needed      Relevant Medications   fluticasone-salmeterol (ADVAIR) 100-50 MCG/ACT AEPB     Endocrine   Type 2 diabetes mellitus with other specified complication (HCC) - Primary   Lab Results  Component Value Date   HGBA1C 6.6 (H) 08/29/2023   Diet controlled Associated with HTN, HLD and CKD Not on statin, does not prefer to take any  new medicine      Relevant Orders   CMP14+EGFR   Hemoglobin A1c     Genitourinary   CKD (chronic kidney disease) stage 3, GFR 30-59 ml/min (HCC)   Last BMP showed GFT around 34 in 12/24 Follows up with Nephrologist - Dr. Hyman Hopes, last visit note reviewed Avoid nephrotoxic agents Maintain adequate hydration      Relevant Orders   Vitamin D (25 hydroxy)     Other   Encounter for general adult medical examination with abnormal findings   Physical exam as documented. Fasting blood tests today. Advised to get Shingrix and Tdap vaccines at local pharmacy. Denies pneumococcal Vaccine.      GAD (generalized anxiety disorder)      01/04/2024    1:27 PM 12/21/2023  10:48 AM 08/29/2023   10:51 AM 07/06/2023    1:37 PM  GAD 7 : Generalized Anxiety Score  Nervous, Anxious, on Edge 2 2 0 1  Control/stop worrying 3 3 1 2   Worry too much - different things 3 3 1 2   Trouble relaxing 0 0 1 2  Restless 0 0 0 1  Easily annoyed or irritable 3 3 0 1  Afraid - awful might happen 0 0 1 0  Total GAD 7 Score 11 11 4 9   Anxiety Difficulty Somewhat difficult Somewhat difficult Somewhat difficult Somewhat difficult   Recent stress at work and home has worsened her anxiety Has hydroxyzine as needed for anxiety Prefers to avoid maintenance treatment with SSRI or SNRI Relaxation material provided in the previous visit Advised to contact therapist/counselor at her workplace      Relevant Orders   TSH   Other Visit Diagnoses       Hyperlipidemia, unspecified hyperlipidemia type       Relevant Orders   Lipid Profile     Abnormal mammogram       Relevant Orders   MM 3D DIAGNOSTIC MAMMOGRAM BILATERAL BREAST     Colon cancer screening       Relevant Orders   Ambulatory referral to Gastroenterology        Meds ordered this encounter  Medications   fluticasone-salmeterol (ADVAIR) 100-50 MCG/ACT AEPB    Sig: Inhale 1 puff into the lungs 2 (two) times daily.    Dispense:  60 each     Refill:  2    Follow-up: Return in about 6 months (around 07/06/2024) for HTN and GAD.    Anabel Halon, MD

## 2024-01-04 NOTE — Patient Instructions (Addendum)
 Please inquire about Medicare Prescription Payment Plan.  Please use Advair instead of Symbicort regularly. Please use Albuterol as needed for shortness of breath or wheezing.  Please continue to take medications as prescribed.  Please continue to follow low carb diet and perform moderate exercise/walking as tolerated.  Please consider getting Shingrix and Tdap vaccine at local pharmacy.

## 2024-01-04 NOTE — Assessment & Plan Note (Signed)
 Last BMP showed GFT around 34 in 12/24 Follows up with Nephrologist - Dr. Hyman Hopes, last visit note reviewed Avoid nephrotoxic agents Maintain adequate hydration

## 2024-01-04 NOTE — Assessment & Plan Note (Deleted)
 Recent stress at work and home has worsened her anxiety Has hydroxyzine as needed for anxiety Prefers to avoid maintenance treatment with SSRI or SNRI

## 2024-01-04 NOTE — Assessment & Plan Note (Addendum)
    01/04/2024    1:27 PM 12/21/2023   10:48 AM 08/29/2023   10:51 AM 07/06/2023    1:37 PM  GAD 7 : Generalized Anxiety Score  Nervous, Anxious, on Edge 2 2 0 1  Control/stop worrying 3 3 1 2   Worry too much - different things 3 3 1 2   Trouble relaxing 0 0 1 2  Restless 0 0 0 1  Easily annoyed or irritable 3 3 0 1  Afraid - awful might happen 0 0 1 0  Total GAD 7 Score 11 11 4 9   Anxiety Difficulty Somewhat difficult Somewhat difficult Somewhat difficult Somewhat difficult   Recent stress at work and home has worsened her anxiety Has hydroxyzine as needed for anxiety Prefers to avoid maintenance treatment with SSRI or SNRI Relaxation material provided in the previous visit Advised to contact therapist/counselor at her workplace

## 2024-01-04 NOTE — Assessment & Plan Note (Addendum)
 BP Readings from Last 1 Encounters:  01/04/24 134/70   Well-controlled with Atenolol, Cartia and HCTZ Recent stress has caused fluctuation in her blood pressure Did not tolerate Hydralazine in the past Needs to cut down soft drink intake Counseled for compliance with the medications Advised DASH diet and moderate exercise/walking, at least 150 mins/week

## 2024-01-04 NOTE — Assessment & Plan Note (Signed)
 Well controlled with Symbicort, but is expensive -will try to switch to Advair Albuterol as needed

## 2024-01-04 NOTE — Assessment & Plan Note (Signed)
 Physical exam as documented. Fasting blood tests today. Advised to get Shingrix and Tdap vaccines at local pharmacy. Denies pneumococcal Vaccine.

## 2024-01-04 NOTE — Assessment & Plan Note (Signed)
 Lab Results  Component Value Date   HGBA1C 6.6 (H) 08/29/2023   Diet controlled Associated with HTN, HLD and CKD Not on statin, does not prefer to take any new medicine

## 2024-01-05 LAB — LIPID PANEL
Chol/HDL Ratio: 6.5 ratio — ABNORMAL HIGH (ref 0.0–4.4)
Cholesterol, Total: 216 mg/dL — ABNORMAL HIGH (ref 100–199)
HDL: 33 mg/dL — ABNORMAL LOW (ref >39)
LDL Chol Calc (NIH): 145 mg/dL — ABNORMAL HIGH (ref 0–99)
Triglycerides: 206 mg/dL — ABNORMAL HIGH (ref 0–149)
VLDL Cholesterol Cal: 38 mg/dL (ref 5–40)

## 2024-01-05 LAB — CBC WITH DIFFERENTIAL/PLATELET
Basophils Absolute: 0 10*3/uL (ref 0.0–0.2)
Basos: 0 %
EOS (ABSOLUTE): 0.2 10*3/uL (ref 0.0–0.4)
Eos: 2 %
Hematocrit: 41.6 % (ref 34.0–46.6)
Hemoglobin: 14 g/dL (ref 11.1–15.9)
Immature Grans (Abs): 0 10*3/uL (ref 0.0–0.1)
Immature Granulocytes: 0 %
Lymphocytes Absolute: 2.6 10*3/uL (ref 0.7–3.1)
Lymphs: 38 %
MCH: 32.9 pg (ref 26.6–33.0)
MCHC: 33.7 g/dL (ref 31.5–35.7)
MCV: 98 fL — ABNORMAL HIGH (ref 79–97)
Monocytes Absolute: 0.5 10*3/uL (ref 0.1–0.9)
Monocytes: 8 %
Neutrophils Absolute: 3.6 10*3/uL (ref 1.4–7.0)
Neutrophils: 52 %
Platelets: 322 10*3/uL (ref 150–450)
RBC: 4.25 x10E6/uL (ref 3.77–5.28)
RDW: 13.4 % (ref 11.7–15.4)
WBC: 6.9 10*3/uL (ref 3.4–10.8)

## 2024-01-05 LAB — CMP14+EGFR
ALT: 20 IU/L (ref 0–32)
AST: 18 IU/L (ref 0–40)
Albumin: 4.1 g/dL (ref 3.9–4.9)
Alkaline Phosphatase: 68 IU/L (ref 44–121)
BUN/Creatinine Ratio: 21 (ref 12–28)
BUN: 32 mg/dL — ABNORMAL HIGH (ref 8–27)
Bilirubin Total: <0.2 mg/dL (ref 0.0–1.2)
CO2: 25 mmol/L (ref 20–29)
Calcium: 9.8 mg/dL (ref 8.7–10.3)
Chloride: 102 mmol/L (ref 96–106)
Creatinine, Ser: 1.5 mg/dL — ABNORMAL HIGH (ref 0.57–1.00)
Globulin, Total: 2.9 g/dL (ref 1.5–4.5)
Glucose: 76 mg/dL (ref 70–99)
Potassium: 3.8 mmol/L (ref 3.5–5.2)
Sodium: 140 mmol/L (ref 134–144)
Total Protein: 7 g/dL (ref 6.0–8.5)
eGFR: 39 mL/min/{1.73_m2} — ABNORMAL LOW (ref >59)

## 2024-01-05 LAB — TSH: TSH: 0.948 u[IU]/mL (ref 0.450–4.500)

## 2024-01-05 LAB — HEMOGLOBIN A1C
Est. average glucose Bld gHb Est-mCnc: 137 mg/dL
Hgb A1c MFr Bld: 6.4 % — ABNORMAL HIGH (ref 4.8–5.6)

## 2024-01-05 LAB — VITAMIN D 25 HYDROXY (VIT D DEFICIENCY, FRACTURES): Vit D, 25-Hydroxy: 25.9 ng/mL — ABNORMAL LOW (ref 30.0–100.0)

## 2024-01-08 ENCOUNTER — Encounter (INDEPENDENT_AMBULATORY_CARE_PROVIDER_SITE_OTHER): Payer: Self-pay | Admitting: *Deleted

## 2024-01-10 NOTE — Telephone Encounter (Signed)
 Copied from CRM [id number]. Topic: Appointments - Scheduling Inquiry for Clinic >> Jan 10, 2024 10:51 AM Nyra Capes wrote: Reason for CRM: Patient daughter Ignacia Palma calling in to schedule an appt with Dr Allena Katz for an issue that she had had before patient called daughter to have her schedule an appt. Daughter stated this appt is for a follow up.  Please call [facility] to schedule appt phone# [phone]

## 2024-01-16 ENCOUNTER — Ambulatory Visit
Admission: RE | Admit: 2024-01-16 | Discharge: 2024-01-16 | Disposition: A | Discharge status: Home / Self Care | Source: Ambulatory Visit | Attending: Nurse Practitioner | Admitting: Nurse Practitioner

## 2024-01-16 VITALS — BP 139/78 | HR 56 | Temp 98.2°F | Resp 18

## 2024-01-16 DIAGNOSIS — J069 Acute upper respiratory infection, unspecified: Secondary | ICD-10-CM

## 2024-01-16 LAB — POC COVID19/FLU A&B COMBO
Covid Antigen, POC: NEGATIVE
Influenza A Antigen, POC: NEGATIVE
Influenza B Antigen, POC: NEGATIVE

## 2024-01-16 NOTE — ED Triage Notes (Signed)
 Headache and body aches since yesterday and diarrhea.

## 2024-01-16 NOTE — Discharge Instructions (Addendum)
 You have a viral upper respiratory infection.  Symptoms should improve over the next week to 10 days.  If you develop chest pain or shortness of breath, go to the emergency room.  COVID-19 and influenza testing today is negative.  Some things that can make you feel better are: - Increased rest - Increasing fluid with water/sugar free electrolytes - Acetaminophen and ibuprofen as needed for fever/pain - Salt water gargling, chloraseptic spray and throat lozenges - OTC guaifenesin (Mucinex) 600 mg twice daily - Saline sinus flushes or a neti pot -Oral antihistamine to help with the congestion - Humidifying the air

## 2024-01-16 NOTE — ED Provider Notes (Signed)
 [facility]    CSN: [id number] Arrival date & time: 01/16/24  1437      History   Chief Complaint Chief Complaint  Patient presents with   Leg Pain    Entered by patient   Generalized Body Aches    HPI Jaclyn Brooks is a 63 y.o. female.   Patient presents today with 1 day history of bodyaches, runny and stuffy nose, sore throat, headache, abdominal pain, nausea, diarrhea, and fatigue.  She denies any known fevers, cough, shortness of breath or chest pain, ear pain, vomiting, and change in appetite.  Reports whenever if she has eaten the past 24 hours, it has made her have to use the bathroom.  Has not taken anything for symptoms so far.  No known sick contacts.  Of note, patient reports her mother passed away approximately 5 days ago.     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[facility])     Patient Active Problem List   Diagnosis Date Noted   Anxiety 12/21/2023   Flank pain 09/19/2023   RUQ pain 09/19/2023   Chronic sinusitis 08/29/2023   Enlarged skin mole 08/29/2023   Dysphagia 07/23/2023   Reactive airway disease 07/06/2023   Loose bowel movements 07/06/2023   Primary osteoarthritis of left hip 05/05/2023   Dysuria 04/16/2023   Diarrhea of presumed infectious origin 02/01/2023   Intractable migraine with aura without status migrainosus 08/15/2022   Traumatic hematoma of right lower leg 07/13/2022   History of total abdominal hysterectomy 07/04/2022   GAD (generalized anxiety disorder) 04/28/2022   Caregiver stress 04/28/2022   Chronic left-sided low back pain without sciatica 04/13/2022   Contact dermatitis 04/13/2022   Primary osteoarthritis of right knee 03/09/2022   Refused influenza vaccine 12/02/2021   Encounter for general adult medical examination with abnormal  findings 11/30/2021   Epidermal cyst 11/30/2021   Acute recurrent frontal sinusitis 09/21/2021   Allergic rhinitis 08/29/2021   Nausea 08/27/2021   Hemorrhoids 07/07/2021   Gout 03/25/2021   Type 2 diabetes mellitus with other specified complication (HCC) 07/14/2020   Primary osteoarthritis of right foot 05/12/2020   GERD (gastroesophageal reflux disease) 05/31/2019   Solitary kidney, acquired 05/10/2019   Carotid arterial disease (HCC) 12/11/2018   COPD, 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/01/2018   CKD (chronic kidney disease) stage 3, GFR 30-59 ml/min (HCC) 10/01/2018   Aortic valve sclerosis 10/01/2018   Hypertension 09/11/2018    Past Surgical History:  Procedure Laterality Date   CHOLECYSTECTOMY N/A 01/28/2015   Procedure: LAPAROSCOPIC CHOLECYSTECTOMY;  Surgeon: Franky Macho Md, MD;  Location: [facility];  Service: General;  Laterality: N/A;   excision of bone spurs Bilateral    Big toes   renal aneurysm Right    right coil-and only has function of left kidney   TOTAL ABDOMINAL HYSTERECTOMY     X-STOP IMPLANTATION      OB History     Gravida  1   Para  1   Term      Preterm  1   AB      Living         SAB      IAB      Ectopic      Multiple  Live Births               Home Medications    Prior to Admission medications   Medication Sig Start Date End Date Taking? Authorizing Provider  albuterol (VENTOLIN HFA) 108 (90 Base) MCG/ACT inhaler Inhale 1-2 puffs into the lungs every 6 (six) hours as needed for wheezing or shortness of breath. 12/21/23   Gilmore Laroche, FNP  allopurinol (ZYLOPRIM) 100 MG tablet Take 2 tablets by mouth once daily 12/19/23   Anabel Halon, MD  atenolol (TENORMIN) 50 MG tablet Take 1 tablet (50 mg total) by mouth daily. 08/29/23   Anabel Halon, MD  benzonatate (TESSALON) 100 MG capsule Take 1 capsule (100 mg total) by mouth 2 (two) times daily as needed for cough. 11/01/23   Anabel Halon, MD  butalbital-acetaminophen-caffeine (FIORICET)  [phone] MG tablet Take 1 tablet by mouth every 12 (twelve) hours as needed for headache or migraine. 08/15/22   Patel, Earlie Lou, MD  CARTIA XT 240 MG 24 hr capsule Take 1 capsule (240 mg total) by mouth daily. 08/29/23   Anabel Halon, MD  cetirizine (ZYRTEC) 10 MG tablet Take 1 tablet (10 mg total) by mouth daily. 11/24/23   Leath-Warren, Sadie Haber, NP  clopidogrel (PLAVIX) 75 MG tablet Take 1 tablet (75 mg total) by mouth every evening. 08/29/23   Anabel Halon, MD  dexlansoprazole (DEXILANT) 60 MG capsule Take 1 capsule by mouth once daily 12/21/23   Anabel Halon, MD  dicyclomine (BENTYL) 10 MG capsule Take 1 capsule (10 mg total) by mouth 3 (three) times daily as needed for spasms. 07/06/23   Anabel Halon, MD  fluticasone-salmeterol (ADVAIR) 100-50 MCG/ACT AEPB Inhale 1 puff into the lungs 2 (two) times daily. 01/04/24   Anabel Halon, MD  hydrochlorothiazide (HYDRODIURIL) 25 MG tablet Take 1 tablet (25 mg total) by mouth daily. 08/29/23   Anabel Halon, MD  hydrOXYzine (ATARAX) 10 MG tablet Take 1 tablet (10 mg total) by mouth every 8 (eight) hours as needed for anxiety. 12/18/23   Valentino Nose, NP  methocarbamol (ROBAXIN) 500 MG tablet Take 1 tablet (500 mg total) by mouth every 8 (eight) hours as needed for muscle spasms. 09/22/23   Magnant, Charles L, PA-C  montelukast (SINGULAIR) 10 MG tablet Take 1 tablet (10 mg total) by mouth at bedtime. 07/06/23   Anabel Halon, MD  ondansetron (ZOFRAN-ODT) 4 MG disintegrating tablet Take 1 tablet (4 mg total) by mouth every 8 (eight) hours as needed. 06/05/23   Leath-Warren, Sadie Haber, NP  atorvastatin (LIPITOR) 20 MG tablet Take 1 tablet (20 mg total) by mouth at bedtime. 11/25/19 02/18/20  Salley Scarlet, MD    Family History Family History  Problem Relation Age of Onset   Hypertension Mother    Heart disease Mother        Cardiac Arrest    Hypertension Father    Cancer Father    Hypertension Sister    Cancer Brother         Bladder Cancer    Hypertension Brother    Hypertension Sister     Social History Social History   Tobacco Use   Smoking status: Former    Current packs/day: 0.00    Average packs/day: 0.3 packs/day for 20.0 years (5.0 ttl pk-yrs)    Types: Cigarettes    Start date: 1999    Quit date: 2019    Years since quitting: 6.2   Smokeless  tobacco: Never  Vaping Use   Vaping status: Never Used  Substance Use Topics   Alcohol use: Not Currently    Comment: occasionally   Drug use: No     Allergies   Amoxil [amoxicillin], Bee venom, Nsaids, and Shellfish allergy   Review of Systems Review of Systems Per HPI  Physical Exam Triage Vital Signs ED Triage Vitals  Encounter Vitals Group     BP 01/16/24 1507 139/78     Systolic BP Percentile --      Diastolic BP Percentile --      Pulse Rate 01/16/24 1507 (!) 56     Resp 01/16/24 1507 18     Temp 01/16/24 1507 98.2 F (36.8 C)     Temp Source 01/16/24 1507 Oral     SpO2 01/16/24 1507 97 %     Weight --      Height --      Head Circumference --      Peak Flow --      Pain Score 01/16/24 1508 7     Pain Loc --      Pain Education --      Exclude from Growth Chart --    No data found.  Updated Vital Signs BP 139/78 (BP Location: Right Arm)   Pulse (!) 56   Temp 98.2 F (36.8 C) (Oral)   Resp 18   SpO2 97%   Visual Acuity Right Eye Distance:   Left Eye Distance:   Bilateral Distance:    Right Eye Near:   Left Eye Near:    Bilateral Near:     Physical Exam Vitals and nursing note reviewed.  Constitutional:      General: She is not in acute distress.    Appearance: Normal appearance. She is not ill-appearing or toxic-appearing.  HENT:     Head: Normocephalic and atraumatic.     Right Ear: Tympanic membrane, ear canal and external ear normal.     Left Ear: Tympanic membrane, ear canal and external ear normal.     Nose: No congestion or rhinorrhea.     Mouth/Throat:     Mouth: Mucous membranes are moist.      Pharynx: Oropharynx is clear. No oropharyngeal exudate or posterior oropharyngeal erythema.  Eyes:     General: No scleral icterus.    Extraocular Movements: Extraocular movements intact.  Cardiovascular:     Rate and Rhythm: Normal rate and regular rhythm.  Pulmonary:     Effort: Pulmonary effort is normal. No respiratory distress.     Breath sounds: Normal breath sounds. No wheezing, rhonchi or rales.  Abdominal:     General: Abdomen is flat. Bowel sounds are normal. There is no distension.     Palpations: Abdomen is soft.     Tenderness: There is no abdominal tenderness. There is no right CVA tenderness, left CVA tenderness, guarding or rebound.  Musculoskeletal:     Cervical back: Normal range of motion and neck supple.  Lymphadenopathy:     Cervical: No cervical adenopathy.  Skin:    General: Skin is warm and dry.     Coloration: Skin is not jaundiced or pale.     Findings: No erythema or rash.  Neurological:     Mental Status: She is alert and oriented to person, place, and time.  Psychiatric:        Behavior: Behavior is cooperative.      UC Treatments / Results  Labs (all labs ordered are listed,  but only abnormal results are displayed) Labs Reviewed  POC COVID19/FLU A&B COMBO - Normal    EKG   Radiology No results found.  Procedures Procedures (including critical care time)  Medications Ordered in UC Medications - No data to display  Initial Impression / Assessment and Plan / UC Course  I have reviewed the triage vital signs and the nursing notes.  Pertinent labs & imaging results that were available during my care of the patient were reviewed by me and considered in my medical decision making (see chart for details).   Patient is well-appearing, normotensive, afebrile, not tachycardic, not tachypneic, oxygenating well on room air.    1. Viral URI Vitals and exam are reassuring today Suspect viral etiology COVID-19, influenza testing is negative  today Supportive care discussed with patient Recommended oral antihistamine, guaifenesin as needed for congestion Return and ER precautions discussed Work excuse provided  The patient was given the opportunity to ask questions.  All questions answered to their satisfaction.  The patient is in agreement to this plan.   Final Clinical Impressions(s) / UC Diagnoses   Final diagnoses:  Viral URI     Discharge Instructions      You have a viral upper respiratory infection.  Symptoms should improve over the next week to 10 days.  If you develop chest pain or shortness of breath, go to the emergency room.  COVID-19 and influenza testing today is negative.  Some things that can make you feel better are: - Increased rest - Increasing fluid with water/sugar free electrolytes - Acetaminophen and ibuprofen as needed for fever/pain - Salt water gargling, chloraseptic spray and throat lozenges - OTC guaifenesin (Mucinex) 600 mg twice daily - Saline sinus flushes or a neti pot -Oral antihistamine to help with the congestion - Humidifying the air   ED Prescriptions   None    PDMP not reviewed this encounter.   Valentino Nose, NP 01/16/24 [id number]

## 2024-01-19 ENCOUNTER — Ambulatory Visit
Admission: EM | Admit: 2024-01-19 | Discharge: 2024-01-19 | Disposition: A | Discharge status: Home / Self Care | Attending: Internal Medicine | Admitting: Internal Medicine

## 2024-01-19 ENCOUNTER — Ambulatory Visit: Payer: Self-pay

## 2024-01-19 ENCOUNTER — Encounter: Payer: Self-pay | Admitting: Emergency Medicine

## 2024-01-19 DIAGNOSIS — J0111 Acute recurrent frontal sinusitis: Secondary | ICD-10-CM

## 2024-01-19 DIAGNOSIS — J069 Acute upper respiratory infection, unspecified: Secondary | ICD-10-CM

## 2024-01-19 MED ORDER — BENZONATATE 100 MG PO CAPS: 100.0000 mg | ORAL_CAPSULE | Freq: Two times a day (BID) | ORAL | 0 refills | Status: DC | PRN

## 2024-01-19 MED ORDER — GUAIFENESIN ER 600 MG PO TB12: 600.0000 mg | ORAL_TABLET | Freq: Two times a day (BID) | ORAL | 0 refills | Status: DC

## 2024-01-19 NOTE — Discharge Instructions (Addendum)

## 2024-01-19 NOTE — ED Provider Notes (Signed)
 [facility]    CSN: [id number] Arrival date & time: 01/19/24  1638      History   Chief Complaint No chief complaint on file.   HPI Jaclyn Brooks is a 63 y.o. female.   Jaclyn Brooks is a 63 y.o. female presenting for chief complaint of body aches and cough that started approximately 5 days ago.  Cough is mostly dry and nonproductive.  She was seen for similar symptoms 3 days ago where she tested negative for COVID and flu.  She was advised to use over-the-counter medications for symptomatic relief.  She is taking Tylenol with some relief.  History of COPD and asthma, using maintenance inhalers.  She has not needed to use her rescue inhaler.  Denies shortness of breath, chest pain, nausea, vomiting, diarrhea, and abdominal pain.  No rash or fever/chills.      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[facility])     Patient Active Problem List   Diagnosis Date Noted   Anxiety 12/21/2023   Flank pain 09/19/2023   RUQ pain 09/19/2023   Chronic sinusitis 08/29/2023   Enlarged skin mole 08/29/2023   Dysphagia 07/23/2023   Reactive airway disease 07/06/2023   Loose bowel movements 07/06/2023   Primary osteoarthritis of left hip 05/05/2023   Dysuria 04/16/2023   Diarrhea of presumed infectious origin 02/01/2023   Intractable migraine with aura without status migrainosus 08/15/2022   Traumatic hematoma of right lower leg 07/13/2022   History of total abdominal hysterectomy 07/04/2022   GAD (generalized anxiety disorder) 04/28/2022   Caregiver stress 04/28/2022   Chronic left-sided low back pain without sciatica 04/13/2022   Contact dermatitis 04/13/2022   Primary osteoarthritis of right knee 03/09/2022   Refused influenza vaccine 12/02/2021   Encounter for general adult medical examination with  abnormal findings 11/30/2021   Epidermal cyst 11/30/2021   Acute recurrent frontal sinusitis 09/21/2021   Allergic rhinitis 08/29/2021   Nausea 08/27/2021   Hemorrhoids 07/07/2021   Gout 03/25/2021   Type 2 diabetes mellitus with other specified complication (HCC) 07/14/2020   Primary osteoarthritis of right foot 05/12/2020   GERD (gastroesophageal reflux disease) 05/31/2019   Solitary kidney, acquired 05/10/2019   Carotid arterial disease (HCC) 12/11/2018   COPD, 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/01/2018   CKD (chronic kidney disease) stage 3, GFR 30-59 ml/min (HCC) 10/01/2018   Aortic valve sclerosis 10/01/2018   Hypertension 09/11/2018    Past Surgical History:  Procedure Laterality Date   CHOLECYSTECTOMY N/A 01/28/2015   Procedure: LAPAROSCOPIC CHOLECYSTECTOMY;  Surgeon: Franky Macho Md, MD;  Location: [facility];  Service: General;  Laterality: N/A;   excision of bone spurs Bilateral    Big toes   renal aneurysm Right    right coil-and only has function of left kidney   TOTAL ABDOMINAL HYSTERECTOMY     X-STOP IMPLANTATION      OB History     Gravida  1   Para  1   Term      Preterm  1   AB      Living         SAB      IAB      Ectopic      Multiple  Live Births               Home Medications    Prior to Admission medications   Medication Sig Start Date End Date Taking? Authorizing Provider  guaiFENesin (MUCINEX) 600 MG 12 hr tablet Take 1 tablet (600 mg total) by mouth 2 (two) times daily. 01/19/24  Yes Carlisle Beers, FNP  albuterol (VENTOLIN HFA) 108 (90 Base) MCG/ACT inhaler Inhale 1-2 puffs into the lungs every 6 (six) hours as needed for wheezing or shortness of breath. 12/21/23   Gilmore Laroche, FNP  allopurinol (ZYLOPRIM) 100 MG tablet Take 2 tablets by mouth once daily 12/19/23   Anabel Halon, MD  atenolol (TENORMIN) 50 MG tablet Take 1 tablet (50 mg total) by mouth daily. 08/29/23   Anabel Halon, MD  benzonatate (TESSALON) 100 MG capsule Take  1 capsule (100 mg total) by mouth 2 (two) times daily as needed for cough. 01/19/24   Carlisle Beers, FNP  butalbital-acetaminophen-caffeine (FIORICET) [phone] MG tablet Take 1 tablet by mouth every 12 (twelve) hours as needed for headache or migraine. 08/15/22   Patel, Earlie Lou, MD  CARTIA XT 240 MG 24 hr capsule Take 1 capsule (240 mg total) by mouth daily. 08/29/23   Anabel Halon, MD  cetirizine (ZYRTEC) 10 MG tablet Take 1 tablet (10 mg total) by mouth daily. 11/24/23   Leath-Warren, Sadie Haber, NP  clopidogrel (PLAVIX) 75 MG tablet Take 1 tablet (75 mg total) by mouth every evening. 08/29/23   Anabel Halon, MD  dexlansoprazole (DEXILANT) 60 MG capsule Take 1 capsule by mouth once daily 12/21/23   Anabel Halon, MD  dicyclomine (BENTYL) 10 MG capsule Take 1 capsule (10 mg total) by mouth 3 (three) times daily as needed for spasms. 07/06/23   Anabel Halon, MD  fluticasone-salmeterol (ADVAIR) 100-50 MCG/ACT AEPB Inhale 1 puff into the lungs 2 (two) times daily. 01/04/24   Anabel Halon, MD  hydrochlorothiazide (HYDRODIURIL) 25 MG tablet Take 1 tablet (25 mg total) by mouth daily. 08/29/23   Anabel Halon, MD  hydrOXYzine (ATARAX) 10 MG tablet Take 1 tablet (10 mg total) by mouth every 8 (eight) hours as needed for anxiety. 12/18/23   Valentino Nose, NP  methocarbamol (ROBAXIN) 500 MG tablet Take 1 tablet (500 mg total) by mouth every 8 (eight) hours as needed for muscle spasms. 09/22/23   Magnant, Charles L, PA-C  montelukast (SINGULAIR) 10 MG tablet Take 1 tablet (10 mg total) by mouth at bedtime. 07/06/23   Anabel Halon, MD  ondansetron (ZOFRAN-ODT) 4 MG disintegrating tablet Take 1 tablet (4 mg total) by mouth every 8 (eight) hours as needed. 06/05/23   Leath-Warren, Sadie Haber, NP  atorvastatin (LIPITOR) 20 MG tablet Take 1 tablet (20 mg total) by mouth at bedtime. 11/25/19 02/18/20  Salley Scarlet, MD    Family History Family History  Problem Relation Age of Onset    Hypertension Mother    Heart disease Mother        Cardiac Arrest    Hypertension Father    Cancer Father    Hypertension Sister    Cancer Brother        Bladder Cancer    Hypertension Brother    Hypertension Sister     Social History Social History   Tobacco Use   Smoking status: Former    Current packs/day: 0.00    Average packs/day: 0.3 packs/day for 20.0 years (5.0 ttl pk-yrs)  Types: Cigarettes    Start date: 70    Quit date: 2019    Years since quitting: 6.2   Smokeless tobacco: Never  Vaping Use   Vaping status: Never Used  Substance Use Topics   Alcohol use: Not Currently    Comment: occasionally   Drug use: No     Allergies   Amoxil [amoxicillin], Bee venom, Nsaids, and Shellfish allergy   Review of Systems Review of Systems Per HPI  Physical Exam Triage Vital Signs ED Triage Vitals  Encounter Vitals Group     BP 01/19/24 1644 (!) 151/67     Systolic BP Percentile --      Diastolic BP Percentile --      Pulse Rate 01/19/24 1644 (!) 50     Resp 01/19/24 1644 18     Temp 01/19/24 1644 97.8 F (36.6 C)     Temp Source 01/19/24 1644 Oral     SpO2 01/19/24 1644 97 %     Weight --      Height --      Head Circumference --      Peak Flow --      Pain Score 01/19/24 1646 8     Pain Loc --      Pain Education --      Exclude from Growth Chart --    No data found.  Updated Vital Signs BP (!) 151/67 (BP Location: Right Arm)   Pulse (!) 50   Temp 97.8 F (36.6 C) (Oral)   Resp 18   SpO2 97%   Visual Acuity Right Eye Distance:   Left Eye Distance:   Bilateral Distance:    Right Eye Near:   Left Eye Near:    Bilateral Near:     Physical Exam Vitals and nursing note reviewed.  Constitutional:      Appearance: She is not ill-appearing or toxic-appearing.  HENT:     Head: Normocephalic and atraumatic.     Right Ear: Hearing, tympanic membrane, ear canal and external ear normal.     Left Ear: Hearing, tympanic membrane, ear canal and  external ear normal.     Nose: Congestion present.     Mouth/Throat:     Lips: Pink.     Mouth: Mucous membranes are moist. No injury or oral lesions.     Dentition: Normal dentition.     Tongue: No lesions.     Pharynx: Oropharynx is clear. Uvula midline. No pharyngeal swelling, oropharyngeal exudate, posterior oropharyngeal erythema, uvula swelling or postnasal drip.     Tonsils: No tonsillar exudate.  Eyes:     General: Lids are normal. Vision grossly intact. Gaze aligned appropriately.     Extraocular Movements: Extraocular movements intact.     Conjunctiva/sclera: Conjunctivae normal.  Neck:     Trachea: Trachea and phonation normal.  Cardiovascular:     Rate and Rhythm: Normal rate and regular rhythm.     Heart sounds: Normal heart sounds, S1 normal and S2 normal.  Pulmonary:     Effort: Pulmonary effort is normal. No respiratory distress.     Breath sounds: Normal breath sounds and air entry. No wheezing, rhonchi or rales.  Chest:     Chest wall: No tenderness.  Musculoskeletal:     Cervical back: Neck supple.  Lymphadenopathy:     Cervical: No cervical adenopathy.  Skin:    General: Skin is warm and dry.     Capillary Refill: Capillary refill takes less than 2 seconds.  Findings: No rash.  Neurological:     General: No focal deficit present.     Mental Status: She is alert and oriented to person, place, and time. Mental status is at baseline.     Cranial Nerves: No dysarthria or facial asymmetry.  Psychiatric:        Mood and Affect: Mood normal.        Speech: Speech normal.        Behavior: Behavior normal.        Thought Content: Thought content normal.        Judgment: Judgment normal.      UC Treatments / Results  Labs (all labs ordered are listed, but only abnormal results are displayed) Labs Reviewed - No data to display  EKG   Radiology No results found.  Procedures Procedures (including critical care time)  Medications Ordered in  UC Medications - No data to display  Initial Impression / Assessment and Plan / UC Course  I have reviewed the triage vital signs and the nursing notes.  Pertinent labs & imaging results that were available during my care of the patient were reviewed by me and considered in my medical decision making (see chart for details).   1. Viral URI with cough Suspect viral URI, viral syndrome.  Strep/viral testing: POC COVID-19 and flu testing negative 3 days ago, no clinical indication to repeat this today due to time frame of illness.  Physical exam findings reassuring, vital signs hemodynamically stable, and lungs clear, therefore deferred imaging of the chest.  Advised supportive care/prescriptions for symptomatic relief as outlined in AVS.    Counseled patient on potential for adverse effects with medications prescribed/recommended today, strict ER and return-to-clinic precautions discussed, patient verbalized understanding.    Final Clinical Impressions(s) / UC Diagnoses   Final diagnoses:  Viral URI with cough     Discharge Instructions      You have a viral illness which will improve on its own with rest, fluids, and medications to help with your symptoms.  Tylenol, guaifenesin (plain mucinex), and saline nasal sprays may help relieve symptoms.   Two teaspoons of honey in 1 cup of warm water every 4-6 hours may help with throat pains.  Humidifier in room at nighttime may help soothe cough (clean well daily).   For chest pain, shortness of breath, inability to keep food or fluids down without vomiting, fever that does not respond to tylenol or motrin, or any other severe symptoms, please go to the ER for further evaluation. Return to urgent care as needed, otherwise follow-up with PCP.     ED Prescriptions     Medication Sig Dispense Auth. Provider   guaiFENesin (MUCINEX) 600 MG 12 hr tablet Take 1 tablet (600 mg total) by mouth 2 (two) times daily. 30 tablet Reita May M, FNP   benzonatate (TESSALON) 100 MG capsule Take 1 capsule (100 mg total) by mouth 2 (two) times daily as needed for cough. 20 capsule Carlisle Beers, FNP      PDMP not reviewed this encounter.   Carlisle Beers, [place] 01/19/24 1709

## 2024-01-19 NOTE — ED Triage Notes (Signed)
 Was seen on 4/8 for same.  C/o of body aches all over with headache.

## 2024-01-30 ENCOUNTER — Ambulatory Visit: Payer: Self-pay | Admitting: Internal Medicine

## 2024-02-02 ENCOUNTER — Ambulatory Visit
Admission: RE | Admit: 2024-02-02 | Discharge: 2024-02-02 | Disposition: A | Discharge status: Home / Self Care | Source: Ambulatory Visit | Attending: Nurse Practitioner | Admitting: Nurse Practitioner

## 2024-02-02 VITALS — BP 150/76 | HR 47 | Temp 98.2°F | Resp 16

## 2024-02-02 DIAGNOSIS — M79652 Pain in left thigh: Secondary | ICD-10-CM

## 2024-02-02 DIAGNOSIS — M79605 Pain in left leg: Secondary | ICD-10-CM

## 2024-02-02 DIAGNOSIS — G8929 Other chronic pain: Secondary | ICD-10-CM

## 2024-02-02 MED ORDER — TIZANIDINE HCL 4 MG PO TABS: 4.0000 mg | ORAL_TABLET | Freq: Three times a day (TID) | ORAL | 0 refills | Status: DC | PRN

## 2024-02-02 NOTE — ED Provider Notes (Addendum)
 [facility]    CSN: [id number] Arrival date & time: 02/02/24  1531      History   Chief Complaint Chief Complaint  Patient presents with   Leg Pain    Entered by patient    HPI Jaclyn Brooks is a 63 y.o. female.   The history is provided by the patient.   Patient presents today with ongoing left lower extremity pain.she describes the pain as "burning." this is a chronic condition for the patient.  The pain is located in the upper left thigh on the front side and it radiates down from her left hip.  Patient has been seen by orthopedics and has received steroid injections with only minimal improvement of her symptoms.  Reports she was seen here in the past for the same or similar symptoms and was prescribed tizanidine  which helped, she is requesting another prescription for the tizanidine .  She is scheduled to see vascular in May.   No new injury to the hip or thigh.  Reports pain worsens after working, walks up and down steps and goes up and down ladders a lot at work.  Takes Tylenol  for the pain which helps dull it temporarily.  No lower extremity weakness or swelling.  No numbness or tingling in the toes.  No saddle anesthesia or new urinary symptoms.   Heart rate is noted to be low today.  Patient has presented with bradycardia in the past.  No complaints of chest pain, shortness of breath, difficulty breathing, lightheadedness, or dizziness. 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[facility])     Patient Active Problem List   Diagnosis Date Noted   Anxiety 12/21/2023   Flank pain 09/19/2023   RUQ pain 09/19/2023   Chronic sinusitis 08/29/2023   Enlarged skin mole 08/29/2023   Dysphagia 07/23/2023   Reactive airway disease 07/06/2023   Loose bowel movements 07/06/2023   Primary osteoarthritis  of left hip 05/05/2023   Dysuria 04/16/2023   Diarrhea of presumed infectious origin 02/01/2023   Intractable migraine with aura without status migrainosus 08/15/2022   Traumatic hematoma of right lower leg 07/13/2022   History of total abdominal hysterectomy 07/04/2022   GAD (generalized anxiety disorder) 04/28/2022   Caregiver stress 04/28/2022   Chronic left-sided low back pain without sciatica 04/13/2022   Contact dermatitis 04/13/2022   Primary osteoarthritis of right knee 03/09/2022   Refused influenza vaccine 12/02/2021   Encounter for general adult medical examination with abnormal findings 11/30/2021   Epidermal cyst 11/30/2021   Acute recurrent frontal sinusitis 09/21/2021   Allergic rhinitis 08/29/2021   Nausea 08/27/2021   Hemorrhoids 07/07/2021   Gout 03/25/2021   Type 2 diabetes mellitus with other specified complication (HCC) 07/14/2020   Primary osteoarthritis of right foot 05/12/2020   GERD (gastroesophageal reflux disease) 05/31/2019   Solitary kidney, acquired 05/10/2019   Carotid arterial disease (HCC) 12/11/2018   COPD, 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/01/2018   CKD (chronic kidney disease) stage 3, GFR 30-59 ml/min (HCC) 10/01/2018   Aortic valve sclerosis 10/01/2018   Hypertension 09/11/2018    Past Surgical History:  Procedure Laterality Date   CHOLECYSTECTOMY N/A 01/28/2015   Procedure: LAPAROSCOPIC CHOLECYSTECTOMY;  Surgeon: Alanda Allegra Md, MD;  Location: [facility];  Service: General;  Laterality: N/A;   excision of bone spurs Bilateral    Big toes   renal aneurysm Right    right coil-and only has function of left kidney   TOTAL ABDOMINAL HYSTERECTOMY     X-STOP IMPLANTATION      OB History     Gravida  1   Para  1   Term      Preterm  1   AB      Living         SAB      IAB      Ectopic      Multiple      Live Births               Home Medications    Prior to Admission medications   Medication Sig Start Date End Date Taking?  Authorizing Provider  tiZANidine  (ZANAFLEX ) 4 MG tablet Take 1 tablet (4 mg total) by mouth every 8 (eight) hours as needed for muscle spasms. 02/02/24  Yes Leath-Warren, Belen Bowers, NP  albuterol  (VENTOLIN  HFA) 108 (90 Base) MCG/ACT inhaler Inhale 1-2 puffs into the lungs every 6 (six) hours as needed for wheezing or shortness of breath. 12/21/23   Zarwolo, Gloria, FNP  allopurinol  (ZYLOPRIM ) 100 MG tablet Take 2 tablets by mouth once daily 12/19/23   Patel, Rutwik K, MD  atenolol  (TENORMIN ) 50 MG tablet Take 1 tablet (50 mg total) by mouth daily. 08/29/23   Meldon Sport, MD  benzonatate  (TESSALON ) 100 MG capsule Take 1 capsule (100 mg total) by mouth 2 (two) times daily as needed for cough. 01/19/24   Starlene Eaton, FNP  butalbital -acetaminophen -caffeine  (FIORICET) 50-325-40 MG tablet Take 1 tablet by mouth every 12 (twelve) hours as needed for headache or migraine. 08/15/22   Meldon Sport, MD  CARTIA  XT 240 MG 24 hr capsule Take 1 capsule (240 mg total) by mouth daily. 08/29/23   Meldon Sport, MD  cetirizine  (ZYRTEC ) 10 MG tablet Take 1 tablet (10 mg total) by mouth daily. 11/24/23   Leath-Warren, Belen Bowers, NP  clopidogrel  (PLAVIX ) 75 MG tablet Take 1 tablet (75 mg total) by mouth every evening. 08/29/23   Meldon Sport, MD  dexlansoprazole  (DEXILANT ) 60 MG capsule Take 1 capsule by mouth once daily 12/21/23   Patel, Rutwik K, MD  dicyclomine  (BENTYL ) 10 MG capsule Take 1 capsule (10 mg total) by mouth 3 (three) times daily as needed for spasms. 07/06/23   Meldon Sport, MD  fluticasone -salmeterol (ADVAIR) 100-50 MCG/ACT AEPB Inhale 1 puff into the lungs 2 (two) times daily. 01/04/24   Meldon Sport, MD  guaiFENesin  (MUCINEX ) 600 MG 12 hr tablet Take 1 tablet (600 mg total) by mouth 2 (two) times daily. 01/19/24   Starlene Eaton, FNP  hydrochlorothiazide  (HYDRODIURIL ) 25 MG tablet Take 1 tablet (25 mg total) by mouth daily. 08/29/23   Meldon Sport, MD  hydrOXYzine  (ATARAX )  10 MG tablet Take 1 tablet (10 mg total) by mouth every 8 (eight) hours as needed for anxiety. 12/18/23   Wilhemena Harbour, NP  methocarbamol  (ROBAXIN ) 500 MG tablet Take 1 tablet (500 mg total) by mouth every 8 (eight) hours as needed for muscle spasms. 09/22/23   Magnant, Justice Olp, PA-C  montelukast  (SINGULAIR ) 10 MG tablet Take 1 tablet (10 mg total) by mouth at bedtime. 07/06/23   Meldon Sport, MD  ondansetron  (ZOFRAN -ODT) 4 MG disintegrating tablet Take 1 tablet (4 mg total) by mouth every  8 (eight) hours as needed. 06/05/23   Leath-Warren, Belen Bowers, NP  atorvastatin  (LIPITOR) 20 MG tablet Take 1 tablet (20 mg total) by mouth at bedtime. 11/25/19 02/18/20  Mathis Som, MD    Family History Family History  Problem Relation Age of Onset   Hypertension Mother    Heart disease Mother        Cardiac Arrest    Hypertension Father    Cancer Father    Hypertension Sister    Cancer Brother        Bladder Cancer    Hypertension Brother    Hypertension Sister     Social History Social History   Tobacco Use   Smoking status: Former    Current packs/day: 0.00    Average packs/day: 0.3 packs/day for 20.0 years (5.0 ttl pk-yrs)    Types: Cigarettes    Start date: 1999    Quit date: 2019    Years since quitting: 6.3   Smokeless tobacco: Never  Vaping Use   Vaping status: Never Used  Substance Use Topics   Alcohol use: Not Currently    Comment: occasionally   Drug use: No     Allergies   Amoxil  [amoxicillin ], Bee venom, Nsaids, and Shellfish allergy   Review of Systems Review of Systems Per HPI  Physical Exam Triage Vital Signs ED Triage Vitals  Encounter Vitals Group     BP 02/02/24 1541 (!) 150/76     Systolic BP Percentile --      Diastolic BP Percentile --      Pulse Rate 02/02/24 1539 (!) 47     Resp 02/02/24 1539 16     Temp 02/02/24 1539 98.2 F (36.8 C)     Temp Source 02/02/24 1539 Oral     SpO2 02/02/24 1539 97 %     Weight --      Height --       Head Circumference --      Peak Flow --      Pain Score 02/02/24 1541 8     Pain Loc --      Pain Education --      Exclude from Growth Chart --    No data found.  Updated Vital Signs BP (!) 150/76   Pulse (!) 47   Temp 98.2 F (36.8 C) (Oral)   Resp 16   SpO2 97%   Visual Acuity Right Eye Distance:   Left Eye Distance:   Bilateral Distance:    Right Eye Near:   Left Eye Near:    Bilateral Near:     Physical Exam Vitals and nursing note reviewed.  Constitutional:      General: She is not in acute distress.    Appearance: Normal appearance.  HENT:     Head: Normocephalic.  Eyes:     Extraocular Movements: Extraocular movements intact.     Pupils: Pupils are equal, round, and reactive to light.  Cardiovascular:     Rate and Rhythm: Regular rhythm. Bradycardia present.     Pulses: Normal pulses.     Heart sounds: Normal heart sounds.  Pulmonary:     Effort: Pulmonary effort is normal. No respiratory distress.     Breath sounds: Normal breath sounds. No stridor. No wheezing, rhonchi or rales.  Abdominal:     General: Bowel sounds are normal.     Palpations: Abdomen is soft.     Tenderness: There is no abdominal tenderness.  Musculoskeletal:  Cervical back: Normal range of motion.     Left upper leg: Tenderness present. No swelling.     Comments: No swelling, bruising, obvious deformity or redness to left thigh.lateral aspect of left thigh tender to palpation; no obvious deformities palpated.  FROM to bilateral lower extremities.  Neurovascularly intact in bilateral lower extremities.    Skin:    General: Skin is warm and dry.  Neurological:     General: No focal deficit present.     Mental Status: She is alert and oriented to person, place, and time.  Psychiatric:        Mood and Affect: Mood normal.        Behavior: Behavior normal.      UC Treatments / Results  Labs (all labs ordered are listed, but only abnormal results are displayed) Labs  Reviewed - No data to display  EKG   Radiology No results found.  Procedures Procedures (including critical care time)  Medications Ordered in UC Medications - No data to display  Initial Impression / Assessment and Plan / UC Course  I have reviewed the triage vital signs and the nursing notes.  Pertinent labs & imaging results that were available during my care of the patient were reviewed by me and considered in my medical decision making (see chart for details).  Patient with ongoing left thigh pain.  This is a chronic condition for the patient.  She is scheduled to see vascular next month.  In the interim, will provide prescription for tizanidine  4 mg.  Supportive care recommendations were provided and discussed with the patient to include continuing over-the-counter analgesics, staying active, and the use of ice or heat.  Patient advised to follow-up with PCP if symptoms worsen prior to seeing vascular.  Patient was in agreement with this plan of care and verbalizes understanding.  All questions were answered.  Patient stable for discharge.  Final Clinical Impressions(s) / UC Diagnoses   Final diagnoses:  Left thigh pain  Chronic pain of left lower extremity     Discharge Instructions      Take medication as prescribed.  Please be advised the medication may cause drowsiness.  No driving, operating heavy equipment, or drinking alcohol when taking the medication. May take over-the-counter Tylenol  as needed for pain or discomfort. Recommend the use of ice or heat.  Apply ice for pain or swelling, heat for spasm or stiffness.  Apply for 20 minutes, remove for 1 hour, repeat as needed. Try to remain active as much as possible. Follow-up with vascular as scheduled.  Follow-up with PCP if symptoms worsen prior to seeing vascular. Follow-up as needed.     ED Prescriptions     Medication Sig Dispense Auth. Provider   tiZANidine  (ZANAFLEX ) 4 MG tablet Take 1 tablet (4 mg  total) by mouth every 8 (eight) hours as needed for muscle spasms. 30 tablet Leath-Warren, Belen Bowers, NP      PDMP not reviewed this encounter.   Hardy Lia, NP 02/02/24 1604    Hardy Lia, NP 02/02/24 1605

## 2024-02-02 NOTE — ED Triage Notes (Signed)
 Pt states left leg pain for the past month. States she was seen here for the same and was put on Tizanidine  which helped but she is out.  States she has an appointment with a vascular doctor the end of next month.

## 2024-02-02 NOTE — Discharge Instructions (Addendum)
 Take medication as prescribed.  Please be advised the medication may cause drowsiness.  No driving, operating heavy equipment, or drinking alcohol when taking the medication. May take over-the-counter Tylenol  as needed for pain or discomfort. Recommend the use of ice or heat.  Apply ice for pain or swelling, heat for spasm or stiffness.  Apply for 20 minutes, remove for 1 hour, repeat as needed. Try to remain active as much as possible. Follow-up with vascular as scheduled.  Follow-up with PCP if symptoms worsen prior to seeing vascular. Follow-up as needed.

## 2024-02-07 ENCOUNTER — Telehealth: Payer: Self-pay | Admitting: Surgical

## 2024-02-07 ENCOUNTER — Emergency Department (HOSPITAL_COMMUNITY)

## 2024-02-07 ENCOUNTER — Emergency Department (HOSPITAL_COMMUNITY)
Admission: EM | Admit: 2024-02-07 | Discharge: 2024-02-07 | Disposition: A | Discharge status: Home / Self Care | Attending: Emergency Medicine | Admitting: Emergency Medicine

## 2024-02-07 ENCOUNTER — Encounter (HOSPITAL_COMMUNITY): Payer: Self-pay

## 2024-02-07 ENCOUNTER — Other Ambulatory Visit: Payer: Self-pay

## 2024-02-07 ENCOUNTER — Other Ambulatory Visit: Payer: Self-pay | Admitting: Surgical

## 2024-02-07 DIAGNOSIS — M79605 Pain in left leg: Secondary | ICD-10-CM | POA: Diagnosis not present

## 2024-02-07 DIAGNOSIS — R202 Paresthesia of skin: Secondary | ICD-10-CM | POA: Diagnosis not present

## 2024-02-07 DIAGNOSIS — M79652 Pain in left thigh: Secondary | ICD-10-CM | POA: Insufficient documentation

## 2024-02-07 DIAGNOSIS — M25552 Pain in left hip: Secondary | ICD-10-CM | POA: Insufficient documentation

## 2024-02-07 DIAGNOSIS — J45909 Unspecified asthma, uncomplicated: Secondary | ICD-10-CM | POA: Diagnosis not present

## 2024-02-07 DIAGNOSIS — N189 Chronic kidney disease, unspecified: Secondary | ICD-10-CM | POA: Insufficient documentation

## 2024-02-07 DIAGNOSIS — I129 Hypertensive chronic kidney disease with stage 1 through stage 4 chronic kidney disease, or unspecified chronic kidney disease: Secondary | ICD-10-CM | POA: Insufficient documentation

## 2024-02-07 LAB — BASIC METABOLIC PANEL WITH GFR
Anion gap: 10 (ref 5–15)
BUN: 25 mg/dL — ABNORMAL HIGH (ref 8–23)
CO2: 24 mmol/L (ref 22–32)
Calcium: 10.2 mg/dL (ref 8.9–10.3)
Chloride: 104 mmol/L (ref 98–111)
Creatinine, Ser: 1.49 mg/dL — ABNORMAL HIGH (ref 0.44–1.00)
GFR, Estimated: 39 mL/min — ABNORMAL LOW (ref >60)
Glucose, Bld: 98 mg/dL (ref 70–99)
Potassium: 3.9 mmol/L (ref 3.5–5.1)
Sodium: 138 mmol/L (ref 135–145)

## 2024-02-07 LAB — MAGNESIUM: Magnesium: 2.1 mg/dL (ref 1.7–2.4)

## 2024-02-07 MED ORDER — PREDNISONE 10 MG (21) PO TBPK: ORAL_TABLET | ORAL | 0 refills | Status: DC

## 2024-02-07 MED ORDER — TRAMADOL HCL 50 MG PO TABS: 50.0000 mg | ORAL_TABLET | Freq: Four times a day (QID) | ORAL | 0 refills | Status: DC | PRN

## 2024-02-07 NOTE — Discharge Instructions (Addendum)
 Your labs and your ultrasound today are reassuringly normal, there is no evidence for a DVT on today's exam or ultrasound.  You may continue using the muscle relaxer you are prescribed by your primary provider.  I also suggest applying a heating pad to this area of pain for 15 to 20 minutes several times daily, this can help relieve muscle spasm and help heal muscle soreness.  Plan follow-up care with your orthopedist if your symptoms persist or worsen.  I have prescribed you a small quantity of pain medication for you to try, do not drive within 4 hours of taking this medication as it will make you drowsy.

## 2024-02-07 NOTE — ED Provider Notes (Signed)
 [place] [facility] AT [facility] Provider Note   CSN: [id number] Arrival date & time: 02/07/24  1145     History  Chief Complaint  Patient presents with   Leg Pain    Jaclyn Brooks is a 63 y.o. 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  She is under the care of [facility], had a steroid injection in her left hip in March which was equivocal for improving pain.  She was also placed on a muscle relaxer at that time as she has spasms in her thigh, sometimes her calf, reporting increased spasms recently, after the last significant spasm she has had persistent pain and swelling in her left medial thigh.  She denies palpable nodules, also denies chest pain, shortness of breath.  She has been compliant with her Plavix .  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  Her muscle relaxer has not been helpful.  She finds she can tolerate 2 shifts in a row, by the third shift she is unable to perform her duties.  She has had no swelling in her lower extremity, denies weakness or numbness in the leg.  She also denies low back pain.  Fevers or chills, no urinary fecal incontinence or retention.  The history is provided by the patient.       Home Medications Prior to Admission medications   Medication Sig Start Date End Date Taking? Authorizing Provider  traMADol  (ULTRAM ) 50 MG tablet Take 1 tablet (50 mg total) by mouth every 6 (six) hours as needed. 02/07/24  Yes Endya Austin, PA-C  albuterol  (VENTOLIN  HFA) 108 (90 Base) MCG/ACT inhaler Inhale 1-2 puffs into the lungs every 6 (six) hours as needed for  wheezing or shortness of breath. 12/21/23   Zarwolo, Gloria, FNP  allopurinol  (ZYLOPRIM ) 100 MG tablet Take 2 tablets by mouth once daily 12/19/23   Patel, Rutwik K, MD  atenolol  (TENORMIN ) 50 MG tablet Take 1 tablet (50 mg total) by mouth daily. 08/29/23   Meldon Sport, MD  benzonatate  (TESSALON ) 100 MG capsule Take 1 capsule (100 mg total) by mouth 2 (two) times daily as needed for cough. 01/19/24   Starlene Eaton, FNP  butalbital -acetaminophen -caffeine  (FIORICET) 50-325-40 MG tablet Take 1 tablet by mouth every 12 (twelve) hours as needed for headache or migraine. 08/15/22   Meldon Sport, MD  CARTIA  XT 240 MG 24 hr capsule Take 1 capsule (240 mg total) by mouth daily. 08/29/23   Meldon Sport, MD  cetirizine  (ZYRTEC ) 10 MG tablet Take 1 tablet (10 mg total) by mouth daily. 11/24/23   Leath-Warren, Belen Bowers, NP  clopidogrel  (PLAVIX ) 75 MG tablet Take 1 tablet (75 mg total) by mouth every evening. 08/29/23   Meldon Sport, MD  dexlansoprazole  (DEXILANT ) 60 MG capsule Take 1 capsule by mouth once daily 12/21/23   Patel, Rutwik K, MD  dicyclomine  (BENTYL ) 10 MG capsule Take 1 capsule (10 mg total) by mouth 3 (three) times daily as needed for spasms. 07/06/23   Meldon Sport, MD  fluticasone -salmeterol (ADVAIR) 100-50 MCG/ACT AEPB Inhale 1 puff into the lungs 2 (two)  times daily. 01/04/24   Meldon Sport, MD  guaiFENesin  (MUCINEX ) 600 MG 12 hr tablet Take 1 tablet (600 mg total) by mouth 2 (two) times daily. 01/19/24   Starlene Eaton, FNP  hydrochlorothiazide  (HYDRODIURIL ) 25 MG tablet Take 1 tablet (25 mg total) by mouth daily. 08/29/23   Meldon Sport, MD  hydrOXYzine  (ATARAX ) 10 MG tablet Take 1 tablet (10 mg total) by mouth every 8 (eight) hours as needed for anxiety. 12/18/23   Wilhemena Harbour, NP  methocarbamol  (ROBAXIN ) 500 MG tablet Take 1 tablet (500 mg total) by mouth every 8 (eight) hours as needed for muscle spasms. 09/22/23   Magnant, Justice Olp, PA-C  montelukast   (SINGULAIR ) 10 MG tablet Take 1 tablet (10 mg total) by mouth at bedtime. 07/06/23   Meldon Sport, MD  ondansetron  (ZOFRAN -ODT) 4 MG disintegrating tablet Take 1 tablet (4 mg total) by mouth every 8 (eight) hours as needed. 06/05/23   Leath-Warren, Belen Bowers, NP  tiZANidine  (ZANAFLEX ) 4 MG tablet Take 1 tablet (4 mg total) by mouth every 8 (eight) hours as needed for muscle spasms. 02/02/24   Leath-Warren, Belen Bowers, NP  atorvastatin  (LIPITOR) 20 MG tablet Take 1 tablet (20 mg total) by mouth at bedtime. 11/25/19 02/18/20  Mathis Som, MD      Allergies    Amoxil  [amoxicillin ], Bee venom, Nsaids, and Shellfish allergy    Review of Systems   Review of Systems  Constitutional:  Negative for fever.  HENT:  Negative for congestion and sore throat.   Eyes: Negative.   Respiratory:  Negative for chest tightness and shortness of breath.   Cardiovascular:  Negative for chest pain.  Gastrointestinal:  Negative for abdominal pain and nausea.  Genitourinary: Negative.   Musculoskeletal:  Positive for arthralgias and myalgias. Negative for back pain, neck pain and neck stiffness.  Skin: Negative.  Negative for rash and wound.  Neurological:  Negative for dizziness, weakness, light-headedness, numbness and headaches.  Psychiatric/Behavioral: Negative.    All other systems reviewed and are negative.   Physical Exam Updated Vital Signs BP (!) 150/70   Pulse (!) 42   Temp 97.6 F (36.4 C) (Oral)   Resp 18   Ht 5\' 6"  (1.676 m)   Wt 79.9 kg   SpO2 100%   BMI 28.43 kg/m  Physical Exam Constitutional:      Appearance: She is well-developed.  HENT:     Head: Atraumatic.  Cardiovascular:     Comments: Pulses equal bilaterally Musculoskeletal:        General: Tenderness present.     Cervical back: Normal range of motion.     Left upper leg: Swelling and tenderness present. No deformity.     Comments: Tender to palpation left mid to upper medial thigh.  Subtle increased swelling in  comparison to the right thigh.  There is no redness, no palpable cords or nodules.  No rash.  Skin:    General: Skin is warm and dry.  Neurological:     Mental Status: She is alert.     Sensory: No sensory deficit.     Motor: No weakness.     Deep Tendon Reflexes: Reflexes normal.     ED Results / Procedures / Treatments   Labs (all labs ordered are listed, but only abnormal results are displayed) Labs Reviewed  BASIC METABOLIC PANEL WITH GFR - Abnormal; Notable for the following components:      Result Value   BUN 25 (*)  Creatinine, Ser 1.49 (*)    GFR, Estimated 39 (*)    All other components within normal limits  MAGNESIUM    EKG None  Radiology US  Venous Img Lower Unilateral Left (DVT) Result Date: 02/07/2024 CLINICAL DATA:  Pain, burning, tingling x1 month EXAM: LEFT LOWER EXTREMITY VENOUS DOPPLER ULTRASOUND TECHNIQUE: Gray-scale sonography with compression, as well as color and duplex ultrasound, were performed to evaluate the deep venous system(s) from the level of the common femoral vein through the popliteal and proximal calf veins. COMPARISON:  None Available. FINDINGS: VENOUS Normal compressibility of the common femoral, superficial femoral, and popliteal veins, as well as the visualized calf veins. Visualized portions of profunda femoral vein and great saphenous vein unremarkable. No filling defects to suggest DVT on grayscale or color Doppler imaging. Doppler waveforms show normal direction of venous flow, normal respiratory plasticity and response to augmentation. OTHER None. Limitations: none IMPRESSION: Negative. Electronically Signed   By: Nicoletta Barrier M.D.   On: 02/07/2024 14:31    Procedures Procedures    Medications Ordered in ED Medications - No data to display  ED Course/ Medical Decision Making/ A&P                                 Medical Decision Making Patient presenting with left leg pain, localizing to the left medial upper thigh, she has known  left hip osteoarthritis under the care of of [facility].  Last imaging was reviewed, completed last summer, minimal arthritis noted in this joint.  No new injuries or falls, no indication for repeat imaging at this time regarding the joint.  With the soft tissue edema an ultrasound was completed to rule out DVT and is negative.  She was encouraged to continue with her muscle relaxer, prescribed a few tramadol  tablets, discussed role of heat, activity as tolerated.  Follow-up with her orthopedic specialist for follow-up care.  Amount and/or Complexity of Data Reviewed Labs: ordered.    Details: Labs reviewed with normal electrolytes, no obvious electrolyte disturbances source of muscle spasms.  Her creatinine is 1.49 which is stable for her. Radiology: ordered.    Details: Venous ultrasound left lower extremity negative for DVT.  Risk Prescription drug management.           Final Clinical Impression(s) / ED Diagnoses Final diagnoses:  Left leg pain    Rx / DC Orders ED Discharge Orders          Ordered    traMADol  (ULTRAM ) 50 MG tablet  Every 6 hours PRN        02/07/24 1518              Bracen Schum, PA-C 02/07/24 1521    Ninetta Basket, MD 02/07/24 [phone]

## 2024-02-07 NOTE — Telephone Encounter (Signed)
 Yeah you can schedule her for return office visit to evaluate for possible referred pain from her low back potentially

## 2024-02-07 NOTE — ED Notes (Signed)
 Patient has discharge orders, discharge teaching given and no further questions at this time.

## 2024-02-07 NOTE — ED Triage Notes (Signed)
 Pt arrived via POV c/o left leg, hip pain. Pt reports having vascular appointment next month. Pt reports Hx of steroids and Zanaflex  for her leg pain w/o relief. Pt reports she does a lot of walking at work.

## 2024-02-07 NOTE — Telephone Encounter (Signed)
 Spoke with [employer]. He sent in prednisone  for patient. Worked in to schedule on 02/10/2024 at 12:30p.

## 2024-02-07 NOTE — Telephone Encounter (Signed)
 Patient called and said she is very bad pain from her lower leg and its going  down to her foot. She can't even get no sleep. CB#[phone]

## 2024-02-07 NOTE — Telephone Encounter (Signed)
 Please advise. Patient had hip injection on 12/20/2023.  She was also seen at Urgent Care on 02/02/2024 and given refill of Tizanidine . She has appt with Vascular later in May.  Would you like for me to schedule ROV?

## 2024-02-09 ENCOUNTER — Encounter: Payer: Self-pay | Admitting: Surgical

## 2024-02-09 ENCOUNTER — Other Ambulatory Visit (INDEPENDENT_AMBULATORY_CARE_PROVIDER_SITE_OTHER)

## 2024-02-09 ENCOUNTER — Ambulatory Visit: Admitting: Surgical

## 2024-02-09 DIAGNOSIS — M79605 Pain in left leg: Secondary | ICD-10-CM

## 2024-02-09 DIAGNOSIS — M5416 Radiculopathy, lumbar region: Secondary | ICD-10-CM

## 2024-02-11 ENCOUNTER — Encounter: Payer: Self-pay | Admitting: Surgical

## 2024-02-11 NOTE — Progress Notes (Signed)
 Office Visit Note   Patient: Jaclyn Brooks           Date of Birth: 31-Aug-1961           MRN: [id number] Visit Date: 02/09/2024 Requested by: Meldon Sport, MD [address] [place],  [place] [address] PCP: Meldon Sport, MD  Subjective: Chief Complaint  Patient presents with   Left Leg - Pain    HPI: Jaclyn Brooks is a 63 y.o. female who presents to the office reporting left leg pain.  Patient has been experiencing symptoms worsening over the last 2 months.  She describes pain that radiates down the left leg to her toes with cramping in the left leg.  A lot of her pain is in the anterolateral hip with radiation into the thigh but she does have radicular pain down to the toes with numbness and tingling.  This pain bothers her especially at night.  She was prescribed steroid Dosepak yesterday and started that.  No improvement yet.  Has tried muscle relaxer without relief.  Taking Tylenol  as well.  The radicular pain extends down to the plantar foot.  She has pins and needle sensation in her foot.  Mild low back pain is present.  Feels the leg will give out on her.  No right-sided symptoms..                ROS: All systems reviewed are negative as they relate to the chief complaint within the history of present illness.  Patient denies fevers or chills.  Assessment & Plan: Visit Diagnoses:  1. Pain in left leg   2. Lumbar radiculopathy     Plan: Patient is a 63 year old female who presents for evaluation of left leg radiating pain.  She has history of left hip arthritis that has responded to cortisone injections in the past but the last injection did not help as much as it has previously.  Over the last 2 months she has now noticed more and more radicular pain down the left leg that extends outside the distribution of her typical left hip arthritis pain.  Pain radiates all the way down to the plantar foot.  She has radiographs of the lumbar spine taken today demonstrating  spondylolisthesis at L4-L5 and L5-S1 with facet arthritis noted at multiple levels.  With her radicular pain and radiographic findings, need advanced imaging to elucidate any stenosis that may be contributing to her pain.  She works a very physical job so we will plan to keep her out of work for the next month as we get this figured out.  Ordered CT myelogram of the lumbar spine to evaluate left-sided radiculopathy.  Follow-Up Instructions: No follow-ups on file.   Orders:  Orders Placed This Encounter  Procedures   XR Lumbar Spine 2-3 Views   DG MYELOGRAPHY LUMBAR INJ LUMBOSACRAL   CT LUMBAR SPINE W CONTRAST   No orders of the defined types were placed in this encounter.     Procedures: No procedures performed   Clinical Data: No additional findings.  Objective: Vital Signs: There were no vitals taken for this visit.  Physical Exam:  Constitutional: Patient appears well-developed HEENT:  Head: Normocephalic Eyes:EOM are normal Neck: Normal range of motion Cardiovascular: Normal rate Pulmonary/chest: Effort normal Neurologic: Patient is alert Skin: Skin is warm Psychiatric: Patient has normal mood and affect  Ortho Exam: Ortho exam demonstrates positive straight leg raise on left, negative on right.  She has positive FADIR  sign reproducing groin pain on the left but not right.  Intact hip flexion, quadricep, hamstring, dorsiflexion, plantarflexion, EHL rated 5/5 bilaterally.  No clonus noted bilaterally.  She has no tenderness over the greater trochanter.  No pain with logroll of the left hip.  Specialty Comments:  No specialty comments available.  Imaging: No results found.   PMFS History: Patient Active Problem List   Diagnosis Date Noted   Anxiety 12/21/2023   Flank pain 09/19/2023   RUQ pain 09/19/2023   Chronic sinusitis 08/29/2023   Enlarged skin mole 08/29/2023   Dysphagia 07/23/2023   Reactive airway disease 07/06/2023   Loose bowel movements 07/06/2023    Primary osteoarthritis of left hip 05/05/2023   Dysuria 04/16/2023   Diarrhea of presumed infectious origin 02/01/2023   Intractable migraine with aura without status migrainosus 08/15/2022   Traumatic hematoma of right lower leg 07/13/2022   History of total abdominal hysterectomy 07/04/2022   GAD (generalized anxiety disorder) 04/28/2022   Caregiver stress 04/28/2022   Chronic left-sided low back pain without sciatica 04/13/2022   Contact dermatitis 04/13/2022   Primary osteoarthritis of right knee 03/09/2022   Refused influenza vaccine 12/02/2021   Encounter for general adult medical examination with abnormal findings 11/30/2021   Epidermal cyst 11/30/2021   Acute recurrent frontal sinusitis 09/21/2021   Allergic rhinitis 08/29/2021   Nausea 08/27/2021   Hemorrhoids 07/07/2021   Gout 03/25/2021   Type 2 diabetes mellitus with other specified complication (HCC) 07/14/2020   Primary osteoarthritis of right foot 05/12/2020   GERD (gastroesophageal reflux disease) 05/31/2019   Solitary kidney, acquired 05/10/2019   Carotid arterial disease (HCC) 12/11/2018   COPD, 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/01/2018   CKD (chronic kidney disease) stage 3, GFR 30-59 ml/min (HCC) 10/01/2018   Aortic valve sclerosis 10/01/2018   Hypertension 09/11/2018   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HCC)     Family History  Problem Relation Age of Onset   Hypertension Mother    Heart disease Mother        Cardiac Arrest    Hypertension Father    Cancer Father    Hypertension Sister    Cancer Brother        Bladder Cancer    Hypertension Brother    Hypertension Sister     Past Surgical History:  Procedure Laterality Date   CHOLECYSTECTOMY N/A 01/28/2015   Procedure: LAPAROSCOPIC CHOLECYSTECTOMY;  Surgeon: Alanda Allegra Md, MD;   Location: [facility];  Service: General;  Laterality: N/A;   excision of bone spurs Bilateral    Big toes   renal aneurysm Right    right coil-and only has function of left kidney   TOTAL ABDOMINAL HYSTERECTOMY     X-STOP IMPLANTATION     Social History   Occupational History   Occupation: Retired  Tobacco Use   Smoking status: Former    Current packs/day: 0.00    Average packs/day: 0.3 packs/day for 20.0 years (5.0 ttl pk-yrs)    Types: Cigarettes    Start date: 1999    Quit date: 2019    Years since quitting: 6.3   Smokeless tobacco: Never  Vaping Use   Vaping status: Never Used  Substance and Sexual Activity   Alcohol use: Not Currently  Comment: occasionally   Drug use: No   Sexual activity: Yes    Birth control/protection: Surgical

## 2024-02-14 ENCOUNTER — Telehealth: Payer: Self-pay | Admitting: Surgical

## 2024-02-14 NOTE — Telephone Encounter (Signed)
 Received call from patient asking if we have received forms. I advised pt that we have not. Advised pt to reach back out to her insurance co. And have them refax forms. Advised pt that she will need to pay $20 form fee and complete auth prior to completion of form. Patient voiced understanding.

## 2024-02-15 ENCOUNTER — Ambulatory Visit (INDEPENDENT_AMBULATORY_CARE_PROVIDER_SITE_OTHER)

## 2024-02-15 VITALS — Ht 66.0 in | Wt 176.0 lb

## 2024-02-15 DIAGNOSIS — Z0000 Encounter for general adult medical examination without abnormal findings: Secondary | ICD-10-CM

## 2024-02-15 DIAGNOSIS — Z5320 Procedure and treatment not carried out because of patient's decision for unspecified reasons: Secondary | ICD-10-CM

## 2024-02-15 NOTE — Patient Instructions (Signed)
 [name] , Thank you for taking time out of your busy schedule to complete your Annual Wellness Visit with me. I enjoyed our conversation and look forward to speaking with you again next year. I, as well as your care team,  appreciate your ongoing commitment to your health goals. Please review the following plan we discussed and let me know if I can assist you in the future. Your Game plan/ To Do List    Referrals: If you haven't heard from the office you've been referred to, please reach out to them at the phone number provided.  N/A. You are due for a screening colonoscopy. Please consider having this done Follow up Visits: Next Medicare AWV with our clinical staff: Feb 18, 2025 at 10:00 video visit   Have you seen your provider in the last 6 months (3 months if uncontrolled diabetes)? Yes Next Office Visit with your provider: March 21, 2024 at 4:20 pm IN PERSON VISIT  Clinician Recommendations:  Aim for 30 minutes of exercise or brisk walking, 6-8 glasses of water, and 5 servings of fruits and vegetables each day.   Wishing you a speedy recovery and hoping you feel better soon!!         This is a list of the screening recommended for you and due dates:  Health Maintenance  Topic Date Due   DTaP/Tdap/Td vaccine (1 - Tdap) Never done   Pneumococcal Vaccination (1 of 2 - PCV) Never done   Zoster (Shingles) Vaccine (1 of 2) Never done   Colon Cancer Screening  Never done   Eye exam for diabetics  04/20/2022   Mammogram  12/06/2023   Flu Shot  05/10/2024   Complete foot exam   07/05/2024   Hemoglobin A1C  07/06/2024   Yearly kidney health urinalysis for diabetes  08/28/2024   Yearly kidney function blood test for diabetes  02/06/2025   Medicare Annual Wellness Visit  02/14/2025   Hepatitis C Screening  Completed   HIV Screening  Completed   HPV Vaccine  Aged Out   Meningitis B Vaccine  Aged Out   COVID-19 Vaccine  Discontinued    Advanced directives: (Declined) Advance  directive discussed with you today. Even though you declined this today, please call our office should you change your mind, and we can give you the proper paperwork for you to fill out. Advance Care Planning is important because it:  [x]  Makes sure you receive the medical care that is consistent with your values, goals, and preferences  [x]  It provides guidance to your family and loved ones and reduces their decisional burden about whether or not they are making the right decisions based on your wishes.  Follow the link provided in your after visit summary or read over the paperwork we have mailed to you to help you started getting your Advance Directives in place. If you need assistance in completing these, please reach out to us  so that we can help you!  See attachments for Preventive Care and Fall Prevention Tips.   Preventive Care 6-68 Years Old, Female Preventive care refers to lifestyle choices and visits with your health care provider that can promote health and wellness. Preventive care visits are also called wellness exams. What can I expect for my preventive care visit? Counseling Your health care provider may ask you questions about your: Medical history, including: Past medical problems. Family medical history. Pregnancy history. Current health, including: Menstrual cycle. Method of birth control. Emotional well-being. Home life and  relationship well-being. Sexual activity and sexual health. Lifestyle, including: Alcohol, nicotine or tobacco, and drug use. Access to firearms. Diet, exercise, and sleep habits. Work and work Astronomer. Sunscreen use. Safety issues such as seatbelt and bike helmet use. Physical exam Your health care provider will check your: Height and weight. These may be used to calculate your BMI (body mass index). BMI is a measurement that tells if you are at a healthy weight. Waist circumference. This measures the distance around your waistline.  This measurement also tells if you are at a healthy weight and may help predict your risk of certain diseases, such as type 2 diabetes and high blood pressure. Heart rate and blood pressure. Body temperature. Skin for abnormal spots. What immunizations do I need?  Vaccines are usually given at various ages, according to a schedule. Your health care provider will recommend vaccines for you based on your age, medical history, and lifestyle or other factors, such as travel or where you work. What tests do I need? Screening Your health care provider may recommend screening tests for certain conditions. This may include: Lipid and cholesterol levels. Diabetes screening. This is done by checking your blood sugar (glucose) after you have not eaten for a while (fasting). Pelvic exam and Pap test. Hepatitis B test. Hepatitis C test. HIV (human immunodeficiency virus) test. STI (sexually transmitted infection) testing, if you are at risk. Lung cancer screening. Colorectal cancer screening. Mammogram. Talk with your health care provider about when you should start having regular mammograms. This may depend on whether you have a family history of breast cancer. BRCA-related cancer screening. This may be done if you have a family history of breast, ovarian, tubal, or peritoneal cancers. Bone density scan. This is done to screen for osteoporosis. Talk with your health care provider about your test results, treatment options, and if necessary, the need for more tests. Follow these instructions at home: Eating and drinking  Eat a diet that includes fresh fruits and vegetables, whole grains, lean protein, and low-fat dairy products. Take vitamin and mineral supplements as recommended by your health care provider. Do not drink alcohol if: Your health care provider tells you not to drink. You are pregnant, may be pregnant, or are planning to become pregnant. If you drink alcohol: Limit how much you have  to 0-1 drink a day. Know how much alcohol is in your drink. In the [place] one drink equals one 12 oz bottle of beer (355 mL), one 5 oz glass of wine (148 mL), or one 1 oz glass of hard liquor (44 mL). Lifestyle Brush your teeth every morning and night with fluoride toothpaste. Floss one time each day. Exercise for at least 30 minutes 5 or more days each week. Do not use any products that contain nicotine or tobacco. These products include cigarettes, chewing tobacco, and vaping devices, such as e-cigarettes. If you need help quitting, ask your health care provider. Do not use drugs. If you are sexually active, practice safe sex. Use a condom or other form of protection to prevent STIs. If you do not wish to become pregnant, use a form of birth control. If you plan to become pregnant, see your health care provider for a prepregnancy visit. Take aspirin only as told by your health care provider. Make sure that you understand how much to take and what form to take. Work with your health care provider to find out whether it is safe and beneficial for you to take aspirin daily. Find  healthy ways to manage stress, such as: Meditation, yoga, or listening to music. Journaling. Talking to a trusted person. Spending time with friends and family. Minimize exposure to UV radiation to reduce your risk of skin cancer. Safety Always wear your seat belt while driving or riding in a vehicle. Do not drive: If you have been drinking alcohol. Do not ride with someone who has been drinking. When you are tired or distracted. While texting. If you have been using any mind-altering substances or drugs. Wear a helmet and other protective equipment during sports activities. If you have firearms in your house, make sure you follow all gun safety procedures. Seek help if you have been physically or sexually abused. What's next? Visit your health care provider once a year for an annual wellness visit. Ask your health  care provider how often you should have your eyes and teeth checked. Stay up to date on all vaccines. This information is not intended to replace advice given to you by your health care provider. Make sure you discuss any questions you have with your health care provider. Document Revised: 03/24/2021 Document Reviewed: 03/24/2021 Elsevier Patient Education  2024 [employer]  Managing Pain Without Opioids  Opioids are strong medicines used to treat moderate to severe pain. For some people, especially those who have long-term (chronic) pain, opioids may not be the best choice for pain management due to: Side effects like nausea, constipation, and sleepiness. The risk of addiction (opioid use disorder). The longer you take opioids, the greater your risk of addiction. Pain that lasts for more than 3 months is called chronic pain. Managing chronic pain usually requires more than one approach and is often provided by a team of health care providers working together (multidisciplinary approach). Pain management may be done at a pain management center or pain clinic. How to manage pain without the use of opioids Use non-opioid medicines Non-opioid medicines for pain may include: Over-the-counter or prescription non-steroidal anti-inflammatory drugs (NSAIDs). These may be the first medicines used for pain. They work well for muscle and bone pain, and they reduce swelling. Acetaminophen . This over-the-counter medicine may work well for milder pain but not swelling. Antidepressants. These may be used to treat chronic pain. A certain type of antidepressant (tricyclics) is often used. These medicines are given in lower doses for pain than when used for depression. Anticonvulsants. These are usually used to treat seizures but may also reduce nerve (neuropathic) pain. Muscle relaxants. These relieve pain caused by sudden muscle tightening (spasms). You may also use a pain medicine that is applied to the skin  as a patch, cream, or gel (topical analgesic), such as a numbing medicine. These may cause fewer side effects than medicines taken by mouth. Do certain therapies as directed Some therapies can help with pain management. They include: Physical therapy. You will do exercises to gain strength and flexibility. A physical therapist may teach you exercises to move and stretch parts of your body that are weak, stiff, or painful. You can learn these exercises at physical therapy visits and practice them at home. Physical therapy may also involve: Massage. Heat wraps or applying heat or cold to affected areas. Electrical signals that interrupt pain signals (transcutaneous electrical nerve stimulation, TENS). Weak lasers that reduce pain and swelling (low-level laser therapy). Signals from your body that help you learn to regulate pain (biofeedback). Occupational therapy. This helps you to learn ways to function at home and work with less pain. Recreational therapy. This involves trying  new activities or hobbies, such as a physical activity or drawing. Mental health therapy, including: Cognitive behavioral therapy (CBT). This helps you learn coping skills for dealing with pain. Acceptance and commitment therapy (ACT) to change the way you think and react to pain. Relaxation therapies, including muscle relaxation exercises and mindfulness-based stress reduction. Pain management counseling. This may be individual, family, or group counseling.  Receive medical treatments Medical treatments for pain management include: Nerve block injections. These may include a pain blocker and anti-inflammatory medicines. You may have injections: Near the spine to relieve chronic back or neck pain. Into joints to relieve back or joint pain. Into nerve areas that supply a painful area to relieve body pain. Into muscles (trigger point injections) to relieve some painful muscle conditions. A medical device placed near your  spine to help block pain signals and relieve nerve pain or chronic back pain (spinal cord stimulation device). Acupuncture. Follow these instructions at home Medicines Take over-the-counter and prescription medicines only as told by your health care provider. If you are taking pain medicine, ask your health care providers about possible side effects to watch out for. Do not drive or use heavy machinery while taking prescription opioid pain medicine. Lifestyle  Do not use drugs or alcohol to reduce pain. If you drink alcohol, limit how much you have to: 0-1 drink a day for women who are not pregnant. 0-2 drinks a day for men. Know how much alcohol is in a drink. In the [place] one drink equals one 12 oz bottle of beer (355 mL), one 5 oz glass of wine (148 mL), or one 1 oz glass of hard liquor (44 mL). Do not use any products that contain nicotine or tobacco. These products include cigarettes, chewing tobacco, and vaping devices, such as e-cigarettes. If you need help quitting, ask your health care provider. Eat a healthy diet and maintain a healthy weight. Poor diet and excess weight may make pain worse. Eat foods that are high in fiber. These include fresh fruits and vegetables, whole grains, and beans. Limit foods that are high in fat and processed sugars, such as fried and sweet foods. Exercise regularly. Exercise lowers stress and may help relieve pain. Ask your health care provider what activities and exercises are safe for you. If your health care provider approves, join an exercise class that combines movement and stress reduction. Examples include yoga and tai chi. Get enough sleep. Lack of sleep may make pain worse. Lower stress as much as possible. Practice stress reduction techniques as told by your therapist. General instructions Work with all your pain management providers to find the treatments that work best for you. You are an important member of your pain management team. There  are many things you can do to reduce pain on your own. Consider joining an online or in-person support group for people who have chronic pain. Keep all follow-up visits. This is important. Where to find more information You can find more information about managing pain without opioids from: American Academy of Pain Medicine: painmed.org Institute for Chronic Pain: instituteforchronicpain.org American Chronic Pain Association: theacpa.org Contact a health care provider if: You have side effects from pain medicine. Your pain gets worse or does not get better with treatments or home therapy. You are struggling with anxiety or depression. Summary Many types of pain can be managed without opioids. Chronic pain may respond better to pain management without opioids. Pain is best managed when you and a team of health care  providers work together. Pain management without opioids may include non-opioid medicines, medical treatments, physical therapy, mental health therapy, and lifestyle changes. Tell your health care providers if your pain gets worse or is not being managed well enough. This information is not intended to replace advice given to you by your health care provider. Make sure you discuss any questions you have with your health care provider. Document Revised: 01/06/2021 Document Reviewed: 01/06/2021 Elsevier Patient Education  2023 [employer].

## 2024-02-15 NOTE — Progress Notes (Signed)
 Subjective:   Jaclyn Brooks is a 63 y.o. who presents for a Medicare Wellness preventive visit.  Visit Complete: Virtual I connected with  Jaclyn Brooks on 02/15/24 by a audio enabled telemedicine application and verified that I am speaking with the correct person using two identifiers.  Patient Location: Home  Provider Location: Home Office  I discussed the limitations of evaluation and management by telemedicine. The patient expressed understanding and agreed to proceed.  Vital Signs: Because this visit was a virtual/telehealth visit, some criteria may be missing or patient reported. Any vitals not documented were not able to be obtained and vitals that have been documented are patient reported.  VideoDeclined- This patient declined Librarian, academic. Therefore the visit was completed with audio only.  Persons Participating in Visit: Patient.  AWV Questionnaire: No: Patient Medicare AWV questionnaire was not completed prior to this visit.  Cardiac Risk Factors include: advanced age (>93men, >58 women);dyslipidemia;hypertension;sedentary lifestyle     Objective:     Today's Vitals   02/15/24 1304 02/15/24 1308  Weight: 176 lb (79.8 kg)   Height: 5\' 6"  (1.676 m)   PainSc:  9   Telehealth visit. Unable to obtain blood pressure Body mass index is 28.41 kg/m.     02/15/2024    1:18 PM 02/07/2024   12:05 PM 02/06/2023   10:08 AM 05/19/2022   10:55 PM 10/19/2021    1:50 PM 06/24/2021   11:31 AM 03/01/2021   10:37 AM  Advanced Directives  Does Patient Have a Medical Advance Directive? No No No No No No No  Would patient like information on creating a medical advance directive? No - Patient declined No - Patient declined Yes (ED - Information included in AVS) No - Patient declined Yes (ED - Information included in AVS)      Current Medications (verified) Outpatient Encounter Medications as of 02/15/2024  Medication Sig   albuterol  (VENTOLIN  HFA) 108  (90 Base) MCG/ACT inhaler Inhale 1-2 puffs into the lungs every 6 (six) hours as needed for wheezing or shortness of breath.   allopurinol  (ZYLOPRIM ) 100 MG tablet Take 2 tablets by mouth once daily   atenolol  (TENORMIN ) 50 MG tablet Take 1 tablet (50 mg total) by mouth daily.   benzonatate  (TESSALON ) 100 MG capsule Take 1 capsule (100 mg total) by mouth 2 (two) times daily as needed for cough.   butalbital -acetaminophen -caffeine  (FIORICET) 50-325-40 MG tablet Take 1 tablet by mouth every 12 (twelve) hours as needed for headache or migraine.   CARTIA  XT 240 MG 24 hr capsule Take 1 capsule (240 mg total) by mouth daily.   cetirizine  (ZYRTEC ) 10 MG tablet Take 1 tablet (10 mg total) by mouth daily.   clopidogrel  (PLAVIX ) 75 MG tablet Take 1 tablet (75 mg total) by mouth every evening.   dexlansoprazole  (DEXILANT ) 60 MG capsule Take 1 capsule by mouth once daily   dicyclomine  (BENTYL ) 10 MG capsule Take 1 capsule (10 mg total) by mouth 3 (three) times daily as needed for spasms.   fluticasone -salmeterol (ADVAIR) 100-50 MCG/ACT AEPB Inhale 1 puff into the lungs 2 (two) times daily.   guaiFENesin  (MUCINEX ) 600 MG 12 hr tablet Take 1 tablet (600 mg total) by mouth 2 (two) times daily.   hydrochlorothiazide  (HYDRODIURIL ) 25 MG tablet Take 1 tablet (25 mg total) by mouth daily.   hydrOXYzine  (ATARAX ) 10 MG tablet Take 1 tablet (10 mg total) by mouth every 8 (eight) hours as needed for anxiety.  methocarbamol  (ROBAXIN ) 500 MG tablet Take 1 tablet (500 mg total) by mouth every 8 (eight) hours as needed for muscle spasms.   montelukast  (SINGULAIR ) 10 MG tablet Take 1 tablet (10 mg total) by mouth at bedtime.   ondansetron  (ZOFRAN -ODT) 4 MG disintegrating tablet Take 1 tablet (4 mg total) by mouth every 8 (eight) hours as needed.   predniSONE  (STERAPRED UNI-PAK 21 TAB) 10 MG (21) TBPK tablet Take as directed on package   tiZANidine  (ZANAFLEX ) 4 MG tablet Take 1 tablet (4 mg total) by mouth every 8 (eight)  hours as needed for muscle spasms.   traMADol  (ULTRAM ) 50 MG tablet Take 1 tablet (50 mg total) by mouth every 6 (six) hours as needed.   [DISCONTINUED] atorvastatin  (LIPITOR) 20 MG tablet Take 1 tablet (20 mg total) by mouth at bedtime.   No facility-administered encounter medications on file as of 02/15/2024.    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[facility])    Past Surgical History:  Procedure Laterality Date   CHOLECYSTECTOMY N/A 01/28/2015   Procedure: LAPAROSCOPIC CHOLECYSTECTOMY;  Surgeon: Alanda Allegra Md, MD;  Location: [facility];  Service: General;  Laterality: N/A;   excision of bone spurs Bilateral    Big toes   renal aneurysm Right    right coil-and only has function of left kidney   TOTAL ABDOMINAL HYSTERECTOMY     X-STOP IMPLANTATION     Family History  Problem Relation Age of Onset   Hypertension Mother    Heart disease Mother        Cardiac Arrest    Hypertension Father    Cancer Father    Hypertension Sister    Cancer Brother        Bladder Cancer    Hypertension Brother    Hypertension Sister    Social History   Socioeconomic History   Marital status: Unknown    Spouse name: Not on file   Number of children: 1   Years of education: 12   Highest education level: Some college, no degree  Occupational History   Occupation: Retired  Tobacco Use   Smoking status: Former    Current packs/day: 0.00    Average packs/day: 0.3 packs/day for 20.0 years (5.0 ttl pk-yrs)    Types: Cigarettes    Start date: 1999    Quit date: 2019    Years since quitting: 6.3   Smokeless tobacco: Never  Vaping Use   Vaping status: Never Used  Substance and Sexual Activity   Alcohol use: Not Currently     Comment: occasionally   Drug use: No   Sexual activity: Yes    Birth control/protection: Surgical  Other Topics Concern   Not on file  Social History Narrative   Not on file   Social Drivers of Health   Financial Resource Strain: Low Risk  (02/15/2024)   Overall Financial Resource Strain (CARDIA)    Difficulty of Paying Living Expenses: Not hard at all  Food Insecurity: No Food Insecurity (02/15/2024)   Hunger Vital Sign    Worried About Running Out of Food in the Last Year: Never true    Ran Out of Food in the  Last Year: Never true  Transportation Needs: No Transportation Needs (02/15/2024)   PRAPARE - Administrator, Civil Service (Medical): No    Lack of Transportation (Non-Medical): No  Physical Activity: Inactive (02/15/2024)   Exercise Vital Sign    Days of Exercise per Week: 0 days    Minutes of Exercise per Session: 0 min  Stress: No Stress Concern Present (02/15/2024)   [employer] of Occupational Health - Occupational Stress Questionnaire    Feeling of Stress : Not at all  Social Connections: Socially Isolated (02/15/2024)   Social Connection and Isolation Panel [NHANES]    Frequency of Communication with Friends and Family: More than three times a week    Frequency of Social Gatherings with Friends and Family: More than three times a week    Attends Religious Services: Never    Database administrator or Organizations: No    Attends Engineer, structural: Never    Marital Status: Never married    Tobacco Counseling Counseling given: Yes    Clinical Intake:  Pre-visit preparation completed: Yes  Pain : 0-10 Pain Score: 9  Pain Type: Chronic pain Pain Location: Leg Pain Orientation: Left Pain Descriptors / Indicators: Tingling, Burning, Radiating, Constant, Sharp Pain Onset: 1 to 4 weeks ago Pain Frequency: Constant     BMI - recorded: 28.41 Nutritional Status: BMI 25 -29 Overweight Nutritional Risks: None Diabetes: Yes CBG done?:  No Did pt. bring in CBG monitor from home?: No  Lab Results  Component Value Date   HGBA1C 6.4 (H) 01/04/2024   HGBA1C 6.6 (H) 08/29/2023   HGBA1C 5.7 07/13/2022   HGBA1C 5.7 07/13/2022   HGBA1C 5.7 07/13/2022     How often do you need to have someone help you when you read instructions, pamphlets, or other written materials from your doctor or pharmacy?: 1 - Never  Interpreter Needed?: No  Information entered by :: Rockne Chyle W CMA   Activities of Daily Living     02/15/2024    1:13 PM  In your present state of health, do you have any difficulty performing the following activities:  Hearing? 0  Vision? 0  Difficulty concentrating or making decisions? 0  Walking or climbing stairs? 1  Comment leg pain  Dressing or bathing? 1  Comment left leg pain  Doing errands, shopping? 0  Preparing Food and eating ? N  Using the Toilet? N  In the past six months, have you accidently leaked urine? N  Do you have problems with loss of bowel control? N  Managing your Medications? N  Managing your Finances? N  Housekeeping or managing your Housekeeping? Y  Comment right now due to leg pain    Patient Care Team: Meldon Sport, MD as PCP - General (Internal Medicine)  Indicate any recent Medical Services you may have received from other than [facility] providers in the past year (date may be approximate).     Assessment:    This is a routine wellness examination for Shyna.  Hearing/Vision screen Hearing Screening - Comments:: Patient denies any hearing difficulties.   Vision Screening - Comments:: Patient wears reading glasses only. Up to date with yearly exams.  Patient sees Dr. Lucendia Rusk w/ [facility] [place] office.     Goals Addressed             This Visit's Progress    Patient Stated       Remain active and healthy  Depression Screen     02/15/2024    1:19 PM 01/04/2024    1:27 PM 12/21/2023   10:43 AM 11/01/2023    1:54 PM 09/19/2023    1:22 PM  08/29/2023   10:50 AM 07/06/2023    1:37 PM  PHQ 2/9 Scores  PHQ - 2 Score 0 0 0 0 0 1 2  PHQ- 9 Score 3 0 0   2 7    Fall Risk     02/15/2024    1:14 PM 01/04/2024    1:26 PM 12/21/2023   10:43 AM 11/01/2023    1:54 PM 09/19/2023    1:22 PM  Fall Risk   Falls in the past year? 0 0 0 0 0  Number falls in past yr: 0 0 0 0 0  Injury with Fall? 0 0 0 0 0  Risk for fall due to : No Fall Risks No Fall Risks No Fall Risks No Fall Risks No Fall Risks  Follow up Falls prevention discussed;Falls evaluation completed Falls evaluation completed;Education provided;Falls prevention discussed Falls evaluation completed Falls evaluation completed Falls evaluation completed    MEDICARE RISK AT HOME:  Medicare Risk at Home Any stairs in or around the home?: Yes If so, are there any without handrails?: No Home free of loose throw rugs in walkways, pet beds, electrical cords, etc?: Yes Adequate lighting in your home to reduce risk of falls?: Yes Life alert?: No Use of a cane, walker or w/c?: Yes (as needed) Grab bars in the bathroom?: Yes Shower chair or bench in shower?: No Elevated toilet seat or a handicapped toilet?: Yes  TIMED UP AND GO:  Was the test performed?  No  Cognitive Function: 6CIT completed        02/15/2024    1:16 PM 02/06/2023   10:09 AM 10/19/2021    1:52 PM  6CIT Screen  What Year? 0 points 0 points 0 points  What month? 0 points 0 points 0 points  What time? 0 points 0 points 0 points  Count back from 20 0 points 0 points 0 points  Months in reverse 0 points 0 points 0 points  Repeat phrase 0 points 0 points 0 points  Total Score 0 points 0 points 0 points    Immunizations Immunization History  Administered Date(s) Administered   Moderna Sars-Covid-2 Vaccination 12/27/2019, 01/28/2020, 09/13/2020    Screening Tests Health Maintenance  Topic Date Due   DTaP/Tdap/Td (1 - Tdap) Never done   Pneumococcal Vaccine 14-50 Years old (1 of 2 - PCV) Never done    Zoster Vaccines- Shingrix (1 of 2) Never done   Colonoscopy  Never done   OPHTHALMOLOGY EXAM  04/20/2022   MAMMOGRAM  12/06/2023   INFLUENZA VACCINE  05/10/2024   FOOT EXAM  07/05/2024   HEMOGLOBIN A1C  07/06/2024   Diabetic kidney evaluation - Urine ACR  08/28/2024   Diabetic kidney evaluation - eGFR measurement  02/06/2025   Medicare Annual Wellness (AWV)  02/14/2025   Hepatitis C Screening  Completed   HIV Screening  Completed   HPV VACCINES  Aged Out   Meningococcal B Vaccine  Aged Out   COVID-19 Vaccine  Discontinued    Health Maintenance  Health Maintenance Due  Topic Date Due   DTaP/Tdap/Td (1 - Tdap) Never done   Pneumococcal Vaccine 10-60 Years old (1 of 2 - PCV) Never done   Zoster Vaccines- Shingrix (1 of 2) Never done   Colonoscopy  Never done  OPHTHALMOLOGY EXAM  04/20/2022   MAMMOGRAM  12/06/2023   Health Maintenance Items Addressed: Patient declined referral to GI for screening colonoscopy  Additional Screening:  Vision Screening: Recommended annual ophthalmology exams for early detection of glaucoma and other disorders of the eye.  Dental Screening: Recommended annual dental exams for proper oral hygiene  Community Resource Referral / Chronic Care Management: CRR required this visit?  No   CCM required this visit?  No   Plan:    I have personally reviewed and noted the following in the patient's chart:   Medical and social history Use of alcohol, tobacco or illicit drugs  Current medications and supplements including opioid prescriptions. Patient is currently taking opioid prescriptions. Information provided to patient regarding non-opioid alternatives. Patient advised to discuss non-opioid treatment plan with their provider. Functional ability and status Nutritional status Physical activity Advanced directives List of other physicians Hospitalizations, surgeries, and ER visits in previous 12 months Vitals Screenings to include cognitive,  depression, and falls Referrals and appointments  In addition, I have reviewed and discussed with patient certain preventive protocols, quality metrics, and best practice recommendations. A written personalized care plan for preventive services as well as general preventive health recommendations were provided to patient.   Allee Busk Pharell Rolfson, CMA   02/15/2024   After Visit Summary: (MyChart) Due to this being a telephonic visit, the after visit summary with patients personalized plan was offered to patient via MyChart   Notes: Please refer to Routing Comments.

## 2024-02-16 ENCOUNTER — Other Ambulatory Visit

## 2024-02-20 ENCOUNTER — Ambulatory Visit (HOSPITAL_COMMUNITY)

## 2024-02-20 ENCOUNTER — Encounter (HOSPITAL_COMMUNITY)

## 2024-02-20 NOTE — Discharge Instructions (Addendum)

## 2024-02-21 ENCOUNTER — Ambulatory Visit
Admission: RE | Admit: 2024-02-21 | Discharge: 2024-02-21 | Disposition: A | Discharge status: Home / Self Care | Source: Ambulatory Visit | Attending: Surgical | Admitting: Surgical

## 2024-02-21 ENCOUNTER — Ambulatory Visit
Admission: RE | Admit: 2024-02-21 | Discharge: 2024-02-21 | Disposition: A | Discharge status: Home / Self Care | Source: Ambulatory Visit | Attending: Surgical

## 2024-02-21 DIAGNOSIS — M79605 Pain in left leg: Secondary | ICD-10-CM

## 2024-02-21 DIAGNOSIS — M48061 Spinal stenosis, lumbar region without neurogenic claudication: Secondary | ICD-10-CM | POA: Diagnosis not present

## 2024-02-21 DIAGNOSIS — M4807 Spinal stenosis, lumbosacral region: Secondary | ICD-10-CM | POA: Diagnosis not present

## 2024-02-21 DIAGNOSIS — M5416 Radiculopathy, lumbar region: Secondary | ICD-10-CM

## 2024-02-21 DIAGNOSIS — M5117 Intervertebral disc disorders with radiculopathy, lumbosacral region: Secondary | ICD-10-CM | POA: Diagnosis not present

## 2024-02-21 DIAGNOSIS — M4316 Spondylolisthesis, lumbar region: Secondary | ICD-10-CM | POA: Diagnosis not present

## 2024-02-21 DIAGNOSIS — M4726 Other spondylosis with radiculopathy, lumbar region: Secondary | ICD-10-CM | POA: Diagnosis not present

## 2024-02-21 MED ORDER — IOPAMIDOL (ISOVUE-M 200) INJECTION 41%
20.0000 mL | Freq: Once | INTRAMUSCULAR | Status: AC
  Administered 2024-02-21: 20 mL via INTRATHECAL

## 2024-02-21 MED ORDER — MEPERIDINE HCL 50 MG/ML IJ SOLN: 50.0000 mg | Freq: Once | INTRAMUSCULAR | Status: DC | PRN

## 2024-02-21 MED ORDER — DIAZEPAM 5 MG PO TABS
10.0000 mg | ORAL_TABLET | Freq: Once | ORAL | Status: AC
  Administered 2024-02-21: 5 mg via ORAL

## 2024-02-21 MED ORDER — ONDANSETRON HCL 4 MG/2ML IJ SOLN: 4.0000 mg | Freq: Once | INTRAMUSCULAR | Status: DC | PRN

## 2024-02-27 ENCOUNTER — Other Ambulatory Visit: Payer: Self-pay | Admitting: *Deleted

## 2024-02-27 DIAGNOSIS — I701 Atherosclerosis of renal artery: Secondary | ICD-10-CM

## 2024-02-27 DIAGNOSIS — I6523 Occlusion and stenosis of bilateral carotid arteries: Secondary | ICD-10-CM

## 2024-02-27 NOTE — Progress Notes (Signed)
US orders placed.

## 2024-03-06 ENCOUNTER — Encounter: Payer: Self-pay | Admitting: Physician Assistant

## 2024-03-06 ENCOUNTER — Ambulatory Visit (HOSPITAL_COMMUNITY)
Admission: RE | Admit: 2024-03-06 | Discharge: 2024-03-06 | Disposition: A | Discharge status: Home / Self Care | Source: Ambulatory Visit | Attending: Vascular Surgery | Admitting: Vascular Surgery

## 2024-03-06 ENCOUNTER — Ambulatory Visit: Attending: Vascular Surgery | Admitting: Physician Assistant

## 2024-03-06 VITALS — BP 137/86 | HR 86 | Temp 98.2°F | Ht 66.0 in | Wt 180.8 lb

## 2024-03-06 DIAGNOSIS — I6521 Occlusion and stenosis of right carotid artery: Secondary | ICD-10-CM

## 2024-03-06 DIAGNOSIS — I701 Atherosclerosis of renal artery: Secondary | ICD-10-CM | POA: Diagnosis not present

## 2024-03-06 DIAGNOSIS — I6523 Occlusion and stenosis of bilateral carotid arteries: Secondary | ICD-10-CM

## 2024-03-07 ENCOUNTER — Ambulatory Visit: Admitting: Surgical

## 2024-03-07 DIAGNOSIS — M5416 Radiculopathy, lumbar region: Secondary | ICD-10-CM

## 2024-03-08 ENCOUNTER — Telehealth: Payer: Self-pay | Admitting: Internal Medicine

## 2024-03-08 NOTE — Telephone Encounter (Signed)
 Medical clearance form to stop a medication for injection procedure  Noted Copied Sleeved   Original placed in provider box. (Patient has appointment scheduled for 06.12.2025)  Copy at front desk folder

## 2024-03-10 ENCOUNTER — Encounter: Payer: Self-pay | Admitting: Surgical

## 2024-03-10 NOTE — Progress Notes (Addendum)
 Follow-up Office Visit Note   Patient: Jaclyn Brooks           Date of Birth: October 06, 1961           MRN: [id number] Visit Date: 03/07/2024 Requested by: Tobie Suzzane POUR, MD [address] [place],  [place] [address] PCP: Tobie Suzzane POUR, MD  Subjective: Chief Complaint  Patient presents with   Lower Back - Pain, Follow-up    CT/Myelogram review lumbar spine    HPI: Jaclyn Brooks is a 63 y.o. female who returns to the office for follow-up visit.    Plan at last visit was: Patient is a 63 year old female who presents for evaluation of left leg radiating pain.  She has history of left hip arthritis that has responded to cortisone injections in the past but the last injection did not help as much as it has previously.  Over the last 2 months she has now noticed more and more radicular pain down the left leg that extends outside the distribution of her typical left hip arthritis pain.  Pain radiates all the way down to the plantar foot.  She has radiographs of the lumbar spine taken today demonstrating spondylolisthesis at L4-L5 and L5-S1 with facet arthritis noted at multiple levels.  With her radicular pain and radiographic findings, need advanced imaging to elucidate any stenosis that may be contributing to her pain.  She works a very physical job so we will plan to keep her out of work for the next month as we get this figured out.  Ordered CT myelogram of the lumbar spine to evaluate left-sided radiculopathy.    Since then, patient notes continued left leg pain pain.  Patient feels like pain is getting worse.  She also has difficulty sleeping.  She has decreased endurance with standing and walking even relying on her right leg.  She finds it difficult even to just to housework around the house.  Taking Tylenol  without any relief.  Cannot take NSAIDs due to her history of only having 1 kidney.              ROS: All systems reviewed are negative as they relate to the chief complaint within the  history of present illness.  Patient denies fevers or chills.  Assessment & Plan: Visit Diagnoses:  1. Lumbar radiculopathy     Plan: Jaclyn Brooks is a 63 y.o. female who returns to the office for follow-up visit.  Plan from last visit was noted above in HPI.  They now return with continued left leg radicular pain.  She has CT myelogram demonstrating moderate left-sided foraminal stenosis at L4-L5 and L5-S1 along with disc retrusion at L5-S1 contacting the right S1 nerve roots.  Plan for lumbar spine ESI with Dr. Eldonna.  Continue out of work until after intervention.  No red flag signs or symptoms by her history today.  Follow-up after procedure if symptoms do not improve.  Follow-Up Instructions: No follow-ups on file.   Orders:  Orders Placed This Encounter  Procedures   Ambulatory referral to Physical Medicine Rehab   No orders of the defined types were placed in this encounter.     Procedures: No procedures performed   Clinical Data: No additional findings.  Objective: Vital Signs: There were no vitals taken for this visit.  Physical Exam:  Constitutional: Patient appears well-developed HEENT:  Head: Normocephalic Eyes:EOM are normal Neck: Normal range of motion Cardiovascular: Normal rate Pulmonary/chest: Effort normal Neurologic: Patient is alert  Skin: Skin is warm Psychiatric: Patient has normal mood and affect  Ortho Exam: Ortho exam demonstrates intact hip flexion, quadricep, hamstring, dorsiflexion, plantarflexion, EHL strength rated 5/5.  No clonus noted bilaterally.  Positive straight leg raise on right, negative on left.  Specialty Comments:  CT LUMBAR MYELOGRAM FINDINGS:   The lumbar spine is imaged from the midbody of T12 through to see S3-4. Grade 1 anterolisthesis at L4-5 measures 4 mm in the midline. Grade 1 anterolisthesis at L5-S1 measures 2.5 mm in midline.   Atherosclerotic calcifications are present in the aorta branch vessel. No aneurysms  are present.   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 Asymmetric right-sided renal atrophy is present. Cholecystectomy is noted.   T11-12: Negative.   T12-L1: Normal disc signal and height is present. No focal protrusion or stenosis is present.   L1-2: Mild facet hypertrophy is present bilaterally. No focal disc protrusion or stenosis is present.   L2-3: Minimal facet hypertrophy is present. No focal disc protrusion or stenosis is present.   L3-4: Mild facet hypertrophy is worse right than left. No significant disc protrusion or stenosis present.   L4-5: A broad-based disc protrusion is present. Moderate facet hypertrophy and ligamentum flavum thickening is noted. Moderate right subarticular and severe right foraminal stenosis is present. Moderate left foraminal narrowing is present.   L5-S1: A rightward disc protrusion likely contacts the traversing right S1 nerve roots. Moderate foraminal stenosis is worse left than right. Moderate facet hypertrophy is present bilaterally, left greater than right.   IMPRESSION: 1. Grade 1 degenerative anterolisthesis at L4-5 is worse with standing and exacerbated by flexion. 2. Moderate right subarticular and severe right foraminal stenosis at L4-5. 3. Moderate left foraminal narrowing at L4-5. 4. Rightward disc protrusion at L5-S1 likely contacts the traversing right S1 nerve roots. 5. Moderate foraminal stenosis bilaterally at L5-S1 is worse left than right. 6. Mild facet hypertrophy at L1-2 and L2-3 without significant stenosis. 7. 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 8. Asymmetric right-sided renal atrophy.     Electronically Signed   By: Lonni Necessary M.D.   On: 02/21/2024 12:07  Imaging: No results found.   PMFS History: Patient Active Problem List   Diagnosis Date Noted   Anxiety 12/21/2023   Flank pain 09/19/2023   RUQ pain 09/19/2023   Chronic sinusitis 08/29/2023    Enlarged skin mole 08/29/2023   Dysphagia 07/23/2023   Reactive airway disease 07/06/2023   Loose bowel movements 07/06/2023   Primary osteoarthritis of left hip 05/05/2023   Dysuria 04/16/2023   Diarrhea of presumed infectious origin 02/01/2023   Intractable migraine with aura without status migrainosus 08/15/2022   Traumatic hematoma of right lower leg 07/13/2022   History of total abdominal hysterectomy 07/04/2022   GAD (generalized anxiety disorder) 04/28/2022   Caregiver stress 04/28/2022   Chronic left-sided low back pain without sciatica 04/13/2022   Contact dermatitis 04/13/2022   Primary osteoarthritis of right knee 03/09/2022   Refused influenza vaccine 12/02/2021   Encounter for general adult medical examination with abnormal findings 11/30/2021   Epidermal cyst 11/30/2021   Acute recurrent frontal sinusitis 09/21/2021   Allergic rhinitis 08/29/2021   Nausea 08/27/2021   Hemorrhoids 07/07/2021   Gout 03/25/2021   Type 2 diabetes mellitus with other specified complication (HCC) 07/14/2020   Primary osteoarthritis of right foot 05/12/2020   GERD (gastroesophageal reflux disease) 05/31/2019   Solitary kidney, acquired 05/10/2019   Carotid arterial disease (HCC) 12/11/2018   COPD, mild (HCC)  10/01/2018   CKD (chronic kidney disease) stage 3, GFR 30-59 ml/min (HCC) 10/01/2018   Aortic valve sclerosis 10/01/2018   Hypertension 09/11/2018   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HCC)     Family History  Problem Relation Age of Onset   Hypertension Mother    Heart disease Mother        Cardiac Arrest    Hypertension Father    Cancer Father    Hypertension Sister    Cancer Brother        Bladder Cancer    Hypertension Brother    Hypertension Sister     Past Surgical History:  Procedure  Laterality Date   CHOLECYSTECTOMY N/A 01/28/2015   Procedure: LAPAROSCOPIC CHOLECYSTECTOMY;  Surgeon: Oneil Budge Md, MD;  Location: [facility];  Service: General;  Laterality: N/A;   excision of bone spurs Bilateral    Big toes   renal aneurysm Right    right coil-and only has function of left kidney   TOTAL ABDOMINAL HYSTERECTOMY     X-STOP IMPLANTATION     Social History   Occupational History   Occupation: Retired  Tobacco Use   Smoking status: Former    Current packs/day: 0.00    Average packs/day: 0.3 packs/day for 20.0 years (5.0 ttl pk-yrs)    Types: Cigarettes    Start date: 1999    Quit date: 2019    Years since quitting: 6.4   Smokeless tobacco: Never  Vaping Use   Vaping status: Never Used  Substance and Sexual Activity   Alcohol use: Not Currently    Comment: occasionally   Drug use: No   Sexual activity: Yes    Birth control/protection: Surgical

## 2024-03-11 ENCOUNTER — Telehealth: Payer: Self-pay

## 2024-03-11 DIAGNOSIS — F411 Generalized anxiety disorder: Secondary | ICD-10-CM

## 2024-03-11 MED ORDER — DIAZEPAM 5 MG PO TABS: ORAL_TABLET | ORAL | 0 refills | Status: DC

## 2024-03-11 NOTE — Telephone Encounter (Signed)
 Valium sent.

## 2024-03-11 NOTE — Telephone Encounter (Signed)
 Patient asking for Valium  for Injection on the June 26th, call [facility] in [place].

## 2024-03-11 NOTE — Telephone Encounter (Signed)
 Received form on 6/2  Faxed to [phone] with confirmation  Form sent to scan

## 2024-03-14 ENCOUNTER — Ambulatory Visit (HOSPITAL_COMMUNITY)
Admission: RE | Admit: 2024-03-14 | Discharge: 2024-03-14 | Disposition: A | Discharge status: Home / Self Care | Source: Ambulatory Visit | Attending: Internal Medicine

## 2024-03-14 ENCOUNTER — Ambulatory Visit (HOSPITAL_COMMUNITY)
Admission: RE | Admit: 2024-03-14 | Discharge: 2024-03-14 | Disposition: A | Discharge status: Home / Self Care | Source: Ambulatory Visit | Attending: Internal Medicine | Admitting: Internal Medicine

## 2024-03-14 DIAGNOSIS — R928 Other abnormal and inconclusive findings on diagnostic imaging of breast: Secondary | ICD-10-CM | POA: Diagnosis not present

## 2024-03-14 DIAGNOSIS — R92333 Mammographic heterogeneous density, bilateral breasts: Secondary | ICD-10-CM | POA: Diagnosis not present

## 2024-03-17 NOTE — Progress Notes (Signed)
 Office Note   History of Present Illness   Jaclyn Brooks is a 63 y.o. (1961/03/20) female who presents for follow-up.  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   She returns today for follow-up.  She says that she is doing well without any new, major health concerns.  She denies any significant changes to her kidney function.  Her blood pressure is well-controlled.  She denies any strokelike symptoms such as slurred speech, facial droop, sudden weakness/numbness, or sudden visual changes.  Current Outpatient Medications  Medication Sig Dispense Refill   albuterol  (VENTOLIN  HFA) 108 (90 Base) MCG/ACT inhaler Inhale 1-2 puffs into the lungs every 6 (six) hours as needed for wheezing or shortness of breath. 1 each 2   allopurinol  (ZYLOPRIM ) 100 MG tablet Take 2 tablets by mouth once daily 60 tablet 11   atenolol  (TENORMIN ) 50 MG tablet Take 1 tablet (50 mg total) by mouth daily. 90 tablet 1   benzonatate  (TESSALON ) 100 MG capsule Take 1 capsule (100 mg total) by mouth 2 (two) times daily as needed for cough. 20 capsule 0   butalbital -acetaminophen -caffeine  (FIORICET) 50-325-40 MG tablet Take 1 tablet by mouth every 12 (twelve) hours as needed for headache or migraine. 20 tablet 0   CARTIA  XT 240 MG 24 hr capsule Take 1 capsule (240 mg total) by mouth daily. 90 capsule 3   cetirizine  (ZYRTEC ) 10 MG tablet Take 1 tablet (10 mg total) by mouth daily. 30 tablet 0   clopidogrel  (PLAVIX ) 75 MG tablet Take 1 tablet (75 mg total) by mouth every evening. 90 tablet 3   dexlansoprazole  (DEXILANT ) 60 MG capsule Take 1 capsule by mouth once daily 30 capsule 0   dicyclomine  (BENTYL ) 10 MG capsule Take 1 capsule (10 mg total) by mouth 3 (three) times daily as needed for spasms. 30 capsule 1   fluticasone -salmeterol (ADVAIR) 100-50 MCG/ACT AEPB Inhale 1 puff into the lungs 2 (two) times daily. 60 each 2   guaiFENesin  (MUCINEX ) 600  MG 12 hr tablet Take 1 tablet (600 mg total) by mouth 2 (two) times daily. 30 tablet 0   hydrochlorothiazide  (HYDRODIURIL ) 25 MG tablet Take 1 tablet (25 mg total) by mouth daily. 90 tablet 1   hydrOXYzine  (ATARAX ) 10 MG tablet Take 1 tablet (10 mg total) by mouth every 8 (eight) hours as needed for anxiety. 20 tablet 0   methocarbamol  (ROBAXIN ) 500 MG tablet Take 1 tablet (500 mg total) by mouth every 8 (eight) hours as needed for muscle spasms. 30 tablet 1   montelukast  (SINGULAIR ) 10 MG tablet Take 1 tablet (10 mg total) by mouth at bedtime. 30 tablet 3   ondansetron  (ZOFRAN -ODT) 4 MG disintegrating tablet Take 1 tablet (4 mg total) by mouth every 8 (eight) hours as needed. 20 tablet 0   predniSONE  (STERAPRED UNI-PAK 21 TAB) 10 MG (21) TBPK tablet Take as directed on package 21 tablet 0   tiZANidine  (ZANAFLEX ) 4 MG tablet Take 1 tablet (4 mg total) by mouth every 8 (eight) hours as needed for muscle spasms. 30 tablet 0   traMADol  (ULTRAM ) 50 MG tablet Take 1 tablet (50 mg total) by mouth every 6 (six) hours as needed. 15 tablet 0   diazepam  (VALIUM ) 5 MG tablet Take 1 by mouth 1 hour  pre-procedure with very light food. Do not drive motor vehicle. 1 tablet 0   No current facility-administered medications for this visit.  REVIEW OF SYSTEMS (negative unless checked):   Cardiac:  []  Chest pain or chest pressure? []  Shortness of breath upon activity? []  Shortness of breath when lying flat? []  Irregular heart rhythm?  Vascular:  []  Pain in calf, thigh, or hip brought on by walking? []  Pain in feet at night that wakes you up from your sleep? []  Blood clot in your veins? []  Leg swelling?  Pulmonary:  []  Oxygen  at home? []  Productive cough? []  Wheezing?  Neurologic:  []  Sudden weakness in arms or legs? []  Sudden numbness in arms or legs? []  Sudden onset of difficult speaking or slurred speech? []  Temporary loss of vision in one eye? []  Problems with dizziness?  Gastrointestinal:   []  Blood in stool? []  Vomited blood?  Genitourinary:  []  Burning when urinating? []  Blood in urine?  Psychiatric:  []  Major depression  Hematologic:  []  Bleeding problems? []  Problems with blood clotting?  Dermatologic:  []  Rashes or ulcers?  Constitutional:  []  Fever or chills?  Ear/Nose/Throat:  []  Change in hearing? []  Nose bleeds? []  Sore throat?  Musculoskeletal:  []  Back pain? []  Joint pain? []  Muscle pain?   Physical Examination    Vitals:   03/06/24 0958 03/06/24 1001  BP: (!) 153/74 137/86  Pulse: 86   Temp: 98.2 F (36.8 C)   TempSrc: Temporal   SpO2: 98%   Weight: 180 lb 12.8 oz (82 kg)   Height: 5\' 6"  (1.676 m)    Body mass index is 29.18 kg/m.  General:  WDWN in NAD; vital signs documented above Gait: Not observed HENT: WNL, normocephalic Pulmonary: normal non-labored breathing , without rales, rhonchi,  wheezing Cardiac: regular Abdomen: soft, NT, no masses Skin: without rashes Vascular Exam/Pulses: palpable radial pulses bilaterally Extremities: without ischemic changes, without gangrene , without cellulitis; without open wounds;  Musculoskeletal: no muscle wasting or atrophy  Neurologic: A&O X 3;  No focal weakness or paresthesias are detected Psychiatric:  The pt has Normal affect.  Non-Invasive Vascular Imaging   Bilateral Carotid Duplex (03/06/2024):  R ICA stenosis:  Occluded R VA:  patent and antegrade L ICA stenosis:  1-39% L VA:  patent and antegrade  Renal Artery Duplex (03/06/2024): No left renal artery stenosis   Medical Decision Making   Jaclyn Brooks is a 63 y.o. female who presents for follow up  Based on the patient's vascular studies, she has a known right carotid artery occlusion. Her left ICA stenosis is unchanged at 1-39% Renal artery duplex demonstrates no left renal artery stenosis.She has a history of right renal artery embolization Her blood pressure has been well controlled. Her renal function has  been stable. She denies any stroke like symptoms such as slurred speech, facial droop, sudden visual changes, or sudden weakness/numbness.  She has no neuro deficits on exam. She has palpable and equal radial pulses bilaterally She can follow up with our office in 1 year with carotid duplex and renal artery duplex   Deneise Finlay PA-C [facility] Office: [phone]  Clinic MD: Vikki Graves

## 2024-03-19 ENCOUNTER — Ambulatory Visit: Admitting: Internal Medicine

## 2024-03-19 ENCOUNTER — Encounter: Payer: Self-pay | Admitting: Internal Medicine

## 2024-03-19 ENCOUNTER — Other Ambulatory Visit (HOSPITAL_COMMUNITY): Payer: Self-pay | Admitting: Internal Medicine

## 2024-03-19 VITALS — BP 134/86 | HR 65 | Ht 66.0 in | Wt 180.0 lb

## 2024-03-19 DIAGNOSIS — M5416 Radiculopathy, lumbar region: Secondary | ICD-10-CM | POA: Diagnosis not present

## 2024-03-19 DIAGNOSIS — N1832 Chronic kidney disease, stage 3b: Secondary | ICD-10-CM

## 2024-03-19 DIAGNOSIS — E1169 Type 2 diabetes mellitus with other specified complication: Secondary | ICD-10-CM

## 2024-03-19 DIAGNOSIS — I10 Essential (primary) hypertension: Secondary | ICD-10-CM

## 2024-03-19 DIAGNOSIS — R928 Other abnormal and inconclusive findings on diagnostic imaging of breast: Secondary | ICD-10-CM

## 2024-03-19 DIAGNOSIS — F411 Generalized anxiety disorder: Secondary | ICD-10-CM

## 2024-03-19 NOTE — Patient Instructions (Signed)
 Please continue to take medications as prescribed.  Please continue to follow low salt diet and perform moderate exercise/walking as tolerated.

## 2024-03-19 NOTE — Progress Notes (Unsigned)
 Established Patient Office Visit  Subjective:  Patient ID: Jaclyn Brooks, female    DOB: May 20, 1961  Age: 63 y.o. MRN: [id number]  CC:  Chief Complaint  Patient presents with   Medical Management of Chronic Issues    Pt states she is here for a check up.     HPI Jaclyn Brooks is a 63 y.o. 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  HTN: Her BP has been elevated at times lately due to stress at work and home. Her BP was wnl today. She denies any headache, dizziness, chest pain, dyspnea or palpitations currently.  She is currently taking atenolol  50 mg QD, Cartia  240 mg QD and hydrochlorothiazide  25 mg QD.  Her heart rate remains in 50s.  She did not tolerate hydralazine that was prescribed by nephrology.  CKD: She follows up with [facility].  Denies any dysuria, hematuria or urinary hesitancy or resistance currently.  She admits that she takes few [employer] every day.  She has chronic nasal congestion, postnasal drip and sinus pressure related headache.  Of note, she takes Singulair  and Zyrtec  PRN for her allergies currently.  She has tried OTC antihistaminics without much relief.  GERD: She has chronic acid reflux and epigastric discomfort.  She has tried omeprazole and pantoprazole  without much relief.  She tolerates Dexilant  well, but has difficulty obtaining it at times due to cost concern.  Lumbar radiculopathy:    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[facility])     Past Surgical History:  Procedure Laterality Date   CHOLECYSTECTOMY N/A 01/28/2015   Procedure: LAPAROSCOPIC CHOLECYSTECTOMY;  Surgeon: Alanda Allegra Md, MD;   Location: [facility];  Service: General;  Laterality: N/A;   excision of bone spurs Bilateral    Big toes   renal aneurysm Right    right coil-and only has function of left kidney   TOTAL ABDOMINAL HYSTERECTOMY     X-STOP IMPLANTATION      Family History  Problem Relation Age of Onset   Hypertension Mother    Heart disease Mother        Cardiac Arrest    Hypertension Father    Cancer Father    Hypertension Sister    Cancer Brother        Bladder Cancer    Hypertension Brother    Hypertension Sister     Social History   Socioeconomic History   Marital status: Unknown    Spouse name: Not on file   Number of children: 1   Years of education: 12   Highest education level: Some college, no degree  Occupational History   Occupation: Retired  Tobacco Use   Smoking status: Former    Current packs/day: 0.00    Average packs/day: 0.3 packs/day for 20.0 years (5.0 ttl pk-yrs)    Types: Cigarettes    Start date: 1999    Quit date: 2019    Years since quitting: 6.4   Smokeless tobacco: Never  Vaping Use   Vaping status: Never Used  Substance and Sexual Activity   Alcohol use: Not Currently  Comment: occasionally   Drug use: No   Sexual activity: Yes    Birth control/protection: Surgical  Other Topics Concern   Not on file  Social History Narrative   Not on file   Social Drivers of Health   Financial Resource Strain: Low Risk  (02/15/2024)   Overall Financial Resource Strain (CARDIA)    Difficulty of Paying Living Expenses: Not hard at all  Food Insecurity: No Food Insecurity (02/15/2024)   Hunger Vital Sign    Worried About Running Out of Food in the Last Year: Never true    Ran Out of Food in the Last Year: Never true  Transportation Needs: No Transportation Needs (02/15/2024)   PRAPARE - Administrator, Civil Service (Medical): No    Lack of Transportation (Non-Medical): No  Physical Activity: Inactive (02/15/2024)   Exercise Vital Sign    Days of Exercise  per Week: 0 days    Minutes of Exercise per Session: 0 min  Stress: No Stress Concern Present (02/15/2024)   [employer] of Occupational Health - Occupational Stress Questionnaire    Feeling of Stress : Not at all  Social Connections: Socially Isolated (02/15/2024)   Social Connection and Isolation Panel [NHANES]    Frequency of Communication with Friends and Family: More than three times a week    Frequency of Social Gatherings with Friends and Family: More than three times a week    Attends Religious Services: Never    Database administrator or Organizations: No    Attends Banker Meetings: Never    Marital Status: Never married  Intimate Partner Violence: Not At Risk (02/15/2024)   Humiliation, Afraid, Rape, and Kick questionnaire    Fear of Current or Ex-Partner: No    Emotionally Abused: No    Physically Abused: No    Sexually Abused: No    Outpatient Medications Prior to Visit  Medication Sig Dispense Refill   albuterol  (VENTOLIN  HFA) 108 (90 Base) MCG/ACT inhaler Inhale 1-2 puffs into the lungs every 6 (six) hours as needed for wheezing or shortness of breath. 1 each 2   allopurinol  (ZYLOPRIM ) 100 MG tablet Take 2 tablets by mouth once daily 60 tablet 11   atenolol  (TENORMIN ) 50 MG tablet Take 1 tablet (50 mg total) by mouth daily. 90 tablet 1   benzonatate  (TESSALON ) 100 MG capsule Take 1 capsule (100 mg total) by mouth 2 (two) times daily as needed for cough. 20 capsule 0   butalbital -acetaminophen -caffeine  (FIORICET) 50-325-40 MG tablet Take 1 tablet by mouth every 12 (twelve) hours as needed for headache or migraine. 20 tablet 0   CARTIA  XT 240 MG 24 hr capsule Take 1 capsule (240 mg total) by mouth daily. 90 capsule 3   cetirizine  (ZYRTEC ) 10 MG tablet Take 1 tablet (10 mg total) by mouth daily. 30 tablet 0   clopidogrel  (PLAVIX ) 75 MG tablet Take 1 tablet (75 mg total) by mouth every evening. 90 tablet 3   dexlansoprazole  (DEXILANT ) 60 MG capsule Take 1  capsule by mouth once daily 30 capsule 0   diazepam  (VALIUM ) 5 MG tablet Take 1 by mouth 1 hour  pre-procedure with very light food. Do not drive motor vehicle. 1 tablet 0   dicyclomine  (BENTYL ) 10 MG capsule Take 1 capsule (10 mg total) by mouth 3 (three) times daily as needed for spasms. 30 capsule 1   fluticasone -salmeterol (ADVAIR) 100-50 MCG/ACT AEPB Inhale 1 puff into the lungs 2 (two) times daily. 60  each 2   guaiFENesin  (MUCINEX ) 600 MG 12 hr tablet Take 1 tablet (600 mg total) by mouth 2 (two) times daily. 30 tablet 0   hydrochlorothiazide  (HYDRODIURIL ) 25 MG tablet Take 1 tablet (25 mg total) by mouth daily. 90 tablet 1   hydrOXYzine  (ATARAX ) 10 MG tablet Take 1 tablet (10 mg total) by mouth every 8 (eight) hours as needed for anxiety. 20 tablet 0   methocarbamol  (ROBAXIN ) 500 MG tablet Take 1 tablet (500 mg total) by mouth every 8 (eight) hours as needed for muscle spasms. 30 tablet 1   montelukast  (SINGULAIR ) 10 MG tablet Take 1 tablet (10 mg total) by mouth at bedtime. 30 tablet 3   ondansetron  (ZOFRAN -ODT) 4 MG disintegrating tablet Take 1 tablet (4 mg total) by mouth every 8 (eight) hours as needed. 20 tablet 0   tiZANidine  (ZANAFLEX ) 4 MG tablet Take 1 tablet (4 mg total) by mouth every 8 (eight) hours as needed for muscle spasms. 30 tablet 0   traMADol  (ULTRAM ) 50 MG tablet Take 1 tablet (50 mg total) by mouth every 6 (six) hours as needed. 15 tablet 0   predniSONE  (STERAPRED UNI-PAK 21 TAB) 10 MG (21) TBPK tablet Take as directed on package 21 tablet 0   No facility-administered medications prior to visit.    Allergies  Allergen Reactions   Amoxil  [Amoxicillin ] Hives   Bee Venom Hives   Nsaids     Kidney disease    Shellfish Allergy Itching   Latex Rash    Pt states she gets blisters     ROS Review of Systems  Constitutional:  Negative for chills and fever.  HENT:  Positive for congestion, postnasal drip and sinus pressure. Negative for sinus pain.   Eyes:  Negative  for pain and discharge.  Respiratory:  Negative for cough and shortness of breath.   Cardiovascular:  Negative for chest pain and palpitations.  Gastrointestinal:  Negative for abdominal pain, diarrhea and vomiting.  Endocrine: Negative for polydipsia and polyuria.  Genitourinary:  Negative for dysuria and hematuria.  Musculoskeletal:  Positive for arthralgias, back pain and neck pain. Negative for neck stiffness.  Skin:  Negative for rash.  Neurological:  Negative for dizziness and weakness.  Psychiatric/Behavioral:  Positive for agitation. Negative for behavioral problems. The patient is nervous/anxious.       Objective:    Physical Exam Vitals reviewed.  Constitutional:      General: She is not in acute distress.    Appearance: She is not diaphoretic.  HENT:     Head: Normocephalic and atraumatic.     Mouth/Throat:     Mouth: Mucous membranes are moist.  Eyes:     General: No scleral icterus.    Extraocular Movements: Extraocular movements intact.  Cardiovascular:     Rate and Rhythm: Normal rate and regular rhythm.     Heart sounds: Normal heart sounds. No murmur heard. Pulmonary:     Breath sounds: Normal breath sounds. No wheezing or rales.  Abdominal:     Palpations: Abdomen is soft.     Tenderness: There is no abdominal tenderness.  Musculoskeletal:     Cervical back: Neck supple. No tenderness.     Right lower leg: No edema.     Left lower leg: No edema.  Skin:    General: Skin is warm.     Findings: No rash.  Neurological:     General: No focal deficit present.     Mental Status: She is alert and  oriented to person, place, and time.     Cranial Nerves: No cranial nerve deficit.     Sensory: No sensory deficit.     Motor: No weakness.  Psychiatric:        Mood and Affect: Mood normal.        Behavior: Behavior normal.     BP 134/86 (BP Location: Left Arm)   Pulse 65   Ht 5\' 6"  (1.676 m)   Wt 180 lb (81.6 kg)   SpO2 99%   BMI 29.05 kg/m  Wt  Readings from Last 3 Encounters:  03/19/24 180 lb (81.6 kg)  03/06/24 180 lb 12.8 oz (82 kg)  02/15/24 176 lb (79.8 kg)    Lab Results  Component Value Date   TSH 0.948 01/04/2024   Lab Results  Component Value Date   WBC 6.9 01/04/2024   HGB 14.0 01/04/2024   HCT 41.6 01/04/2024   MCV 98 (H) 01/04/2024   PLT 322 01/04/2024   Lab Results  Component Value Date   NA 138 02/07/2024   K 3.9 02/07/2024   CO2 24 02/07/2024   GLUCOSE 98 02/07/2024   BUN 25 (H) 02/07/2024   CREATININE 1.49 (H) 02/07/2024   BILITOT <0.2 01/04/2024   ALKPHOS 68 01/04/2024   AST 18 01/04/2024   ALT 20 01/04/2024   PROT 7.0 01/04/2024   ALBUMIN 4.1 01/04/2024   CALCIUM  10.2 02/07/2024   ANIONGAP 10 02/07/2024   EGFR 39 (L) 01/04/2024   Lab Results  Component Value Date   CHOL 216 (H) 01/04/2024   Lab Results  Component Value Date   HDL 33 (L) 01/04/2024   Lab Results  Component Value Date   LDLCALC 145 (H) 01/04/2024   Lab Results  Component Value Date   TRIG 206 (H) 01/04/2024   Lab Results  Component Value Date   CHOLHDL 6.5 (H) 01/04/2024   Lab Results  Component Value Date   HGBA1C 6.4 (H) 01/04/2024      Assessment & Plan:   Problem List Items Addressed This Visit   None     No orders of the defined types were placed in this encounter.   Follow-up: No follow-ups on file.    Meldon Sport, MD

## 2024-03-19 NOTE — Assessment & Plan Note (Signed)
    03/19/2024    2:02 PM 01/04/2024    1:27 PM 12/21/2023   10:48 AM 08/29/2023   10:51 AM  GAD 7 : Generalized Anxiety Score  Nervous, Anxious, on Edge 2 2 2  0  Control/stop worrying 0 3 3 1   Worry too much - different things 0 3 3 1   Trouble relaxing 0 0 0 1  Restless 0 0 0 0  Easily annoyed or irritable 2 3 3  0  Afraid - awful might happen 0 0 0 1  Total GAD 7 Score 4 11 11 4   Anxiety Difficulty Not difficult at all Somewhat difficult Somewhat difficult Somewhat difficult   Recent stress at work and home has worsened her anxiety Has hydroxyzine  as needed for anxiety Prefers to avoid maintenance treatment with SSRI or SNRI Relaxation material provided in the previous visit Advised to contact therapist/counselor at her workplace

## 2024-03-20 NOTE — Assessment & Plan Note (Signed)
 BP Readings from Last 1 Encounters:  03/19/24 134/86   Well-controlled with Atenolol , Cartia  and HCTZ Recent stress has caused fluctuation in her blood pressure Did not tolerate Hydralazine in the past Needs to cut down soft drink intake Counseled for compliance with the medications Advised DASH diet and moderate exercise/walking, at least 150 mins/week

## 2024-03-20 NOTE — Assessment & Plan Note (Signed)
 Last BMP showed GFR around 34 in 12/24 Follows up with Nephrologist - Dr. Arlis Lakes, last visit note reviewed Avoid nephrotoxic agents Maintain adequate hydration

## 2024-03-20 NOTE — Assessment & Plan Note (Signed)
 Has been evaluated by Orthopedic surgery MRI of lumbar spine reviewed Planned to see PM&R for epidural injection Avoid heavy lifting and frequent bending Tylenol  as needed for pain

## 2024-03-20 NOTE — Assessment & Plan Note (Signed)
 Lab Results  Component Value Date   HGBA1C 6.4 (H) 01/04/2024   Diet controlled Associated with HTN, HLD and CKD Not on statin, does not prefer to take any new medicine

## 2024-03-21 ENCOUNTER — Ambulatory Visit: Payer: Self-pay | Admitting: Internal Medicine

## 2024-03-26 ENCOUNTER — Encounter (HOSPITAL_COMMUNITY)

## 2024-03-26 ENCOUNTER — Ambulatory Visit (HOSPITAL_COMMUNITY)

## 2024-04-03 DIAGNOSIS — N1832 Chronic kidney disease, stage 3b: Secondary | ICD-10-CM | POA: Diagnosis not present

## 2024-04-04 ENCOUNTER — Ambulatory Visit: Admitting: Physical Medicine and Rehabilitation

## 2024-04-04 ENCOUNTER — Other Ambulatory Visit: Payer: Self-pay

## 2024-04-04 VITALS — BP 160/84 | HR 50

## 2024-04-04 DIAGNOSIS — M5416 Radiculopathy, lumbar region: Secondary | ICD-10-CM

## 2024-04-04 MED ORDER — METHYLPREDNISOLONE ACETATE 40 MG/ML IJ SUSP
40.0000 mg | Freq: Once | INTRAMUSCULAR | Status: AC
  Administered 2024-04-04: 40 mg

## 2024-04-04 NOTE — Progress Notes (Signed)
 Pain Scale   Average Pain 8 Patient advising she has chronic lower back pain that radiates bilaterally to legs. Patient advising her pain increases at night and when she is trying to get up out of bed. Patient advising that walking seems to decrease the pain.        +Driver, -BT, -Dye Allergies.

## 2024-04-04 NOTE — Patient Instructions (Signed)

## 2024-04-04 NOTE — Progress Notes (Signed)
 [name] - 63 y.o. female MRN [id number]  Date of birth: 02-05-1961  Office Visit Note: Visit Date: 04/04/2024 PCP: Tobie Suzzane POUR, MD Referred by: Tobie Suzzane POUR, MD  Subjective: Chief Complaint  Patient presents with   Lower Back - Pain   HPI:  Jaclyn Brooks is a 63 y.o. female who comes in today at the request of Jaclyn Calix, PA-C for planned Left L5-S1 Lumbar Transforaminal epidural steroid injection with fluoroscopic guidance.  The patient has failed conservative care including home exercise, medications, time and activity modification.  This injection will be diagnostic and hopefully therapeutic.  Please see requesting physician notes for further details and justification.   ROS Otherwise per HPI.  Assessment & Plan: Visit Diagnoses:    ICD-10-CM   1. Lumbar radiculopathy  M54.16 XR C-ARM NO REPORT    Epidural Steroid injection    methylPREDNISolone  acetate (DEPO-MEDROL ) injection 40 mg      Plan: No additional findings.   Meds & Orders:  Meds ordered this encounter  Medications   methylPREDNISolone  acetate (DEPO-MEDROL ) injection 40 mg    Orders Placed This Encounter  Procedures   XR C-ARM NO REPORT   Epidural Steroid injection    Follow-up: Return for visit to requesting provider as needed.   Procedures: No procedures performed  Lumbosacral Transforaminal Epidural Steroid Injection - Sub-Pedicular Approach with Fluoroscopic Guidance  Patient: Jaclyn Brooks      Date of Birth: November 21, 1960 MRN: [id number] PCP: Tobie Suzzane POUR, MD      Visit Date: 04/04/2024   Universal Protocol:    Date/Time: 04/04/2024  Consent Given By: the patient  Position: PRONE  Additional Comments: Vital signs were monitored before and after the procedure. Patient was prepped and draped in the usual sterile fashion. The correct patient, procedure, and site was verified.   Injection Procedure Details:   Procedure diagnoses: Lumbar radiculopathy [M54.16]    Meds  Administered:  Meds ordered this encounter  Medications   methylPREDNISolone  acetate (DEPO-MEDROL ) injection 40 mg    Laterality: Left  Location/Site: L5  Needle:5.0 in., 22 ga.  Short bevel or Quincke spinal needle  Needle Placement: Transforaminal  Findings:    -Comments: Excellent flow of contrast along the nerve, nerve root and into the epidural space.  Procedure Details: After squaring off the end-plates to get a true AP view, the C-arm was positioned so that an oblique view of the foramen as noted above was visualized. The target area is just inferior to the nose of the scotty dog or sub pedicular. The soft tissues overlying this structure were infiltrated with 2-3 ml. of 1% Lidocaine  without Epinephrine.  The spinal needle was inserted toward the target using a trajectory view along the fluoroscope beam.  Under AP and lateral visualization, the needle was advanced so it did not puncture dura and was located close the 6 O'Clock position of the pedical in AP tracterory. Biplanar projections were used to confirm position. Aspiration was confirmed to be negative for CSF and/or blood. A 1-2 ml. volume of Isovue -250 was injected and flow of contrast was noted at each level. Radiographs were obtained for documentation purposes.   After attaining the desired flow of contrast documented above, a 0.5 to 1.0 ml test dose of 0.25% Marcaine  was injected into each respective transforaminal space.  The patient was observed for 90 seconds post injection.  After no sensory deficits were reported, and normal lower extremity motor function was noted,   the above injectate  was administered so that equal amounts of the injectate were placed at each foramen (level) into the transforaminal epidural space.   Additional Comments:  The patient tolerated the procedure well Dressing: 2 x 2 sterile gauze and Band-Aid    Post-procedure details: Patient was observed during the procedure. Post-procedure  instructions were reviewed.  Patient left the clinic in stable condition.    Clinical History: CT LUMBAR MYELOGRAM FINDINGS:   The lumbar spine is imaged from the midbody of T12 through to see S3-4. Grade 1 anterolisthesis at L4-5 measures 4 mm in the midline. Grade 1 anterolisthesis at L5-S1 measures 2.5 mm in midline.   Atherosclerotic calcifications are present in the aorta branch vessel. No aneurysms are present.   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 Asymmetric right-sided renal atrophy is present. Cholecystectomy is noted.   T11-12: Negative.   T12-L1: Normal disc signal and height is present. No focal protrusion or stenosis is present.   L1-2: Mild facet hypertrophy is present bilaterally. No focal disc protrusion or stenosis is present.   L2-3: Minimal facet hypertrophy is present. No focal disc protrusion or stenosis is present.   L3-4: Mild facet hypertrophy is worse right than left. No significant disc protrusion or stenosis present.   L4-5: A broad-based disc protrusion is present. Moderate facet hypertrophy and ligamentum flavum thickening is noted. Moderate right subarticular and severe right foraminal stenosis is present. Moderate left foraminal narrowing is present.   L5-S1: A rightward disc protrusion likely contacts the traversing right S1 nerve roots. Moderate foraminal stenosis is worse left than right. Moderate facet hypertrophy is present bilaterally, left greater than right.   IMPRESSION: 1. Grade 1 degenerative anterolisthesis at L4-5 is worse with standing and exacerbated by flexion. 2. Moderate right subarticular and severe right foraminal stenosis at L4-5. 3. Moderate left foraminal narrowing at L4-5. 4. Rightward disc protrusion at L5-S1 likely contacts the traversing right S1 nerve roots. 5. Moderate foraminal stenosis bilaterally at L5-S1 is worse left than right. 6. Mild facet hypertrophy at L1-2 and L2-3 without  significant stenosis. 7. 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 8. Asymmetric right-sided renal atrophy.     Electronically Signed   By: Lonni Necessary M.D.   On: 02/21/2024 12:07     Objective:  VS:  HT:    WT:   BMI:     BP:(!) 160/84  HR:(!) 50bpm  TEMP: ( )  RESP:  Physical Exam Vitals and nursing note reviewed.  Constitutional:      General: She is not in acute distress.    Appearance: Normal appearance. She is not ill-appearing.  HENT:     Head: Normocephalic and atraumatic.     Right Ear: External ear normal.     Left Ear: External ear normal.   Eyes:     Extraocular Movements: Extraocular movements intact.    Cardiovascular:     Rate and Rhythm: Normal rate.     Pulses: Normal pulses.  Pulmonary:     Effort: Pulmonary effort is normal. No respiratory distress.  Abdominal:     General: There is no distension.     Palpations: Abdomen is soft.   Musculoskeletal:        General: Tenderness present.     Cervical back: Neck supple.     Right lower leg: No edema.     Left lower leg: No edema.     Comments: Patient has good distal strength with no pain over the greater trochanters.  No clonus or focal weakness.   Skin:    Findings: No erythema, lesion or rash.   Neurological:     General: No focal deficit present.     Mental Status: She is alert and oriented to person, place, and time.     Sensory: No sensory deficit.     Motor: No weakness or abnormal muscle tone.     Coordination: Coordination normal.   Psychiatric:        Mood and Affect: Mood normal.        Behavior: Behavior normal.      Imaging: No results found.

## 2024-04-04 NOTE — Procedures (Signed)
 Lumbosacral Transforaminal Epidural Steroid Injection - Sub-Pedicular Approach with Fluoroscopic Guidance  Patient: Jaclyn Brooks      Date of Birth: 07-Jan-1961 MRN: [id number] PCP: Tobie Suzzane POUR, MD      Visit Date: 04/04/2024   Universal Protocol:    Date/Time: 04/04/2024  Consent Given By: the patient  Position: PRONE  Additional Comments: Vital signs were monitored before and after the procedure. Patient was prepped and draped in the usual sterile fashion. The correct patient, procedure, and site was verified.   Injection Procedure Details:   Procedure diagnoses: Lumbar radiculopathy [M54.16]    Meds Administered:  Meds ordered this encounter  Medications   methylPREDNISolone  acetate (DEPO-MEDROL ) injection 40 mg    Laterality: Left  Location/Site: L5  Needle:5.0 in., 22 ga.  Short bevel or Quincke spinal needle  Needle Placement: Transforaminal  Findings:    -Comments: Excellent flow of contrast along the nerve, nerve root and into the epidural space.  Procedure Details: After squaring off the end-plates to get a true AP view, the C-arm was positioned so that an oblique view of the foramen as noted above was visualized. The target area is just inferior to the nose of the scotty dog or sub pedicular. The soft tissues overlying this structure were infiltrated with 2-3 ml. of 1% Lidocaine  without Epinephrine.  The spinal needle was inserted toward the target using a trajectory view along the fluoroscope beam.  Under AP and lateral visualization, the needle was advanced so it did not puncture dura and was located close the 6 O'Clock position of the pedical in AP tracterory. Biplanar projections were used to confirm position. Aspiration was confirmed to be negative for CSF and/or blood. A 1-2 ml. volume of Isovue -250 was injected and flow of contrast was noted at each level. Radiographs were obtained for documentation purposes.   After attaining the desired flow of  contrast documented above, a 0.5 to 1.0 ml test dose of 0.25% Marcaine  was injected into each respective transforaminal space.  The patient was observed for 90 seconds post injection.  After no sensory deficits were reported, and normal lower extremity motor function was noted,   the above injectate was administered so that equal amounts of the injectate were placed at each foramen (level) into the transforaminal epidural space.   Additional Comments:  The patient tolerated the procedure well Dressing: 2 x 2 sterile gauze and Band-Aid    Post-procedure details: Patient was observed during the procedure. Post-procedure instructions were reviewed.  Patient left the clinic in stable condition.

## 2024-04-08 ENCOUNTER — Encounter: Payer: Self-pay | Admitting: Surgical

## 2024-04-08 ENCOUNTER — Ambulatory Visit: Admitting: Surgical

## 2024-04-08 DIAGNOSIS — M5416 Radiculopathy, lumbar region: Secondary | ICD-10-CM | POA: Diagnosis not present

## 2024-04-08 DIAGNOSIS — M1612 Unilateral primary osteoarthritis, left hip: Secondary | ICD-10-CM

## 2024-04-08 NOTE — Progress Notes (Signed)
 Follow-up Office Visit Note   Patient: Jaclyn Brooks           Date of Birth: 01/21/1961           MRN: [id number] Visit Date: 04/08/2024 Requested by: Tobie Suzzane POUR, MD [address] [place],  [place] [address] PCP: Tobie Suzzane POUR, MD  Subjective: Chief Complaint  Patient presents with   Lower Back - Pain, Follow-up    HPI: Jaclyn Brooks is a 63 y.o. female who returns to the office for follow-up visit.    Plan at last visit was: Jaclyn Brooks is a 63 y.o. female who returns to the office for follow-up visit.  Plan from last visit was noted above in HPI.  They now return with continued left leg radicular pain.  She has CT myelogram demonstrating moderate left-sided foraminal stenosis at L4-L5 and L5-S1 along with disc retrusion at L5-S1 contacting the right S1 nerve roots.  Plan for lumbar spine ESI with Dr. Eldonna.  Continue out of work until after intervention.  No red flag signs or symptoms by her history today.  Follow-up after procedure if symptoms do not improve.   Since then, patient notes she had lumbar spine ESI on 04/04/2024.  She did have some good symptomatic relief for the first day prior to that tapering off.  She has not noticed a lot of substantial improvement currently though it is too early to tell exactly.  She has continued low back pain.  Has some groin pain consistent with her hip arthritis.  Also has radicular leg pain that extends from the left leg down to the ankle and occasionally to the foot.  Takes Tylenol  with some relief but cannot take NSAIDs.  Some days her back pain is worse than the leg pain and some days the leg pain is worse than the back pain.              ROS: All systems reviewed are negative as they relate to the chief complaint within the history of present illness.  Patient denies fevers or chills.  Assessment & Plan: Visit Diagnoses:  1. Lumbar radiculopathy   2. Arthritis of left hip     Plan: Jaclyn Brooks is a 63 y.o. female who returns  to the office for follow-up visit.  Plan from last visit was noted above in HPI.  They now return with continued low back and left leg radicular pain.  Had recent lumbar spine ESI on 04/04/2024.  Honestly it is too early to tell how much relief she will get from the [facility] though she did note good symptomatic relief for the first day.  We discussed options available to patient including waiting this out and potentially repeating a similar injection with Dr. Eldonna at the next possible interval versus repeating left hip cortisone intra-articular injection versus diagnostic hip intra-articular injection versus physical therapy.  Plan at this time is to allow the injection to take full effect and then start physical therapy for her low back pain and left leg pain around 2 weeks after lumbar spine ESI.  We will see her back in about 2 months for reevaluation.  She will call the office 2 weeks out from the injection to let us  know how she is doing.  Also filled out forms for her for return to work.  She should be okay to return to work for activities that do not involve any heavy lifting more than 10 pounds or extended  walking/standing.  Primarily sedentary work should be manageable for her.  Follow-Up Instructions: No follow-ups on file.   Orders:  No orders of the defined types were placed in this encounter.  No orders of the defined types were placed in this encounter.     Procedures: No procedures performed   Clinical Data: No additional findings.  Objective: Vital Signs: There were no vitals taken for this visit.  Physical Exam:  Constitutional: Patient appears well-developed HEENT:  Head: Normocephalic Eyes:EOM are normal Neck: Normal range of motion Cardiovascular: Normal rate Pulmonary/chest: Effort normal Neurologic: Patient is alert Skin: Skin is warm Psychiatric: Patient has normal mood and affect  Ortho Exam: Ortho exam demonstrates intact hip flexion, quadricep, hamstring,  dorsiflexion, plantarflexion, EHL strength rated 5/5.  Does have some reproducible pain with hip range of motion reproducing groin and thigh pain.  No clonus noted bilaterally.  Specialty Comments:  CT LUMBAR MYELOGRAM FINDINGS:   The lumbar spine is imaged from the midbody of T12 through to see S3-4. Grade 1 anterolisthesis at L4-5 measures 4 mm in the midline. Grade 1 anterolisthesis at L5-S1 measures 2.5 mm in midline.   Atherosclerotic calcifications are present in the aorta branch vessel. No aneurysms are present.   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 Asymmetric right-sided renal atrophy is present. Cholecystectomy is noted.   T11-12: Negative.   T12-L1: Normal disc signal and height is present. No focal protrusion or stenosis is present.   L1-2: Mild facet hypertrophy is present bilaterally. No focal disc protrusion or stenosis is present.   L2-3: Minimal facet hypertrophy is present. No focal disc protrusion or stenosis is present.   L3-4: Mild facet hypertrophy is worse right than left. No significant disc protrusion or stenosis present.   L4-5: A broad-based disc protrusion is present. Moderate facet hypertrophy and ligamentum flavum thickening is noted. Moderate right subarticular and severe right foraminal stenosis is present. Moderate left foraminal narrowing is present.   L5-S1: A rightward disc protrusion likely contacts the traversing right S1 nerve roots. Moderate foraminal stenosis is worse left than right. Moderate facet hypertrophy is present bilaterally, left greater than right.   IMPRESSION: 1. Grade 1 degenerative anterolisthesis at L4-5 is worse with standing and exacerbated by flexion. 2. Moderate right subarticular and severe right foraminal stenosis at L4-5. 3. Moderate left foraminal narrowing at L4-5. 4. Rightward disc protrusion at L5-S1 likely contacts the traversing right S1 nerve roots. 5. Moderate foraminal stenosis  bilaterally at L5-S1 is worse left than right. 6. Mild facet hypertrophy at L1-2 and L2-3 without significant stenosis. 7. 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 8. Asymmetric right-sided renal atrophy.     Electronically Signed   By: Lonni Necessary M.D.   On: 02/21/2024 12:07  Imaging: No results found.   PMFS History: Patient Active Problem List   Diagnosis Date Noted   Lumbar radiculopathy 03/19/2024   Anxiety 12/21/2023   Flank pain 09/19/2023   RUQ pain 09/19/2023   Chronic sinusitis 08/29/2023   Enlarged skin mole 08/29/2023   Dysphagia 07/23/2023   Reactive airway disease 07/06/2023   Loose bowel movements 07/06/2023   Primary osteoarthritis of left hip 05/05/2023   Dysuria 04/16/2023   Diarrhea of presumed infectious origin 02/01/2023   Intractable migraine with aura without status migrainosus 08/15/2022   Traumatic hematoma of right lower leg 07/13/2022   History of total abdominal hysterectomy 07/04/2022   GAD (generalized anxiety disorder) 04/28/2022   Caregiver stress 04/28/2022   Chronic  left-sided low back pain without sciatica 04/13/2022   Contact dermatitis 04/13/2022   Primary osteoarthritis of right knee 03/09/2022   Refused influenza vaccine 12/02/2021   Encounter for general adult medical examination with abnormal findings 11/30/2021   Epidermal cyst 11/30/2021   Acute recurrent frontal sinusitis 09/21/2021   Allergic rhinitis 08/29/2021   Nausea 08/27/2021   Hemorrhoids 07/07/2021   Gout 03/25/2021   Type 2 diabetes mellitus with other specified complication (HCC) 07/14/2020   Primary osteoarthritis of right foot 05/12/2020   GERD (gastroesophageal reflux disease) 05/31/2019   Solitary kidney, acquired 05/10/2019   Carotid arterial disease (HCC) 12/11/2018   COPD, 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/01/2018   CKD (chronic kidney disease) stage 3, GFR 30-59 ml/min (HCC) 10/01/2018   Aortic valve sclerosis 10/01/2018   Hypertension 09/11/2018    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HCC)     Family History  Problem Relation Age of Onset   Hypertension Mother    Heart disease Mother        Cardiac Arrest    Hypertension Father    Cancer Father    Hypertension Sister    Cancer Brother        Bladder Cancer    Hypertension Brother    Hypertension Sister     Past Surgical History:  Procedure Laterality Date   CHOLECYSTECTOMY N/A 01/28/2015   Procedure: LAPAROSCOPIC CHOLECYSTECTOMY;  Surgeon: Oneil Budge Md, MD;  Location: [facility];  Service: General;  Laterality: N/A;   excision of bone spurs Bilateral    Big toes   renal aneurysm Right    right coil-and only has function of left kidney   TOTAL ABDOMINAL HYSTERECTOMY     X-STOP IMPLANTATION     Social History   Occupational History   Occupation: Retired  Tobacco Use   Smoking status: Former    Current packs/day: 0.00    Average packs/day: 0.3 packs/day for 20.0 years (5.0 ttl pk-yrs)    Types: Cigarettes    Start date: 1999    Quit date: 2019    Years since quitting: 6.4   Smokeless tobacco: Never  Vaping Use   Vaping status: Never Used  Substance and Sexual Activity   Alcohol use: Not Currently    Comment: occasionally   Drug use: No   Sexual activity: Yes    Birth control/protection: Surgical

## 2024-04-11 ENCOUNTER — Telehealth: Payer: Self-pay | Admitting: Radiology

## 2024-04-11 NOTE — Telephone Encounter (Signed)
 Noted. Thank you. I will bring you forms that I completed so that you can scan them into the chart when I am back in the [facility] office.

## 2024-04-11 NOTE — Telephone Encounter (Signed)
 Patient had given Luke forms for completion to [employer] and [employer], as well as Counselling psychologist from [employer]. Both forms were completed and faxed.    Patient also stated that information would need to be sent to [facility].  Tammy-did you receive new form for her?  If not, can we possibly send last office note?

## 2024-04-11 NOTE — Telephone Encounter (Signed)
 New form has not been received. 04/08/24 office note faxed to [employer] [phone].

## 2024-04-16 ENCOUNTER — Encounter (HOSPITAL_COMMUNITY)

## 2024-04-16 ENCOUNTER — Other Ambulatory Visit (HOSPITAL_COMMUNITY)

## 2024-04-18 ENCOUNTER — Telehealth: Payer: Self-pay | Admitting: Surgical

## 2024-04-18 NOTE — Telephone Encounter (Signed)
 Patient states she needs the form(ADA ACCOMMODATION CERTIFICATION) emailed to [email or web address]. She was told today that the fax machine was down and they did not receive the form.   Please call patient once done [phone]

## 2024-04-18 NOTE — Telephone Encounter (Signed)
Form emailed per pt's request

## 2024-04-18 NOTE — Telephone Encounter (Signed)
 Patient states she was told by ANDREZ that her return to work date was 05/08/24 and she states that the form for [employer] and [employer] has a date of 04/15/24 and it needs to be changed to 05/08/24 to match what [employer] has.   Pt states she will come back to see Herlene on 05/06/24 and talk with him.

## 2024-04-18 NOTE — Telephone Encounter (Signed)
 noted

## 2024-04-23 NOTE — Telephone Encounter (Signed)
 We can plan on RTW date of 7/30 if that's agreeable with her and her work.  I don't think she has to come back to discuss if this works for her and I'm not sure there's anything we would necessarily change about the plan for PT for her LBP and leg pain, especially since she is against any sort of surgical intervention for her hip or back

## 2024-04-26 NOTE — Telephone Encounter (Signed)
 Okay this is fine.  Thanks [employer]

## 2024-04-29 ENCOUNTER — Ambulatory Visit
Admission: RE | Admit: 2024-04-29 | Discharge: 2024-04-29 | Disposition: A | Discharge status: Home / Self Care | Attending: Family Medicine | Admitting: Family Medicine

## 2024-04-29 VITALS — BP 155/76 | HR 54 | Temp 97.8°F | Resp 18

## 2024-04-29 DIAGNOSIS — K5900 Constipation, unspecified: Secondary | ICD-10-CM

## 2024-04-29 DIAGNOSIS — R1010 Upper abdominal pain, unspecified: Secondary | ICD-10-CM | POA: Diagnosis not present

## 2024-04-29 DIAGNOSIS — K921 Melena: Secondary | ICD-10-CM | POA: Diagnosis not present

## 2024-04-29 DIAGNOSIS — K219 Gastro-esophageal reflux disease without esophagitis: Secondary | ICD-10-CM | POA: Diagnosis not present

## 2024-04-29 DIAGNOSIS — L0231 Cutaneous abscess of buttock: Secondary | ICD-10-CM

## 2024-04-29 MED ORDER — DOXYCYCLINE HYCLATE 100 MG PO CAPS: 100.0000 mg | ORAL_CAPSULE | Freq: Two times a day (BID) | ORAL | 0 refills | Status: DC

## 2024-04-29 MED ORDER — SUCRALFATE 1 G PO TABS: 1.0000 g | ORAL_TABLET | Freq: Three times a day (TID) | ORAL | 0 refills | Status: DC | PRN

## 2024-04-29 MED ORDER — DEXLANSOPRAZOLE 60 MG PO CPDR: 1.0000 | DELAYED_RELEASE_CAPSULE | Freq: Every day | ORAL | 0 refills | Status: DC

## 2024-04-29 MED ORDER — HYDROCORTISONE ACETATE 25 MG RE SUPP: 25.0000 mg | Freq: Two times a day (BID) | RECTAL | 0 refills | Status: AC

## 2024-04-29 NOTE — Discharge Instructions (Signed)
 I have refilled your dexlansoprazole  and given you a medicine called sucralfate  that you may dissolve into water and drink up to 3 times daily as needed prior to meals to help coat and soothe your esophagus and stomach.  I have prescribed an antibiotic for the boils in your buttock region and you may do warm compresses, keep areas clean.  Of also sent over some steroid suppositories to help with possible inflamed hemorrhoids. Keep bowel movements soft and regular and avoid straining as much as possible.

## 2024-04-29 NOTE — ED Triage Notes (Signed)
 Nausea, RUQ pain, blood in stool x 2-3 days. Pt states she has a boil above rectum, and groin x 3 days. Tried OTC boil ease and Pepto.

## 2024-04-30 NOTE — ED Provider Notes (Signed)
 [facility]    CSN: [id number] Arrival date & time: 04/29/24  1314      History   Chief Complaint Chief Complaint  Patient presents with   Nausea    Break outin hot sweat. Boil buttock. Might have a bacteria infection? - Entered by patient   Recurrent Skin Infections   Abdominal Pain    HPI Jaclyn Brooks is a 63 y.o. female.   Patient presenting today with several day history of nausea, upper abdominal pain, bloody stools, constipation and straining, gluteal abscess.  States she has been out of her Dexilant  for several months now and that this is the only thing that helps with her ongoing acid reflux issues.  Denies nausea, vomiting, hematemesis, melena, fevers, chills.  Trying Pepto-Bismol with minimal relief.  Has been trying boil ease topically to the papular lesions in the gluteal cleft with mild improvement.     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[facility])     Patient Active Problem List   Diagnosis Date Noted   Lumbar radiculopathy 03/19/2024   Anxiety 12/21/2023   Flank pain 09/19/2023   RUQ pain 09/19/2023   Chronic sinusitis 08/29/2023   Enlarged skin mole 08/29/2023   Dysphagia 07/23/2023   Reactive airway disease 07/06/2023   Loose bowel movements 07/06/2023   Primary osteoarthritis of left hip 05/05/2023   Dysuria 04/16/2023   Diarrhea of presumed infectious origin 02/01/2023   Intractable migraine with aura without status migrainosus 08/15/2022   Traumatic hematoma of right lower leg 07/13/2022   History of total abdominal hysterectomy 07/04/2022   GAD (generalized anxiety disorder) 04/28/2022   Caregiver stress 04/28/2022   Chronic left-sided low back pain without sciatica 04/13/2022   Contact dermatitis 04/13/2022   Primary osteoarthritis of right knee  03/09/2022   Refused influenza vaccine 12/02/2021   Encounter for general adult medical examination with abnormal findings 11/30/2021   Epidermal cyst 11/30/2021   Acute recurrent frontal sinusitis 09/21/2021   Allergic rhinitis 08/29/2021   Nausea 08/27/2021   Hemorrhoids 07/07/2021   Gout 03/25/2021   Type 2 diabetes mellitus with other specified complication (HCC) 07/14/2020   Primary osteoarthritis of right foot 05/12/2020   GERD (gastroesophageal reflux disease) 05/31/2019   Solitary kidney, acquired 05/10/2019   Carotid arterial disease (HCC) 12/11/2018   COPD, 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/01/2018   CKD (chronic kidney disease) stage 3, GFR 30-59 ml/min (HCC) 10/01/2018   Aortic valve sclerosis 10/01/2018   Hypertension 09/11/2018    Past Surgical History:  Procedure Laterality Date   CHOLECYSTECTOMY N/A 01/28/2015   Procedure: LAPAROSCOPIC CHOLECYSTECTOMY;  Surgeon: Oneil Budge Md, MD;  Location: [facility];  Service: General;  Laterality: N/A;   excision of bone spurs Bilateral    Big toes   renal aneurysm Right    right coil-and only has function of left kidney   TOTAL ABDOMINAL HYSTERECTOMY     X-STOP IMPLANTATION      OB History     Gravida  1   Para  1   Term      Preterm  1   AB      Living         SAB      IAB  Ectopic      Multiple      Live Births               Home Medications    Prior to Admission medications   Medication Sig Start Date End Date Taking? Authorizing Provider  allopurinol  (ZYLOPRIM ) 100 MG tablet Take 2 tablets by mouth once daily 12/19/23  Yes Tobie Suzzane POUR, MD  atenolol  (TENORMIN ) 50 MG tablet Take 1 tablet (50 mg total) by mouth daily. 08/29/23  Yes Tobie Suzzane POUR, MD  CARTIA  XT 240 MG 24 hr capsule Take 1 capsule (240 mg total) by mouth daily. 08/29/23  Yes Tobie Suzzane POUR, MD  clopidogrel  (PLAVIX ) 75 MG tablet Take 1 tablet (75 mg total) by mouth every evening. 08/29/23  Yes Tobie Suzzane POUR, MD  doxycycline  (VIBRAMYCIN )  100 MG capsule Take 1 capsule (100 mg total) by mouth 2 (two) times daily. 04/29/24  Yes Stuart Vernell Norris, PA-C  fluticasone -salmeterol (ADVAIR) 100-50 MCG/ACT AEPB Inhale 1 puff into the lungs 2 (two) times daily. 01/04/24  Yes Tobie Suzzane POUR, MD  hydrochlorothiazide  (HYDRODIURIL ) 25 MG tablet Take 1 tablet (25 mg total) by mouth daily. 08/29/23  Yes Tobie Suzzane POUR, MD  hydrocortisone  (ANUSOL -HC) 25 MG suppository Place 1 suppository (25 mg total) rectally 2 (two) times daily. 04/29/24  Yes Stuart Vernell Norris, PA-C  sucralfate  (CARAFATE ) 1 g tablet Take 1 tablet (1 g total) by mouth 3 (three) times daily as needed. May dissolve 1 tablet into a glass of water and drink up to 3 times daily as needed 04/29/24  Yes Stuart, Vernell Norris, PA-C  albuterol  (VENTOLIN  HFA) 108 (90 Base) MCG/ACT inhaler Inhale 1-2 puffs into the lungs every 6 (six) hours as needed for wheezing or shortness of breath. 12/21/23   Zarwolo, Gloria, FNP  benzonatate  (TESSALON ) 100 MG capsule Take 1 capsule (100 mg total) by mouth 2 (two) times daily as needed for cough. 01/19/24   Enedelia Dorna HERO, FNP  butalbital -acetaminophen -caffeine  (FIORICET) 50-325-40 MG tablet Take 1 tablet by mouth every 12 (twelve) hours as needed for headache or migraine. 08/15/22   Tobie Suzzane POUR, MD  cetirizine  (ZYRTEC ) 10 MG tablet Take 1 tablet (10 mg total) by mouth daily. 11/24/23   Leath-Warren, Etta PARAS, NP  dexlansoprazole  (DEXILANT ) 60 MG capsule Take 1 capsule (60 mg total) by mouth daily. 04/29/24   Stuart Vernell Norris, PA-C  diazepam  (VALIUM ) 5 MG tablet Take 1 by mouth 1 hour  pre-procedure with very light food. Do not drive motor vehicle. 03/11/24   Eldonna Novel, MD  dicyclomine  (BENTYL ) 10 MG capsule Take 1 capsule (10 mg total) by mouth 3 (three) times daily as needed for spasms. 07/06/23   Tobie Suzzane POUR, MD  guaiFENesin  (MUCINEX ) 600 MG 12 hr tablet Take 1 tablet (600 mg total) by mouth 2 (two) times daily. 01/19/24    Enedelia Dorna HERO, FNP  hydrOXYzine  (ATARAX ) 10 MG tablet Take 1 tablet (10 mg total) by mouth every 8 (eight) hours as needed for anxiety. 12/18/23   Chandra Harlene LABOR, NP  methocarbamol  (ROBAXIN ) 500 MG tablet Take 1 tablet (500 mg total) by mouth every 8 (eight) hours as needed for muscle spasms. 09/22/23   Magnant, Carlin CROME, PA-C  montelukast  (SINGULAIR ) 10 MG tablet Take 1 tablet (10 mg total) by mouth at bedtime. 07/06/23   Tobie Suzzane POUR, MD  ondansetron  (ZOFRAN -ODT) 4 MG disintegrating tablet Take 1 tablet (4 mg total) by mouth every 8 (eight) hours as needed.  06/05/23   Leath-Warren, Etta PARAS, NP  traMADol  (ULTRAM ) 50 MG tablet Take 1 tablet (50 mg total) by mouth every 6 (six) hours as needed. 02/07/24   Idol, Julie, PA-C  atorvastatin  (LIPITOR) 20 MG tablet Take 1 tablet (20 mg total) by mouth at bedtime. 11/25/19 02/18/20  Bari Theodoro FALCON, MD    Family History Family History  Problem Relation Age of Onset   Hypertension Mother    Heart disease Mother        Cardiac Arrest    Hypertension Father    Cancer Father    Hypertension Sister    Cancer Brother        Bladder Cancer    Hypertension Brother    Hypertension Sister     Social History Social History   Tobacco Use   Smoking status: Former    Current packs/day: 0.00    Average packs/day: 0.3 packs/day for 20.0 years (5.0 ttl pk-yrs)    Types: Cigarettes    Start date: 1999    Quit date: 2019    Years since quitting: 6.5   Smokeless tobacco: Never  Vaping Use   Vaping status: Never Used  Substance Use Topics   Alcohol use: Not Currently    Comment: occasionally   Drug use: No     Allergies   Amoxil  [amoxicillin ], Bee venom, Nsaids, Shellfish allergy, and Latex   Review of Systems Review of Systems Per HPI  Physical Exam Triage Vital Signs ED Triage Vitals  Encounter Vitals Group     BP 04/29/24 1316 (!) 155/76     Girls Systolic BP Percentile --      Girls Diastolic BP Percentile --       Boys Systolic BP Percentile --      Boys Diastolic BP Percentile --      Pulse Rate 04/29/24 1316 (!) 54     Resp 04/29/24 1316 18     Temp 04/29/24 1316 97.8 F (36.6 C)     Temp Source 04/29/24 1316 Oral     SpO2 04/29/24 1316 95 %     Weight --      Height --      Head Circumference --      Peak Flow --      Pain Score 04/29/24 1321 3     Pain Loc --      Pain Education --      Exclude from Growth Chart --    No data found.  Updated Vital Signs BP (!) 155/76 (BP Location: Right Arm)   Pulse (!) 54   Temp 97.8 F (36.6 C) (Oral)   Resp 18   SpO2 95%   Visual Acuity Right Eye Distance:   Left Eye Distance:   Bilateral Distance:    Right Eye Near:   Left Eye Near:    Bilateral Near:     Physical Exam Vitals and nursing note reviewed. Exam conducted with a chaperone present.  Constitutional:      Appearance: Normal appearance. She is not ill-appearing.  HENT:     Head: Atraumatic.     Mouth/Throat:     Mouth: Mucous membranes are moist.  Eyes:     Extraocular Movements: Extraocular movements intact.     Conjunctiva/sclera: Conjunctivae normal.  Cardiovascular:     Rate and Rhythm: Normal rate and regular rhythm.     Heart sounds: Normal heart sounds.  Pulmonary:     Effort: Pulmonary effort is normal.  Breath sounds: Normal breath sounds.  Abdominal:     General: Bowel sounds are normal. There is no distension.     Palpations: Abdomen is soft.     Tenderness: There is abdominal tenderness. There is no right CVA tenderness, left CVA tenderness or guarding.     Comments: Mild epigastric tenderness to palpation without distention or guarding  Genitourinary:    Comments: Two slightly fluctuant erythematous papular lesions to gluteal folds bilaterally Musculoskeletal:        General: Normal range of motion.     Cervical back: Normal range of motion and neck supple.  Skin:    General: Skin is warm and dry.  Neurological:     Mental Status: She is alert  and oriented to person, place, and time.  Psychiatric:        Mood and Affect: Mood normal.        Thought Content: Thought content normal.        Judgment: Judgment normal.    UC Treatments / Results  Labs (all labs ordered are listed, but only abnormal results are displayed) Labs Reviewed - No data to display  EKG   Radiology No results found.  Procedures Procedures (including critical care time)  Medications Ordered in UC Medications - No data to display  Initial Impression / Assessment and Plan / UC Course  I have reviewed the triage vital signs and the nursing notes.  Pertinent labs & imaging results that were available during my care of the patient were reviewed by me and considered in my medical decision making (see chart for details).     Mildly hypertensive in triage, otherwise vital signs reassuring.  She is presenting with multiple complaints today.  Suspect her upper abdominal pain is some mild gastritis secondary to uncontrolled GERD.  Will refill her Dexilant  and provide Carafate  for as needed use.  Discussed bland foods, fluids.  Suspect the bloody stools are related to internal hemorrhoids from constipation.  Discussed good bowel regimen, Anusol  suppositories.  Regarding the gluteal abscesses, will treat with short course of doxycycline , good topical wound care.  Return for worsening symptoms.  Final Clinical Impressions(s) / UC Diagnoses   Final diagnoses:  Upper abdominal pain  Gastroesophageal reflux disease without esophagitis  Constipation, unspecified constipation type  Hematochezia  Gluteal abscess     Discharge Instructions      I have refilled your dexlansoprazole  and given you a medicine called sucralfate  that you may dissolve into water and drink up to 3 times daily as needed prior to meals to help coat and soothe your esophagus and stomach.  I have prescribed an antibiotic for the boils in your buttock region and you may do warm compresses,  keep areas clean.  Of also sent over some steroid suppositories to help with possible inflamed hemorrhoids. Keep bowel movements soft and regular and avoid straining as much as possible.    ED Prescriptions     Medication Sig Dispense Auth. Provider   sucralfate  (CARAFATE ) 1 g tablet Take 1 tablet (1 g total) by mouth 3 (three) times daily as needed. May dissolve 1 tablet into a glass of water and drink up to 3 times daily as needed 30 tablet Stuart Vernell Norris, PA-C   hydrocortisone  (ANUSOL -HC) 25 MG suppository Place 1 suppository (25 mg total) rectally 2 (two) times daily. 12 suppository Stuart Vernell [place], PA-C   doxycycline  (VIBRAMYCIN ) 100 MG capsule Take 1 capsule (100 mg total) by mouth 2 (two) times daily. 14  capsule Stuart Vernell Norris, PA-C   dexlansoprazole  (DEXILANT ) 60 MG capsule Take 1 capsule (60 mg total) by mouth daily. 30 capsule Stuart Vernell Norris, [place]      PDMP not reviewed this encounter.   Stuart Vernell Norris, PA-C 04/30/24 1439

## 2024-05-06 ENCOUNTER — Ambulatory Visit: Admitting: Surgical

## 2024-05-06 ENCOUNTER — Encounter: Payer: Self-pay | Admitting: Surgical

## 2024-05-06 DIAGNOSIS — M5416 Radiculopathy, lumbar region: Secondary | ICD-10-CM

## 2024-05-06 NOTE — Progress Notes (Signed)
 Office Visit Note   Patient: Jaclyn Brooks           Date of Birth: Aug 21, 1961           MRN: [id number] Visit Date: 05/06/2024 Requested by: Tobie Suzzane POUR, MD [address] [place],  [place] [address] PCP: Tobie Suzzane POUR, MD  Subjective: Chief Complaint  Patient presents with  . Lower Back - Pain, Follow-up  . Left Leg - Pain, Follow-up    HPI: Jaclyn Brooks is a 63 y.o. female who presents to the office reporting low back pain and left hip pain.  Patient states she still has some pain in the groin at times and very sporadic radicular pain down to the left foot.  Overall she is a lot better than she was at her last visit and she feels she is ready for return to work.  Takes Tylenol  on occasion..                ROS: All systems reviewed are negative as they relate to the chief complaint within the history of present illness.  Patient denies fevers or chills.  Assessment & Plan: Visit Diagnoses:  1. Lumbar radiculopathy     Plan: Plan at this time is return to work in 2 weeks after 3 sessions of PT.  She will do 1 session of PT this week in [place] and then 2 sessions next week for left hip arthritis and low back pain with left leg radicular symptoms.  Return to work on 05/20/2024 after she has had several sessions of PT and then she will follow-up as needed.  Do think that it is in her best interest long-term to avoid overdoing it in terms of ambulation and stairs to avoid exacerbating her left hip arthritis.  Also needs to avoid a lot of heavy lifting to avoid exacerbating her radicular symptoms.  Follow-Up Instructions: Return if symptoms worsen or fail to improve.   Orders:  Orders Placed This Encounter  Procedures  . Ambulatory referral to Physical Therapy   No orders of the defined types were placed in this encounter.     Procedures: No procedures performed   Clinical Data: No additional findings.  Objective: Vital Signs: There were no vitals taken for this  visit.  Physical Exam:  Constitutional: Patient appears well-developed HEENT:  Head: Normocephalic Eyes:EOM are normal Neck: Normal range of motion Cardiovascular: Normal rate Pulmonary/chest: Effort normal Neurologic: Patient is alert Skin: Skin is warm Psychiatric: Patient has normal mood and affect  Ortho Exam: Ortho exam demonstrates intact hip flexion, quadricep, hamstring, dorsiflexion, plantarflexion strength rated 5/5.  Minimal pain with hip range of motion.  Mild Trendelenburg gait with ambulation.  Specialty Comments:  CT LUMBAR MYELOGRAM FINDINGS:   The lumbar spine is imaged from the midbody of T12 through to see S3-4. Grade 1 anterolisthesis at L4-5 measures 4 mm in the midline. Grade 1 anterolisthesis at L5-S1 measures 2.5 mm in midline.   Atherosclerotic calcifications are present in the aorta branch vessel. No aneurysms are present.   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 Asymmetric right-sided renal atrophy is present. Cholecystectomy is noted.   T11-12: Negative.   T12-L1: Normal disc signal and height is present. No focal protrusion or stenosis is present.   L1-2: Mild facet hypertrophy is present bilaterally. No focal disc protrusion or stenosis is present.   L2-3: Minimal facet hypertrophy is present. No focal disc protrusion or stenosis is present.   L3-4:  Mild facet hypertrophy is worse right than left. No significant disc protrusion or stenosis present.   L4-5: A broad-based disc protrusion is present. Moderate facet hypertrophy and ligamentum flavum thickening is noted. Moderate right subarticular and severe right foraminal stenosis is present. Moderate left foraminal narrowing is present.   L5-S1: A rightward disc protrusion likely contacts the traversing right S1 nerve roots. Moderate foraminal stenosis is worse left than right. Moderate facet hypertrophy is present bilaterally, left greater than right.    IMPRESSION: 1. Grade 1 degenerative anterolisthesis at L4-5 is worse with standing and exacerbated by flexion. 2. Moderate right subarticular and severe right foraminal stenosis at L4-5. 3. Moderate left foraminal narrowing at L4-5. 4. Rightward disc protrusion at L5-S1 likely contacts the traversing right S1 nerve roots. 5. Moderate foraminal stenosis bilaterally at L5-S1 is worse left than right. 6. Mild facet hypertrophy at L1-2 and L2-3 without significant stenosis. 7. 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 8. Asymmetric right-sided renal atrophy.     Electronically Signed   By: Lonni Necessary M.D.   On: 02/21/2024 12:07  Imaging: No results found.   PMFS History: Patient Active Problem List   Diagnosis Date Noted  . Lumbar radiculopathy 03/19/2024  . Anxiety 12/21/2023  . Flank pain 09/19/2023  . RUQ pain 09/19/2023  . Chronic sinusitis 08/29/2023  . Enlarged skin mole 08/29/2023  . Dysphagia 07/23/2023  . Reactive airway disease 07/06/2023  . Loose bowel movements 07/06/2023  . Primary osteoarthritis of left hip 05/05/2023  . Dysuria 04/16/2023  . Diarrhea of presumed infectious origin 02/01/2023  . Intractable migraine with aura without status migrainosus 08/15/2022  . Traumatic hematoma of right lower leg 07/13/2022  . History of total abdominal hysterectomy 07/04/2022  . GAD (generalized anxiety disorder) 04/28/2022  . Caregiver stress 04/28/2022  . Chronic left-sided low back pain without sciatica 04/13/2022  . Contact dermatitis 04/13/2022  . Primary osteoarthritis of right knee 03/09/2022  . Refused influenza vaccine 12/02/2021  . Encounter for general adult medical examination with abnormal findings 11/30/2021  . Epidermal cyst 11/30/2021  . Acute recurrent frontal sinusitis 09/21/2021  . Allergic rhinitis 08/29/2021  . Nausea 08/27/2021  . Hemorrhoids 07/07/2021  . Gout 03/25/2021  . Type 2 diabetes mellitus with other specified  complication (HCC) 07/14/2020  . 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 GERD (gastroesophageal reflux disease) 05/31/2019  . Solitary kidney, acquired 05/10/2019  . Carotid arterial disease (HCC) 12/11/2018  . COPD, 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/01/2018  . CKD (chronic kidney disease) stage 3, GFR 30-59 ml/min (HCC) 10/01/2018  . Aortic valve sclerosis 10/01/2018  . Hypertension 09/11/2018   Past Medical History:  Diagnosis Date  . Allergy   . Asthma    Phreesia 11/25/2020  . Chronic kidney disease    Phreesia 11/25/2020  . Depression   . Gout   . Hyperlipidemia   . Hypertension   . 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 Hypertension Mother   . Heart disease Mother        Cardiac Arrest   . Hypertension Father   . Cancer Father   . Hypertension Sister   . Cancer Brother        Bladder Cancer   . Hypertension Brother   . Hypertension Sister     Past Surgical History:  Procedure Laterality Date  . CHOLECYSTECTOMY N/A 01/28/2015   Procedure: LAPAROSCOPIC CHOLECYSTECTOMY;  Surgeon: Oneil Budge Md, MD;  Location: [facility];  Service: General;  Laterality: N/A;  . excision of bone spurs Bilateral    Big toes  . renal aneurysm Right    right coil-and only has function of left kidney  . TOTAL ABDOMINAL HYSTERECTOMY    . X-STOP IMPLANTATION     Social History   Occupational History  . Occupation: Retired  Tobacco Use  . Smoking status: Former    Current packs/day: 0.00    Average packs/day: 0.3 packs/day for 20.0 years (5.0 ttl pk-yrs)    Types: Cigarettes    Start date: 110    Quit date: 2019    Years since quitting: 6.5  . Smokeless tobacco: Never  Vaping Use  . Vaping status: Never Used  Substance and Sexual Activity  . Alcohol use: Not Currently    Comment: occasionally  . Drug use: No  . Sexual activity: Yes    Birth control/protection: Surgical

## 2024-05-07 ENCOUNTER — Telehealth: Payer: Self-pay | Admitting: Surgical

## 2024-05-07 ENCOUNTER — Ambulatory Visit (HOSPITAL_COMMUNITY): Admission: RE | Admit: 2024-05-07 | Source: Ambulatory Visit

## 2024-05-07 ENCOUNTER — Inpatient Hospital Stay (HOSPITAL_COMMUNITY): Admission: RE | Admit: 2024-05-07 | Source: Ambulatory Visit

## 2024-05-07 NOTE — Telephone Encounter (Signed)
 Pt called requesting a call from [facility]. Pt did not leave a reason for back back. Please call pt at [phone].

## 2024-05-07 NOTE — Telephone Encounter (Signed)
 Pt walked into office and states she has a return to certification to be completed and return by 05/09/24. Pt states she has emailed there paperwork to [employer].   Pt would like to have form faxed back to [phone]  Attn:Capt Trice and [employer] once completed.  Pt request a call back [phone] before completing the form.

## 2024-05-07 NOTE — Telephone Encounter (Signed)
Duplicate message in chart.  

## 2024-05-07 NOTE — Telephone Encounter (Signed)
 I called and spoke with patient. She states that this issue with work is tearing her nerves up.  She went in today and was told that they do not have anything light duty that she could do and that if she couldn't report to work full duty on 05/09/2024, she would lose her job. She does not feel that she can do full duty.  She has asked for extended leave of absence and is unsure if she will get it. She would like for you to take her out until at least September.  I asked if work would keep her if you took her out for an extended amount of time, and she states that she does not know, but that she has short term and long term disability and that will continue to be paid out. I explained that I would have to send Herlene this message and he would ultimately decide about work. She has scheduled an appointment for 05/24/2024 as well to discuss return to work.  Please advise.

## 2024-05-08 ENCOUNTER — Telehealth: Payer: Self-pay | Admitting: Surgical

## 2024-05-08 NOTE — Telephone Encounter (Signed)
 Duplicate message

## 2024-05-08 NOTE — Therapy (Signed)
 OUTPATIENT PHYSICAL THERAPY EVALUATION   Patient Name: Jaclyn Brooks MRN: [id number] DOB:Apr 19, 1961, 63 y.o., female Today's Date: 05/09/2024  END OF SESSION:  PT End of Session - 05/09/24 1458     Visit Number 1    Number of Visits 10    Date for PT Re-Evaluation 07/04/24    Progress Note Due on Visit 10    PT Start Time 0930    PT Stop Time 1015    PT Time Calculation (min) 45 min    Activity Tolerance Patient tolerated treatment well    Behavior During Therapy [facility] for tasks assessed/performed          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HCC)    Past Surgical History:  Procedure Laterality Date   CHOLECYSTECTOMY N/A 01/28/2015   Procedure: LAPAROSCOPIC CHOLECYSTECTOMY;  Surgeon: Oneil Budge Md, MD;  Location: [facility];  Service: General;  Laterality: N/A;   excision of bone spurs Bilateral    Big toes   renal aneurysm Right    right coil-and only has function of left kidney   TOTAL ABDOMINAL HYSTERECTOMY     X-STOP IMPLANTATION     Patient Active Problem List   Diagnosis Date Noted   Lumbar radiculopathy 03/19/2024   Anxiety 12/21/2023   Flank pain 09/19/2023   RUQ pain 09/19/2023   Chronic sinusitis 08/29/2023   Enlarged skin mole 08/29/2023   Dysphagia 07/23/2023   Reactive airway disease 07/06/2023   Loose bowel movements 07/06/2023   Primary osteoarthritis of left hip 05/05/2023   Dysuria 04/16/2023   Diarrhea of presumed infectious origin 02/01/2023   Intractable migraine with aura without status migrainosus 08/15/2022   Traumatic hematoma of right lower leg 07/13/2022   History of total abdominal hysterectomy 07/04/2022   GAD (generalized anxiety disorder) 04/28/2022   Caregiver stress 04/28/2022   Chronic left-sided low back pain without sciatica 04/13/2022   Contact  dermatitis 04/13/2022   Primary osteoarthritis of right knee 03/09/2022   Refused influenza vaccine 12/02/2021   Encounter for general adult medical examination with abnormal findings 11/30/2021   Epidermal cyst 11/30/2021   Acute recurrent frontal sinusitis 09/21/2021   Allergic rhinitis 08/29/2021   Nausea 08/27/2021   Hemorrhoids 07/07/2021   Gout 03/25/2021   Type 2 diabetes mellitus with other specified complication (HCC) 07/14/2020   Primary osteoarthritis of right foot 05/12/2020   GERD (gastroesophageal reflux disease) 05/31/2019   Solitary kidney, acquired 05/10/2019   Carotid arterial disease (HCC) 12/11/2018   COPD, 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/01/2018   CKD (chronic kidney disease) stage 3, GFR 30-59 ml/min (HCC) 10/01/2018   Aortic valve sclerosis 10/01/2018   Hypertension 09/11/2018    PCP: Tobie Suzzane POUR, MD   REFERRING PROVIDER: Shirly Carlin CROME, PA-C   REFERRING DIAG: M54.16 (ICD-10-CM) - Lumbar radiculopathy   Rationale for Evaluation and Treatment:  Rehabiliation  THERAPY DIAG:  Other low back pain  Pain in left hip  Muscle weakness (generalized)  Difficulty in walking, not elsewhere classified  ONSET DATE: End of April 2025   SUBJECTIVE:  SUBJECTIVE STATEMENT: Jaclyn Brooks is a 63 y.o. female who presents to the office reporting low back pain and left hip pain.  Patient states she still has some pain in the groin at times and  radicular pain down to the left leg. She says this happened at the end of April when she went to lift a beverage cooler at work. It is hard for her to find a position that is comfortable to be in.  PERTINENT HISTORY:  See above PMH  PAIN:  NPRS scale: 8/10 upon arrival Pain location:left hip now, she had shot in her low back which helped the  pain Pain description: comes and goes, tingling, dull Aggravating factors: walking,  Relieving factors: rest, meds   PRECAUTIONS: ,  None  RED FLAGS: None   WEIGHT BEARING RESTRICTIONS:  No  FALLS:  Has patient fallen in last 6 months? No   OCCUPATION:  Works for [employer] department  PLOF:  Independent  PATIENT GOALS:  Reduce pain  OBJECTIVE:  Note: Objective measures were completed at Evaluation unless otherwise noted.  DIAGNOSTIC FINDINGS:  Lumbar CT IMPRESSION: 1. Grade 1 degenerative anterolisthesis at L4-5 is worse with standing and exacerbated by flexion. 2. Moderate right subarticular and severe right foraminal stenosis at L4-5. 3. Moderate left foraminal narrowing at L4-5. 4. Rightward disc protrusion at L5-S1 likely contacts the traversing right S1 nerve roots. 5. Moderate foraminal stenosis bilaterally at L5-S1 is worse left than right. 6. Mild facet hypertrophy at L1-2 and L2-3 without significant stenosis. 7. 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 8. Asymmetric right-sided renal atrophy.  PATIENT SURVEYS:  Patient-Specific Activity Scoring Scheme  0 represents "unable to perform." 10 represents "able to perform at prior level. 0 1 2 3 4 5 6 7 8 9  10 (Date and Score)   Activity Eval     1. Walking 20 minutes 2     2. Sitting for one hour  2    3.     4.    5.    Score 2/10    Total score = sum of the activity scores/number of activities Minimum detectable change (90%CI) for average score = 2 points Minimum detectable change (90%CI) for single activity score = 3 points   GAIT: Distance walked: Can walk up to 20 minutes max before too much pain Assistive device utilized: None Level of assistance: Complete Independence Comments: antalgic gait on left, lower gait speed   LUMBAR ROM:   Active  AROM  eval  Flexion 50*  Extension 10*  Right lateral flexion 10  Left lateral flexion 10*  Right rotation 50%  Left  rotation 25%   (Blank rows = not tested) *notes pain   LOWER EXTREMITY MMT:    MMT Right eval Left eval  Hip flexion 5 4  Hip extension    Hip abduction 5 4  Hip adduction 5 4  Hip internal rotation    Hip external rotation    Knee flexion 5 5  Knee extension 5 5  Ankle dorsiflexion 5 5  Ankle plantarflexion    Ankle inversion    Ankle eversion     (Blank rows = not tested)    SPECIAL TESTS:  Lumbar Straight leg raise test: Positive and Slump test: Positive  TREATMENT DATE:  Eval HEP creation and review with demonstration and trial set preformed, see below for details Selfcare: Home TENS/Estim, walking program, using ice or heat may be beneficial Modalities: Electrical stimulation pre mod X 15 min to low back and left hip with intensity turned up to tolerance.    PATIENT EDUCATION: Education details: HEP, PT plan of care, selfcare Person educated: Patient Education method: Explanation, Demonstration, Verbal cues, and Handouts Education comprehension: verbalized understanding, further education recommended   HOME EXERCISE PROGRAM: Access Code: [email or web address] URL: [email or web address] Date: 05/09/2024 Prepared by: Redell Moose  Exercises - Standing Lumbar Extension at Wall - Forearms  - 2 x daily - 6 x weekly - 1 sets - 10 reps - 3 sec hold - Slump Stretch  - 2 x daily - 6 x weekly - 1 sets - 10 reps - 3 sec hold - supine IT band and piriformis stretch  - 2 x daily - 6 x weekly - 1 sets - 3 reps - 30 hold - Supine Bridge  - 2 x daily - 6 x weekly - 1 sets - 10 reps - 5 hold - Hooklying Lumbar Traction  - 2 x daily - 6 x weekly - 1 sets - 10 reps - 10 sec hold - Supine Lower Trunk Rotation  - 2 x daily - 6 x weekly - 1 sets - 5 reps - 10 sec hold  Patient Education - TENS Therapy - TENS UNIT - AUVON Dual Channel TENS  Unit  ASSESSMENT:  CLINICAL IMPRESSION: Patient referred to PT. Referring provider recommending One session this week, two sessions next week for left hip OA, and low back pain with left radiculopathy. She does still have limp and favors her left side with standing or walking. Patient will benefit from skilled PT to improve overall function and to address impairments and limitations listed below.  OBJECTIVE IMPAIRMENTS: decreased activity tolerance for ADL's, difficulty walking,  decreased endurance, decreased mobility, decreased ROM, decreased strength, impaired flexibility, impaired LE use, and pain.  ACTIVITY LIMITATIONS: bending, lifting, carry, reaching, locomotion, cleaning, community activity, driving, and or occupation  PERSONAL FACTORS: see above PMH  also affecting patient's functional outcome.  REHAB POTENTIAL: Good  CLINICAL DECISION MAKING: Stable/uncomplicated  EVALUATION COMPLEXITY: Low    GOALS: Short term PT Goals Target date: 06/06/2024   Pt will be I and compliant with HEP. Baseline:  Goal status: New Pt will decrease pain by 25% overall Baseline: Goal status: New  Long term PT goals Target date:07/04/2024   Pt will improve lumbar ROM to [facility] to improve functional mobility Baseline: Goal status: New Pt will improve  left leg strength to at least 4+/5 MMT to improve functional strength Baseline: Goal status: New Pt will improve Patient specific functional scale (PSFS) to at least 7/10 to show improved function level Baseline: Goal status: New Pt will reduce pain to overall less than 3/10 with usual activity and work activity. Baseline: Goal status: New Pt will be able to ambulate community distances at least 1000 ft WNL gait pattern without complaints Baseline: Goal status: New  PLAN: PT FREQUENCY: 1-2 times per week   PT DURATION: 6-8 weeks  PLANNED INTERVENTIONS (unless contraindicated): 97110-Therapeutic exercises, 97530- Therapeutic activity,  [id number]- Neuromuscular re-education, 97535- Self Care, 02859- Manual therapy, G0283- Electrical stimulation (unattended), 97035- Ultrasound, 02987- Traction (mechanical), and 97033- Ionotophoresis 4mg /ml Dexamethasone   PLAN FOR NEXT SESSION: review HEP, how was TENS and repeat if desire. NEXT MD VISIT: 05/24/24  Redell JONELLE Moose, PT,DPT 05/09/2024,  3:37 PM

## 2024-05-08 NOTE — Telephone Encounter (Signed)
 Patient called and returning a call again. CB#[phone]

## 2024-05-08 NOTE — Telephone Encounter (Signed)
 Form given to [name] to Fax to [employer] at [employer]

## 2024-05-08 NOTE — Telephone Encounter (Signed)
 Patient called and was returning [facility] call.CB#[phone]

## 2024-05-09 ENCOUNTER — Encounter: Payer: Self-pay | Admitting: Physical Therapy

## 2024-05-09 ENCOUNTER — Ambulatory Visit: Attending: Surgical | Admitting: Physical Therapy

## 2024-05-09 ENCOUNTER — Ambulatory Visit: Admitting: Surgical

## 2024-05-09 DIAGNOSIS — M5459 Other low back pain: Secondary | ICD-10-CM | POA: Insufficient documentation

## 2024-05-09 DIAGNOSIS — M5416 Radiculopathy, lumbar region: Secondary | ICD-10-CM | POA: Insufficient documentation

## 2024-05-09 DIAGNOSIS — M6281 Muscle weakness (generalized): Secondary | ICD-10-CM | POA: Insufficient documentation

## 2024-05-09 DIAGNOSIS — M25552 Pain in left hip: Secondary | ICD-10-CM | POA: Diagnosis not present

## 2024-05-09 DIAGNOSIS — R262 Difficulty in walking, not elsewhere classified: Secondary | ICD-10-CM | POA: Insufficient documentation

## 2024-05-13 ENCOUNTER — Ambulatory Visit: Admitting: Physical Therapy

## 2024-05-14 ENCOUNTER — Ambulatory Visit: Attending: Surgical | Admitting: Physical Therapy

## 2024-05-14 ENCOUNTER — Encounter: Payer: Self-pay | Admitting: Physical Therapy

## 2024-05-14 ENCOUNTER — Ambulatory Visit: Payer: Medicare Other | Admitting: Dermatology

## 2024-05-14 DIAGNOSIS — M25552 Pain in left hip: Secondary | ICD-10-CM | POA: Diagnosis not present

## 2024-05-14 DIAGNOSIS — M6281 Muscle weakness (generalized): Secondary | ICD-10-CM | POA: Diagnosis not present

## 2024-05-14 DIAGNOSIS — M5459 Other low back pain: Secondary | ICD-10-CM | POA: Diagnosis not present

## 2024-05-14 DIAGNOSIS — R262 Difficulty in walking, not elsewhere classified: Secondary | ICD-10-CM | POA: Diagnosis not present

## 2024-05-14 NOTE — Therapy (Signed)
 OUTPATIENT PHYSICAL THERAPY TREATMENT   Patient Name: AZARIE CORIZ MRN: [id number] DOB:09-06-1961, 63 y.o., female Today's Date: 05/14/2024  END OF SESSION:  PT End of Session - 05/14/24 1519     Visit Number 2    Number of Visits 10    Date for PT Re-Evaluation 07/04/24    Progress Note Due on Visit 10    PT Start Time 1505    PT Stop Time 1555    PT Time Calculation (min) 50 min    Activity Tolerance Patient tolerated treatment well    Behavior During Therapy [facility] for tasks assessed/performed           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HCC)    Past Surgical History:  Procedure Laterality Date   CHOLECYSTECTOMY N/A 01/28/2015   Procedure: LAPAROSCOPIC CHOLECYSTECTOMY;  Surgeon: Oneil Budge Md, MD;  Location: [facility];  Service: General;  Laterality: N/A;   excision of bone spurs Bilateral    Big toes   renal aneurysm Right    right coil-and only has function of left kidney   TOTAL ABDOMINAL HYSTERECTOMY     X-STOP IMPLANTATION     Patient Active Problem List   Diagnosis Date Noted   Lumbar radiculopathy 03/19/2024   Anxiety 12/21/2023   Flank pain 09/19/2023   RUQ pain 09/19/2023   Chronic sinusitis 08/29/2023   Enlarged skin mole 08/29/2023   Dysphagia 07/23/2023   Reactive airway disease 07/06/2023   Loose bowel movements 07/06/2023   Primary osteoarthritis of left hip 05/05/2023   Dysuria 04/16/2023   Diarrhea of presumed infectious origin 02/01/2023   Intractable migraine with aura without status migrainosus 08/15/2022   Traumatic hematoma of right lower leg 07/13/2022   History of total abdominal hysterectomy 07/04/2022   GAD (generalized anxiety disorder) 04/28/2022   Caregiver stress 04/28/2022   Chronic left-sided low back pain without sciatica 04/13/2022   Contact  dermatitis 04/13/2022   Primary osteoarthritis of right knee 03/09/2022   Refused influenza vaccine 12/02/2021   Encounter for general adult medical examination with abnormal findings 11/30/2021   Epidermal cyst 11/30/2021   Acute recurrent frontal sinusitis 09/21/2021   Allergic rhinitis 08/29/2021   Nausea 08/27/2021   Hemorrhoids 07/07/2021   Gout 03/25/2021   Type 2 diabetes mellitus with other specified complication (HCC) 07/14/2020   Primary osteoarthritis of right foot 05/12/2020   GERD (gastroesophageal reflux disease) 05/31/2019   Solitary kidney, acquired 05/10/2019   Carotid arterial disease (HCC) 12/11/2018   COPD, 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/01/2018   CKD (chronic kidney disease) stage 3, GFR 30-59 ml/min (HCC) 10/01/2018   Aortic valve sclerosis 10/01/2018   Hypertension 09/11/2018    PCP: Tobie Suzzane POUR, MD   REFERRING PROVIDER: Shirly Carlin CROME, PA-C   REFERRING DIAG: M54.16 (ICD-10-CM) - Lumbar radiculopathy   Rationale for Evaluation and Treatment:  Rehabiliation  THERAPY DIAG:  Other low back pain  Pain in left hip  Muscle weakness (generalized)  Difficulty in walking, not elsewhere classified  ONSET DATE: End of April 2025   SUBJECTIVE:  SUBJECTIVE STATEMENT: She states all the rain may be contributing to her pain, it is all down her left leg  PERTINENT HISTORY:  See above PMH  PAIN:  NPRS scale: 8/10 upon arrival Pain location:left hip now, she had shot in her low back which helped the pain Pain description: comes and goes, tingling, dull Aggravating factors: walking,  Relieving factors: rest, meds   PRECAUTIONS: ,  None  RED FLAGS: None   WEIGHT BEARING RESTRICTIONS:  No  FALLS:  Has patient fallen in last 6 months? No   OCCUPATION:  Works for [employer]  department  PLOF:  Independent  PATIENT GOALS:  Reduce pain  OBJECTIVE:  Note: Objective measures were completed at Evaluation unless otherwise noted.  DIAGNOSTIC FINDINGS:  Lumbar CT IMPRESSION: 1. Grade 1 degenerative anterolisthesis at L4-5 is worse with standing and exacerbated by flexion. 2. Moderate right subarticular and severe right foraminal stenosis at L4-5. 3. Moderate left foraminal narrowing at L4-5. 4. Rightward disc protrusion at L5-S1 likely contacts the traversing right S1 nerve roots. 5. Moderate foraminal stenosis bilaterally at L5-S1 is worse left than right. 6. Mild facet hypertrophy at L1-2 and L2-3 without significant stenosis. 7. 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 8. Asymmetric right-sided renal atrophy.  PATIENT SURVEYS:  Patient-Specific Activity Scoring Scheme  0 represents "unable to perform." 10 represents "able to perform at prior level. 0 1 2 3 4 5 6 7 8 9  10 (Date and Score)   Activity Eval     1. Walking 20 minutes 2     2. Sitting for one hour  2    3.     4.    5.    Score 2/10    Total score = sum of the activity scores/number of activities Minimum detectable change (90%CI) for average score = 2 points Minimum detectable change (90%CI) for single activity score = 3 points   GAIT: Distance walked: Can walk up to 20 minutes max before too much pain Assistive device utilized: None Level of assistance: Complete Independence Comments: antalgic gait on left, lower gait speed   LUMBAR ROM:   Active  AROM  eval  Flexion 50*  Extension 10*  Right lateral flexion 10  Left lateral flexion 10*  Right rotation 50%  Left rotation 25%   (Blank rows = not tested) *notes pain   LOWER EXTREMITY MMT:    MMT Right eval Left eval  Hip flexion 5 4  Hip extension    Hip abduction 5 4  Hip adduction 5 4  Hip internal rotation    Hip external rotation    Knee flexion 5 5  Knee extension 5 5  Ankle  dorsiflexion 5 5  Ankle plantarflexion    Ankle inversion    Ankle eversion     (Blank rows = not tested)    SPECIAL TESTS:  Lumbar Straight leg raise test: Positive and Slump test: Positive  TREATMENT DATE:  Eval Threrex: HEP review and education Standing lumbar extensions at wall 3 sec X 10 Seated slump stretch on left 3sec X 10 Supine piriformis stretch 30 sec X 2 on Rt and X 3 on left Supine bridges 3 sec X 10  Modalities: Electrical stimulation pre mod X 15 min to low back and left hip with intensity turned up to tolerance. Mechanical lumbar traction X 10 min 85-65#    PATIENT EDUCATION: Education details: HEP, PT plan of care, selfcare Person educated: Patient Education method: Explanation, Demonstration, Verbal cues, and Handouts Education comprehension: verbalized understanding, further education recommended   HOME EXERCISE PROGRAM: Access Code: [email or web address] URL: [email or web address] Date: 05/09/2024 Prepared by: Redell Moose  Exercises - Standing Lumbar Extension at Wall - Forearms  - 2 x daily - 6 x weekly - 1 sets - 10 reps - 3 sec hold - Slump Stretch  - 2 x daily - 6 x weekly - 1 sets - 10 reps - 3 sec hold - supine IT band and piriformis stretch  - 2 x daily - 6 x weekly - 1 sets - 3 reps - 30 hold - Supine Bridge  - 2 x daily - 6 x weekly - 1 sets - 10 reps - 5 hold - Hooklying Lumbar Traction  - 2 x daily - 6 x weekly - 1 sets - 10 reps - 10 sec hold - Supine Lower Trunk Rotation  - 2 x daily - 6 x weekly - 1 sets - 5 reps - 10 sec hold  Patient Education - TENS Therapy - TENS UNIT - AUVON Dual Channel TENS Unit  ASSESSMENT:  CLINICAL IMPRESSION: Electrical stimulation continued as she relays may have helped some last time. HEP was reviewed with education and tips given. I did try mechanical lumbar traction as well  to see if this helps relieve any of her radicular symptoms.   OBJECTIVE IMPAIRMENTS: decreased activity tolerance for ADL's, difficulty walking,  decreased endurance, decreased mobility, decreased ROM, decreased strength, impaired flexibility, impaired LE use, and pain.  ACTIVITY LIMITATIONS: bending, lifting, carry, reaching, locomotion, cleaning, community activity, driving, and or occupation  PERSONAL FACTORS: see above PMH  also affecting patient's functional outcome.  REHAB POTENTIAL: Good  CLINICAL DECISION MAKING: Stable/uncomplicated  EVALUATION COMPLEXITY: Low    GOALS: Short term PT Goals Target date: 06/06/2024   Pt will be I and compliant with HEP. Baseline:  Goal status: New Pt will decrease pain by 25% overall Baseline: Goal status: New  Long term PT goals Target date:07/04/2024   Pt will improve lumbar ROM to [facility] to improve functional mobility Baseline: Goal status: New Pt will improve  left leg strength to at least 4+/5 MMT to improve functional strength Baseline: Goal status: New Pt will improve Patient specific functional scale (PSFS) to at least 7/10 to show improved function level Baseline: Goal status: New Pt will reduce pain to overall less than 3/10 with usual activity and work activity. Baseline: Goal status: New Pt will be able to ambulate community distances at least 1000 ft WNL gait pattern without complaints Baseline: Goal status: New  PLAN: PT FREQUENCY: 1-2 times per week   PT DURATION: 6-8 weeks  PLANNED INTERVENTIONS (unless contraindicated): 97110-Therapeutic exercises, 97530- Therapeutic activity, [id number]- Neuromuscular re-education, 97535- Self Care, 02859- Manual therapy, [id number]- Electrical stimulation (unattended), 97035- Ultrasound, 02987- Traction (mechanical), and 97033- Ionotophoresis 4mg /ml Dexamethasone   PLAN FOR NEXT SESSION: how was traction? NEXT MD VISIT: 05/24/24  Redell JONELLE Moose, PT,DPT 05/14/2024, 3:48  PM

## 2024-05-15 ENCOUNTER — Encounter: Admitting: Physical Therapy

## 2024-05-19 ENCOUNTER — Other Ambulatory Visit: Payer: Self-pay | Admitting: Internal Medicine

## 2024-05-19 DIAGNOSIS — I10 Essential (primary) hypertension: Secondary | ICD-10-CM

## 2024-05-21 ENCOUNTER — Ambulatory Visit: Admitting: Physical Therapy

## 2024-05-24 ENCOUNTER — Ambulatory Visit: Admitting: Surgical

## 2024-05-24 DIAGNOSIS — M5416 Radiculopathy, lumbar region: Secondary | ICD-10-CM

## 2024-05-24 MED ORDER — METHOCARBAMOL 500 MG PO TABS: 500.0000 mg | ORAL_TABLET | Freq: Three times a day (TID) | ORAL | 1 refills | Status: DC | PRN

## 2024-05-25 ENCOUNTER — Encounter: Payer: Self-pay | Admitting: Surgical

## 2024-05-25 NOTE — Progress Notes (Signed)
 Follow-up Office Visit Note   Patient: Jaclyn Brooks           Date of Birth: 1961/07/16           MRN: [id number] Visit Date: 05/24/2024 Requested by: Tobie Suzzane POUR, MD [address] [place],  [place] [address] PCP: Tobie Suzzane POUR, MD  Subjective: Chief Complaint  Patient presents with   Other    Follow up to discuss work status as well as continued low back pain with left radicular pain. States she has had difficulty sleeping.     HPI: Jaclyn Brooks is a 63 y.o. female who returns to the office for follow-up visit.    Plan at last visit was: Plan at this time is return to work in 2 weeks after 3 sessions of PT. She will do 1 session of PT this week in [place] and then 2 sessions next week for left hip arthritis and low back pain with left leg radicular symptoms. Return to work on 05/20/2024 after she has had several sessions of PT and then she will follow-up as needed. Do think that it is in her best interest long-term to avoid overdoing it in terms of ambulation and stairs to avoid exacerbating her left hip arthritis. Also needs to avoid a lot of heavy lifting to avoid exacerbating her radicular symptoms.   Since then, patient notes she continues to have intermittent radicular pain down the left leg as well as groin/anterior thigh pain likely related to her hip arthritis.  She has been to 3 visits with PT in [place] with [facility] and feels this is going well.  She has some relief of her back pain from this.  Symptoms currently not bothering her enough to retry lumbar spine ESI or hip injection though she is considering this in the future if the pain gets worse.  Pain will occasionally wake her up from sleep at night.  Continues to have numbness and tingling.  Takes Tylenol  as needed which is not helpful.  Cannot take NSAIDs due to her history of CKD.  Did end up losing her job due to her former employer not working around her accommodations and letting her go.              ROS: All systems  reviewed are negative as they relate to the chief complaint within the history of present illness.  Patient denies fevers or chills.  Assessment & Plan: Visit Diagnoses:  1. Lumbar radiculopathy     Plan: Jaclyn Brooks is a 63 y.o. female who returns to the office for follow-up visit.  Plan from last visit was noted above in HPI.  They now return with continued radicular leg pain and hip pain from a combination of hip arthritis and her low back pathology.  After discussion, patient will continue with more physical therapy visits.  We will continue to advise maybe that she should avoid any employment opportunities that require heavy lifting or extended walking.  Sedentary work is in her best interest in reducing the likelihood of flareups of her orthopedic pathology.  Patient is currently looking for a new job.  Follow-Up Instructions: No follow-ups on file.   Orders:  Orders Placed This Encounter  Procedures   Ambulatory referral to Physical Therapy   Meds ordered this encounter  Medications   methocarbamol  (ROBAXIN ) 500 MG tablet    Sig: Take 1 tablet (500 mg total) by mouth every 8 (eight) hours as needed for muscle spasms.  Dispense:  30 tablet    Refill:  1      Procedures: No procedures performed   Clinical Data: No additional findings.  Objective: Vital Signs: There were no vitals taken for this visit.  Physical Exam:  Constitutional: Patient appears well-developed HEENT:  Head: Normocephalic Eyes:EOM are normal Neck: Normal range of motion Cardiovascular: Normal rate Pulmonary/chest: Effort normal Neurologic: Patient is alert Skin: Skin is warm Psychiatric: Patient has normal mood and affect  Ortho Exam: Ortho exam demonstrates decreased hip range of motion of the left hip.  Pain reproduces her groin and thigh pain.  She has intact hip flexion, quadricep, hamstring, dorsiflexion, plantarflexion strength rated 5/5.  No clonus noted bilaterally.  Specialty  Comments:  CT LUMBAR MYELOGRAM FINDINGS:   The lumbar spine is imaged from the midbody of T12 through to see S3-4. Grade 1 anterolisthesis at L4-5 measures 4 mm in the midline. Grade 1 anterolisthesis at L5-S1 measures 2.5 mm in midline.   Atherosclerotic calcifications are present in the aorta branch vessel. No aneurysms are present.   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 Asymmetric right-sided renal atrophy is present. Cholecystectomy is noted.   T11-12: Negative.   T12-L1: Normal disc signal and height is present. No focal protrusion or stenosis is present.   L1-2: Mild facet hypertrophy is present bilaterally. No focal disc protrusion or stenosis is present.   L2-3: Minimal facet hypertrophy is present. No focal disc protrusion or stenosis is present.   L3-4: Mild facet hypertrophy is worse right than left. No significant disc protrusion or stenosis present.   L4-5: A broad-based disc protrusion is present. Moderate facet hypertrophy and ligamentum flavum thickening is noted. Moderate right subarticular and severe right foraminal stenosis is present. Moderate left foraminal narrowing is present.   L5-S1: A rightward disc protrusion likely contacts the traversing right S1 nerve roots. Moderate foraminal stenosis is worse left than right. Moderate facet hypertrophy is present bilaterally, left greater than right.   IMPRESSION: 1. Grade 1 degenerative anterolisthesis at L4-5 is worse with standing and exacerbated by flexion. 2. Moderate right subarticular and severe right foraminal stenosis at L4-5. 3. Moderate left foraminal narrowing at L4-5. 4. Rightward disc protrusion at L5-S1 likely contacts the traversing right S1 nerve roots. 5. Moderate foraminal stenosis bilaterally at L5-S1 is worse left than right. 6. Mild facet hypertrophy at L1-2 and L2-3 without significant stenosis. 7. 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 8.  Asymmetric right-sided renal atrophy.     Electronically Signed   By: Lonni Necessary M.D.   On: 02/21/2024 12:07  Imaging: No results found.   PMFS History: Patient Active Problem List   Diagnosis Date Noted   Lumbar radiculopathy 03/19/2024   Anxiety 12/21/2023   Flank pain 09/19/2023   RUQ pain 09/19/2023   Chronic sinusitis 08/29/2023   Enlarged skin mole 08/29/2023   Dysphagia 07/23/2023   Reactive airway disease 07/06/2023   Loose bowel movements 07/06/2023   Primary osteoarthritis of left hip 05/05/2023   Dysuria 04/16/2023   Diarrhea of presumed infectious origin 02/01/2023   Intractable migraine with aura without status migrainosus 08/15/2022   Traumatic hematoma of right lower leg 07/13/2022   History of total abdominal hysterectomy 07/04/2022   GAD (generalized anxiety disorder) 04/28/2022   Caregiver stress 04/28/2022   Chronic left-sided low back pain without sciatica 04/13/2022   Contact dermatitis 04/13/2022   Primary osteoarthritis of right knee 03/09/2022   Refused influenza vaccine 12/02/2021   Encounter for  general adult medical examination with abnormal findings 11/30/2021   Epidermal cyst 11/30/2021   Acute recurrent frontal sinusitis 09/21/2021   Allergic rhinitis 08/29/2021   Nausea 08/27/2021   Hemorrhoids 07/07/2021   Gout 03/25/2021   Type 2 diabetes mellitus with other specified complication (HCC) 07/14/2020   Primary osteoarthritis of right foot 05/12/2020   GERD (gastroesophageal reflux disease) 05/31/2019   Solitary kidney, acquired 05/10/2019   Carotid arterial disease (HCC) 12/11/2018   COPD, 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/01/2018   CKD (chronic kidney disease) stage 3, GFR 30-59 ml/min (HCC) 10/01/2018   Aortic valve sclerosis 10/01/2018   Hypertension 09/11/2018   Past Medical History:  Diagnosis Date   Allergy    Asthma    Phreesia 11/25/2020   Chronic kidney disease    [employer]reesia 11/25/2020   Depression    Gout    Hyperlipidemia     Hypertension    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HCC)     Family History  Problem Relation Age of Onset   Hypertension Mother    Heart disease Mother        Cardiac Arrest    Hypertension Father    Cancer Father    Hypertension Sister    Cancer Brother        Bladder Cancer    Hypertension Brother    Hypertension Sister     Past Surgical History:  Procedure Laterality Date   CHOLECYSTECTOMY N/A 01/28/2015   Procedure: LAPAROSCOPIC CHOLECYSTECTOMY;  Surgeon: Oneil Budge Md, MD;  Location: [facility];  Service: General;  Laterality: N/A;   excision of bone spurs Bilateral    Big toes   renal aneurysm Right    right coil-and only has function of left kidney   TOTAL ABDOMINAL HYSTERECTOMY     X-STOP IMPLANTATION     Social History   Occupational History   Occupation: Retired  Tobacco Use   Smoking status: Former    Current packs/day: 0.00    Average packs/day: 0.3 packs/day for 20.0 years (5.0 ttl pk-yrs)    Types: Cigarettes    Start date: 1999    Quit date: 2019    Years since quitting: 6.6   Smokeless tobacco: Never  Vaping Use   Vaping status: Never Used  Substance and Sexual Activity   Alcohol use: Not Currently    Comment: occasionally   Drug use: No   Sexual activity: Yes    Birth control/protection: Surgical

## 2024-05-30 ENCOUNTER — Ambulatory Visit (HOSPITAL_COMMUNITY): Attending: Internal Medicine

## 2024-05-30 ENCOUNTER — Encounter (HOSPITAL_COMMUNITY)

## 2024-06-03 ENCOUNTER — Ambulatory Visit: Admitting: Physical Therapy

## 2024-06-03 ENCOUNTER — Encounter: Payer: Self-pay | Admitting: Physical Therapy

## 2024-06-03 DIAGNOSIS — M25552 Pain in left hip: Secondary | ICD-10-CM | POA: Diagnosis not present

## 2024-06-03 DIAGNOSIS — M6281 Muscle weakness (generalized): Secondary | ICD-10-CM

## 2024-06-03 DIAGNOSIS — M5459 Other low back pain: Secondary | ICD-10-CM

## 2024-06-03 DIAGNOSIS — R262 Difficulty in walking, not elsewhere classified: Secondary | ICD-10-CM

## 2024-06-03 NOTE — Therapy (Addendum)
 OUTPATIENT PHYSICAL THERAPY TREATMENT   Patient Name: Jaclyn Brooks MRN: [id number] DOB:Feb 14, 1961, 63 y.o., female Today's Date: 06/03/2024  END OF SESSION:  PT End of Session - 06/03/24 1413     Visit Number 3    Number of Visits 10    Date for PT Re-Evaluation 07/04/24    Authorization Type [employer] Medicare    Authorization Time Period 6 visits covered (05/13/2024-06/20/2024)    Authorization - Visit Number 3    Authorization - Number of Visits 6    Progress Note Due on Visit 10    PT Start Time 1415    PT Stop Time 1500    PT Time Calculation (min) 45 min    Activity Tolerance Patient tolerated treatment well    Behavior During Therapy [facility] for tasks assessed/performed           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HCC)    Past Surgical History:  Procedure Laterality Date   CHOLECYSTECTOMY N/A 01/28/2015   Procedure: LAPAROSCOPIC CHOLECYSTECTOMY;  Surgeon: Oneil Budge Md, MD;  Location: [facility];  Service: General;  Laterality: N/A;   excision of bone spurs Bilateral    Big toes   renal aneurysm Right    right coil-and only has function of left kidney   TOTAL ABDOMINAL HYSTERECTOMY     X-STOP IMPLANTATION     Patient Active Problem List   Diagnosis Date Noted   Lumbar radiculopathy 03/19/2024   Anxiety 12/21/2023   Flank pain 09/19/2023   RUQ pain 09/19/2023   Chronic sinusitis 08/29/2023   Enlarged skin mole 08/29/2023   Dysphagia 07/23/2023   Reactive airway disease 07/06/2023   Loose bowel movements 07/06/2023   Primary osteoarthritis of left hip 05/05/2023   Dysuria 04/16/2023   Diarrhea of presumed infectious origin 02/01/2023   Intractable migraine with aura without status migrainosus 08/15/2022   Traumatic hematoma of right lower leg 07/13/2022   History of total abdominal  hysterectomy 07/04/2022   GAD (generalized anxiety disorder) 04/28/2022   Caregiver stress 04/28/2022   Chronic left-sided low back pain without sciatica 04/13/2022   Contact dermatitis 04/13/2022   Primary osteoarthritis of right knee 03/09/2022   Refused influenza vaccine 12/02/2021   Encounter for general adult medical examination with abnormal findings 11/30/2021   Epidermal cyst 11/30/2021   Acute recurrent frontal sinusitis 09/21/2021   Allergic rhinitis 08/29/2021   Nausea 08/27/2021   Hemorrhoids 07/07/2021   Gout 03/25/2021   Type 2 diabetes mellitus with other specified complication (HCC) 07/14/2020   Primary osteoarthritis of right foot 05/12/2020   GERD (gastroesophageal reflux disease) 05/31/2019   Solitary kidney, acquired 05/10/2019   Carotid arterial disease (HCC) 12/11/2018   COPD, 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/01/2018   CKD (chronic kidney disease) stage 3, GFR 30-59 ml/min (HCC) 10/01/2018   Aortic valve sclerosis 10/01/2018   Hypertension 09/11/2018    PCP: Tobie Suzzane POUR, MD   REFERRING PROVIDER: Shirly Carlin CROME, PA-C   REFERRING DIAG: M54.16 (ICD-10-CM) - Lumbar radiculopathy   Rationale for Evaluation and Treatment:  Rehabiliation  THERAPY DIAG:  Other low back pain  Pain in left hip  Muscle weakness (generalized)  Difficulty in walking, not elsewhere classified  ONSET DATE: End of April 2025   SUBJECTIVE:  SUBJECTIVE STATEMENT: Pt states it is better than what it was. Has not been feeling it all the way to her foot. Mostly feeling it along her lateral thigh today. Pt reports she is currently on short term disability. Pt states she felt that the traction may have helped some.   PERTINENT HISTORY:  See above PMH  PAIN:  NPRS scale: 6/10 upon arrival Pain location:left  hip now, she had shot in her low back which helped the pain Pain description: comes and goes, tingling, dull Aggravating factors: walking,  Relieving factors: rest, meds   PRECAUTIONS: ,  None  RED FLAGS: None   WEIGHT BEARING RESTRICTIONS:  No  FALLS:  Has patient fallen in last 6 months? No   OCCUPATION:  Works for [employer] department  PLOF:  Independent  PATIENT GOALS:  Reduce pain  OBJECTIVE:  Note: Objective measures were completed at Evaluation unless otherwise noted.  DIAGNOSTIC FINDINGS:  Lumbar CT IMPRESSION: 1. Grade 1 degenerative anterolisthesis at L4-5 is worse with standing and exacerbated by flexion. 2. Moderate right subarticular and severe right foraminal stenosis at L4-5. 3. Moderate left foraminal narrowing at L4-5. 4. Rightward disc protrusion at L5-S1 likely contacts the traversing right S1 nerve roots. 5. Moderate foraminal stenosis bilaterally at L5-S1 is worse left than right. 6. Mild facet hypertrophy at L1-2 and L2-3 without significant stenosis. 7. 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 8. Asymmetric right-sided renal atrophy.  PATIENT SURVEYS:  Patient-Specific Activity Scoring Scheme  0 represents "unable to perform." 10 represents "able to perform at prior level. 0 1 2 3 4 5 6 7 8 9  10 (Date and Score)   Activity Eval     1. Walking 20 minutes 2     2. Sitting for one hour  2    3.     4.    5.    Score 2/10    Total score = sum of the activity scores/number of activities Minimum detectable change (90%CI) for average score = 2 points Minimum detectable change (90%CI) for single activity score = 3 points   GAIT: Distance walked: Can walk up to 20 minutes max before too much pain Assistive device utilized: None Level of assistance: Complete Independence Comments: antalgic gait on left, lower gait speed   LUMBAR ROM:   Active  AROM  eval  Flexion 50*  Extension 10*  Right lateral flexion 10   Left lateral flexion 10*  Right rotation 50%  Left rotation 25%   (Blank rows = not tested) *notes pain   LOWER EXTREMITY MMT:    MMT Right eval Left eval  Hip flexion 5 4  Hip extension    Hip abduction 5 4  Hip adduction 5 4  Hip internal rotation    Hip external rotation    Knee flexion 5 5  Knee extension 5 5  Ankle dorsiflexion 5 5  Ankle plantarflexion    Ankle inversion    Ankle eversion     (Blank rows = not tested)    SPECIAL TESTS:  Lumbar Straight leg raise test: Positive and Slump test: Positive  TREATMENT DATE:  06/03/24 Therex: Nustep L4 x 7 min UEs/LEs  Prone with one pillow under hips Knee flexion 2x10 Hip IR/ER 2x10 Glute set 2x10  Supine Hamstring stretch x 30 ITB stretch with strap x30 Quad set + SLR using strap for assist x5 Hip abd x10 with strap assist Diaphragmatic breaths x 2 min with moist heat for relaxation   Supine and seated PPT with diaphragmatic breaths x10 Standing L lateral hip shift into wall x10 Standing and walking posture maintaining equal weight shift   PATIENT EDUCATION: Education details: HEP, keeping weight shift equal if her leg isn't hurting too much Person educated: Patient Education method: Explanation, Demonstration, Verbal cues, and Handouts Education comprehension: verbalized understanding, further education recommended   HOME EXERCISE PROGRAM: Access Code: [email or web address] URL: [email or web address] Date: 06/03/2024 Prepared by: Trixie Maclaren April Earnie Starring  Exercises - Standing Lumbar Extension at [employer] - Forearms  - 2 x daily - 6 x weekly - 1 sets - 10 reps - 3 sec hold - Slump Stretch  - 2 x daily - 6 x weekly - 1 sets - 10 reps - 3 sec hold - supine IT band and piriformis stretch  - 2 x daily - 6 x weekly - 1 sets - 3 reps - 30 hold - Supine Lower Trunk Rotation  - 2 x  daily - 6 x weekly - 1 sets - 5 reps - 10 sec hold - Left Standing Lateral Shift Correction at Wall - Repetitions  - 1 x daily - 7 x weekly - 1 sets - 10 reps - Supine Posterior Pelvic Tilt  - 1 x daily - 7 x weekly - 2 sets - 10 reps - 3 sec hold - Supine Hip Abduction  - 1 x daily - 7 x weekly - 2 sets - 10 reps  ASSESSMENT:  CLINICAL IMPRESSION: Pt reports pain has been lessening. Feeling it mostly along her L lateral thigh. Treatment focused on initiating core strengthening and gentle hip strengthening/mobility. L hip motion remains more limited than R. Moist heat during supine exercises to help ease symptoms. Pt encouraged to maintain a more equal weight shift now that her symptoms are easing up some to maintain L LE strength.  OBJECTIVE IMPAIRMENTS: decreased activity tolerance for ADL's, difficulty walking,  decreased endurance, decreased mobility, decreased ROM, decreased strength, impaired flexibility, impaired LE use, and pain.  ACTIVITY LIMITATIONS: bending, lifting, carry, reaching, locomotion, cleaning, community activity, driving, and or occupation  PERSONAL FACTORS: see above PMH  also affecting patient's functional outcome.  REHAB POTENTIAL: Good  CLINICAL DECISION MAKING: Stable/uncomplicated  EVALUATION COMPLEXITY: Low    GOALS: Short term PT Goals Target date: 06/06/2024   Pt will be I and compliant with HEP. Baseline:  Goal status: New Pt will decrease pain by 25% overall Baseline: Goal status: New  Long term PT goals Target date:07/04/2024   Pt will improve lumbar ROM to [facility] to improve functional mobility Baseline: Goal status: New Pt will improve  left leg strength to at least 4+/5 MMT to improve functional strength Baseline: Goal status: New Pt will improve Patient specific functional scale (PSFS) to at least 7/10 to show improved function level Baseline: Goal status: New Pt will reduce pain to overall less than 3/10 with usual activity and work  activity. Baseline: Goal status: New Pt will be able to ambulate community distances at least 1000 ft WNL gait pattern without complaints Baseline: Goal status: New  PLAN: PT FREQUENCY: 1-2 times per week   PT  DURATION: 6-8 weeks  PLANNED INTERVENTIONS (unless contraindicated): 97110-Therapeutic exercises, 97530- Therapeutic activity, [id number]- Neuromuscular re-education, 97535- Self Care, [id number]- Manual therapy, [id number]- Electrical stimulation (unattended), [id number]- Ultrasound, 02987- Traction (mechanical), and 97033- Ionotophoresis 4mg /ml Dexamethasone   PLAN FOR NEXT SESSION: How has equal weight shifting been? Continue to work on gentle hip AROM/strengthening and core strengthening. STGs due next visit NEXT MD VISIT: 07/05/24  Syesha Thaw April Ma L Erum Cercone, PT,DPT 06/03/2024, 3:14 PM

## 2024-06-11 ENCOUNTER — Other Ambulatory Visit: Payer: Self-pay | Admitting: Internal Medicine

## 2024-06-11 DIAGNOSIS — I10 Essential (primary) hypertension: Secondary | ICD-10-CM

## 2024-06-24 ENCOUNTER — Ambulatory Visit: Attending: Surgical | Admitting: Physical Therapy

## 2024-06-24 ENCOUNTER — Encounter: Payer: Self-pay | Admitting: Physical Therapy

## 2024-06-24 DIAGNOSIS — R262 Difficulty in walking, not elsewhere classified: Secondary | ICD-10-CM | POA: Diagnosis not present

## 2024-06-24 DIAGNOSIS — M5459 Other low back pain: Secondary | ICD-10-CM | POA: Insufficient documentation

## 2024-06-24 DIAGNOSIS — M25552 Pain in left hip: Secondary | ICD-10-CM | POA: Diagnosis not present

## 2024-06-24 DIAGNOSIS — M6281 Muscle weakness (generalized): Secondary | ICD-10-CM | POA: Diagnosis not present

## 2024-06-24 NOTE — Therapy (Signed)
 OUTPATIENT PHYSICAL THERAPY TREATMENT   Patient Name: Jaclyn Brooks MRN: [id number] DOB:1961/08/08, 63 y.o., female Today's Date: 06/24/2024  END OF SESSION:  PT End of Session - 06/24/24 1505     Visit Number 4    Number of Visits 10    Date for PT Re-Evaluation 07/04/24    Authorization Type [employer] Medicare    Authorization Time Period 6 visits covered (05/13/2024-06/20/2024)    Authorization - Visit Number 4    Authorization - Number of Visits 6    Progress Note Due on Visit 10    PT Start Time 1505    PT Stop Time 1545    PT Time Calculation (min) 40 min    Activity Tolerance Patient tolerated treatment well    Behavior During Therapy [facility] for tasks assessed/performed            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HCC)    Past Surgical History:  Procedure Laterality Date   CHOLECYSTECTOMY N/A 01/28/2015   Procedure: LAPAROSCOPIC CHOLECYSTECTOMY;  Surgeon: Oneil Budge Md, MD;  Location: [facility];  Service: General;  Laterality: N/A;   excision of bone spurs Bilateral    Big toes   renal aneurysm Right    right coil-and only has function of left kidney   TOTAL ABDOMINAL HYSTERECTOMY     X-STOP IMPLANTATION     Patient Active Problem List   Diagnosis Date Noted   Lumbar radiculopathy 03/19/2024   Anxiety 12/21/2023   Flank pain 09/19/2023   RUQ pain 09/19/2023   Chronic sinusitis 08/29/2023   Enlarged skin mole 08/29/2023   Dysphagia 07/23/2023   Reactive airway disease 07/06/2023   Loose bowel movements 07/06/2023   Primary osteoarthritis of left hip 05/05/2023   Dysuria 04/16/2023   Diarrhea of presumed infectious origin 02/01/2023   Intractable migraine with aura without status migrainosus 08/15/2022   Traumatic hematoma of right lower leg 07/13/2022   History of total abdominal  hysterectomy 07/04/2022   GAD (generalized anxiety disorder) 04/28/2022   Caregiver stress 04/28/2022   Chronic left-sided low back pain without sciatica 04/13/2022   Contact dermatitis 04/13/2022   Primary osteoarthritis of right knee 03/09/2022   Refused influenza vaccine 12/02/2021   Encounter for general adult medical examination with abnormal findings 11/30/2021   Epidermal cyst 11/30/2021   Acute recurrent frontal sinusitis 09/21/2021   Allergic rhinitis 08/29/2021   Nausea 08/27/2021   Hemorrhoids 07/07/2021   Gout 03/25/2021   Type 2 diabetes mellitus with other specified complication (HCC) 07/14/2020   Primary osteoarthritis of right foot 05/12/2020   GERD (gastroesophageal reflux disease) 05/31/2019   Solitary kidney, acquired 05/10/2019   Carotid arterial disease (HCC) 12/11/2018   COPD, 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/01/2018   CKD (chronic kidney disease) stage 3, GFR 30-59 ml/min (HCC) 10/01/2018   Aortic valve sclerosis 10/01/2018   Hypertension 09/11/2018    PCP: Tobie Suzzane POUR, MD   REFERRING PROVIDER: Shirly Carlin CROME, PA-C   REFERRING DIAG: M54.16 (ICD-10-CM) - Lumbar radiculopathy   Rationale for Evaluation and Treatment:  Rehabiliation  THERAPY DIAG:  Other low back pain  Pain in left hip  Muscle weakness (generalized)  Difficulty in walking, not elsewhere classified  ONSET DATE: End of April 2025  SUBJECTIVE:                                                                                                                                                                                           SUBJECTIVE STATEMENT: States she did not have as good of a week last week. Not sure what happened -- Monday until today she has been having increased pain. Tried to do her exercises and it seemed to aggravate things. Had good days the week before where she almost had no pain.   PERTINENT HISTORY:  See above PMH  PAIN:  NPRS scale: 8/10 upon arrival Pain  location:left hip now, she had shot in her low back which helped the pain Pain description: comes and goes, tingling, dull Aggravating factors: walking,  Relieving factors: rest, meds   PRECAUTIONS: ,  None  RED FLAGS: None   WEIGHT BEARING RESTRICTIONS:  No  FALLS:  Has patient fallen in last 6 months? No   OCCUPATION:  Works for [employer] department  PLOF:  Independent  PATIENT GOALS:  Reduce pain  OBJECTIVE:  Note: Objective measures were completed at Evaluation unless otherwise noted.  DIAGNOSTIC FINDINGS:  Lumbar CT IMPRESSION: 1. Grade 1 degenerative anterolisthesis at L4-5 is worse with standing and exacerbated by flexion. 2. Moderate right subarticular and severe right foraminal stenosis at L4-5. 3. Moderate left foraminal narrowing at L4-5. 4. Rightward disc protrusion at L5-S1 likely contacts the traversing right S1 nerve roots. 5. Moderate foraminal stenosis bilaterally at L5-S1 is worse left than right. 6. Mild facet hypertrophy at L1-2 and L2-3 without significant stenosis. 7. 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 8. Asymmetric right-sided renal atrophy.  PATIENT SURVEYS:  Patient-Specific Activity Scoring Scheme  0 represents "unable to perform." 10 represents "able to perform at prior level. 0 1 2 3 4 5 6 7 8 9  10 (Date and Score)   Activity Eval     1. Walking 20 minutes 2     2. Sitting for one hour  2    3.     4.    5.    Score 2/10    Total score = sum of the activity scores/number of activities Minimum detectable change (90%CI) for average score = 2 points Minimum detectable change (90%CI) for single activity score = 3 points   GAIT: Distance walked: Can walk up to 20 minutes max before too much pain Assistive device utilized: None Level of assistance: Complete Independence Comments: antalgic gait on left, lower gait speed   LUMBAR ROM:   Active  AROM  eval  Flexion 50*  Extension 10*  Right lateral  flexion 10  Left lateral flexion 10*  Right rotation 50%  Left rotation 25%   (Blank rows = not tested) *notes pain   LOWER EXTREMITY MMT:    MMT Right eval Left eval  Hip flexion 5 4  Hip extension    Hip abduction 5 4  Hip adduction 5 4  Hip internal rotation    Hip external rotation    Knee flexion 5 5  Knee extension 5 5  Ankle dorsiflexion 5 5  Ankle plantarflexion    Ankle inversion    Ankle eversion     (Blank rows = not tested)    SPECIAL TESTS:  Lumbar Straight leg raise test: Positive and Slump test: Positive                                                                                                                             TREATMENT DATE:  06/24/24 Therex: Nustep L4 x 6 min UEs/LEs Standing lumbar ext against wall 2x10 Supine hamstring stretch x30 Supine ITB stretch with strap x30 Supine LTR 5x10  Nmed: Supine clamshell resisted iso green TB 10x5 Supine bridge green TB around knees 10x5 Supine pelvic tilt 2x10 Supine PPT + marching x5 Sidelying hip ext x10 to work on glute strengthening and TFL inhibition  Manual therapy: STM & TPR TFL and hip flexor   06/03/24 Therex: Nustep L4 x 7 min UEs/LEs  Prone with one pillow under hips Knee flexion 2x10 Hip IR/ER 2x10 Glute set 2x10  Supine Hamstring stretch x 30 ITB stretch with strap x30 Quad set + SLR using strap for assist x5 Hip abd x10 with strap assist Diaphragmatic breaths x 2 min with moist heat for relaxation   Supine and seated PPT with diaphragmatic breaths x10 Standing L lateral hip shift into wall x10 Standing and walking posture maintaining equal weight shift   PATIENT EDUCATION: Education details: HEP, keeping weight shift equal if her leg isn't hurting too much Person educated: Patient Education method: Explanation, Demonstration, Verbal cues, and Handouts Education comprehension: verbalized understanding, further education recommended   HOME EXERCISE  PROGRAM: Access Code: [email or web address] URL: [email or web address] Date: 06/03/2024 Prepared by: Braylee Bosher April Earnie Starring  Exercises - Standing Lumbar Extension at [employer] - Forearms  - 2 x daily - 6 x weekly - 1 sets - 10 reps - 3 sec hold - Slump Stretch  - 2 x daily - 6 x weekly - 1 sets - 10 reps - 3 sec hold - supine IT band and piriformis stretch  - 2 x daily - 6 x weekly - 1 sets - 3 reps - 30 hold - Supine Lower Trunk Rotation  - 2 x daily - 6 x weekly - 1 sets - 5 reps - 10 sec hold - Left Standing Lateral Shift Correction at Wall - Repetitions  - 1 x daily - 7 x weekly - 1 sets - 10 reps - Supine Posterior Pelvic Tilt  - 1 x  daily - 7 x weekly - 2 sets - 10 reps - 3 sec hold - Supine Hip Abduction  - 1 x daily - 7 x weekly - 2 sets - 10 reps  ASSESSMENT:  CLINICAL IMPRESSION: Pt comes in with increased pain and symptoms. Eased a bit after performing lumbar extension in standing. Continued to work on hip and core stability this session. Pt able to tolerate well without increase in pain. Has not been able to do HEP as consistently this past week due to pain. Pain had been better controlled prior to this past week so STGs only partially met. Pt will benefit from continued PT.   OBJECTIVE IMPAIRMENTS: decreased activity tolerance for ADL's, difficulty walking,  decreased endurance, decreased mobility, decreased ROM, decreased strength, impaired flexibility, impaired LE use, and pain.  ACTIVITY LIMITATIONS: bending, lifting, carry, reaching, locomotion, cleaning, community activity, driving, and or occupation  PERSONAL FACTORS: see above PMH  also affecting patient's functional outcome.  REHAB POTENTIAL: Good  CLINICAL DECISION MAKING: Stable/uncomplicated  EVALUATION COMPLEXITY: Low    GOALS: Short term PT Goals Target date: 06/06/2024   Pt will be I and compliant with HEP. Baseline:  Goal status: PARTIALLY MET 06/24/24 Pt will decrease pain by 25%  overall Baseline: Goal status: IN PROGRESS 06/24/24  Long term PT goals Target date:07/04/2024   Pt will improve lumbar ROM to [facility] to improve functional mobility Baseline: Goal status: New Pt will improve  left leg strength to at least 4+/5 MMT to improve functional strength Baseline: Goal status: New Pt will improve Patient specific functional scale (PSFS) to at least 7/10 to show improved function level Baseline: Goal status: New Pt will reduce pain to overall less than 3/10 with usual activity and work activity. Baseline: Goal status: New Pt will be able to ambulate community distances at least 1000 ft WNL gait pattern without complaints Baseline: Goal status: New  PLAN: PT FREQUENCY: 1-2 times per week   PT DURATION: 6-8 weeks  PLANNED INTERVENTIONS (unless contraindicated): 97110-Therapeutic exercises, 97530- Therapeutic activity, [id number]- Neuromuscular re-education, 97535- Self Care, [id number]- Manual therapy, [id number]- Electrical stimulation (unattended), 97035- Ultrasound, 02987- Traction (mechanical), and 97033- Ionotophoresis 4mg /ml Dexamethasone   PLAN FOR NEXT SESSION: How has equal weight shifting been? Continue to work on gentle hip AROM/strengthening and core strengthening.  NEXT MD VISIT: 07/05/24  Otisha Spickler April Ma L Tlaloc Taddei, PT,DPT 06/24/2024, 3:05 PM

## 2024-06-28 ENCOUNTER — Telehealth: Payer: Self-pay | Admitting: Surgical

## 2024-06-28 NOTE — Telephone Encounter (Signed)
 Received vm from patient checking on forms. Callback [phone].

## 2024-07-01 NOTE — Telephone Encounter (Signed)
 I am honestly not sure what forms she is referring to.  I filled out several forms for her in the past.  At this point, I do not really see anything changing aside from the recommendation that she stick with some type of work that is more sedentary and involves less walking/lifting to avoid aggravating her hip/back pains.

## 2024-07-03 NOTE — Telephone Encounter (Signed)
 I have tried to reach patient. If she returns call today, please send to [place] office at [phone].

## 2024-07-05 ENCOUNTER — Ambulatory Visit: Admitting: Surgical

## 2024-07-05 ENCOUNTER — Telehealth: Payer: Self-pay

## 2024-07-05 DIAGNOSIS — M5416 Radiculopathy, lumbar region: Secondary | ICD-10-CM | POA: Diagnosis not present

## 2024-07-05 NOTE — Telephone Encounter (Signed)
 Patient has appointment with Herlene this afternoon. Will discuss paperwork at time of appointment.

## 2024-07-05 NOTE — Telephone Encounter (Signed)
 Work note from today's visit with [facility] faxed to [facility] per pt request

## 2024-07-06 ENCOUNTER — Encounter: Payer: Self-pay | Admitting: Surgical

## 2024-07-06 NOTE — Progress Notes (Signed)
 Follow-up Office Visit Note   Patient: Jaclyn Brooks           Date of Birth: 15-Feb-1961           MRN: [id number] Visit Date: 07/05/2024 Requested by: Tobie Suzzane POUR, MD [address] [place],  [place] [address] PCP: Tobie Suzzane POUR, MD  Subjective: Chief Complaint  Patient presents with   Lower Back - Pain, Follow-up   Left Knee - Pain, Follow-up    HPI: Jaclyn Brooks is a 63 y.o. female who returns to the office for follow-up visit.    Plan at last visit was: Jaclyn Brooks is a 63 y.o. female who returns to the office for follow-up visit.  Plan from last visit was noted above in HPI.  They now return with continued radicular leg pain and hip pain from a combination of hip arthritis and her low back pathology.  After discussion, patient will continue with more physical therapy visits.  We will continue to advise maybe that she should avoid any employment opportunities that require heavy lifting or extended walking.  Sedentary work is in her best interest in reducing the likelihood of flareups of her orthopedic pathology.  Patient is currently looking for a new job.   Since then, patient notes she has been to physical therapy in [place] primarily with April and this has been going well.  She feels that her low back pain is actually improved compared with last visit even just a month ago.  Particularly, the massage therapy that she got from April was quite helpful.  She still complains of continued low back pain however.  She also has radicular left leg pain that she would like to try lumbar spine ESI for again with Dr. Eldonna.  Has some groin pain but not bothering her enough for hip injection.  Also has continued lateral knee pain consistent with her lateral compartment arthritis but again not bothering her for injection.  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              ROS: All systems reviewed are negative  as they relate to the chief complaint within the history of present illness.  Patient denies fevers or chills.  Assessment & Plan: Visit Diagnoses:  1. Lumbar radiculopathy     Plan: Jaclyn Brooks is a 63 y.o. female who returns to the office for follow-up visit.  Plan from last visit was noted above in HPI.  They now return with continued low back pain that has somewhat improved with physical therapy.  She is planning to continue with physical therapy.  We will also set her up for lumbar spine ESI.  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  Follow-up in 6 weeks prn  Follow-Up Instructions: Return in about 6 weeks (around 08/16/2024).   Orders:  Orders Placed This Encounter  Procedures   Ambulatory referral to Physical Medicine Rehab   No orders of the defined types were placed in this encounter.     Procedures: No procedures performed   Clinical Data: No additional findings.  Objective: Vital Signs: There were no  vitals taken for this visit.  Physical Exam:  Constitutional: Patient appears well-developed HEENT:  Head: Normocephalic Eyes:EOM are normal Neck: Normal range of motion Cardiovascular: Normal rate Pulmonary/chest: Effort normal Neurologic: Patient is alert Skin: Skin is warm Psychiatric: Patient has normal mood and affect  Ortho Exam: Ortho exam demonstrates intact hip flexion, quadricep, hamstring, dorsiflexion, plantarflexion, EHL of the left lower extremity.  She has trace effusion noted in the left knee but nothing significant.  She has tenderness over the lateral joint line of the left knee.  No tenderness over the medial joint line.  She has no cellulitis or skin changes noted overlying the left knee.  No calf  tenderness.  Palpable DP pulse of the left lower extremity.  Specialty Comments:  CT LUMBAR MYELOGRAM FINDINGS:   The lumbar spine is imaged from the midbody of T12 through to see S3-4. Grade 1 anterolisthesis at L4-5 measures 4 mm in the midline. Grade 1 anterolisthesis at L5-S1 measures 2.5 mm in midline.   Atherosclerotic calcifications are present in the aorta branch vessel. No aneurysms are present.   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 Asymmetric right-sided renal atrophy is present. Cholecystectomy is noted.   T11-12: Negative.   T12-L1: Normal disc signal and height is present. No focal protrusion or stenosis is present.   L1-2: Mild facet hypertrophy is present bilaterally. No focal disc protrusion or stenosis is present.   L2-3: Minimal facet hypertrophy is present. No focal disc protrusion or stenosis is present.   L3-4: Mild facet hypertrophy is worse right than left. No significant disc protrusion or stenosis present.   L4-5: A broad-based disc protrusion is present. Moderate facet hypertrophy and ligamentum flavum thickening is noted. Moderate right subarticular and severe right foraminal stenosis is present. Moderate left foraminal narrowing is present.   L5-S1: A rightward disc protrusion likely contacts the traversing right S1 nerve roots. Moderate foraminal stenosis is worse left than right. Moderate facet hypertrophy is present bilaterally, left greater than right.   IMPRESSION: 1. Grade 1 degenerative anterolisthesis at L4-5 is worse with standing and exacerbated by flexion. 2. Moderate right subarticular and severe right foraminal stenosis at L4-5. 3. Moderate left foraminal narrowing at L4-5. 4. Rightward disc protrusion at L5-S1 likely contacts the traversing right S1 nerve roots. 5. Moderate foraminal stenosis bilaterally at L5-S1 is worse left than right. 6. Mild facet hypertrophy at L1-2 and L2-3 without  significant stenosis. 7. 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 8. Asymmetric right-sided renal atrophy.     Electronically Signed   By: Lonni Necessary M.D.   On: 02/21/2024 12:07  Imaging: No results found.   PMFS History: Patient Active Problem List   Diagnosis Date Noted   Lumbar radiculopathy 03/19/2024   Anxiety 12/21/2023   Flank pain 09/19/2023   RUQ pain 09/19/2023   Chronic sinusitis 08/29/2023   Enlarged skin mole 08/29/2023   Dysphagia 07/23/2023   Reactive airway disease 07/06/2023   Loose bowel movements 07/06/2023   Primary osteoarthritis of left hip 05/05/2023   Dysuria 04/16/2023   Diarrhea of presumed infectious origin 02/01/2023   Intractable migraine with aura without status migrainosus 08/15/2022   Traumatic hematoma of right lower leg 07/13/2022   History of total abdominal hysterectomy 07/04/2022   GAD (generalized anxiety disorder) 04/28/2022   Caregiver stress 04/28/2022   Chronic left-sided low back pain without sciatica 04/13/2022   Contact dermatitis 04/13/2022   Primary osteoarthritis of right knee 03/09/2022   Refused influenza vaccine  12/02/2021   Encounter for general adult medical examination with abnormal findings 11/30/2021   Epidermal cyst 11/30/2021   Acute recurrent frontal sinusitis 09/21/2021   Allergic rhinitis 08/29/2021   Nausea 08/27/2021   Hemorrhoids 07/07/2021   Gout 03/25/2021   Type 2 diabetes mellitus with other specified complication (HCC) 07/14/2020   Primary osteoarthritis of right foot 05/12/2020   GERD (gastroesophageal reflux disease) 05/31/2019   Solitary kidney, acquired 05/10/2019   Carotid arterial disease 12/11/2018   COPD, 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/01/2018   CKD (chronic kidney disease) stage 3, GFR 30-59 ml/min (HCC) 10/01/2018   Aortic valve sclerosis 10/01/2018   Hypertension 09/11/2018   Past Medical History:  Diagnosis Date   Allergy    Asthma    Phreesia 11/25/2020   Chronic kidney  disease    Phreesia 11/25/2020   Depression    Gout    Hyperlipidemia    Hypertension    Renal arterial aneurysm     Family History  Problem Relation Age of Onset   Hypertension Mother    Heart disease Mother        Cardiac Arrest    Hypertension Father    Cancer Father    Hypertension Sister    Cancer Brother        Bladder Cancer    Hypertension Brother    Hypertension Sister     Past Surgical History:  Procedure Laterality Date   CHOLECYSTECTOMY N/A 01/28/2015   Procedure: LAPAROSCOPIC CHOLECYSTECTOMY;  Surgeon: Oneil Budge Md, MD;  Location: [facility];  Service: General;  Laterality: N/A;   excision of bone spurs Bilateral    Big toes   renal aneurysm Right    right coil-and only has function of left kidney   TOTAL ABDOMINAL HYSTERECTOMY     X-STOP IMPLANTATION     Social History   Occupational History   Occupation: Retired  Tobacco Use   Smoking status: Former    Current packs/day: 0.00    Average packs/day: 0.3 packs/day for 20.0 years (5.0 ttl pk-yrs)    Types: Cigarettes    Start date: 1999    Quit date: 2019    Years since quitting: 6.7   Smokeless tobacco: Never  Vaping Use   Vaping status: Never Used  Substance and Sexual Activity   Alcohol use: Not Currently    Comment: occasionally   Drug use: No   Sexual activity: Yes    Birth control/protection: Surgical

## 2024-07-08 ENCOUNTER — Ambulatory Visit (INDEPENDENT_AMBULATORY_CARE_PROVIDER_SITE_OTHER): Admitting: Internal Medicine

## 2024-07-08 ENCOUNTER — Encounter: Payer: Self-pay | Admitting: Internal Medicine

## 2024-07-08 VITALS — BP 138/80 | HR 51 | Ht 66.0 in | Wt 181.4 lb

## 2024-07-08 DIAGNOSIS — N1832 Chronic kidney disease, stage 3b: Secondary | ICD-10-CM | POA: Diagnosis not present

## 2024-07-08 DIAGNOSIS — J449 Chronic obstructive pulmonary disease, unspecified: Secondary | ICD-10-CM | POA: Diagnosis not present

## 2024-07-08 DIAGNOSIS — E1169 Type 2 diabetes mellitus with other specified complication: Secondary | ICD-10-CM | POA: Diagnosis not present

## 2024-07-08 DIAGNOSIS — K219 Gastro-esophageal reflux disease without esophagitis: Secondary | ICD-10-CM | POA: Diagnosis not present

## 2024-07-08 DIAGNOSIS — B370 Candidal stomatitis: Secondary | ICD-10-CM | POA: Insufficient documentation

## 2024-07-08 DIAGNOSIS — M5416 Radiculopathy, lumbar region: Secondary | ICD-10-CM

## 2024-07-08 DIAGNOSIS — Z905 Acquired absence of kidney: Secondary | ICD-10-CM | POA: Diagnosis not present

## 2024-07-08 DIAGNOSIS — I10 Essential (primary) hypertension: Secondary | ICD-10-CM

## 2024-07-08 MED ORDER — CARTIA XT 240 MG PO CP24: 240.0000 mg | ORAL_CAPSULE | Freq: Every day | ORAL | 3 refills | Status: AC

## 2024-07-08 MED ORDER — TRAMADOL HCL 50 MG PO TABS: 50.0000 mg | ORAL_TABLET | Freq: Two times a day (BID) | ORAL | 0 refills | Status: AC | PRN

## 2024-07-08 MED ORDER — HYDROCHLOROTHIAZIDE 25 MG PO TABS: 25.0000 mg | ORAL_TABLET | Freq: Every day | ORAL | 3 refills | Status: AC

## 2024-07-08 MED ORDER — ATENOLOL 50 MG PO TABS
50.0000 mg | ORAL_TABLET | Freq: Every day | ORAL | 3 refills | Status: AC
Start: 2024-07-08 — End: ?

## 2024-07-08 MED ORDER — NYSTATIN 100000 UNIT/ML MT SUSP: 5.0000 mL | Freq: Four times a day (QID) | OROMUCOSAL | 0 refills | Status: AC

## 2024-07-08 MED ORDER — ALBUTEROL SULFATE HFA 108 (90 BASE) MCG/ACT IN AERS
1.0000 | INHALATION_SPRAY | Freq: Four times a day (QID) | RESPIRATORY_TRACT | 2 refills | Status: AC | PRN
Start: 2024-07-08 — End: ?

## 2024-07-08 NOTE — Assessment & Plan Note (Signed)
 Whitish patch on tongue likely due to oral candidiasis Nystatin swish and swallow prescribed Advised to rinse mouth after each use of inhaled corticosteroid

## 2024-07-08 NOTE — Assessment & Plan Note (Signed)
 BP Readings from Last 1 Encounters:  07/08/24 138/80   Well-controlled with Atenolol , Cartia  and HCTZ Recent stress has caused fluctuation in her blood pressure Did not tolerate Hydralazine in the past Needs to cut down soft drink intake Counseled for compliance with the medications Advised DASH diet and moderate exercise/walking, at least 150 mins/week

## 2024-07-08 NOTE — Assessment & Plan Note (Addendum)
 Refilled Dexilant  - has been very expensive, but she does not have optimum effect with other PPI Has tried Omeprazole, Pantoprazole  without relief Her epigastric pain can also likely be due to GERD/gastritis Referred to [facility] pharmacist for assistance with Dexilant 

## 2024-07-08 NOTE — Assessment & Plan Note (Signed)
 Well controlled with Symbicort , but was expensive - had to switch to Advair, needs to remain compliant Albuterol  as needed

## 2024-07-08 NOTE — Assessment & Plan Note (Addendum)
 Last BMP showed GFR 39 in 03/25, usually stays around 35 Follows up with Nephrologist - Dr. Jerrye, last visit note reviewed Avoid nephrotoxic agents Maintain adequate hydration

## 2024-07-08 NOTE — Assessment & Plan Note (Signed)
Has only 1 for functioning kidney, has  atrophy of right kidney Followed by nephrology

## 2024-07-08 NOTE — Progress Notes (Signed)
 Established Patient Office Visit  Subjective:  Patient ID: Jaclyn Brooks, female    DOB: 31-Dec-1960  Age: 63 y.o. MRN: [id number]  CC:  Chief Complaint  Patient presents with   Hypertension    6 month f/u    Anxiety    6 month f/u     HPI [name] is a 63 y.o. 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  HTN: Her BP was wnl today. She denies any headache, dizziness, chest pain, dyspnea or palpitations currently.  She is currently taking atenolol  50 mg QD, Cartia  240 mg QD and hydrochlorothiazide  25 mg QD.  Her heart rate remains in 50s.  She did not tolerate hydralazine that was prescribed by nephrology in the past.  CKD: She follows up with [facility].  Denies any dysuria, hematuria or urinary hesitancy or resistance currently.  She admits that she used to take few [employer] every day.  She has chronic nasal congestion, postnasal drip and sinus pressure related headache.  Of note, she takes Singulair  and Zyrtec  PRN for her allergies currently.  She has tried OTC antihistaminics without much relief.  GERD: She has chronic acid reflux and epigastric discomfort.  She has tried omeprazole and pantoprazole  without much relief.  She tolerates Dexilant  well, but has difficulty obtaining it at times due to cost concern.  Asthmatic bronchitis: She was given Advair in the last visit due to cost concern with Symbicort .  She has albuterol  as rescue inhaler as well.  Denies any recent worsening of dyspnea or wheezing.  Lumbar radiculopathy: She has chronic low back pain, radiating to LLE.  She is followed by [facility].  She continues physical therapy.  She was given Robaxin  by orthopedic surgery clinic, but had urinary incontinence with it.  She has taken tramadol  with adequate relief in the past.  She  has whitish patch on the tongue for the last 2 weeks.  Denies dysphagia or odynophagia.    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Past Surgical History:  Procedure Laterality Date   CHOLECYSTECTOMY N/A 01/28/2015   Procedure: LAPAROSCOPIC CHOLECYSTECTOMY;  Surgeon: Oneil Budge Md, MD;  Location: [facility];  Service: General;  Laterality: N/A;   excision of bone spurs Bilateral    Big toes   renal aneurysm Right    right coil-and only has function of left kidney   TOTAL ABDOMINAL HYSTERECTOMY     X-STOP IMPLANTATION      Family History  Problem Relation Age of Onset   Hypertension Mother    Heart disease Mother        Cardiac Arrest    Hypertension Father    Cancer Father    Hypertension Sister    Cancer Brother        Bladder Cancer    Hypertension Brother    Hypertension Sister     Social History   Socioeconomic History   Marital status: Unknown    Spouse name: Not on file   Number of children: 1   Years of education: 12   Highest  education level: Some college, no degree  Occupational History   Occupation: Retired  Tobacco Use   Smoking status: Former    Current packs/day: 0.00    Average packs/day: 0.3 packs/day for 20.0 years (5.0 ttl pk-yrs)    Types: Cigarettes    Start date: 1999    Quit date: 2019    Years since quitting: 6.7   Smokeless tobacco: Never  Vaping Use   Vaping status: Never Used  Substance and Sexual Activity   Alcohol use: Not Currently    Comment: occasionally   Drug use: No   Sexual activity: Yes    Birth control/protection: Surgical  Other Topics Concern   Not on file  Social History Narrative   Not on file   Social Drivers of Health   Financial Resource Strain: Low Risk  (02/15/2024)   Overall Financial Resource Strain (CARDIA)    Difficulty of  Paying Living Expenses: Not hard at all  Food Insecurity: No Food Insecurity (02/15/2024)   Hunger Vital Sign    Worried About Running Out of Food in the Last Year: Never true    Ran Out of Food in the Last Year: Never true  Transportation Needs: No Transportation Needs (02/15/2024)   PRAPARE - Administrator, Civil Service (Medical): No    Lack of Transportation (Non-Medical): No  Physical Activity: Inactive (02/15/2024)   Exercise Vital Sign    Days of Exercise per Week: 0 days    Minutes of Exercise per Session: 0 min  Stress: No Stress Concern Present (02/15/2024)   [employer] of Occupational Health - Occupational Stress Questionnaire    Feeling of Stress : Not at all  Social Connections: Socially Isolated (02/15/2024)   Social Connection and Isolation Panel    Frequency of Communication with Friends and Family: More than three times a week    Frequency of Social Gatherings with Friends and Family: More than three times a week    Attends Religious Services: Never    Database administrator or Organizations: No    Attends Banker Meetings: Never    Marital Status: Never married  Intimate Partner Violence: Not At Risk (02/15/2024)   Humiliation, Afraid, Rape, and Kick questionnaire    Fear of Current or Ex-Partner: No    Emotionally Abused: No    Physically Abused: No    Sexually Abused: No    Outpatient Medications Prior to Visit  Medication Sig Dispense Refill   allopurinol  (ZYLOPRIM ) 100 MG tablet Take 2 tablets by mouth once daily 60 tablet 11   benzonatate  (TESSALON ) 100 MG capsule Take 1 capsule (100 mg total) by mouth 2 (two) times daily as needed for cough. 20 capsule 0   butalbital -acetaminophen -caffeine  (FIORICET) 50-325-40 MG tablet Take 1 tablet by mouth every 12 (twelve) hours as needed for headache or migraine. 20 tablet 0   cetirizine  (ZYRTEC ) 10 MG tablet Take 1 tablet (10 mg total) by mouth daily. 30 tablet 0   clopidogrel  (PLAVIX ) 75 MG  tablet Take 1 tablet (75 mg total) by mouth every evening. 90 tablet 3   dexlansoprazole  (DEXILANT ) 60 MG capsule Take 1 capsule (60 mg total) by mouth daily. 30 capsule 0   diazepam  (VALIUM ) 5 MG tablet Take 1 by mouth 1 hour  pre-procedure with very light food. Do not drive motor vehicle. 1 tablet 0   dicyclomine  (BENTYL ) 10 MG capsule Take 1 capsule (10 mg total) by mouth 3 (three) times daily as needed for  spasms. 30 capsule 1   fluticasone -salmeterol (ADVAIR) 100-50 MCG/ACT AEPB Inhale 1 puff into the lungs 2 (two) times daily. 60 each 2   hydrocortisone  (ANUSOL -HC) 25 MG suppository Place 1 suppository (25 mg total) rectally 2 (two) times daily. 12 suppository 0   hydrOXYzine  (ATARAX ) 10 MG tablet Take 1 tablet (10 mg total) by mouth every 8 (eight) hours as needed for anxiety. 20 tablet 0   montelukast  (SINGULAIR ) 10 MG tablet Take 1 tablet (10 mg total) by mouth at bedtime. 30 tablet 3   ondansetron  (ZOFRAN -ODT) 4 MG disintegrating tablet Take 1 tablet (4 mg total) by mouth every 8 (eight) hours as needed. 20 tablet 0   sucralfate  (CARAFATE ) 1 g tablet Take 1 tablet (1 g total) by mouth 3 (three) times daily as needed. May dissolve 1 tablet into a glass of water and drink up to 3 times daily as needed 30 tablet 0   albuterol  (VENTOLIN  HFA) 108 (90 Base) MCG/ACT inhaler Inhale 1-2 puffs into the lungs every 6 (six) hours as needed for wheezing or shortness of breath. 1 each 2   atenolol  (TENORMIN ) 50 MG tablet Take 1 tablet by mouth once daily 90 tablet 0   CARTIA  XT 240 MG 24 hr capsule Take 1 capsule (240 mg total) by mouth daily. 90 capsule 3   doxycycline  (VIBRAMYCIN ) 100 MG capsule Take 1 capsule (100 mg total) by mouth 2 (two) times daily. 14 capsule 0   guaiFENesin  (MUCINEX ) 600 MG 12 hr tablet Take 1 tablet (600 mg total) by mouth 2 (two) times daily. 30 tablet 0   hydrochlorothiazide  (HYDRODIURIL ) 25 MG tablet Take 1 tablet by mouth once daily 90 tablet 0   methocarbamol  (ROBAXIN ) 500  MG tablet Take 1 tablet (500 mg total) by mouth every 8 (eight) hours as needed for muscle spasms. 30 tablet 1   traMADol  (ULTRAM ) 50 MG tablet Take 1 tablet (50 mg total) by mouth every 6 (six) hours as needed. 15 tablet 0   No facility-administered medications prior to visit.    Allergies  Allergen Reactions   Amoxil  [Amoxicillin ] Hives   Bee Venom Hives   Nsaids     Kidney disease    Shellfish Allergy Itching   Latex Rash    Pt states she gets blisters     ROS Review of Systems  Constitutional:  Negative for chills and fever.  HENT:  Positive for congestion, postnasal drip and sinus pressure. Negative for sinus pain.   Eyes:  Negative for pain and discharge.  Respiratory:  Negative for cough and shortness of breath.   Cardiovascular:  Negative for chest pain and palpitations.  Gastrointestinal:  Negative for abdominal pain, diarrhea and vomiting.  Endocrine: Negative for polydipsia and polyuria.  Genitourinary:  Negative for dysuria and hematuria.  Musculoskeletal:  Positive for arthralgias, back pain and neck pain. Negative for neck stiffness.  Skin:  Negative for rash.  Neurological:  Negative for dizziness and weakness.  Psychiatric/Behavioral:  Positive for agitation. Negative for behavioral problems. The patient is nervous/anxious.       Objective:    Physical Exam Vitals reviewed.  Constitutional:      General: She is not in acute distress.    Appearance: She is not diaphoretic.  HENT:     Head: Normocephalic and atraumatic.     Mouth/Throat:     Mouth: Mucous membranes are moist.     Comments: Whitish patch on the tongue Eyes:     General: No scleral  icterus.    Extraocular Movements: Extraocular movements intact.  Cardiovascular:     Rate and Rhythm: Normal rate and regular rhythm.     Heart sounds: Normal heart sounds. No murmur heard. Pulmonary:     Breath sounds: Normal breath sounds. No wheezing or rales.  Musculoskeletal:     Cervical back: Neck  supple. No tenderness.     Lumbar back: Tenderness present. Decreased range of motion.     Right lower leg: No edema.     Left lower leg: No edema.  Skin:    General: Skin is warm.     Findings: No rash.  Neurological:     General: No focal deficit present.     Mental Status: She is alert and oriented to person, place, and time.  Psychiatric:        Mood and Affect: Mood normal.        Behavior: Behavior normal.     BP 138/80 (BP Location: Left Arm)   Pulse (!) 51   Ht 5' 6 (1.676 m)   Wt 181 lb 6.4 oz (82.3 kg)   SpO2 98%   BMI 29.28 kg/m  Wt Readings from Last 3 Encounters:  07/08/24 181 lb 6.4 oz (82.3 kg)  03/19/24 180 lb (81.6 kg)  03/06/24 180 lb 12.8 oz (82 kg)    Lab Results  Component Value Date   TSH 0.948 01/04/2024   Lab Results  Component Value Date   WBC 6.9 01/04/2024   HGB 14.0 01/04/2024   HCT 41.6 01/04/2024   MCV 98 (H) 01/04/2024   PLT 322 01/04/2024   Lab Results  Component Value Date   NA 138 02/07/2024   K 3.9 02/07/2024   CO2 24 02/07/2024   GLUCOSE 98 02/07/2024   BUN 25 (H) 02/07/2024   CREATININE 1.49 (H) 02/07/2024   BILITOT <0.2 01/04/2024   ALKPHOS 68 01/04/2024   AST 18 01/04/2024   ALT 20 01/04/2024   PROT 7.0 01/04/2024   ALBUMIN 4.1 01/04/2024   CALCIUM  10.2 02/07/2024   ANIONGAP 10 02/07/2024   EGFR 39 (L) 01/04/2024   Lab Results  Component Value Date   CHOL 216 (H) 01/04/2024   Lab Results  Component Value Date   HDL 33 (L) 01/04/2024   Lab Results  Component Value Date   LDLCALC 145 (H) 01/04/2024   Lab Results  Component Value Date   TRIG 206 (H) 01/04/2024   Lab Results  Component Value Date   CHOLHDL 6.5 (H) 01/04/2024   Lab Results  Component Value Date   HGBA1C 6.4 (H) 01/04/2024      Assessment & Plan:   Problem List Items Addressed This Visit       Cardiovascular and Mediastinum   Hypertension - Primary   BP Readings from Last 1 Encounters:  07/08/24 138/80   Well-controlled  with Atenolol , Cartia  and HCTZ Recent stress has caused fluctuation in her blood pressure Did not tolerate Hydralazine in the past Needs to cut down soft drink intake Counseled for compliance with the medications Advised DASH diet and moderate exercise/walking, at least 150 mins/week      Relevant Medications   hydrochlorothiazide  (HYDRODIURIL ) 25 MG tablet   CARTIA  XT 240 MG 24 hr capsule   atenolol  (TENORMIN ) 50 MG tablet     Respiratory   COPD, mild (HCC)   Well controlled with Symbicort , but was expensive - had to switch to Advair, needs to remain compliant Albuterol  as needed  Relevant Medications   albuterol  (VENTOLIN  HFA) 108 (90 Base) MCG/ACT inhaler     Digestive   GERD (gastroesophageal reflux disease)   Refilled Dexilant  - has been very expensive, but she does not have optimum effect with other PPI Has tried Omeprazole, Pantoprazole  without relief Her epigastric pain can also likely be due to GERD/gastritis Referred to [facility] pharmacist for assistance with Dexilant       Relevant Orders   AMB Referral [facility] Care Management   Oral candidiasis   Whitish patch on tongue likely due to oral candidiasis Nystatin swish and swallow prescribed Advised to rinse mouth after each use of inhaled corticosteroid      Relevant Medications   nystatin (MYCOSTATIN) 100000 UNIT/ML suspension     Endocrine   Type 2 diabetes mellitus with other specified complication (HCC)   Lab Results  Component Value Date   HGBA1C 6.4 (H) 01/04/2024   Diet controlled Associated with HTN, HLD and CKD Not on statin, does not prefer to take any new medicine        Nervous and Auditory   Lumbar radiculopathy   Has been evaluated by Orthopedic surgery MRI of lumbar spine reviewed Was seen by PM&R for epidural injection Avoid heavy lifting and frequent bending Tylenol  as needed for pain Unable to take oral NSAIDs, prescribed tramadol  as needed for severe pain      Relevant Medications    traMADol  (ULTRAM ) 50 MG tablet     Genitourinary   CKD (chronic kidney disease) stage 3, GFR 30-59 ml/min (HCC)   Last BMP showed GFR 39 in 03/25, usually stays around 35 Follows up with Nephrologist - Dr. Jerrye, last visit note reviewed Avoid nephrotoxic agents Maintain adequate hydration      Relevant Orders   AMB Referral [facility] Care Management     Other   Solitary kidney, acquired   Has only 1 for functioning kidney, has  atrophy of right kidney Followed by nephrology         Meds ordered this encounter  Medications   traMADol  (ULTRAM ) 50 MG tablet    Sig: Take 1 tablet (50 mg total) by mouth every 12 (twelve) hours as needed.    Dispense:  30 tablet    Refill:  0   albuterol  (VENTOLIN  HFA) 108 (90 Base) MCG/ACT inhaler    Sig: Inhale 1-2 puffs into the lungs every 6 (six) hours as needed for wheezing or shortness of breath.    Dispense:  18 each    Refill:  2   nystatin (MYCOSTATIN) 100000 UNIT/ML suspension    Sig: Use as directed 5 mLs (500,000 Units total) in the mouth or throat 4 (four) times daily. Swish and swallow.    Dispense:  120 mL    Refill:  0   hydrochlorothiazide  (HYDRODIURIL ) 25 MG tablet    Sig: Take 1 tablet (25 mg total) by mouth daily.    Dispense:  90 tablet    Refill:  3   CARTIA  XT 240 MG 24 hr capsule    Sig: Take 1 capsule (240 mg total) by mouth daily.    Dispense:  90 capsule    Refill:  3   atenolol  (TENORMIN ) 50 MG tablet    Sig: Take 1 tablet (50 mg total) by mouth daily.    Dispense:  90 tablet    Refill:  3    Follow-up: Return in about 6 months (around 01/05/2025).    Suzzane MARLA Blanch, MD

## 2024-07-08 NOTE — Assessment & Plan Note (Addendum)
 Has been evaluated by Orthopedic surgery MRI of lumbar spine reviewed Was seen by PM&R for epidural injection Avoid heavy lifting and frequent bending Tylenol  as needed for pain Unable to take oral NSAIDs, prescribed tramadol  as needed for severe pain

## 2024-07-08 NOTE — Patient Instructions (Signed)
Please continue to take medications as prescribed. ? ?Please continue to follow low salt diet and ambulate as tolerated. ?

## 2024-07-08 NOTE — Assessment & Plan Note (Signed)
 Lab Results  Component Value Date   HGBA1C 6.4 (H) 01/04/2024   Diet controlled Associated with HTN, HLD and CKD Not on statin, does not prefer to take any new medicine

## 2024-07-09 ENCOUNTER — Encounter (INDEPENDENT_AMBULATORY_CARE_PROVIDER_SITE_OTHER): Payer: Self-pay | Admitting: *Deleted

## 2024-07-09 ENCOUNTER — Telehealth: Payer: Self-pay

## 2024-07-09 NOTE — Progress Notes (Signed)
 Care Guide Pharmacy Note  07/09/2024 Name: Jaclyn Brooks MRN: [id number] DOB: 03/03/1961  Referred By: Tobie Suzzane POUR, MD Reason for referral: Complex Care Management (Outreach to schedule with Pharm d )   Jaclyn Brooks is a 63 y.o. year old female who is a primary care patient of Tobie Suzzane POUR, MD.  Jaclyn Brooks was referred to the pharmacist for assistance related to: CKD Stage 3  Successful contact was made with the patient to discuss pharmacy services including being ready for the pharmacist to call at least 5 minutes before the scheduled appointment time and to have medication bottles and any blood pressure readings ready for review. The patient agreed to meet with the pharmacist via telephone visit on (date/time).07/11/2024  Jeoffrey Buffalo , RMA     [place], [facility] Guide  Direct Dial: [phone]  Website: [email or web address]

## 2024-07-10 ENCOUNTER — Other Ambulatory Visit: Payer: Self-pay

## 2024-07-10 ENCOUNTER — Emergency Department (HOSPITAL_COMMUNITY)
Admission: EM | Admit: 2024-07-10 | Discharge: 2024-07-10 | Disposition: A | Discharge status: Home / Self Care | Attending: Emergency Medicine | Admitting: Emergency Medicine

## 2024-07-10 ENCOUNTER — Encounter (HOSPITAL_COMMUNITY): Payer: Self-pay

## 2024-07-10 ENCOUNTER — Ambulatory Visit: Payer: Self-pay

## 2024-07-10 ENCOUNTER — Ambulatory Visit: Attending: Surgical | Admitting: Physical Therapy

## 2024-07-10 DIAGNOSIS — Z7901 Long term (current) use of anticoagulants: Secondary | ICD-10-CM | POA: Insufficient documentation

## 2024-07-10 DIAGNOSIS — R110 Nausea: Secondary | ICD-10-CM | POA: Diagnosis present

## 2024-07-10 DIAGNOSIS — K219 Gastro-esophageal reflux disease without esophagitis: Secondary | ICD-10-CM | POA: Insufficient documentation

## 2024-07-10 DIAGNOSIS — Z91040 Latex allergy status: Secondary | ICD-10-CM | POA: Diagnosis not present

## 2024-07-10 DIAGNOSIS — M109 Gout, unspecified: Secondary | ICD-10-CM | POA: Diagnosis not present

## 2024-07-10 DIAGNOSIS — M10371 Gout due to renal impairment, right ankle and foot: Secondary | ICD-10-CM | POA: Insufficient documentation

## 2024-07-10 DIAGNOSIS — N189 Chronic kidney disease, unspecified: Secondary | ICD-10-CM | POA: Diagnosis not present

## 2024-07-10 DIAGNOSIS — M25552 Pain in left hip: Secondary | ICD-10-CM | POA: Insufficient documentation

## 2024-07-10 DIAGNOSIS — I129 Hypertensive chronic kidney disease with stage 1 through stage 4 chronic kidney disease, or unspecified chronic kidney disease: Secondary | ICD-10-CM | POA: Insufficient documentation

## 2024-07-10 DIAGNOSIS — M6281 Muscle weakness (generalized): Secondary | ICD-10-CM | POA: Insufficient documentation

## 2024-07-10 DIAGNOSIS — M5459 Other low back pain: Secondary | ICD-10-CM | POA: Insufficient documentation

## 2024-07-10 DIAGNOSIS — R262 Difficulty in walking, not elsewhere classified: Secondary | ICD-10-CM | POA: Insufficient documentation

## 2024-07-10 MED ORDER — ALUM & MAG HYDROXIDE-SIMETH 200-200-20 MG/5ML PO SUSP
30.0000 mL | Freq: Once | ORAL | Status: AC
  Administered 2024-07-10: 30 mL via ORAL
  Filled 2024-07-10: qty 30

## 2024-07-10 MED ORDER — PREDNISONE 50 MG PO TABS
60.0000 mg | ORAL_TABLET | Freq: Once | ORAL | Status: AC
  Administered 2024-07-10: 60 mg via ORAL
  Filled 2024-07-10: qty 1

## 2024-07-10 MED ORDER — PREDNISONE 10 MG (21) PO TBPK: ORAL_TABLET | ORAL | 0 refills | Status: DC

## 2024-07-10 MED ORDER — HYDROCODONE-ACETAMINOPHEN 5-325 MG PO TABS: 1.0000 | ORAL_TABLET | Freq: Four times a day (QID) | ORAL | 0 refills | Status: DC | PRN

## 2024-07-10 MED ORDER — HYDROCODONE-ACETAMINOPHEN 5-325MG PREPACK (~~LOC~~: ORAL_TABLET | ORAL | 0 refills | Status: DC

## 2024-07-10 NOTE — ED Provider Notes (Signed)
 [facility] AT [facility]  Provider Note  CSN: [id number] Arrival date & time: 07/10/24 0303  History Chief Complaint  Patient presents with   multiple complaints    Jaclyn Brooks is a 63 y.o. female with history of gout, GERD, CKD, HTN here primarily for pain in R foot/ankle consistent with prior gout. No fall or injuries. Started yesterday after a regularly scheduled PCP visit.   She also reported heart burn/nausea and some mild SOB since weather cooled over the last day or so, but declines ED workup for those symptoms other than a GI cocktail. She used an inhaler and took some TUMS at home earlier.    Home Medications Prior to Admission medications   Medication Sig Start Date End Date Taking? Authorizing Provider  HYDROcodone -acetaminophen  (NORCO/VICODIN) 5-325 MG tablet Take 1 tablet by mouth every 6 (six) hours as needed for severe pain (pain score 7-10). 07/10/24  Yes Roselyn Carlin NOVAK, MD  HYDROcodone -acetaminophen  (VICODIN) 5-325 mg TABS tablet 1 tablet every 4-6 hours as needed for pain 07/10/24  Yes Roselyn Carlin NOVAK, MD  predniSONE  (STERAPRED UNI-PAK 21 TAB) 10 MG (21) TBPK tablet 10mg  Tabs, 6 day taper. Use as directed 07/10/24  Yes Roselyn Carlin NOVAK, MD  albuterol  (VENTOLIN  HFA) 108 [phone] Base) MCG/ACT inhaler Inhale 1-2 puffs into the lungs every 6 (six) hours as needed for wheezing or shortness of breath. 07/08/24   Tobie Suzzane POUR, MD  allopurinol  (ZYLOPRIM ) 100 MG tablet Take 2 tablets by mouth once daily 12/19/23   Patel, Rutwik K, MD  atenolol  (TENORMIN ) 50 MG tablet Take 1 tablet (50 mg total) by mouth daily. 07/08/24   Tobie Suzzane POUR, MD  benzonatate  (TESSALON ) 100 MG capsule Take 1 capsule (100 mg total) by mouth 2 (two) times daily as needed for cough. 01/19/24   Enedelia Dorna HERO, FNP  butalbital -acetaminophen -caffeine  (FIORICET) 50-325-40 MG tablet Take 1 tablet by mouth every 12 (twelve) hours as needed for headache or migraine.  08/15/22   Tobie Suzzane POUR, MD  CARTIA  XT 240 MG 24 hr capsule Take 1 capsule (240 mg total) by mouth daily. 07/08/24   Tobie Suzzane POUR, MD  cetirizine  (ZYRTEC ) 10 MG tablet Take 1 tablet (10 mg total) by mouth daily. 11/24/23   Leath-Warren, Etta PARAS, NP  clopidogrel  (PLAVIX ) 75 MG tablet Take 1 tablet (75 mg total) by mouth every evening. 08/29/23   Tobie Suzzane POUR, MD  dexlansoprazole  (DEXILANT ) 60 MG capsule Take 1 capsule (60 mg total) by mouth daily. 04/29/24   Stuart Vernell Norris, PA-C  diazepam  (VALIUM ) 5 MG tablet Take 1 by mouth 1 hour  pre-procedure with very light food. Do not drive motor vehicle. 03/11/24   Eldonna Novel, MD  dicyclomine  (BENTYL ) 10 MG capsule Take 1 capsule (10 mg total) by mouth 3 (three) times daily as needed for spasms. 07/06/23   Tobie Suzzane POUR, MD  fluticasone -salmeterol (ADVAIR) 100-50 MCG/ACT AEPB Inhale 1 puff into the lungs 2 (two) times daily. 01/04/24   Tobie Suzzane POUR, MD  hydrochlorothiazide  (HYDRODIURIL ) 25 MG tablet Take 1 tablet (25 mg total) by mouth daily. 07/08/24   Tobie Suzzane POUR, MD  hydrocortisone  (ANUSOL -HC) 25 MG suppository Place 1 suppository (25 mg total) rectally 2 (two) times daily. 04/29/24   Stuart Vernell Norris, PA-C  hydrOXYzine  (ATARAX ) 10 MG tablet Take 1 tablet (10 mg total) by mouth every 8 (eight) hours as needed for anxiety. 12/18/23   Chandra Harlene LABOR, NP  montelukast  (SINGULAIR )  10 MG tablet Take 1 tablet (10 mg total) by mouth at bedtime. 07/06/23   Tobie Suzzane POUR, MD  nystatin (MYCOSTATIN) 100000 UNIT/ML suspension Use as directed 5 mLs (500,000 Units total) in the mouth or throat 4 (four) times daily. Swish and swallow. 07/08/24   Tobie Suzzane POUR, MD  ondansetron  (ZOFRAN -ODT) 4 MG disintegrating tablet Take 1 tablet (4 mg total) by mouth every 8 (eight) hours as needed. 06/05/23   Leath-Warren, Etta PARAS, NP  sucralfate  (CARAFATE ) 1 g tablet Take 1 tablet (1 g total) by mouth 3 (three) times daily as needed. May dissolve 1  tablet into a glass of water and drink up to 3 times daily as needed 04/29/24   Stuart Vernell Norris, PA-C  traMADol  (ULTRAM ) 50 MG tablet Take 1 tablet (50 mg total) by mouth every 12 (twelve) hours as needed. 07/08/24   Tobie Suzzane POUR, MD  atorvastatin  (LIPITOR) 20 MG tablet Take 1 tablet (20 mg total) by mouth at bedtime. 11/25/19 02/18/20  Bari Theodoro FALCON, MD     Allergies    Amoxil  [amoxicillin ], Bee venom, Nsaids, Shellfish allergy, and Latex   Review of Systems   Review of Systems Please see HPI for pertinent positives and negatives  Physical Exam BP (!) 157/87 (BP Location: Left Arm)   Pulse (!) 45   Temp 97.8 F (36.6 C) (Oral)   Resp 18   Ht 5' 6 (1.676 m)   Wt 82.1 kg   SpO2 100%   BMI 29.21 kg/m   Physical Exam Vitals and nursing note reviewed.  Constitutional:      Appearance: Normal appearance.  HENT:     Head: Normocephalic and atraumatic.     Nose: Nose normal.     Mouth/Throat:     Mouth: Mucous membranes are moist.  Eyes:     Extraocular Movements: Extraocular movements intact.     Conjunctiva/sclera: Conjunctivae normal.  Cardiovascular:     Rate and Rhythm: Normal rate.  Pulmonary:     Effort: Pulmonary effort is normal.     Breath sounds: Normal breath sounds. No wheezing or rales.  Abdominal:     General: Abdomen is flat.     Palpations: Abdomen is soft.     Tenderness: There is no abdominal tenderness. There is no guarding.  Musculoskeletal:        General: Swelling and tenderness (R lateral foot and ankle, mild swelling and warmth) present. Normal range of motion.     Cervical back: Neck supple.  Skin:    General: Skin is warm and dry.  Neurological:     General: No focal deficit present.     Mental Status: She is alert.  Psychiatric:        Mood and Affect: Mood normal.     ED Results / Procedures / Treatments   EKG None  Procedures Procedures  Medications Ordered in the ED Medications  predniSONE  (DELTASONE ) tablet 60 mg  (60 mg Oral Given 07/10/24 0340)  alum & mag hydroxide-simeth (MAALOX/MYLANTA) 200-200-20 MG/5ML suspension 30 mL (30 mLs Oral Given 07/10/24 0339)    Initial Impression and Plan  Patient here for evaluation of suspected gout in R foot/ankle. Will treat with prednisone , norco; her other complaints are not in need of emergent workup and she declines any further labs/imaging. Plan PCP follow up, RTED for any other concerns.   ED Course       MDM Rules/Calculators/A&P Medical Decision Making Problems Addressed: Acute gout due to renal impairment  involving right foot: acute illness or injury Gastroesophageal reflux disease, unspecified whether esophagitis present: chronic illness or injury  Risk OTC drugs. Prescription drug management.     Final Clinical Impression(s) / ED Diagnoses Final diagnoses:  Acute gout due to renal impairment involving right foot  Gastroesophageal reflux disease, unspecified whether esophagitis present    Rx / DC Orders ED Discharge Orders          Ordered    predniSONE  (STERAPRED UNI-PAK 21 TAB) 10 MG (21) TBPK tablet        07/10/24 0341    HYDROcodone -acetaminophen  (VICODIN) 5-325 mg TABS tablet        07/10/24 0341    HYDROcodone -acetaminophen  (NORCO/VICODIN) 5-325 MG tablet  Every 6 hours PRN        07/10/24 0341             Roselyn Carlin NOVAK, MD 07/10/24 [phone]

## 2024-07-10 NOTE — ED Triage Notes (Signed)
 Pt said she thinks she is having a gout flare up, as well as heartburn/indigestion, and shortness of breath. Pt said she has not taken anything for the flare up since it has happened in so long. She took tums about an hour ago. Pt took her rescue inhaler 'early morning'.

## 2024-07-11 ENCOUNTER — Other Ambulatory Visit (INDEPENDENT_AMBULATORY_CARE_PROVIDER_SITE_OTHER)

## 2024-07-11 DIAGNOSIS — K219 Gastro-esophageal reflux disease without esophagitis: Secondary | ICD-10-CM

## 2024-07-11 DIAGNOSIS — N1832 Chronic kidney disease, stage 3b: Secondary | ICD-10-CM

## 2024-07-11 MED FILL — Hydrocodone-Acetaminophen Tab 5-325 MG: ORAL | Qty: 6 | Status: AC

## 2024-07-11 NOTE — Progress Notes (Signed)
   07/11/2024 Name: Jaclyn Brooks MRN: [id number] DOB: [birth year]  Chief Complaint  Patient presents with   Medication Assistance    Jaclyn Brooks is a 63 y.o. year old female who presented for a telephone visit.   They were referred to the pharmacist by their PCP for assistance in managing medication access.    Subjective:  Care Team: Primary Care Provider: Tobie Suzzane POUR, MD ; Next Scheduled Visit:  Future Appointments  Date Time Provider Department Center  07/15/2024  2:30 PM Maranda Redell SAUNDERS, PT [facility] None  07/16/2024  9:30 AM Pandora Cadet, [facility] None  08/16/2024  3:15 PM Magnant, Carlin CROME, PA-C [facility] None  01/06/2025 10:20 AM Tobie Suzzane POUR, MD [facility] [address]  02/18/2025 10:00 AM [facility]-ANNUAL WELLNESS VISIT [facility] [address]    Medication Access/Adherence  Current Pharmacy:  [facility] - [place], [place] - [address] [place] [address] Phone: [phone] Fax: [phone]   Patient reports affordability concerns with their medications: Yes  Patient reports access/transportation concerns to their pharmacy: No  Patient reports adherence concerns with their medications:  Yes  due to financial constraints   Medication Assistance: Copay is $100 odexilant , has been having to get 15 pills at a time. Reports current income now $975 a month, let go from job d/t back problems and not being able to lift >100 lb.  Denies having applied to any state programs for financial assistance.   Objective:  Lab Results  Component Value Date   HGBA1C 6.4 (H) 01/04/2024    Lab Results  Component Value Date   CREATININE 1.49 (H) 02/07/2024   BUN 25 (H) 02/07/2024   NA 138 02/07/2024   K 3.9 02/07/2024   CL 104 02/07/2024   CO2 24 02/07/2024    Lab Results  Component Value Date   CHOL 216 (H) 01/04/2024   HDL 33 (L) 01/04/2024   LDLCALC 145 (H) 01/04/2024   TRIG 206 (H) 01/04/2024   CHOLHDL 6.5 (H) 01/04/2024    Medication Assistance:  Assessment/Plan:   -reviewed current income, based on information provided could qualify for extra help or medicaid.  For LIS/Medicare Extra Help apply here: [email or web address]  -reviewed state resources including Sen[employer] Information Program [place]).  Contact Medicare Counselor / set up an appointment by calling [phone]. Hours are Monday through Friday from 8am to 5pm.  - Medicaid resources [facility] Contact Center at [phone]): [facility] DSS Ms. Sudie Brunswick, Director [address] [place], [place] [address] [address] [place], [place] [address] Tel. [phone] Fax #[phone]  - Recommend applying for financial assistance through state programs. If denied from LIS/Extra help or medicaid, denial letter would be required for [employer] patient assistance as next step.   Follow Up Plan: Patient to attempt to obtain/complete applications for state led financial programs, pharmacist to check in with patient on Tuesday to see if she may need additional assistance/possible in person help.   Cadet Pandora, PharmD, BCGP Clinical Pharmacist  [phone]

## 2024-07-15 ENCOUNTER — Ambulatory Visit: Admitting: Physical Therapy

## 2024-07-16 ENCOUNTER — Telehealth: Payer: Self-pay

## 2024-07-16 ENCOUNTER — Other Ambulatory Visit: Payer: Self-pay

## 2024-07-16 NOTE — Progress Notes (Unsigned)
   07/16/2024 Name: IARA MONDS MRN: [id number] DOB: Apr 09, 1961  Chief Complaint  Patient presents with   Medication Assistance    07/16/2024: -hoping to follow-up on LIS application. At this time has not been completed. In a great deal of pain at time of today's call so opted to be called at a later date. Will likely need to set up a visit in person to closely work through financial options.   Lang Sieve, PharmD, BCGP Clinical Pharmacist  [phone]

## 2024-07-18 DIAGNOSIS — N2581 Secondary hyperparathyroidism of renal origin: Secondary | ICD-10-CM | POA: Diagnosis not present

## 2024-07-18 DIAGNOSIS — I701 Atherosclerosis of renal artery: Secondary | ICD-10-CM | POA: Diagnosis not present

## 2024-07-18 DIAGNOSIS — N1832 Chronic kidney disease, stage 3b: Secondary | ICD-10-CM | POA: Diagnosis not present

## 2024-07-18 DIAGNOSIS — R809 Proteinuria, unspecified: Secondary | ICD-10-CM | POA: Diagnosis not present

## 2024-07-18 DIAGNOSIS — I129 Hypertensive chronic kidney disease with stage 1 through stage 4 chronic kidney disease, or unspecified chronic kidney disease: Secondary | ICD-10-CM | POA: Diagnosis not present

## 2024-07-18 DIAGNOSIS — Z905 Acquired absence of kidney: Secondary | ICD-10-CM | POA: Diagnosis not present

## 2024-07-19 LAB — PROTEIN / CREATININE RATIO, URINE: Creatinine, Urine: 106.2

## 2024-07-19 LAB — MICROALBUMIN / CREATININE URINE RATIO: Microalb Creat Ratio: 2901

## 2024-07-23 ENCOUNTER — Encounter: Payer: Self-pay | Admitting: Physical Therapy

## 2024-07-23 ENCOUNTER — Ambulatory Visit: Admitting: Physical Therapy

## 2024-07-23 DIAGNOSIS — R262 Difficulty in walking, not elsewhere classified: Secondary | ICD-10-CM

## 2024-07-23 DIAGNOSIS — M5459 Other low back pain: Secondary | ICD-10-CM | POA: Insufficient documentation

## 2024-07-23 DIAGNOSIS — M25552 Pain in left hip: Secondary | ICD-10-CM | POA: Diagnosis present

## 2024-07-23 DIAGNOSIS — M6281 Muscle weakness (generalized): Secondary | ICD-10-CM | POA: Insufficient documentation

## 2024-07-23 NOTE — Therapy (Signed)
 OUTPATIENT PHYSICAL THERAPY TREATMENT   Patient Name: Jaclyn Brooks MRN: [id number] DOB:[birth year], 63 y.o., female Today's Date: 07/23/2024  END OF SESSION:  PT End of Session - 07/23/24 1507     Visit Number 5    Number of Visits 10    Date for Recertification  07/04/24    Authorization Type [employer] Medicare    Authorization Time Period 6 visits covered (05/13/2024-06/20/2024)    Authorization - Visit Number 4    Authorization - Number of Visits 6    Progress Note Due on Visit 10    PT Start Time 1505    PT Stop Time 1545    PT Time Calculation (min) 40 min    Activity Tolerance Patient tolerated treatment well    Behavior During Therapy [facility] for tasks assessed/performed            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Past Surgical History:  Procedure Laterality Date   CHOLECYSTECTOMY N/A 01/28/2015   Procedure: LAPAROSCOPIC CHOLECYSTECTOMY;  Surgeon: Oneil Budge Md, MD;  Location: [facility];  Service: General;  Laterality: N/A;   excision of bone spurs Bilateral    Big toes   renal aneurysm Right    right coil-and only has function of left kidney   TOTAL ABDOMINAL HYSTERECTOMY     X-STOP IMPLANTATION     Patient Active Problem List   Diagnosis Date Noted   Oral candidiasis 07/08/2024   Lumbar radiculopathy 03/19/2024   Anxiety 12/21/2023   Flank pain 09/19/2023   RUQ pain 09/19/2023   Chronic sinusitis 08/29/2023   Enlarged skin mole 08/29/2023   Dysphagia 07/23/2023   Reactive airway disease 07/06/2023   Loose bowel movements 07/06/2023   Primary osteoarthritis of left hip 05/05/2023   Dysuria 04/16/2023   Diarrhea of presumed infectious origin 02/01/2023   Intractable migraine with aura without status migrainosus 08/15/2022   Traumatic hematoma of right lower leg 07/13/2022    History of total abdominal hysterectomy 07/04/2022   GAD (generalized anxiety disorder) 04/28/2022   Caregiver stress 04/28/2022   Chronic left-sided low back pain without sciatica 04/13/2022   Contact dermatitis 04/13/2022   Primary osteoarthritis of right knee 03/09/2022   Refused influenza vaccine 12/02/2021   Encounter for general adult medical examination with abnormal findings 11/30/2021   Epidermal cyst 11/30/2021   Acute recurrent frontal sinusitis 09/21/2021   Allergic rhinitis 08/29/2021   Nausea 08/27/2021   Hemorrhoids 07/07/2021   Gout 03/25/2021   Type 2 diabetes mellitus with other specified complication (HCC) 07/14/2020   Primary osteoarthritis of right foot 05/12/2020   GERD (gastroesophageal reflux disease) 05/31/2019   Solitary kidney, acquired 05/10/2019   Carotid arterial disease 12/11/2018   COPD, 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/01/2018   CKD (chronic kidney disease) stage 3, GFR 30-59 ml/min (HCC) 10/01/2018   Aortic valve sclerosis 10/01/2018   Hypertension 09/11/2018    PCP: Tobie Suzzane POUR, MD   REFERRING PROVIDER: Shirly Carlin CROME, PA-C   REFERRING DIAG: M54.16 (ICD-10-CM) - Lumbar radiculopathy   Rationale for Evaluation and Treatment:  Rehabiliation  THERAPY DIAG:  Other low back pain  Pain in left hip  Muscle weakness (generalized)  Difficulty in walking, not elsewhere classified  ONSET DATE: End of April  2025   SUBJECTIVE:                                                                                                                                                                                           SUBJECTIVE STATEMENT: States the pain has not improved any, may be getting worse.    PERTINENT HISTORY:  See above PMH  PAIN:  NPRS scale: 9/10 upon arrival Pain location:left hip now, she had shot in her low back which helped the pain Pain description: comes and goes, tingling, dull Aggravating factors: walking,  Relieving factors: rest,  meds   PRECAUTIONS: ,  None  RED FLAGS: None   WEIGHT BEARING RESTRICTIONS:  No  FALLS:  Has patient fallen in last 6 months? No   OCCUPATION:  Works for [employer] department  PLOF:  Independent  PATIENT GOALS:  Reduce pain  OBJECTIVE:  Note: Objective measures were completed at Evaluation unless otherwise noted.  DIAGNOSTIC FINDINGS:  Lumbar CT IMPRESSION: 1. Grade 1 degenerative anterolisthesis at L4-5 is worse with standing and exacerbated by flexion. 2. Moderate right subarticular and severe right foraminal stenosis at L4-5. 3. Moderate left foraminal narrowing at L4-5. 4. Rightward disc protrusion at L5-S1 likely contacts the traversing right S1 nerve roots. 5. Moderate foraminal stenosis bilaterally at L5-S1 is worse left than right. 6. Mild facet hypertrophy at L1-2 and L2-3 without significant stenosis. 7. 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 8. Asymmetric right-sided renal atrophy.  PATIENT SURVEYS:  Patient-Specific Activity Scoring Scheme  0 represents "unable to perform." 10 represents "able to perform at prior level. 0 1 2 3 4 5 6 7 8 9  10 (Date and Score)   Activity Eval     1. Walking 20 minutes 2     2. Sitting for one hour  2    3.     4.    5.    Score 2/10    Total score = sum of the activity scores/number of activities Minimum detectable change (90%CI) for average score = 2 points Minimum detectable change (90%CI) for single activity score = 3 points   GAIT: Distance walked: Can walk up to 20 minutes max before too much pain Assistive device utilized: None Level of assistance: Complete Independence Comments: antalgic gait on left, lower gait speed   LUMBAR ROM:   Active  AROM  eval  Flexion 50*  Extension 10*  Right lateral flexion 10  Left lateral flexion 10*  Right rotation 50%  Left rotation 25%   (Blank rows = not tested) *notes pain   LOWER EXTREMITY MMT:    MMT Right eval  Left eval  Hip  flexion 5 4  Hip extension    Hip abduction 5 4  Hip adduction 5 4  Hip internal rotation    Hip external rotation    Knee flexion 5 5  Knee extension 5 5  Ankle dorsiflexion 5 5  Ankle plantarflexion    Ankle inversion    Ankle eversion     (Blank rows = not tested)    SPECIAL TESTS:  Lumbar Straight leg raise test: Positive and Slump test: Positive                                                                                                                             TREATMENT DATE:  07/23/24 Therex: Nustep L4 x 10 min UEs/LEs with moist heat to low back Supine hamstring stretch x30 2 reps each side Supine LTR 5 sec X 10 bilat  Nmed: Standing lumbar ext against wall 2x10 Supine hip adduciton isometric ball squeeze 5 sec X 10 Supine clamshell resisted iso red TB 10x5 Supine pelvic tilt x10 Supine PPT + marching x5  Manual therapy: STM with biofeeze to low back  06/24/24 Therex: Nustep L4 x 6 min UEs/LEs Standing lumbar ext against wall 2x10 Supine hamstring stretch x30 Supine ITB stretch with strap x30 Supine LTR 5x10  Nmed: Supine clamshell resisted iso green TB 10x5 Supine bridge green TB around knees 10x5 Supine pelvic tilt 2x10 Supine PPT + marching x5 Sidelying hip ext x10 to work on glute strengthening and TFL inhibition  Manual therapy: STM & TPR TFL and hip flexor   PATIENT EDUCATION: Education details: HEP, keeping weight shift equal if her leg isn't hurting too much Person educated: Patient Education method: Explanation, Demonstration, Verbal cues, and Handouts Education comprehension: verbalized understanding, further education recommended   HOME EXERCISE PROGRAM: Access Code: [email or web address] URL: [email or web address] Date: 06/03/2024 Prepared by: Gellen April Earnie Starring  Exercises - Standing Lumbar Extension at [employer] - Forearms  - 2 x daily - 6 x weekly - 1 sets - 10 reps - 3 sec hold - Slump Stretch  - 2 x daily - 6  x weekly - 1 sets - 10 reps - 3 sec hold - supine IT band and piriformis stretch  - 2 x daily - 6 x weekly - 1 sets - 3 reps - 30 hold - Supine Lower Trunk Rotation  - 2 x daily - 6 x weekly - 1 sets - 5 reps - 10 sec hold - Left Standing Lateral Shift Correction at Wall - Repetitions  - 1 x daily - 7 x weekly - 1 sets - 10 reps - Supine Posterior Pelvic Tilt  - 1 x daily - 7 x weekly - 2 sets - 10 reps - 3 sec hold - Supine Hip Abduction  - 1 x daily - 7 x weekly - 2 sets - 10 reps  ASSESSMENT:  CLINICAL IMPRESSION: She is still in a great deal of pain. We  modified exercises some for better tolerance. I did try using biofreeze to see if this will help any. She is scheduled for back injections next week.   OBJECTIVE IMPAIRMENTS: decreased activity tolerance for ADL's, difficulty walking,  decreased endurance, decreased mobility, decreased ROM, decreased strength, impaired flexibility, impaired LE use, and pain.  ACTIVITY LIMITATIONS: bending, lifting, carry, reaching, locomotion, cleaning, community activity, driving, and or occupation  PERSONAL FACTORS: see above PMH  also affecting patient's functional outcome.  REHAB POTENTIAL: Good  CLINICAL DECISION MAKING: Stable/uncomplicated  EVALUATION COMPLEXITY: Low    GOALS: Short term PT Goals Target date: 06/06/2024   Pt will be I and compliant with HEP. Baseline:  Goal status: PARTIALLY MET 06/24/24 Pt will decrease pain by 25% overall Baseline: Goal status: IN PROGRESS 06/24/24  Long term PT goals Target date:07/04/2024   Pt will improve lumbar ROM to [facility] to improve functional mobility Baseline: Goal status: New Pt will improve  left leg strength to at least 4+/5 MMT to improve functional strength Baseline: Goal status: New Pt will improve Patient specific functional scale (PSFS) to at least 7/10 to show improved function level Baseline: Goal status: New Pt will reduce pain to overall less than 3/10 with usual activity and  work activity. Baseline: Goal status: New Pt will be able to ambulate community distances at least 1000 ft WNL gait pattern without complaints Baseline: Goal status: New  PLAN: PT FREQUENCY: 1-2 times per week   PT DURATION: 6-8 weeks  PLANNED INTERVENTIONS (unless contraindicated): 97110-Therapeutic exercises, 97530- Therapeutic activity, [id number]- Neuromuscular re-education, 97535- Self Care, 02859- Manual therapy, [id number]- Electrical stimulation (unattended), 97035- Ultrasound, 02987- Traction (mechanical), and 97033- Ionotophoresis 4mg /ml Dexamethasone   PLAN FOR NEXT SESSION: How was biofreeze last time.  Continue to work on gentle hip AROM/strengthening and core strengthening.  NEXT MD VISIT: 07/05/24  Redell JONELLE Moose, PT,DPT 07/23/2024, 3:44 PM

## 2024-07-26 ENCOUNTER — Ambulatory Visit
Admission: RE | Admit: 2024-07-26 | Discharge: 2024-07-26 | Disposition: A | Discharge status: Home / Self Care | Attending: Family Medicine

## 2024-07-26 ENCOUNTER — Ambulatory Visit: Payer: Self-pay

## 2024-07-26 VITALS — BP 138/78 | HR 59 | Temp 98.4°F | Resp 18

## 2024-07-26 DIAGNOSIS — R300 Dysuria: Secondary | ICD-10-CM

## 2024-07-26 DIAGNOSIS — N898 Other specified noninflammatory disorders of vagina: Secondary | ICD-10-CM | POA: Diagnosis not present

## 2024-07-26 DIAGNOSIS — B372 Candidiasis of skin and nail: Secondary | ICD-10-CM | POA: Diagnosis not present

## 2024-07-26 LAB — POCT URINE DIPSTICK
Bilirubin, UA: NEGATIVE
Blood, UA: NEGATIVE
Glucose, UA: NEGATIVE mg/dL
Ketones, POC UA: NEGATIVE mg/dL
Leukocytes, UA: NEGATIVE
Nitrite, UA: NEGATIVE
POC PROTEIN,UA: >=300 — AB
Spec Grav, UA: 1.025 (ref 1.010–1.025)
Urobilinogen, UA: 0.2 U/dL
pH, UA: 6 (ref 5.0–8.0)

## 2024-07-26 MED ORDER — NYSTATIN 100000 UNIT/GM EX CREA: TOPICAL_CREAM | CUTANEOUS | 0 refills | Status: AC

## 2024-07-26 MED ORDER — FLUCONAZOLE 150 MG PO TABS: 150.0000 mg | ORAL_TABLET | Freq: Every day | ORAL | 0 refills | Status: DC

## 2024-07-26 NOTE — Discharge Instructions (Signed)
 I suspect these to be causing both the skin rash and the vaginal irritation so I have sent in a cream to treat topically to both areas as well as some pills to treat this further.  Follow-up with your primary care provider for a recheck.  Your urinalysis today does not show evidence of urinary tract infection.

## 2024-07-26 NOTE — Telephone Encounter (Signed)
 UC appt made

## 2024-07-26 NOTE — Telephone Encounter (Signed)
 FYI Only or Action Required?: FYI only for provider.  Patient was last seen in primary care on 07/08/2024 by Tobie Suzzane POUR, MD.  Called Nurse Triage reporting Dysuria and Abdominal Pain.  Symptoms began yesterday.  Interventions attempted: Nothing.  Symptoms are: unchanged.  Triage Disposition: See Physician Within 24 Hours  Patient/caregiver understands and will follow disposition?: Yes            Copied from CRM [id number]. Topic: Clinical - Red Word Triage >> Jul 26, 2024  7:58 AM Jaclyn Brooks wrote: Red Word that prompted transfer to Nurse Triage: Patient is calling to report burning with urination, and abdominal pain, symptoms for 3 days. Reason for Disposition  Age > 50 years    No access with PCP, scheduled with [facility]  Answer Assessment - Initial Assessment Questions 1. SEVERITY: How bad is the pain?  (e.g., Scale 1-10; mild, moderate, or severe)     8/10 2. FREQUENCY: How many times have you had painful urination today?      X 2  3. PATTERN: Is pain present every time you urinate or just sometimes?      Every time 4. ONSET: When did the painful urination start?      3 days 5. FEVER: Do you have a fever? If Yes, ask: What is your temperature, how was it measured, and when did it start?     Endorses tactile fever yesterday. 6. PAST UTI: Have you had a urine infection before? If Yes, ask: When was the last time? and What happened that time?      Yes, months ago 7. CAUSE: What do you think is causing the painful urination?  (e.g., UTI, scratch, Herpes sore)     UTI 8. OTHER SYMPTOMS: Do you have any other symptoms? (e.g., blood in urine, flank pain, genital sores, urgency, vaginal discharge)     Lower abd pain. Denies others 9. PREGNANCY: Is there any chance you are pregnant? When was your last menstrual period?     N/a  Protocols used: Urination Pain - Female-A-AH

## 2024-07-26 NOTE — ED Provider Notes (Signed)
 [facility]    CSN: [id number] Arrival date & time: 07/26/24  1151      History   Chief Complaint Chief Complaint  Patient presents with   Urinary Frequency    BURNING WITH URINATION - Entered by patient    HPI Jaclyn Brooks is a 63 y.o. female.   Patient presenting today with 3-day history of dysuria when the urine is passing, external irritation to the vaginal region.  Denies discharge, rashes, fevers, chills, flank pain, nausea, vomiting.  So far not tried anything over-the-counter for symptoms.  Also notes an itchy rash below her pannus.    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Patient Active Problem List   Diagnosis Date Noted   Oral candidiasis 07/08/2024   Lumbar radiculopathy 03/19/2024   Anxiety 12/21/2023   Flank pain 09/19/2023   RUQ pain 09/19/2023   Chronic sinusitis 08/29/2023   Enlarged skin mole 08/29/2023   Dysphagia 07/23/2023   Reactive airway disease 07/06/2023   Loose bowel movements 07/06/2023   Primary osteoarthritis of left hip 05/05/2023   Dysuria 04/16/2023   Diarrhea of presumed infectious origin 02/01/2023   Intractable migraine with aura without status migrainosus 08/15/2022   Traumatic hematoma of right lower leg 07/13/2022   History of total abdominal hysterectomy 07/04/2022   GAD (generalized anxiety disorder) 04/28/2022   Caregiver stress 04/28/2022   Chronic left-sided low back pain without sciatica 04/13/2022   Contact dermatitis 04/13/2022   Primary osteoarthritis of right knee 03/09/2022   Refused influenza vaccine 12/02/2021   Encounter for general adult medical examination with abnormal findings 11/30/2021   Epidermal cyst 11/30/2021   Acute recurrent frontal sinusitis 09/21/2021   Allergic rhinitis 08/29/2021   Nausea  08/27/2021   Hemorrhoids 07/07/2021   Gout 03/25/2021   Type 2 diabetes mellitus with other specified complication (HCC) 07/14/2020   Primary osteoarthritis of right foot 05/12/2020   GERD (gastroesophageal reflux disease) 05/31/2019   Solitary kidney, acquired 05/10/2019   Carotid arterial disease 12/11/2018   COPD, 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/01/2018   CKD (chronic kidney disease) stage 3, GFR 30-59 ml/min (HCC) 10/01/2018   Aortic valve sclerosis 10/01/2018   Hypertension 09/11/2018    Past Surgical History:  Procedure Laterality Date   CHOLECYSTECTOMY N/A 01/28/2015   Procedure: LAPAROSCOPIC CHOLECYSTECTOMY;  Surgeon: Oneil Budge Md, MD;  Location: [facility];  Service: General;  Laterality: N/A;   excision of bone spurs Bilateral    Big toes   renal aneurysm Right    right coil-and only has function of left kidney   TOTAL ABDOMINAL HYSTERECTOMY     X-STOP IMPLANTATION      OB History     Gravida  1   Para  1   Term      Preterm  1   AB      Living         SAB      IAB      Ectopic      Multiple      Live Births               Home Medications    Prior to Admission medications   Medication Sig Start Date End Date Taking?  Authorizing Provider  fluconazole (DIFLUCAN) 150 MG tablet Take 1 tablet (150 mg total) by mouth daily. 07/26/24  Yes Stuart Vernell Norris, PA-C  nystatin cream (MYCOSTATIN) Apply to affected area 2 times daily 07/26/24  Yes Stuart Vernell Norris, PA-C  albuterol  (VENTOLIN  HFA) 108 [phone] Base) MCG/ACT inhaler Inhale 1-2 puffs into the lungs every 6 (six) hours as needed for wheezing or shortness of breath. 07/08/24   Tobie Suzzane POUR, MD  allopurinol  (ZYLOPRIM ) 100 MG tablet Take 2 tablets by mouth once daily 12/19/23   Patel, Rutwik K, MD  atenolol  (TENORMIN ) 50 MG tablet Take 1 tablet (50 mg total) by mouth daily. 07/08/24   Tobie Suzzane POUR, MD  CARTIA  XT 240 MG 24 hr capsule Take 1 capsule (240 mg total) by mouth daily. 07/08/24   Tobie Suzzane POUR,  MD  clopidogrel  (PLAVIX ) 75 MG tablet Take 1 tablet (75 mg total) by mouth every evening. 08/29/23   Tobie Suzzane POUR, MD  dexlansoprazole  (DEXILANT ) 60 MG capsule Take 1 capsule (60 mg total) by mouth daily. 04/29/24   Stuart Vernell Norris, PA-C  fluticasone -salmeterol (ADVAIR) 100-50 MCG/ACT AEPB Inhale 1 puff into the lungs 2 (two) times daily. 01/04/24   Tobie Suzzane POUR, MD  hydrochlorothiazide  (HYDRODIURIL ) 25 MG tablet Take 1 tablet (25 mg total) by mouth daily. 07/08/24   Tobie Suzzane POUR, MD  HYDROcodone -acetaminophen  (NORCO/VICODIN) 5-325 MG tablet Take 1 tablet by mouth every 6 (six) hours as needed for severe pain (pain score 7-10). 07/10/24   Roselyn Carlin NOVAK, MD  HYDROcodone -acetaminophen  (VICODIN) 5-325 mg TABS tablet 1 tablet every 4-6 hours as needed for pain 07/10/24   Roselyn Carlin NOVAK, MD  hydrocortisone  (ANUSOL -HC) 25 MG suppository Place 1 suppository (25 mg total) rectally 2 (two) times daily. 04/29/24   Stuart Vernell Norris, PA-C  hydrOXYzine  (ATARAX ) 10 MG tablet Take 1 tablet (10 mg total) by mouth every 8 (eight) hours as needed for anxiety. 12/18/23   Chandra Harlene LABOR, NP  nystatin (MYCOSTATIN) 100000 UNIT/ML suspension Use as directed 5 mLs (500,000 Units total) in the mouth or throat 4 (four) times daily. Swish and swallow. 07/08/24   Tobie Suzzane POUR, MD  traMADol  (ULTRAM ) 50 MG tablet Take 1 tablet (50 mg total) by mouth every 12 (twelve) hours as needed. 07/08/24   Tobie Suzzane POUR, MD  atorvastatin  (LIPITOR) 20 MG tablet Take 1 tablet (20 mg total) by mouth at bedtime. 11/25/19 02/18/20  Bari Theodoro FALCON, MD    Family History Family History  Problem Relation Age of Onset   Hypertension Mother    Heart disease Mother        Cardiac Arrest    Hypertension Father    Cancer Father    Hypertension Sister    Cancer Brother        Bladder Cancer    Hypertension Brother    Hypertension Sister     Social History Social History   Tobacco Use   Smoking status:  Former    Current packs/day: 0.00    Average packs/day: 0.3 packs/day for 20.0 years (5.0 ttl pk-yrs)    Types: Cigarettes    Start date: 1999    Quit date: 2019    Years since quitting: 6.7   Smokeless tobacco: Never  Vaping Use   Vaping status: Never Used  Substance Use Topics   Alcohol use: Not Currently    Comment: occasionally   Drug use: No     Allergies   Amoxil  [amoxicillin ], Bee venom, Nsaids,  Shellfish allergy, and Latex   Review of Systems Review of Systems PER HPI  Physical Exam Triage Vital Signs ED Triage Vitals  Encounter Vitals Group     BP 07/26/24 1208 138/78     Girls Systolic BP Percentile --      Girls Diastolic BP Percentile --      Boys Systolic BP Percentile --      Boys Diastolic BP Percentile --      Pulse Rate 07/26/24 1208 (!) 59     Resp 07/26/24 1208 18     Temp 07/26/24 1208 98.4 F (36.9 C)     Temp src --      SpO2 07/26/24 1208 97 %     Weight --      Height --      Head Circumference --      Peak Flow --      Pain Score 07/26/24 1209 8     Pain Loc --      Pain Education --      Exclude from Growth Chart --    No data found.  Updated Vital Signs BP 138/78 (BP Location: Right Arm)   Pulse (!) 59   Temp 98.4 F (36.9 C)   Resp 18   SpO2 97%   Visual Acuity Right Eye Distance:   Left Eye Distance:   Bilateral Distance:    Right Eye Near:   Left Eye Near:    Bilateral Near:     Physical Exam Vitals and nursing note reviewed.  Constitutional:      Appearance: Normal appearance. She is not ill-appearing.  HENT:     Head: Atraumatic.  Eyes:     Extraocular Movements: Extraocular movements intact.     Conjunctiva/sclera: Conjunctivae normal.  Cardiovascular:     Rate and Rhythm: Normal rate.  Pulmonary:     Effort: Pulmonary effort is normal.  Abdominal:     General: Bowel sounds are normal. There is no distension.     Palpations: Abdomen is soft.     Tenderness: There is no abdominal tenderness. There is  no right CVA tenderness, left CVA tenderness or guarding.  Musculoskeletal:        General: Normal range of motion.     Cervical back: Normal range of motion and neck supple.  Skin:    General: Skin is warm and dry.     Findings: Rash present.     Comments: Hyperpigmented and macerated rash to the fold below the pannus  Neurological:     Mental Status: She is alert and oriented to person, place, and time.  Psychiatric:        Mood and Affect: Mood normal.        Thought Content: Thought content normal.        Judgment: Judgment normal.    UC Treatments / Results  Labs (all labs ordered are listed, but only abnormal results are displayed) Labs Reviewed  POCT URINE DIPSTICK - Abnormal; Notable for the following components:      Result Value   POC PROTEIN,UA >=300 (*)    All other components within normal limits    EKG   Radiology No results found.  Procedures Procedures (including critical care time)  Medications Ordered in UC Medications - No data to display  Initial Impression / Assessment and Plan / UC Course  I have reviewed the triage vital signs and the nursing notes.  Pertinent labs & imaging results that were available during  my care of the patient were reviewed by me and considered in my medical decision making (see chart for details).     Urinalysis today without evidence of urinary tract infection, positive for proteinuria which is ongoing for her given her chronic kidney disease.  Continue to push fluids, suspect possible yeast infection and irritation causing her dysuria.  She does also have some yeast dermatitis.  Diflucan, nystatin cream, supportive over-the-counter medications and home care.  Return for worsening symptoms.  Final Clinical Impressions(s) / UC Diagnoses   Final diagnoses:  Dysuria  Vaginal irritation  Yeast dermatitis     Discharge Instructions      I suspect these to be causing both the skin rash and the vaginal irritation so I  have sent in a cream to treat topically to both areas as well as some pills to treat this further.  Follow-up with your primary care provider for a recheck.  Your urinalysis today does not show evidence of urinary tract infection.    ED Prescriptions     Medication Sig Dispense Auth. Provider   nystatin cream (MYCOSTATIN) Apply to affected area 2 times daily 80 g Stuart Vernell Norris, PA-C   fluconazole (DIFLUCAN) 150 MG tablet Take 1 tablet (150 mg total) by mouth daily. 3 tablet Stuart Vernell Norris, [place]      PDMP not reviewed this encounter.   Stuart Vernell Norris, [place] 07/26/24 1244

## 2024-07-26 NOTE — ED Triage Notes (Signed)
 Burning on urination and lower abd pain x 3 days.

## 2024-07-30 ENCOUNTER — Ambulatory Visit: Admitting: Physical Therapy

## 2024-07-31 ENCOUNTER — Ambulatory Visit: Payer: Self-pay

## 2024-07-31 ENCOUNTER — Encounter: Payer: Self-pay | Admitting: Internal Medicine

## 2024-07-31 ENCOUNTER — Ambulatory Visit: Admitting: Internal Medicine

## 2024-07-31 ENCOUNTER — Encounter: Admitting: Physical Medicine and Rehabilitation

## 2024-07-31 VITALS — BP 138/80 | HR 66 | Ht 66.0 in | Wt 176.2 lb

## 2024-07-31 DIAGNOSIS — E1169 Type 2 diabetes mellitus with other specified complication: Secondary | ICD-10-CM

## 2024-07-31 DIAGNOSIS — Z7984 Long term (current) use of oral hypoglycemic drugs: Secondary | ICD-10-CM

## 2024-07-31 DIAGNOSIS — E1159 Type 2 diabetes mellitus with other circulatory complications: Secondary | ICD-10-CM | POA: Diagnosis not present

## 2024-07-31 DIAGNOSIS — M1A0690 Idiopathic chronic gout, unspecified knee, without tophus (tophi): Secondary | ICD-10-CM

## 2024-07-31 DIAGNOSIS — E1122 Type 2 diabetes mellitus with diabetic chronic kidney disease: Secondary | ICD-10-CM | POA: Diagnosis not present

## 2024-07-31 DIAGNOSIS — N1832 Chronic kidney disease, stage 3b: Secondary | ICD-10-CM | POA: Diagnosis not present

## 2024-07-31 MED ORDER — PREDNISONE 20 MG PO TABS: ORAL_TABLET | ORAL | 0 refills | Status: AC

## 2024-07-31 NOTE — Progress Notes (Signed)
 Acute Office Visit  Subjective:    Patient ID: Jaclyn Brooks, female    DOB: 1961/02/09, 63 y.o.   MRN: [id number]  Chief Complaint  Patient presents with   Gout    Pt has right ankle pain from gout. Ongoing for 3 days.     HPI Patient is in today for complaint of right ankle swelling and pain for the last 3 days.  She has a history of gout, takes allopurinol  200 mg QD.  She had gout flareup recently about 3 weeks ago, when she went to ER and was given Sterapred taper.  She had felt improvement in her pain at that time.  Does not consume alcohol.  She is usually very careful about her diet.  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Past Surgical History:  Procedure Laterality Date   CHOLECYSTECTOMY N/A 01/28/2015   Procedure: LAPAROSCOPIC CHOLECYSTECTOMY;  Surgeon: Oneil Budge Md, MD;  Location: [facility];  Service: General;  Laterality: N/A;   excision of bone spurs Bilateral    Big toes   renal aneurysm Right    right coil-and only has function of left kidney   TOTAL ABDOMINAL HYSTERECTOMY     X-STOP IMPLANTATION      Family History  Problem Relation Age of Onset   Hypertension Mother    Heart disease Mother        Cardiac Arrest    Hypertension Father    Cancer Father    Hypertension Sister    Cancer Brother        Bladder Cancer    Hypertension Brother    Hypertension Sister     Social History   Socioeconomic History   Marital status: Unknown    Spouse name: Not on file   Number of children: 1   Years of education: 12   Highest education level: Some college, no degree  Occupational History   Occupation: Retired  Tobacco Use   Smoking status: Former    Current packs/day: 0.00    Average packs/day: 0.3 packs/day for 20.0 years (5.0 ttl pk-yrs)    Types: Cigarettes    Start  date: 1999    Quit date: 2019    Years since quitting: 6.8   Smokeless tobacco: Never  Vaping Use   Vaping status: Never Used  Substance and Sexual Activity   Alcohol use: Not Currently    Comment: occasionally   Drug use: No   Sexual activity: Yes    Birth control/protection: Surgical  Other Topics Concern   Not on file  Social History Narrative   Not on file   Social Drivers of Health   Financial Resource Strain: Low Risk  (02/15/2024)   Overall Financial Resource Strain (CARDIA)    Difficulty of Paying Living Expenses: Not hard at all  Food Insecurity: No Food Insecurity (02/15/2024)   Hunger Vital Sign    Worried About Running Out of Food in the Last Year: Never true    Ran Out of Food in the Last Year: Never true  Transportation Needs: No Transportation Needs (02/15/2024)   PRAPARE - Administrator, Civil Service (Medical): No    Lack of Transportation (Non-Medical): No  Physical Activity: Inactive (02/15/2024)  Exercise Vital Sign    Days of Exercise per Week: 0 days    Minutes of Exercise per Session: 0 min  Stress: No Stress Concern Present (02/15/2024)   [employer] of Occupational Health - Occupational Stress Questionnaire    Feeling of Stress : Not at all  Social Connections: Socially Isolated (02/15/2024)   Social Connection and Isolation Panel    Frequency of Communication with Friends and Family: More than three times a week    Frequency of Social Gatherings with Friends and Family: More than three times a week    Attends Religious Services: Never    Database administrator or Organizations: No    Attends Banker Meetings: Never    Marital Status: Never married  Intimate Partner Violence: Not At Risk (02/15/2024)   Humiliation, Afraid, Rape, and Kick questionnaire    Fear of Current or Ex-Partner: No    Emotionally Abused: No    Physically Abused: No    Sexually Abused: No    Outpatient Medications Prior to Visit  Medication Sig  Dispense Refill   albuterol  (VENTOLIN  HFA) 108 (90 Base) MCG/ACT inhaler Inhale 1-2 puffs into the lungs every 6 (six) hours as needed for wheezing or shortness of breath. 18 each 2   allopurinol  (ZYLOPRIM ) 100 MG tablet Take 2 tablets by mouth once daily 60 tablet 11   atenolol  (TENORMIN ) 50 MG tablet Take 1 tablet (50 mg total) by mouth daily. 90 tablet 3   CARTIA  XT 240 MG 24 hr capsule Take 1 capsule (240 mg total) by mouth daily. 90 capsule 3   clopidogrel  (PLAVIX ) 75 MG tablet Take 1 tablet (75 mg total) by mouth every evening. 90 tablet 3   dexlansoprazole  (DEXILANT ) 60 MG capsule Take 1 capsule (60 mg total) by mouth daily. 30 capsule 0   FARXIGA 10 MG TABS tablet Take 10 mg by mouth daily.     fluticasone -salmeterol (ADVAIR) 100-50 MCG/ACT AEPB Inhale 1 puff into the lungs 2 (two) times daily. 60 each 2   hydrochlorothiazide  (HYDRODIURIL ) 25 MG tablet Take 1 tablet (25 mg total) by mouth daily. 90 tablet 3   hydrocortisone  (ANUSOL -HC) 25 MG suppository Place 1 suppository (25 mg total) rectally 2 (two) times daily. 12 suppository 0   hydrOXYzine  (ATARAX ) 10 MG tablet Take 1 tablet (10 mg total) by mouth every 8 (eight) hours as needed for anxiety. 20 tablet 0   nystatin (MYCOSTATIN) 100000 UNIT/ML suspension Use as directed 5 mLs (500,000 Units total) in the mouth or throat 4 (four) times daily. Swish and swallow. 120 mL 0   nystatin cream (MYCOSTATIN) Apply to affected area 2 times daily 80 g 0   traMADol  (ULTRAM ) 50 MG tablet Take 1 tablet (50 mg total) by mouth every 12 (twelve) hours as needed. 30 tablet 0   dapagliflozin propanediol (FARXIGA) 5 MG TABS tablet Take 5 mg by mouth daily.     fluconazole (DIFLUCAN) 150 MG tablet Take 1 tablet (150 mg total) by mouth daily. 3 tablet 0   HYDROcodone -acetaminophen  (NORCO/VICODIN) 5-325 MG tablet Take 1 tablet by mouth every 6 (six) hours as needed for severe pain (pain score 7-10). 12 tablet 0   HYDROcodone -acetaminophen  (VICODIN) 5-325 mg  TABS tablet 1 tablet every 4-6 hours as needed for pain 6 tablet 0   No facility-administered medications prior to visit.    Allergies  Allergen Reactions   Amoxil  [Amoxicillin ] Hives   Bee Venom Hives   Nsaids  Kidney disease    Shellfish Allergy Itching   Latex Rash    Pt states she gets blisters     Review of Systems  Constitutional:  Negative for chills and fever.  HENT:  Negative for congestion, sinus pressure and sinus pain.   Eyes:  Negative for pain and discharge.  Respiratory:  Negative for cough and shortness of breath.   Cardiovascular:  Negative for chest pain and palpitations.  Gastrointestinal:  Negative for abdominal pain, diarrhea and vomiting.  Endocrine: Negative for polydipsia and polyuria.  Genitourinary:  Negative for dysuria and hematuria.  Musculoskeletal:  Positive for arthralgias, back pain and neck pain. Negative for neck stiffness.       Right ankle swelling  Skin:  Negative for rash.  Neurological:  Negative for dizziness and weakness.  Psychiatric/Behavioral:  Positive for agitation. Negative for behavioral problems. The patient is nervous/anxious.        Objective:    Physical Exam Vitals reviewed.  Constitutional:      General: She is not in acute distress.    Appearance: She is not diaphoretic.  HENT:     Head: Normocephalic and atraumatic.     Mouth/Throat:     Mouth: Mucous membranes are moist.  Eyes:     General: No scleral icterus.    Extraocular Movements: Extraocular movements intact.  Cardiovascular:     Rate and Rhythm: Normal rate and regular rhythm.     Heart sounds: Normal heart sounds. No murmur heard. Pulmonary:     Breath sounds: Normal breath sounds. No wheezing or rales.  Musculoskeletal:     Cervical back: Neck supple. No tenderness.     Lumbar back: Tenderness present. Decreased range of motion.     Right lower leg: No edema.     Left lower leg: No edema.  Skin:    General: Skin is warm.     Findings: No  rash.  Neurological:     General: No focal deficit present.     Mental Status: She is alert and oriented to person, place, and time.  Psychiatric:        Mood and Affect: Mood normal.        Behavior: Behavior normal.     BP 138/80 (BP Location: Left Arm)   Pulse 66   Ht 5' 6 (1.676 m)   Wt 176 lb 3.2 oz (79.9 kg)   SpO2 100%   BMI 28.44 kg/m  Wt Readings from Last 3 Encounters:  07/31/24 176 lb 3.2 oz (79.9 kg)  07/10/24 181 lb (82.1 kg)  07/08/24 181 lb 6.4 oz (82.3 kg)        Assessment & Plan:   Problem List Items Addressed This Visit       Endocrine   Type 2 diabetes mellitus with other specified complication (HCC)   Lab Results  Component Value Date   HGBA1C 6.4 (H) 01/04/2024   Usually diet controlled Recently started Farxiga for proteinuria Associated with HTN, HLD and CKD Check HbA1c Not on statin, does not prefer to take any new medicine      Relevant Medications   FARXIGA 10 MG TABS tablet   Other Relevant Orders   Bayer DCA Hb A1c Waived     Genitourinary   CKD (chronic kidney disease) stage 3, GFR 30-59 ml/min (HCC)   Last BMP showed GFR 38 in 10/25, usually stays around 35 Follows up with Nephrologist - Dr. Jerrye, last visit note reviewed Avoid nephrotoxic agents Maintain adequate  hydration Recently placed on Farxiga for proteinuria        Other   Gout - Primary   Right ankle swelling and pain likely due to gout Started oral prednisone  taper Had joint fluid aspiration in the past- showed monosodium urate crystals On Allopurinol  200 mg once daily Explained that if she has recurrent gout, may need to discontinue HCTZ, but she is hesitant about it Advised to discuss with Nephrology to check uric acid level, she prefers to do blood tests with Nephrology      Relevant Medications   predniSONE  (DELTASONE ) 20 MG tablet     Meds ordered this encounter  Medications   predniSONE  (DELTASONE ) 20 MG tablet    Sig: Take 2 tablets (40 mg  total) by mouth daily with breakfast for 2 days, THEN 1 tablet (20 mg total) daily with breakfast for 2 days, THEN 0.5 tablets (10 mg total) daily with breakfast for 2 days.    Dispense:  7 tablet    Refill:  0     Camarion Weier MARLA Blanch, MD

## 2024-07-31 NOTE — Assessment & Plan Note (Addendum)
 Right ankle swelling and pain likely due to gout Started oral prednisone  taper Had joint fluid aspiration in the past- showed monosodium urate crystals On Allopurinol  200 mg once daily Explained that if she has recurrent gout, may need to discontinue HCTZ, but she is hesitant about it Advised to discuss with Nephrology to check uric acid level, she prefers to do blood tests with Nephrology

## 2024-07-31 NOTE — Telephone Encounter (Signed)
 FYI Only or Action Required?: FYI only for provider.  Patient was last seen in primary care on 07/08/2024 by Tobie Suzzane POUR, MD.  Called Nurse Triage reporting Ankle Pain.  Symptoms began x 3 days.  Interventions attempted: OTC medications: Tylenol .  Symptoms are: unchanged.  Triage Disposition: See HCP Within 4 Hours (Or PCP Triage)  Patient/caregiver understands and will follow disposition?: Yes  **Referred to UC, see note below**       Copied from CRM #[id number]. Topic: Clinical - Red Word Triage >> Jul 31, 2024  8:03 AM Donna BRAVO wrote: Red Word that prompted transfer to Nurse Triage: patient right ankle pain gout pain and is asking for medication to be sent to pharmacy. Reason for Disposition  [1] SEVERE pain (e.g., excruciating, unable to walk) AND [2] not improved after 2 hours of pain medicine  Answer Assessment - Initial Assessment Questions 1. ONSET: When did the pain start?      X 3 days   2. LOCATION: Where is the pain located?      Right ankle   3. PAIN: How bad is the pain?  (Scale 1-10; or mild, moderate, severe)     9/10  4. WORK OR EXERCISE: Has there been any recent work or exercise that involved this part of the body?      No   5. CAUSE: What do you think is causing the ankle pain?     Unsure   6. OTHER SYMPTOMS: Do you have any other symptoms? (e.g., calf pain, rash, fever, swelling)  Moderate-Severe ankle swelling, redness, warm to touch, painful to touch  Patient seen in ED on 10/1 for same symptoms. For pain she is taking Tylenol , referred to UC as there is no appt. Availability per Epic until 10/24. Pt. Agrees with plan of care.  Protocols used: Ankle Pain-A-AH

## 2024-07-31 NOTE — Assessment & Plan Note (Addendum)
 Last BMP showed GFR 38 in 10/25, usually stays around 35 Follows up with Nephrologist - Dr. Jerrye, last visit note reviewed Avoid nephrotoxic agents Maintain adequate hydration Recently placed on Farxiga for proteinuria

## 2024-07-31 NOTE — Assessment & Plan Note (Signed)
 Lab Results  Component Value Date   HGBA1C 6.4 (H) 01/04/2024   Usually diet controlled Recently started Farxiga for proteinuria Associated with HTN, HLD and CKD Check HbA1c Not on statin, does not prefer to take any new medicine

## 2024-07-31 NOTE — Patient Instructions (Addendum)
 Please start taking Prednisone  as prescribed.  Please continue taking Allopurinol  as prescribed.

## 2024-08-01 ENCOUNTER — Ambulatory Visit: Admitting: Physical Therapy

## 2024-08-08 ENCOUNTER — Encounter: Payer: Self-pay | Admitting: Physical Therapy

## 2024-08-08 ENCOUNTER — Ambulatory Visit: Admitting: Physical Therapy

## 2024-08-08 DIAGNOSIS — M5459 Other low back pain: Secondary | ICD-10-CM

## 2024-08-08 DIAGNOSIS — M25552 Pain in left hip: Secondary | ICD-10-CM

## 2024-08-08 DIAGNOSIS — R262 Difficulty in walking, not elsewhere classified: Secondary | ICD-10-CM

## 2024-08-08 DIAGNOSIS — M6281 Muscle weakness (generalized): Secondary | ICD-10-CM

## 2024-08-08 NOTE — Progress Notes (Signed)
 Jaclyn Brooks                                          MRN: [id number]   08/08/2024   The [facility] Quality Team Specialist reviewed this patient medical record for the purposes of chart review for care gap closure. The following were reviewed: abstraction for care gap closure-glycemic status assessment.    [facility] Quality Team

## 2024-08-08 NOTE — Therapy (Signed)
 OUTPATIENT PHYSICAL THERAPY TREATMENT   Patient Name: Jaclyn Brooks MRN: [id number] DOB:[birth year], 63 y.o., female Today's Date: 08/08/2024  END OF SESSION:  PT End of Session - 08/08/24 1417     Visit Number 6    Number of Visits 10    Date for Recertification  07/04/24    Authorization Type [employer]    Authorization Time Period 6 visits covered (05/13/2024-06/20/2024)    Authorization - Visit Number 5    Authorization - Number of Visits 6    Progress Note Due on Visit 10    PT Start Time 1418    PT Stop Time 1458    PT Time Calculation (min) 40 min    Activity Tolerance Patient tolerated treatment well    Behavior During Therapy [facility] for tasks assessed/performed            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Past Surgical History:  Procedure Laterality Date   CHOLECYSTECTOMY N/A 01/28/2015   Procedure: LAPAROSCOPIC CHOLECYSTECTOMY;  Surgeon: Oneil Budge Md, MD;  Location: [facility];  Service: General;  Laterality: N/A;   excision of bone spurs Bilateral    Big toes   renal aneurysm Right    right coil-and only has function of left kidney   TOTAL ABDOMINAL HYSTERECTOMY     X-STOP IMPLANTATION     Patient Active Problem List   Diagnosis Date Noted   Oral candidiasis 07/08/2024   Lumbar radiculopathy 03/19/2024   Anxiety 12/21/2023   Flank pain 09/19/2023   RUQ pain 09/19/2023   Chronic sinusitis 08/29/2023   Enlarged skin mole 08/29/2023   Dysphagia 07/23/2023   Reactive airway disease 07/06/2023   Loose bowel movements 07/06/2023   Primary osteoarthritis of left hip 05/05/2023   Dysuria 04/16/2023   Diarrhea of presumed infectious origin 02/01/2023   Intractable migraine with aura without status migrainosus 08/15/2022   Traumatic hematoma of right lower leg 07/13/2022    History of total abdominal hysterectomy 07/04/2022   GAD (generalized anxiety disorder) 04/28/2022   Caregiver stress 04/28/2022   Chronic left-sided low back pain without sciatica 04/13/2022   Contact dermatitis 04/13/2022   Primary osteoarthritis of right knee 03/09/2022   Refused influenza vaccine 12/02/2021   Encounter for general adult medical examination with abnormal findings 11/30/2021   Epidermal cyst 11/30/2021   Acute recurrent frontal sinusitis 09/21/2021   Allergic rhinitis 08/29/2021   Nausea 08/27/2021   Hemorrhoids 07/07/2021   Gout 03/25/2021   Type 2 diabetes mellitus with other specified complication (HCC) 07/14/2020   Primary osteoarthritis of right foot 05/12/2020   GERD (gastroesophageal reflux disease) 05/31/2019   Solitary kidney, acquired 05/10/2019   Carotid arterial disease 12/11/2018   COPD, 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/01/2018   CKD (chronic kidney disease) stage 3, GFR 30-59 ml/min (HCC) 10/01/2018   Aortic valve sclerosis 10/01/2018   Hypertension 09/11/2018    PCP: Tobie Suzzane POUR, MD   REFERRING PROVIDER: Shirly Carlin CROME, PA-C   REFERRING DIAG: M54.16 (ICD-10-CM) - Lumbar radiculopathy   Rationale for Evaluation and Treatment:  Rehabiliation  THERAPY DIAG:  Other low back pain  Pain in left hip  Muscle weakness (generalized)  Difficulty in walking, not elsewhere classified  ONSET DATE: End of April  2025   SUBJECTIVE:                                                                                                                                                                                           SUBJECTIVE STATEMENT: States the pain has been bad for one month now and is getting worse. She was supposed to have back injections but she had gout and could not drive to get them. This is now supposed to happen 08/12/24  PERTINENT HISTORY:  See above PMH  PAIN:  NPRS scale: 8.5/10 upon arrival Pain location:left hip now, she had shot in her  low back which helped the pain Pain description: comes and goes, tingling, dull Aggravating factors: walking,  Relieving factors: rest, meds   PRECAUTIONS: ,  None  RED FLAGS: None   WEIGHT BEARING RESTRICTIONS:  No  FALLS:  Has patient fallen in last 6 months? No   OCCUPATION:  Works for [employer]  PLOF:  Independent  PATIENT GOALS:  Reduce pain  OBJECTIVE:  Note: Objective measures were completed at Evaluation unless otherwise noted.  DIAGNOSTIC FINDINGS:  Lumbar CT IMPRESSION: 1. Grade 1 degenerative anterolisthesis at L4-5 is worse with standing and exacerbated by flexion. 2. Moderate right subarticular and severe right foraminal stenosis at L4-5. 3. Moderate left foraminal narrowing at L4-5. 4. Rightward disc protrusion at L5-S1 likely contacts the traversing right S1 nerve roots. 5. Moderate foraminal stenosis bilaterally at L5-S1 is worse left than right. 6. Mild facet hypertrophy at L1-2 and L2-3 without significant stenosis. 7. 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 8. Asymmetric right-sided renal atrophy.  PATIENT SURVEYS:  Patient-Specific Activity Scoring Scheme  0 represents "unable to perform." 10 represents "able to perform at prior level. 0 1 2 3 4 5 6 7 8 9  10 (Date and Score)   Activity Eval  08/08/24   1. Walking 20 minutes 2     2. Sitting for one hour  2    3.     4.    5.    Score 2/10    Total score = sum of the activity scores/number of activities Minimum detectable change (90%CI) for average score = 2 points Minimum detectable change (90%CI) for single activity score = 3 points   GAIT: Distance walked: Can walk up to 20 minutes max before too much pain Assistive device utilized: None Level of assistance: Complete Independence Comments: antalgic gait on left, lower gait speed   LUMBAR ROM:   Active  AROM  eval  Flexion 50*  Extension 10*  Right lateral flexion 10  Left lateral flexion 10*  Right rotation 50%  Left rotation 25%   (Blank rows = not tested) *notes pain   LOWER EXTREMITY MMT:    MMT Right eval Left eval  Hip flexion 5 4  Hip extension    Hip abduction 5 4  Hip adduction 5 4  Hip internal rotation    Hip external rotation    Knee flexion 5 5  Knee extension 5 5  Ankle dorsiflexion 5 5  Ankle plantarflexion    Ankle inversion    Ankle eversion     (Blank rows = not tested)    SPECIAL TESTS:  Lumbar Straight leg raise test: Positive and Slump test: Positive                                                                                                                             TREATMENT DATE:  08/08/24 Therex: Nustep L4 x 10 min UEs/LEs with moist heat to low back Supine LTR 5 sec X 10 bilat Supine piriformis stretch 30 sec X 3 bilat  KT tape for lumbar support, 2 vertical I strips up lumbar P.S  Nmed: Seated sciatica stretch 3 sec X 10 bilat Standing lumbar ext against wall 2x10 Supine hip adduciton isometric ball squeeze 5 sec X 10 Supine clamshell resisted iso red TB 10x5 Supine pelvic tilt x10 Supine PPT + marching x10     PATIENT EDUCATION: Education details: HEP, keeping weight shift equal if her leg isn't hurting too much Person educated: Patient Education method: Explanation, Demonstration, Verbal cues, and Handouts Education comprehension: verbalized understanding, further education recommended   HOME EXERCISE PROGRAM: Access Code: [email or web address] URL: [email or web address] Date: 06/03/2024 Prepared by: Gellen April Earnie Starring  Exercises - Standing Lumbar Extension at [employer] - Forearms  - 2 x daily - 6 x weekly - 1 sets - 10 reps - 3 sec hold - Slump Stretch  - 2 x daily - 6 x weekly - 1 sets - 10 reps - 3 sec hold - supine IT band and piriformis stretch  - 2 x daily - 6 x weekly - 1 sets - 3 reps - 30 hold - Supine Lower Trunk Rotation  - 2 x daily - 6 x weekly - 1 sets - 5 reps - 10 sec hold - Left  Standing Lateral Shift Correction at Wall - Repetitions  - 1 x daily - 7 x weekly - 1 sets - 10 reps - Supine Posterior Pelvic Tilt  - 1 x daily - 7 x weekly - 2 sets - 10 reps - 3 sec hold - Supine Hip Abduction  - 1 x daily - 7 x weekly - 2 sets - 10 reps  ASSESSMENT:  CLINICAL IMPRESSION: She has one more visit left so PT will update measurements and goals to see if any objective functional progress has been made but she has been very limited by pain lately.  OBJECTIVE IMPAIRMENTS: decreased activity tolerance for ADL's, difficulty walking,  decreased endurance,  decreased mobility, decreased ROM, decreased strength, impaired flexibility, impaired LE use, and pain.  ACTIVITY LIMITATIONS: bending, lifting, carry, reaching, locomotion, cleaning, community activity, driving, and or occupation  PERSONAL FACTORS: see above PMH  also affecting patient's functional outcome.  REHAB POTENTIAL: Good  CLINICAL DECISION MAKING: Stable/uncomplicated  EVALUATION COMPLEXITY: Low    GOALS: Short term PT Goals Target date: 06/06/2024   Pt will be I and compliant with HEP. Baseline:  Goal status: PARTIALLY MET 06/24/24 Pt will decrease pain by 25% overall Baseline: Goal status: IN PROGRESS 06/24/24  Long term PT goals Target date:07/04/2024   Pt will improve lumbar ROM to [facility] to improve functional mobility Baseline: Goal status: New Pt will improve  left leg strength to at least 4+/5 MMT to improve functional strength Baseline: Goal status: New Pt will improve Patient specific functional scale (PSFS) to at least 7/10 to show improved function level Baseline: Goal status: New Pt will reduce pain to overall less than 3/10 with usual activity and work activity. Baseline: Goal status: New Pt will be able to ambulate community distances at least 1000 ft WNL gait pattern without complaints Baseline: Goal status: New  PLAN: PT FREQUENCY: 1-2 times per week   PT DURATION: 6-8  weeks  PLANNED INTERVENTIONS (unless contraindicated): 97110-Therapeutic exercises, 97530- Therapeutic activity, [id number]- Neuromuscular re-education, 97535- Self Care, 02859- Manual therapy, [id number]- Electrical stimulation (unattended), 97035- Ultrasound, 02987- Traction (mechanical), and 97033- Ionotophoresis 4mg /ml Dexamethasone   PLAN FOR NEXT SESSION: reassessment to see if any progress has been made as last visit NEXT MD VISIT: 07/05/24  Redell JONELLE Moose, PT,DPT 08/08/2024, 3:03 PM

## 2024-08-09 ENCOUNTER — Telehealth: Payer: Self-pay | Admitting: Orthopedic Surgery

## 2024-08-09 NOTE — Telephone Encounter (Signed)
Jaclyn Brooks spoke with patient  

## 2024-08-09 NOTE — Telephone Encounter (Signed)
 I called patient. She states that she has moved to long term disability and the [employer] company has requested her records. She states that it has not updated that they have sent them. She asked if I could send them in, and I explained, no, this is a third party and it has to go through HIM for [facility]. I will call [phone] to check status and will then return call to pt.

## 2024-08-09 NOTE — Telephone Encounter (Signed)
Patient called. Would like Betsy to call her.

## 2024-08-12 ENCOUNTER — Ambulatory Visit: Admitting: Physical Medicine and Rehabilitation

## 2024-08-12 ENCOUNTER — Telehealth: Payer: Self-pay | Admitting: Surgical

## 2024-08-12 ENCOUNTER — Encounter: Payer: Self-pay | Admitting: Radiology

## 2024-08-12 ENCOUNTER — Other Ambulatory Visit: Payer: Self-pay

## 2024-08-12 VITALS — BP 141/77 | HR 51

## 2024-08-12 DIAGNOSIS — M5416 Radiculopathy, lumbar region: Secondary | ICD-10-CM

## 2024-08-12 MED ORDER — METHYLPREDNISOLONE ACETATE 40 MG/ML IJ SUSP
40.0000 mg | Freq: Once | INTRAMUSCULAR | Status: AC
  Administered 2024-08-12: 40 mg

## 2024-08-12 NOTE — Telephone Encounter (Signed)
 Received vm from patient stating that [facility] medical records advised her that they need an authorization from [employer] requesting Dr. Eldonna records. The previous request only requested [facility] records. IC patient,lmvm advised her that she needs to contact [employer] and have them send a separate request for [facility] records. That request and any request for medical records go to [facility] department as they are no longer processed here at our office.

## 2024-08-12 NOTE — Progress Notes (Unsigned)
 Pain Scale   Average Pain 7 Patient advising she has chronic lower back pain radiating to left leg and pain is constant.Patient advising she Stopped her Plavix  on 08/09/24 as instructed.        +Driver, -BT, -Dye Allergies.

## 2024-08-14 NOTE — Procedures (Signed)
 Lumbar Epidural Steroid Injection - Interlaminar Approach with Fluoroscopic Guidance  Patient: Jaclyn Brooks      Date of Birth: February 05, 1961 MRN: [id number] PCP: Tobie Suzzane POUR, MD      Visit Date: 08/12/2024   Universal Protocol:     Consent Given By: the patient  Position: PRONE  Additional Comments: Vital signs were monitored before and after the procedure. Patient was prepped and draped in the usual sterile fashion. The correct patient, procedure, and site was verified.   Injection Procedure Details:   Procedure diagnoses: Lumbar radiculopathy [M54.16]   Meds Administered:  Meds ordered this encounter  Medications   methylPREDNISolone  acetate (DEPO-MEDROL ) injection 40 mg     Laterality: Left  Location/Site:  L5-S1  Needle: 3.5 in., 20 ga. Tuohy  Needle Placement: Paramedian epidural  Findings:   -Comments: Excellent flow of contrast into the epidural space.  Procedure Details: Using a paramedian approach from the side mentioned above, the region overlying the inferior lamina was localized under fluoroscopic visualization and the soft tissues overlying this structure were infiltrated with 4 ml. of 1% Lidocaine  without Epinephrine. The Tuohy needle was inserted into the epidural space using a paramedian approach.   The epidural space was localized using loss of resistance along with counter oblique bi-planar fluoroscopic views.  After negative aspirate for air, blood, and CSF, a 2 ml. volume of Isovue -250 was injected into the epidural space and the flow of contrast was observed. Radiographs were obtained for documentation purposes.    The injectate was administered into the level noted above.   Additional Comments:  The patient tolerated the procedure well Dressing: 2 x 2 sterile gauze and Band-Aid    Post-procedure details: Patient was observed during the procedure. Post-procedure instructions were reviewed.  Patient left the clinic in stable condition.

## 2024-08-14 NOTE — Progress Notes (Signed)
 Jaclyn Brooks - 63 y.o. female MRN [id number]  Date of birth: May 09, 1961  Office Visit Note: Visit Date: 08/12/2024 PCP: Tobie Suzzane POUR, MD Referred by: Tobie Suzzane POUR, MD  Subjective: Chief Complaint  Patient presents with   Lower Back - Pain   HPI:  Jaclyn Brooks is a 63 y.o. female who comes in today at the request of Herlene Calix, PA-C for planned Left L5-S1 Lumbar Interlaminar epidural steroid injection with fluoroscopic guidance.  The patient has failed conservative care including home exercise, medications, time and activity modification.  This injection will be diagnostic and hopefully therapeutic.  Please see requesting physician notes for further details and justification.   ROS Otherwise per HPI.  Assessment & Plan: Visit Diagnoses:    ICD-10-CM   1. Lumbar radiculopathy  M54.16 XR C-ARM NO REPORT    Epidural Steroid injection    methylPREDNISolone  acetate (DEPO-MEDROL ) injection 40 mg      Plan: No additional findings.   Meds & Orders:  Meds ordered this encounter  Medications   methylPREDNISolone  acetate (DEPO-MEDROL ) injection 40 mg    Orders Placed This Encounter  Procedures   XR C-ARM NO REPORT   Epidural Steroid injection    Follow-up: Return for visit to requesting provider as needed.   Procedures: No procedures performed  Lumbar Epidural Steroid Injection - Interlaminar Approach with Fluoroscopic Guidance  Patient: Jaclyn Brooks      Date of Birth: 30-Jul-1961 MRN: [id number] PCP: Tobie Suzzane POUR, MD      Visit Date: 08/12/2024   Universal Protocol:     Consent Given By: the patient  Position: PRONE  Additional Comments: Vital signs were monitored before and after the procedure. Patient was prepped and draped in the usual sterile fashion. The correct patient, procedure, and site was verified.   Injection Procedure Details:   Procedure diagnoses: Lumbar radiculopathy [M54.16]   Meds Administered:  Meds ordered this encounter   Medications   methylPREDNISolone  acetate (DEPO-MEDROL ) injection 40 mg     Laterality: Left  Location/Site:  L5-S1  Needle: 3.5 in., 20 ga. Tuohy  Needle Placement: Paramedian epidural  Findings:   -Comments: Excellent flow of contrast into the epidural space.  Procedure Details: Using a paramedian approach from the side mentioned above, the region overlying the inferior lamina was localized under fluoroscopic visualization and the soft tissues overlying this structure were infiltrated with 4 ml. of 1% Lidocaine  without Epinephrine. The Tuohy needle was inserted into the epidural space using a paramedian approach.   The epidural space was localized using loss of resistance along with counter oblique bi-planar fluoroscopic views.  After negative aspirate for air, blood, and CSF, a 2 ml. volume of Isovue -250 was injected into the epidural space and the flow of contrast was observed. Radiographs were obtained for documentation purposes.    The injectate was administered into the level noted above.   Additional Comments:  The patient tolerated the procedure well Dressing: 2 x 2 sterile gauze and Band-Aid    Post-procedure details: Patient was observed during the procedure. Post-procedure instructions were reviewed.  Patient left the clinic in stable condition.   Clinical History: CT LUMBAR MYELOGRAM FINDINGS:   The lumbar spine is imaged from the midbody of T12 through to see S3-4. Grade 1 anterolisthesis at L4-5 measures 4 mm in the midline. Grade 1 anterolisthesis at L5-S1 measures 2.5 mm in midline.   Atherosclerotic calcifications are present in the aorta branch vessel. No aneurysms are present.  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 Asymmetric right-sided renal atrophy is present. Cholecystectomy is noted.   T11-12: Negative.   T12-L1: Normal disc signal and height is present. No focal protrusion or stenosis is present.   L1-2: Mild facet  hypertrophy is present bilaterally. No focal disc protrusion or stenosis is present.   L2-3: Minimal facet hypertrophy is present. No focal disc protrusion or stenosis is present.   L3-4: Mild facet hypertrophy is worse right than left. No significant disc protrusion or stenosis present.   L4-5: A broad-based disc protrusion is present. Moderate facet hypertrophy and ligamentum flavum thickening is noted. Moderate right subarticular and severe right foraminal stenosis is present. Moderate left foraminal narrowing is present.   L5-S1: A rightward disc protrusion likely contacts the traversing right S1 nerve roots. Moderate foraminal stenosis is worse left than right. Moderate facet hypertrophy is present bilaterally, left greater than right.   IMPRESSION: 1. Grade 1 degenerative anterolisthesis at L4-5 is worse with standing and exacerbated by flexion. 2. Moderate right subarticular and severe right foraminal stenosis at L4-5. 3. Moderate left foraminal narrowing at L4-5. 4. Rightward disc protrusion at L5-S1 likely contacts the traversing right S1 nerve roots. 5. Moderate foraminal stenosis bilaterally at L5-S1 is worse left than right. 6. Mild facet hypertrophy at L1-2 and L2-3 without significant stenosis. 7. 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 8. Asymmetric right-sided renal atrophy.     Electronically Signed   By: Lonni Necessary M.D.   On: 02/21/2024 12:07     Objective:  VS:  HT:    WT:   BMI:     BP:(!) 141/77  HR:(!) 51bpm  TEMP: ( )  RESP:  Physical Exam Vitals and nursing note reviewed.  Constitutional:      General: She is not in acute distress.    Appearance: Normal appearance. She is not ill-appearing.  HENT:     Head: Normocephalic and atraumatic.     Right Ear: External ear normal.     Left Ear: External ear normal.  Eyes:     Extraocular Movements: Extraocular movements intact.  Cardiovascular:     Rate and Rhythm: Normal  rate.     Pulses: Normal pulses.  Pulmonary:     Effort: Pulmonary effort is normal. No respiratory distress.  Abdominal:     General: There is no distension.     Palpations: Abdomen is soft.  Musculoskeletal:        General: Tenderness present.     Cervical back: Neck supple.     Right lower leg: No edema.     Left lower leg: No edema.     Comments: Patient has good distal strength with no pain over the greater trochanters.  No clonus or focal weakness.  Skin:    Findings: No erythema, lesion or rash.  Neurological:     General: No focal deficit present.     Mental Status: She is alert and oriented to person, place, and time.     Sensory: No sensory deficit.     Motor: No weakness or abnormal muscle tone.     Coordination: Coordination normal.  Psychiatric:        Mood and Affect: Mood normal.        Behavior: Behavior normal.      Imaging: No results found.

## 2024-08-16 ENCOUNTER — Ambulatory Visit: Admitting: Surgical

## 2024-08-16 DIAGNOSIS — M1612 Unilateral primary osteoarthritis, left hip: Secondary | ICD-10-CM

## 2024-08-16 DIAGNOSIS — M5416 Radiculopathy, lumbar region: Secondary | ICD-10-CM

## 2024-08-18 ENCOUNTER — Encounter: Payer: Self-pay | Admitting: Surgical

## 2024-08-18 NOTE — Progress Notes (Signed)
 Follow-up Office Visit Note   Patient: Jaclyn Brooks           Date of Birth: 1961/09/14           MRN: [id number] Visit Date: 08/16/2024 Requested by: Tobie Suzzane POUR, MD [address] [place],  [place] [address] PCP: Tobie Suzzane POUR, MD  Subjective: Chief Complaint  Patient presents with   Left Leg - Pain, Follow-up    HPI: Jaclyn Brooks is a 63 y.o. female who returns to the office for follow-up visit.    Plan at last visit was: Jaclyn Brooks is a 63 y.o. female who returns to the office for follow-up visit.  Plan from last visit was noted above in HPI.  They now return with continued low back pain that has somewhat improved with physical therapy.  She is planning to continue with physical therapy.  We will also set her up for lumbar spine ESI.  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  Follow-up in 6 weeks prn   Since then, patient notes she is still looking for a job that fits her requirements.  Had recent lumbar spine ESI with Dr. Eldonna on 08/12/2024 that has not provided any significant relief yet.  Still has pain radiating down the left leg from the buttock down the posterior thigh into the calf.  She also has anterior pain in the groin that radiates down the anterior thigh consistent with her history of arthritis in the hip.  No new changes.  Has been keeping up with physical therapy with [facility] in [place].              ROS: All systems reviewed are negative as they relate to the chief complaint within the history of present illness.  Patient denies fevers or chills.  Assessment & Plan: Visit Diagnoses:  1. Lumbar radiculopathy   2. Arthritis of left hip     Plan: Jaclyn Brooks is a 63 y.o. female who returns to the office for  follow-up visit.  Plan from last visit was noted above in HPI.  They now return with continued left leg pain from multiple etiologies including her back pathology and hip arthritis.  Offered hip injection but she would still like to avoid this.  We will see how this lumbar spine ESI affects her pain given that it has only been about 3 to 4 days since the actual injection.  If she gets to 2 weeks out and there is no improvement, we could potentially try SI joint injection and see if that will help with some of her low back and buttock pain.  She will keep me posted but otherwise we will see her back in 2 months and I think continuing with occasional physical therapy is a good idea as well.  Patient agreed with plan.  Follow-Up Instructions: No follow-ups on file.   Orders:  No orders of the defined types were placed in this encounter.  No orders of the defined types were placed in this encounter.     Procedures: No procedures performed   Clinical Data: No additional findings.  Objective: Vital Signs: There were no vitals taken for this visit.  Physical Exam:  Constitutional:  Patient appears well-developed HEENT:  Head: Normocephalic Eyes:EOM are normal Neck: Normal range of motion Cardiovascular: Normal rate Pulmonary/chest: Effort normal Neurologic: Patient is alert Skin: Skin is warm Psychiatric: Patient has normal mood and affect  Ortho Exam: Ortho exam demonstrates intact hip flexion, quadricep, hamstring, dorsiflexion, plantarflexion, EHL strength rated 5/5.  Does have pain reproduced with hip range of motion and decreased passive internal rotation of the left hip consistent with arthritis.  Positive Stinchfield sign.  Does have tenderness over the left SI joint.  Specialty Comments:  CT LUMBAR MYELOGRAM FINDINGS:   The lumbar spine is imaged from the midbody of T12 through to see S3-4. Grade 1 anterolisthesis at L4-5 measures 4 mm in the midline. Grade 1 anterolisthesis  at L5-S1 measures 2.5 mm in midline.   Atherosclerotic calcifications are present in the aorta branch vessel. No aneurysms are present.   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 Asymmetric right-sided renal atrophy is present. Cholecystectomy is noted.   T11-12: Negative.   T12-L1: Normal disc signal and height is present. No focal protrusion or stenosis is present.   L1-2: Mild facet hypertrophy is present bilaterally. No focal disc protrusion or stenosis is present.   L2-3: Minimal facet hypertrophy is present. No focal disc protrusion or stenosis is present.   L3-4: Mild facet hypertrophy is worse right than left. No significant disc protrusion or stenosis present.   L4-5: A broad-based disc protrusion is present. Moderate facet hypertrophy and ligamentum flavum thickening is noted. Moderate right subarticular and severe right foraminal stenosis is present. Moderate left foraminal narrowing is present.   L5-S1: A rightward disc protrusion likely contacts the traversing right S1 nerve roots. Moderate foraminal stenosis is worse left than right. Moderate facet hypertrophy is present bilaterally, left greater than right.   IMPRESSION: 1. Grade 1 degenerative anterolisthesis at L4-5 is worse with standing and exacerbated by flexion. 2. Moderate right subarticular and severe right foraminal stenosis at L4-5. 3. Moderate left foraminal narrowing at L4-5. 4. Rightward disc protrusion at L5-S1 likely contacts the traversing right S1 nerve roots. 5. Moderate foraminal stenosis bilaterally at L5-S1 is worse left than right. 6. Mild facet hypertrophy at L1-2 and L2-3 without significant stenosis. 7. 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 8. Asymmetric right-sided renal atrophy.     Electronically Signed   By: Lonni Necessary M.D.   On: 02/21/2024 12:07  Imaging: No results found.   PMFS History: Patient Active Problem List    Diagnosis Date Noted   Oral candidiasis 07/08/2024   Lumbar radiculopathy 03/19/2024   Anxiety 12/21/2023   Flank pain 09/19/2023   RUQ pain 09/19/2023   Chronic sinusitis 08/29/2023   Enlarged skin mole 08/29/2023   Dysphagia 07/23/2023   Reactive airway disease 07/06/2023   Loose bowel movements 07/06/2023   Primary osteoarthritis of left hip 05/05/2023   Dysuria 04/16/2023   Diarrhea of presumed infectious origin 02/01/2023   Intractable migraine with aura without status migrainosus 08/15/2022   Traumatic hematoma of right lower leg 07/13/2022   History of total abdominal hysterectomy 07/04/2022   GAD (generalized anxiety disorder) 04/28/2022   Caregiver stress 04/28/2022   Chronic left-sided low back pain without sciatica 04/13/2022   Contact dermatitis 04/13/2022   Primary osteoarthritis of right knee 03/09/2022   Refused influenza vaccine 12/02/2021   Encounter for general adult medical examination with abnormal findings 11/30/2021   Epidermal cyst 11/30/2021   Acute recurrent frontal sinusitis 09/21/2021   Allergic rhinitis 08/29/2021  Nausea 08/27/2021   Hemorrhoids 07/07/2021   Gout 03/25/2021   Type 2 diabetes mellitus with other specified complication (HCC) 07/14/2020   Primary osteoarthritis of right foot 05/12/2020   GERD (gastroesophageal reflux disease) 05/31/2019   Solitary kidney, acquired 05/10/2019   Carotid arterial disease 12/11/2018   COPD, 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/01/2018   CKD (chronic kidney disease) stage 3, GFR 30-59 ml/min (HCC) 10/01/2018   Aortic valve sclerosis 10/01/2018   Hypertension 09/11/2018   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Family History  Problem Relation Age of Onset   Hypertension Mother    Heart disease Mother        Cardiac Arrest    Hypertension  Father    Cancer Father    Hypertension Sister    Cancer Brother        Bladder Cancer    Hypertension Brother    Hypertension Sister     Past Surgical History:  Procedure Laterality Date   CHOLECYSTECTOMY N/A 01/28/2015   Procedure: LAPAROSCOPIC CHOLECYSTECTOMY;  Surgeon: Oneil Budge Md, MD;  Location: [facility];  Service: General;  Laterality: N/A;   excision of bone spurs Bilateral    Big toes   renal aneurysm Right    right coil-and only has function of left kidney   TOTAL ABDOMINAL HYSTERECTOMY     X-STOP IMPLANTATION     Social History   Occupational History   Occupation: Retired  Tobacco Use   Smoking status: Former    Current packs/day: 0.00    Average packs/day: 0.3 packs/day for 20.0 years (5.0 ttl pk-yrs)    Types: Cigarettes    Start date: 1999    Quit date: 2019    Years since quitting: 6.8   Smokeless tobacco: Never  Vaping Use   Vaping status: Never Used  Substance and Sexual Activity   Alcohol use: Not Currently    Comment: occasionally   Drug use: No   Sexual activity: Yes    Birth control/protection: Surgical

## 2024-08-20 ENCOUNTER — Telehealth: Payer: Self-pay | Admitting: Radiology

## 2024-08-20 NOTE — Telephone Encounter (Signed)
 Patient emailed the following: Hello Jaclyn Brooks you are well. Will you please send [employer] request (Attn:Jacob)  Doctor Luke's office medical notes for November 7th @ fax# [phone]. Thank you very much.  Faxed per patient request.

## 2024-08-21 ENCOUNTER — Telehealth: Payer: Self-pay | Admitting: Surgical

## 2024-08-21 NOTE — Telephone Encounter (Signed)
 Received vm from patient stating she is going to bring in a form to be completed for disability. IC,lmvm advised that she can bring in whatever she has and it will be given to [facility]. Advised that I would be glad to talk with her but I won't be completing the form.

## 2024-08-22 ENCOUNTER — Ambulatory Visit: Admitting: Physical Therapy

## 2024-08-27 ENCOUNTER — Encounter: Payer: Self-pay | Admitting: *Deleted

## 2024-08-27 NOTE — Progress Notes (Signed)
 Jaclyn Brooks                                          MRN: [id number]   08/27/2024   The [facility]BCI Quality Team Specialist reviewed this patient medical record for the purposes of chart review for care gap closure. The following were reviewed: abstraction for care gap closure-kidney health evaluation for diabetes:eGFR  and uACR.    VBCI Quality Team

## 2024-08-29 ENCOUNTER — Telehealth: Payer: Self-pay | Admitting: Physical Medicine and Rehabilitation

## 2024-08-29 NOTE — Telephone Encounter (Signed)
 LMX1 for patient to call back

## 2024-08-29 NOTE — Telephone Encounter (Signed)
 Patient came in and said that the injection didn't work. Its been two weeks. CB#[phone]

## 2024-09-02 ENCOUNTER — Encounter: Payer: Self-pay | Admitting: Physical Therapy

## 2024-09-02 ENCOUNTER — Ambulatory Visit: Attending: Surgical | Admitting: Physical Therapy

## 2024-09-02 DIAGNOSIS — M25552 Pain in left hip: Secondary | ICD-10-CM | POA: Insufficient documentation

## 2024-09-02 DIAGNOSIS — R262 Difficulty in walking, not elsewhere classified: Secondary | ICD-10-CM | POA: Diagnosis present

## 2024-09-02 DIAGNOSIS — M5459 Other low back pain: Secondary | ICD-10-CM | POA: Insufficient documentation

## 2024-09-02 DIAGNOSIS — M6281 Muscle weakness (generalized): Secondary | ICD-10-CM | POA: Diagnosis present

## 2024-09-02 NOTE — Therapy (Addendum)
 OUTPATIENT PHYSICAL THERAPY TREATMENT/Discharge PHYSICAL THERAPY DISCHARGE SUMMARY  Visits from Start of Care: 7  Current functional level related to goals / functional outcomes: See below   Remaining deficits: See below   Education / Equipment: HEP  Plan:  Patient goals were not met. Patient is being discharged due to not making progress with PT.       Patient Name: Jaclyn Brooks MRN: [id number] DOB:18-Aug-1961, 63 y.o., female Today's Date: 09/02/2024  END OF SESSION:  PT End of Session - 09/02/24 1400     Visit Number 7    Number of Visits 10    Date for Recertification  07/04/24    Authorization Type [employer] Medicare    Authorization Time Period 6 visits covered (05/13/2024-06/20/2024)    Authorization - Visit Number 6    Authorization - Number of Visits 6    Progress Note Due on Visit 10    PT Start Time 1345    PT Stop Time 1400    PT Time Calculation (min) 15 min    Activity Tolerance Patient tolerated treatment well    Behavior During Therapy [facility] for tasks assessed/performed             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Past Surgical History:  Procedure Laterality Date   CHOLECYSTECTOMY N/A 01/28/2015   Procedure: LAPAROSCOPIC CHOLECYSTECTOMY;  Surgeon: Oneil Budge Md, MD;  Location: [facility];  Service: General;  Laterality: N/A;   excision of bone spurs Bilateral    Big toes   renal aneurysm Right    right coil-and only has function of left kidney   TOTAL ABDOMINAL HYSTERECTOMY     X-STOP IMPLANTATION     Patient Active Problem List   Diagnosis Date Noted   Oral candidiasis 07/08/2024   Lumbar radiculopathy 03/19/2024   Anxiety 12/21/2023   Flank pain 09/19/2023   RUQ pain 09/19/2023   Chronic sinusitis 08/29/2023   Enlarged skin mole 08/29/2023   Dysphagia  07/23/2023   Reactive airway disease 07/06/2023   Loose bowel movements 07/06/2023   Primary osteoarthritis of left hip 05/05/2023   Dysuria 04/16/2023   Diarrhea of presumed infectious origin 02/01/2023   Intractable migraine with aura without status migrainosus 08/15/2022   Traumatic hematoma of right lower leg 07/13/2022   History of total abdominal hysterectomy 07/04/2022   GAD (generalized anxiety disorder) 04/28/2022   Caregiver stress 04/28/2022   Chronic left-sided low back pain without sciatica 04/13/2022   Contact dermatitis 04/13/2022   Primary osteoarthritis of right knee 03/09/2022   Refused influenza vaccine 12/02/2021   Encounter for general adult medical examination with abnormal findings 11/30/2021   Epidermal cyst 11/30/2021   Acute recurrent frontal sinusitis 09/21/2021   Allergic rhinitis 08/29/2021   Nausea 08/27/2021   Hemorrhoids 07/07/2021   Gout 03/25/2021   Type 2 diabetes mellitus with other specified complication (HCC) 07/14/2020   Primary osteoarthritis of right foot 05/12/2020   GERD (gastroesophageal reflux disease) 05/31/2019   Solitary kidney, acquired 05/10/2019   Carotid arterial disease 12/11/2018   COPD, 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/01/2018   CKD (chronic kidney disease) stage 3, GFR 30-59 ml/min (HCC) 10/01/2018   Aortic valve sclerosis 10/01/2018   Hypertension 09/11/2018    PCP: Tobie,  Suzzane POUR, MD   REFERRING PROVIDER: Shirly Carlin CROME, PA-C   REFERRING DIAG: [phone] (ICD-10-CM) - Lumbar radiculopathy   Rationale for Evaluation and Treatment:  Rehabiliation  THERAPY DIAG:  Other low back pain  Pain in left hip  Muscle weakness (generalized)  Difficulty in walking, not elsewhere classified  ONSET DATE: End of April 2025   SUBJECTIVE:                                                                                                                                                                                           SUBJECTIVE  STATEMENT: States the pain is 9/10 today, She had a lot pain and difficulty walking today.  PERTINENT HISTORY:  See above PMH  PAIN:  NPRS scale: 9/10 upon arrival Pain location:left hip now, she had shot in her low back which helped the pain Pain description: comes and goes, tingling, dull Aggravating factors: walking,  Relieving factors: rest, meds   PRECAUTIONS: ,  None  RED FLAGS: None   WEIGHT BEARING RESTRICTIONS:  No  FALLS:  Has patient fallen in last 6 months? No   OCCUPATION:  Works for [employer]  PLOF:  Independent  PATIENT GOALS:  Reduce pain  OBJECTIVE:  Note: Objective measures were completed at Evaluation unless otherwise noted.  DIAGNOSTIC FINDINGS:  Lumbar CT IMPRESSION: 1. Grade 1 degenerative anterolisthesis at L4-5 is worse with standing and exacerbated by flexion. 2. Moderate right subarticular and severe right foraminal stenosis at L4-5. 3. Moderate left foraminal narrowing at L4-5. 4. Rightward disc protrusion at L5-S1 likely contacts the traversing right S1 nerve roots. 5. Moderate foraminal stenosis bilaterally at L5-S1 is worse left than right. 6. Mild facet hypertrophy at L1-2 and L2-3 without significant stenosis. 7. 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 8. Asymmetric right-sided renal atrophy.  PATIENT SURVEYS:  Patient-Specific Activity Scoring Scheme  0 represents "unable to perform." 10 represents "able to perform at prior level. 0 1 2 3 4 5 6 7 8 9  10 (Date and Score)   Activity Eval  09/02/24  1. Walking 20 minutes 2   4  2. Sitting for one hour  2  3  3.     4.    5.    Score 2/10 3.5/10   Total score = sum of the activity scores/number of activities Minimum detectable change (90%CI) for average score = 2 points Minimum detectable change (90%CI) for single activity score = 3 points   GAIT: Distance walked: Can walk up to 20 minutes max before too much pain Assistive device  utilized: None Level of assistance: Complete Independence Comments: antalgic  gait on left, lower gait speed   LUMBAR ROM:   Active  AROM  eval AROM 09/02/24  Flexion 50* 20*  Extension 10* 10*  Right lateral flexion 10 10*  Left lateral flexion 10* 10*  Right rotation 50% 25%  Left rotation 25% 25%   (Blank rows = not tested) *notes pain   LOWER EXTREMITY MMT:    MMT Right eval Left eval Right 09/02/24 Left 09/02/24  Hip flexion 5 4 5  3*  Hip extension      Hip abduction 5 4 5  3*  Hip adduction 5 4    Hip internal rotation      Hip external rotation      Knee flexion 5 5 5  3*  Knee extension 5 5 3  3*  Ankle dorsiflexion 5 5    Ankle plantarflexion      Ankle inversion      Ankle eversion       (Blank rows = not tested) *denotes pain    SPECIAL TESTS:  Lumbar Straight leg raise test: Positive and Slump test: Positive                                                                                                                             TREATMENT DATE:  09/02/24 Updated measurements and goals Self care: Discussed PT plan of care to discharge today due to lack of progress and she has declined with measurements and her pain has increased, recommend she follow back up with MD about other options/treatments for her pain.  PATIENT EDUCATION: Education details: HEP, keeping weight shift equal if her leg isn't hurting too much Person educated: Patient Education method: Explanation, Demonstration, Verbal cues, and Handouts Education comprehension: verbalized understanding, further education recommended   HOME EXERCISE PROGRAM: Access Code: [email or web address] URL: [email or web address] Date: 06/03/2024 Prepared by: Gellen April Earnie Starring  Exercises - Standing Lumbar Extension at [employer] - Forearms  - 2 x daily - 6 x weekly - 1 sets - 10 reps - 3 sec hold - Slump Stretch  - 2 x daily - 6 x weekly - 1 sets - 10 reps - 3 sec hold - supine IT band and  piriformis stretch  - 2 x daily - 6 x weekly - 1 sets - 3 reps - 30 hold - Supine Lower Trunk Rotation  - 2 x daily - 6 x weekly - 1 sets - 5 reps - 10 sec hold - Left Standing Lateral Shift Correction at Wall - Repetitions  - 1 x daily - 7 x weekly - 1 sets - 10 reps - Supine Posterior Pelvic Tilt  - 1 x daily - 7 x weekly - 2 sets - 10 reps - 3 sec hold - Supine Hip Abduction  - 1 x daily - 7 x weekly - 2 sets - 10 reps  ASSESSMENT:  CLINICAL IMPRESSION: This was the last PT visit she had. She attended 7 PT visits in 4 months.  I updated measurements and we discussed PT plan of care to discharge today due to lack of progress and she has declined with measurements and her pain has increased, recommend she follow back up with MD about other options/treatments for her pain. We have exhausted treatment interventions with PT and do not recommend further PT at this time. No goals were met.   OBJECTIVE IMPAIRMENTS: decreased activity tolerance for ADL's, difficulty walking,  decreased endurance, decreased mobility, decreased ROM, decreased strength, impaired flexibility, impaired LE use, and pain.  ACTIVITY LIMITATIONS: bending, lifting, carry, reaching, locomotion, cleaning, community activity, driving, and or occupation  PERSONAL FACTORS: see above PMH  also affecting patient's functional outcome.  REHAB POTENTIAL: Good  CLINICAL DECISION MAKING: Stable/uncomplicated  EVALUATION COMPLEXITY: Low    GOALS: Short term PT Goals Target date: 06/06/2024   Pt will be I and compliant with HEP. Baseline:  Goal status: not met 09/02/24 Pt will decrease pain by 25% overall Baseline: Goal status: not met 09/02/24  Long term PT goals Target date:07/04/2024   Pt will improve lumbar ROM to [facility] to improve functional mobility Baseline: Goal status: not met 09/02/24 Pt will improve  left leg strength to at least 4+/5 MMT to improve functional strength Baseline: Goal status: not met 09/02/24 Pt  will improve Patient specific functional scale (PSFS) to at least 7/10 to show improved function level Baseline: Goal status: not met 09/02/24 Pt will reduce pain to overall less than 3/10 with usual activity and work activity. Baseline: Goal status: not met 09/02/24 Pt will be able to ambulate community distances at least 1000 ft WNL gait pattern without complaints Baseline: Goal status: not met 09/02/24  PLAN: PT FREQUENCY: 1-2 times per week   PT DURATION: 6-8 weeks  PLANNED INTERVENTIONS (unless contraindicated): 97110-Therapeutic exercises, 97530- Therapeutic activity, [id number]- Neuromuscular re-education, 97535- Self Care, 02859- Manual therapy, G0283- Electrical stimulation (unattended), 97035- Ultrasound, 02987- Traction (mechanical), and 97033- Ionotophoresis 4mg /ml Dexamethasone   PLAN FOR NEXT SESSION: DC today and refer back to MD.  [facility] JONELLE Moose, PT,DPT 09/02/2024, 2:20 PM

## 2024-09-03 ENCOUNTER — Telehealth: Payer: Self-pay | Admitting: Surgical

## 2024-09-03 NOTE — Telephone Encounter (Signed)
 Pt called requsting a call back from [facility]. Call back number is [phone]

## 2024-09-04 NOTE — Telephone Encounter (Signed)
 I talked to [facility] yesterday. I advised her that her form was given to Lauren last Friday morning as Herlene and [facility] were not here. I advised Azalya that Deane is off this week and Herlene has been in and out of surgery. I advised her it would be next week before she hears back from [facility] or [facility].

## 2024-09-10 ENCOUNTER — Telehealth: Payer: Self-pay | Admitting: Surgical

## 2024-09-10 NOTE — Telephone Encounter (Signed)
 IC, lmvm advised that Jaclyn Brooks completed her form yesterday and it has been faxed.

## 2024-09-10 NOTE — Telephone Encounter (Signed)
 Pt called wanting you to call her back ASAP. She wouldn't day why. Call back number is [phone].

## 2024-09-11 ENCOUNTER — Telehealth: Payer: Self-pay | Admitting: Surgical

## 2024-09-11 NOTE — Telephone Encounter (Signed)
 Copy of form completed by Herlene on 09/09/24 mailed to patient per patients request.

## 2024-09-16 ENCOUNTER — Telehealth: Payer: Self-pay | Admitting: Orthopedic Surgery

## 2024-09-16 NOTE — Telephone Encounter (Signed)
 Pt called wanting to talk to you about her forms because they were rejected because the Dr. Didn't fill them out completely. Call back number is [phone]

## 2024-09-16 NOTE — Telephone Encounter (Signed)
 I tried to reach patient. Left voicemail advising I tried to reach her and that she could return call. Did explain that we are closing the office early today due to weather and that I will try her again from the [facility] office tomorrow if I have not heard from her.

## 2024-09-17 ENCOUNTER — Telehealth: Payer: Self-pay | Admitting: Orthopedic Surgery

## 2024-09-17 NOTE — Telephone Encounter (Signed)
 Pt called stating she returning a call to [facility] and asking for a call back at [phone].

## 2024-09-18 NOTE — Telephone Encounter (Signed)
Duplicate message.  I spoke with patient.

## 2024-09-18 NOTE — Telephone Encounter (Signed)
 That sounds good.  Thanks [employer].  Let me know if I need to refill the paperwork out or something

## 2024-09-19 ENCOUNTER — Telehealth: Payer: Self-pay | Admitting: Physical Medicine and Rehabilitation

## 2024-09-19 NOTE — Telephone Encounter (Signed)
 Patient came in and said that the injection didn't work. Its been two weeks. CB#[phone]

## 2024-09-20 NOTE — Telephone Encounter (Signed)
 Added information to form and faxed per patient request.

## 2024-09-28 ENCOUNTER — Other Ambulatory Visit: Payer: Self-pay | Admitting: Internal Medicine

## 2024-09-28 DIAGNOSIS — I6523 Occlusion and stenosis of bilateral carotid arteries: Secondary | ICD-10-CM

## 2024-10-01 ENCOUNTER — Ambulatory Visit: Admitting: Physical Medicine and Rehabilitation

## 2024-10-01 ENCOUNTER — Encounter: Payer: Self-pay | Admitting: Physical Medicine and Rehabilitation

## 2024-10-01 ENCOUNTER — Telehealth: Payer: Self-pay

## 2024-10-01 DIAGNOSIS — M5442 Lumbago with sciatica, left side: Secondary | ICD-10-CM | POA: Diagnosis not present

## 2024-10-01 DIAGNOSIS — G8929 Other chronic pain: Secondary | ICD-10-CM | POA: Diagnosis not present

## 2024-10-01 DIAGNOSIS — M5416 Radiculopathy, lumbar region: Secondary | ICD-10-CM

## 2024-10-01 MED ORDER — PREGABALIN 75 MG PO CAPS: ORAL_CAPSULE | ORAL | 0 refills | Status: AC

## 2024-10-01 NOTE — Progress Notes (Signed)
 Pain Scale   Average Pain 7 Patient advising the injection did not help and she still has lower back pain radiating to left leg..        +Driver, -BT, -Dye Allergies.

## 2024-10-01 NOTE — Telephone Encounter (Signed)
 Copied from CRM #[id number]. Topic: Clinical - Medication Question >> Oct 01, 2024  1:58 PM Amber H wrote: Reason for CRM: Bernarda with [facility] in [place] stated CARTIA  XT 240 MG 24 hr capsule is not currently available. However, she wanted to know if she could take 2 120mg  once per day.   [phone]

## 2024-10-01 NOTE — Progress Notes (Signed)
 "  STEPHANEY STEVEN - 63 y.o. female MRN [id number]  Date of birth: 1961/08/28  Office Visit Note: Visit Date: 10/01/2024 PCP: Tobie Suzzane POUR, MD Referred by: Tobie Suzzane POUR, MD  Subjective: Chief Complaint  Patient presents with   Lower Back - Pain   HPI: CHASITTY HEHL is a 63 y.o. female who comes in today for evaluation of chronic, worsening and severe left sided lower back pain radiating to groin and down leg. Pain ongoing since April of this year. Her pain started after lifting heavy cooler at work. States she felt something stretch and pop. Her pain worsens with movement and activity. She describes her pain as burning and sore sensation, currently rates as 8 out of 10. She believes the pain radiating down left leg is more of a tingling nerve issue. Some relief of pain with home exercise regimen, rest and use of medications. History of formal physical therapy with minimal relief of pain. CT lumbar myelogram from May of 2025 shows grade 1 degenerative anterolisthesis at L4-L5 is worse with standing and exacerbated by flexion. Moderate foraminal stenosis bilaterally at L5-S1 is worse left than right. No high grade spinal canal stenosis. She is managed by Herlene Calix, PA from orthopedic standpoint. 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 No relief of pain with this procedure. More recently, she underwent left L5 transforaminal epidural steroid injection on 04/04/2024 and left L5-S1 interlaminar epidural steroid injection on 08/12/2024. No relief of pain with these procedures. Patient denies focal weakness. No recent trauma or falls. No bowel/bladder incontinence.   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 She is currently taking Plavix .      Review of Systems  Musculoskeletal:  Positive for back pain.  Neurological:   Positive for tingling. Negative for sensory change, focal weakness and weakness.  All other systems reviewed and are negative.  Otherwise per HPI.  Assessment & Plan: Visit Diagnoses:    ICD-10-CM   1. Chronic left-sided low back pain with left-sided sciatica  M54.42 Ambulatory referral to Physical Therapy   G89.29     2. Lumbar radiculopathy  M54.16 Ambulatory referral to Physical Therapy       Plan: Findings:  Chronic, worsening and severe left sided lower back pain radiating to groin and down leg. Patient continues to have severe pain despite good conservative therapies such as formal physical therapy, home exercise regimen, rest and use of medications. Prior CT lumbar myelogram shows mostly right sided findings with disc herniation at L5-S1 contacting right S1 nerve root. Historically, her symptoms have remained more left sided. Minimal relief of pain with both interlaminar and transforaminal epidural steroid injections. Minimal relief with previous left intra-articular hip injection. We discussed treatment plan in detail today. I did prescribe Lyrica  for her to try. I would like her to start with 75 mg at bedtime for (1) week then increase to twice daily if tolerating well, for a total of 150 mg daily. I also placed order for short course of formal physical therapy. I do think she would benefit from manual treatments and core strengthening. I would like for her to try and attend PT consistently for at least 6-8 weeks and I emphasized the importance of continuing with home exercise regimen. We will see her back in 8 weeks for re-evaluation. Her exam today is non focal, good strength noted to bilateral  lower extremities.     Meds & Orders:  Meds ordered this encounter  Medications   pregabalin  (LYRICA ) 75 MG capsule    Sig: 1 tablet (75 mg) by mouth at bedtime for one week, then twice a day.    Dispense:  60 capsule    Refill:  0    Orders Placed This Encounter  Procedures   Ambulatory  referral to Physical Therapy    Follow-up: Return for 8 weeks follow up post PT/medication management.   Procedures: No procedures performed      Clinical History: CT LUMBAR MYELOGRAM FINDINGS:   The lumbar spine is imaged from the midbody of T12 through to see S3-4. Grade 1 anterolisthesis at L4-5 measures 4 mm in the midline. Grade 1 anterolisthesis at L5-S1 measures 2.5 mm in midline.   Atherosclerotic calcifications are present in the aorta branch vessel. No aneurysms are present.   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 Asymmetric right-sided renal atrophy is present. Cholecystectomy is noted.   T11-12: Negative.   T12-L1: Normal disc signal and height is present. No focal protrusion or stenosis is present.   L1-2: Mild facet hypertrophy is present bilaterally. No focal disc protrusion or stenosis is present.   L2-3: Minimal facet hypertrophy is present. No focal disc protrusion or stenosis is present.   L3-4: Mild facet hypertrophy is worse right than left. No significant disc protrusion or stenosis present.   L4-5: A broad-based disc protrusion is present. Moderate facet hypertrophy and ligamentum flavum thickening is noted. Moderate right subarticular and severe right foraminal stenosis is present. Moderate left foraminal narrowing is present.   L5-S1: A rightward disc protrusion likely contacts the traversing right S1 nerve roots. Moderate foraminal stenosis is worse left than right. Moderate facet hypertrophy is present bilaterally, left greater than right.   IMPRESSION: 1. Grade 1 degenerative anterolisthesis at L4-5 is worse with standing and exacerbated by flexion. 2. Moderate right subarticular and severe right foraminal stenosis at L4-5. 3. Moderate left foraminal narrowing at L4-5. 4. Rightward disc protrusion at L5-S1 likely contacts the traversing right S1 nerve roots. 5. Moderate foraminal stenosis bilaterally at L5-S1 is  worse left than right. 6. Mild facet hypertrophy at L1-2 and L2-3 without significant stenosis. 7. 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 8. Asymmetric right-sided renal atrophy.     Electronically Signed   By: Lonni Necessary M.D.   On: 02/21/2024 12:07   She reports that she quit smoking about 6 years ago. Her smoking use included cigarettes. She started smoking about 26 years ago. She has a 5 pack-year smoking history. She has never used smokeless tobacco.  Recent Labs    01/04/24 1420  HGBA1C 6.4*    Objective:  VS:  HT:    WT:   BMI:     BP:   HR: bpm  TEMP: ( )  RESP:  Physical Exam Vitals and nursing note reviewed.  HENT:     Head: Normocephalic and atraumatic.     Right Ear: External ear normal.     Left Ear: External ear normal.     Nose: Nose normal.     Mouth/Throat:     Mouth: Mucous membranes are moist.  Eyes:     Extraocular Movements: Extraocular movements intact.  Cardiovascular:     Rate and Rhythm: Normal rate.     Pulses: Normal pulses.  Pulmonary:     Effort: Pulmonary effort is normal.  Abdominal:     General: Abdomen  is flat. There is no distension.  Musculoskeletal:        General: Tenderness present.     Cervical back: Normal range of motion.     Comments: Patient is slow to rise from seated position to standing. Good lumbar range of motion. No pain noted with facet loading. 5/5 strength noted with bilateral hip flexion, knee flexion/extension, ankle dorsiflexion/plantarflexion and EHL. No clonus noted bilaterally. No pain upon palpation of greater trochanters. No pain with internal/external rotation of bilateral hips. Sensation intact bilaterally. Negative slump test bilaterally. Ambulates without aid, gait steady.     Skin:    General: Skin is warm and dry.     Capillary Refill: Capillary refill takes less than 2 seconds.  Neurological:     General: No focal deficit present.     Mental Status: She is alert and oriented  to person, place, and time.  Psychiatric:        Mood and Affect: Mood normal.        Behavior: Behavior normal.     Ortho Exam  Imaging: No results found.  Past Medical/Family/Surgical/Social History: Medications & Allergies reviewed per EMR, new medications updated. Patient Active Problem List   Diagnosis Date Noted   Oral candidiasis 07/08/2024   Lumbar radiculopathy 03/19/2024   Anxiety 12/21/2023   Flank pain 09/19/2023   RUQ pain 09/19/2023   Chronic sinusitis 08/29/2023   Enlarged skin mole 08/29/2023   Dysphagia 07/23/2023   Reactive airway disease 07/06/2023   Loose bowel movements 07/06/2023   Primary osteoarthritis of left hip 05/05/2023   Dysuria 04/16/2023   Diarrhea of presumed infectious origin 02/01/2023   Intractable migraine with aura without status migrainosus 08/15/2022   Traumatic hematoma of right lower leg 07/13/2022   History of total abdominal hysterectomy 07/04/2022   GAD (generalized anxiety disorder) 04/28/2022   Caregiver stress 04/28/2022   Chronic left-sided low back pain without sciatica 04/13/2022   Contact dermatitis 04/13/2022   Primary osteoarthritis of right knee 03/09/2022   Refused influenza vaccine 12/02/2021   Encounter for general adult medical examination with abnormal findings 11/30/2021   Epidermal cyst 11/30/2021   Acute recurrent frontal sinusitis 09/21/2021   Allergic rhinitis 08/29/2021   Nausea 08/27/2021   Hemorrhoids 07/07/2021   Gout 03/25/2021   Type 2 diabetes mellitus with other specified complication (HCC) 07/14/2020   Primary osteoarthritis of right foot 05/12/2020   GERD (gastroesophageal reflux disease) 05/31/2019   Solitary kidney, acquired 05/10/2019   Carotid arterial disease 12/11/2018   COPD, 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/01/2018   CKD (chronic kidney disease) stage 3, GFR 30-59 ml/min (HCC) 10/01/2018   Aortic valve sclerosis 10/01/2018   Hypertension 09/11/2018   Past Medical History:  Diagnosis Date   Allergy     Asthma    [employer] 11/25/2020   Chronic kidney disease    [employer] 11/25/2020   Depression    Gout    Hyperlipidemia    Hypertension    Renal arterial aneurysm    Family History  Problem Relation Age of Onset   Hypertension Mother    Heart disease Mother        Cardiac Arrest    Hypertension Father    Cancer Father    Hypertension Sister    Cancer Brother        Bladder Cancer    Hypertension Brother    Hypertension Sister    Past Surgical History:  Procedure Laterality Date   CHOLECYSTECTOMY N/A 01/28/2015   Procedure: LAPAROSCOPIC CHOLECYSTECTOMY;  Surgeon: Oneil Budge Md, MD;  Location: [facility];  Service: General;  Laterality: N/A;   excision of bone spurs Bilateral    Big toes   renal aneurysm Right    right coil-and only has function of left kidney   TOTAL ABDOMINAL HYSTERECTOMY     X-STOP IMPLANTATION     Social History   Occupational History   Occupation: Retired  Tobacco Use   Smoking status: Former    Current packs/day: 0.00    Average packs/day: 0.3 packs/day for 20.0 years (5.0 ttl pk-yrs)    Types: Cigarettes    Start date: 1999    Quit date: 2019    Years since quitting: 6.9   Smokeless tobacco: Never  Vaping Use   Vaping status: Never Used  Substance and Sexual Activity   Alcohol use: Not Currently    Comment: occasionally   Drug use: No   Sexual activity: Yes    Birth control/protection: Surgical    "

## 2024-10-11 ENCOUNTER — Other Ambulatory Visit: Payer: Self-pay

## 2024-10-11 ENCOUNTER — Encounter (HOSPITAL_COMMUNITY): Payer: Self-pay

## 2024-10-11 ENCOUNTER — Emergency Department (HOSPITAL_COMMUNITY)
Admission: EM | Admit: 2024-10-11 | Discharge: 2024-10-12 | Disposition: A | Discharge status: Home / Self Care | Attending: Emergency Medicine | Admitting: Emergency Medicine

## 2024-10-11 DIAGNOSIS — Z91040 Latex allergy status: Secondary | ICD-10-CM | POA: Diagnosis not present

## 2024-10-11 DIAGNOSIS — R059 Cough, unspecified: Secondary | ICD-10-CM | POA: Insufficient documentation

## 2024-10-11 DIAGNOSIS — J029 Acute pharyngitis, unspecified: Secondary | ICD-10-CM | POA: Diagnosis present

## 2024-10-11 DIAGNOSIS — J039 Acute tonsillitis, unspecified: Secondary | ICD-10-CM | POA: Diagnosis not present

## 2024-10-11 LAB — RESP PANEL BY RT-PCR (RSV, FLU A&B, COVID)  RVPGX2
Influenza A by PCR: NEGATIVE
Influenza B by PCR: NEGATIVE
Resp Syncytial Virus by PCR: NEGATIVE
SARS Coronavirus 2 by RT PCR: NEGATIVE

## 2024-10-11 LAB — GROUP A STREP BY PCR: Group A Strep by PCR: NOT DETECTED

## 2024-10-11 NOTE — ED Triage Notes (Addendum)
 Pt states that she started developing tonsil stones, cough, sore throat and fevers x1 week ago. Pt states he also thinks that she is dehydrated because she is weak.

## 2024-10-12 ENCOUNTER — Ambulatory Visit: Payer: Self-pay

## 2024-10-12 LAB — URINALYSIS, ROUTINE W REFLEX MICROSCOPIC
Bilirubin Urine: NEGATIVE
Glucose, UA: NEGATIVE mg/dL
Hgb urine dipstick: NEGATIVE
Ketones, ur: NEGATIVE mg/dL
Leukocytes,Ua: NEGATIVE
Nitrite: NEGATIVE
Protein, ur: 100 mg/dL — AB
Specific Gravity, Urine: 1.011 (ref 1.005–1.030)
pH: 5 (ref 5.0–8.0)

## 2024-10-12 MED ORDER — CEPHALEXIN 500 MG PO CAPS: 500.0000 mg | ORAL_CAPSULE | Freq: Four times a day (QID) | ORAL | 0 refills | Status: AC

## 2024-10-12 MED ORDER — CEPHALEXIN 500 MG PO CAPS
500.0000 mg | ORAL_CAPSULE | Freq: Once | ORAL | Status: AC
  Administered 2024-10-12: 500 mg via ORAL
  Filled 2024-10-12: qty 1

## 2024-10-12 NOTE — ED Provider Notes (Signed)
 " [place] EMERGENCY DEPARTMENT AT [facility] Provider Note   CSN: [id number] Arrival date & time: 10/11/24  8047     Patient presents with: No chief complaint on file.   Jaclyn Brooks is a 64 y.o. female.   Patient is a 64 year old female presenting with complaints of sore throat.  She is concerned she may have a tonsil stone.  Pain is worse when she swallows.  She denies any difficulty breathing.  No fevers or chills.  She does describe some decreased appetite and fatigue.       Prior to Admission medications  Medication Sig Start Date End Date Taking? Authorizing Provider  albuterol  (VENTOLIN  HFA) 108 (90 Base) MCG/ACT inhaler Inhale 1-2 puffs into the lungs every 6 (six) hours as needed for wheezing or shortness of breath. 07/08/24   Tobie Suzzane POUR, MD  allopurinol  (ZYLOPRIM ) 100 MG tablet Take 2 tablets by mouth once daily 12/19/23   Patel, Rutwik K, MD  atenolol  (TENORMIN ) 50 MG tablet Take 1 tablet (50 mg total) by mouth daily. 07/08/24   Tobie Suzzane POUR, MD  CARTIA  XT 240 MG 24 hr capsule Take 1 capsule (240 mg total) by mouth daily. 07/08/24   Tobie Suzzane POUR, MD  clopidogrel  (PLAVIX ) 75 MG tablet Take 1 tablet by mouth in the evening 09/30/24   Tobie Suzzane POUR, MD  dexlansoprazole  (DEXILANT ) 60 MG capsule Take 1 capsule (60 mg total) by mouth daily. 04/29/24   Stuart Vernell Norris, PA-C  FARXIGA 10 MG TABS tablet Take 10 mg by mouth daily. 07/24/24   [provider]  fluticasone -salmeterol (ADVAIR) 100-50 MCG/ACT AEPB Inhale 1 puff into the lungs 2 (two) times daily. 01/04/24   Tobie Suzzane POUR, MD  hydrochlorothiazide  (HYDRODIURIL ) 25 MG tablet Take 1 tablet (25 mg total) by mouth daily. 07/08/24   Tobie Suzzane POUR, MD  hydrocortisone  (ANUSOL -HC) 25 MG suppository Place 1 suppository (25 mg total) rectally 2 (two) times daily. 04/29/24   Stuart Vernell Norris, PA-C  hydrOXYzine  (ATARAX ) 10 MG tablet Take 1 tablet (10 mg total) by mouth every 8 (eight) hours as  needed for anxiety. 12/18/23   Chandra Harlene LABOR, NP  nystatin  (MYCOSTATIN ) 100000 UNIT/ML suspension Use as directed 5 mLs (500,000 Units total) in the mouth or throat 4 (four) times daily. Swish and swallow. 07/08/24   Tobie Suzzane POUR, MD  nystatin  cream (MYCOSTATIN ) Apply to affected area 2 times daily 07/26/24   Stuart Vernell Norris, PA-C  pregabalin  (LYRICA ) 75 MG capsule 1 tablet (75 mg) by mouth at bedtime for one week, then twice a day. 10/01/24   Williams, Megan E, NP  traMADol  (ULTRAM ) 50 MG tablet Take 1 tablet (50 mg total) by mouth every 12 (twelve) hours as needed. 07/08/24   Tobie Suzzane POUR, MD  atorvastatin  (LIPITOR) 20 MG tablet Take 1 tablet (20 mg total) by mouth at bedtime. 11/25/19 02/18/20  Bari Theodoro FALCON, MD    Allergies: Amoxil  [amoxicillin ], Bee venom, Nsaids, Shellfish allergy, and Latex    Review of Systems  All other systems reviewed and are negative.   Updated Vital Signs BP (!) 152/64 (BP Location: Right Arm)   Pulse (!) 52   Temp 98.7 F (37.1 C) (Oral)   Resp 17   SpO2 97%   Physical Exam Vitals and nursing note reviewed.  Constitutional:      General: She is not in acute distress.    Appearance: She is well-developed. She is not diaphoretic.  HENT:  Head: Normocephalic and atraumatic.  Cardiovascular:     Rate and Rhythm: Normal rate and regular rhythm.     Heart sounds: No murmur heard.    No friction rub. No gallop.  Pulmonary:     Effort: Pulmonary effort is normal. No respiratory distress.     Breath sounds: Normal breath sounds. No wheezing.  Abdominal:     General: Bowel sounds are normal. There is no distension.     Palpations: Abdomen is soft.     Tenderness: There is no abdominal tenderness.  Musculoskeletal:        General: Normal range of motion.     Cervical back: Normal range of motion and neck supple.  Skin:    General: Skin is warm and dry.  Neurological:     General: No focal deficit present.     Mental Status: She  is alert and oriented to person, place, and time.     (all labs ordered are listed, but only abnormal results are displayed) Labs Reviewed  GROUP A STREP BY PCR  RESP PANEL BY RT-PCR (RSV, FLU A&B, COVID)  [id number]  URINALYSIS, ROUTINE W REFLEX MICROSCOPIC    EKG: None  Radiology: No results found.   Procedures   Medications Ordered in the ED  cephALEXin  (KEFLEX ) capsule 500 mg (has no administration in time range)                                    Medical Decision Making Amount and/or Complexity of Data Reviewed Labs: ordered.  Risk Prescription drug management.   COVID/flu/RSV/strep are all negative.  Patient to be treated for tonsillitis with Keflex .  I will provide a referral to ENT to have her tonsils evaluated if not improving through the weekend.     Final diagnoses:  None    ED Discharge Orders     None          Geroldine Berg, MD 10/12/24 0004  "

## 2024-10-12 NOTE — Discharge Instructions (Signed)
 Taking Keflex  as prescribed.  Take ibuprofen  600 mg every 6 hours as needed for pain.  Follow-up with ENT if not improving in the next few days.  The contact information for [facility] ear, nose, and throat has been provided in this discharge summary for you to call and make these arrangements.

## 2024-10-15 ENCOUNTER — Other Ambulatory Visit: Payer: Self-pay | Admitting: Internal Medicine

## 2024-10-15 DIAGNOSIS — K219 Gastro-esophageal reflux disease without esophagitis: Secondary | ICD-10-CM

## 2024-10-18 ENCOUNTER — Ambulatory Visit: Admitting: Surgical

## 2024-10-18 ENCOUNTER — Encounter: Payer: Self-pay | Admitting: Surgical

## 2024-10-18 DIAGNOSIS — M1612 Unilateral primary osteoarthritis, left hip: Secondary | ICD-10-CM | POA: Diagnosis not present

## 2024-10-18 DIAGNOSIS — M5442 Lumbago with sciatica, left side: Secondary | ICD-10-CM

## 2024-10-18 DIAGNOSIS — G8929 Other chronic pain: Secondary | ICD-10-CM

## 2024-10-18 NOTE — Progress Notes (Signed)
 "  Follow-up Office Visit Note   Patient: Jaclyn Brooks           Date of Birth: [birth year]           MRN: [id number] Visit Date: 10/18/2024 Requested by: Tobie Suzzane POUR, MD [address] [place],  [place] [address] PCP: Tobie Suzzane POUR, MD  Subjective: Chief Complaint  Patient presents with   Lower Back - Follow-up    HPI: Jaclyn Brooks is a 64 y.o. female who returns to the office for follow-up visit.    Plan at last visit was: Jaclyn Brooks is a 64 y.o. female who returns to the office for follow-up visit.  Plan from last visit was noted above in HPI.  They now return with continued left leg pain from multiple etiologies including her back pathology and hip arthritis.  Offered hip injection but she would still like to avoid this.  We will see how this lumbar spine ESI affects her pain given that it has only been about 3 to 4 days since the actual injection.  If she gets to 2 weeks out and there is no improvement, we could potentially try SI joint injection and see if that will help with some of her low back and buttock pain.  She will keep me posted but otherwise we will see her back in 2 months and I think continuing with occasional physical therapy is a good idea as well.   Since then, patient notes she is continuing to have groin pain, thigh pain, back pain, buttock pain.  Recently saw Duwaine Pouch, NP on 10/01/2024 and agreed to try physical therapy.  She will go to physical therapy upstairs for the next few months.  Has evaluation next week.  ESI never really helped like it has in the past.  Taking over-the-counter medications.              ROS: All systems reviewed are negative as they relate to the chief complaint within the history of present illness.  Patient denies fevers or chills.  Assessment & Plan: Visit Diagnoses:  1. Chronic left-sided low back pain with left-sided sciatica     Plan: Jaclyn Brooks is a 64 y.o. female who returns to the office for follow-up visit.   Plan from last visit was noted above in HPI.  They now return with no relief from recent [facility] joint injection.  Minimal relief from prior left hip intra-articular injection.  Think that trying physical therapy is reasonable for the next few months and may have to consider surgery in the future if she does not respond to physical therapy given lack of improvement from recent injections.  After discussion of other options availed the patient, she would like to try left sacroiliac joint injection with Dr. Burnetta.  We will set this up and see her back in about 3 months for clinical recheck.  Follow-Up Instructions: No follow-ups on file.   Orders:  Orders Placed This Encounter  Procedures   AMB referral to sports medicine   No orders of the defined types were placed in this encounter.     Procedures: No procedures performed   Clinical Data: No additional findings.  Objective: Vital Signs: There were no vitals taken for this visit.  Physical Exam:  Constitutional: Patient appears well-developed HEENT:  Head: Normocephalic Eyes:EOM are normal Neck: Normal range of motion Cardiovascular: Normal rate Pulmonary/chest: Effort normal Neurologic: Patient is alert Skin: Skin is warm Psychiatric: Patient has  normal mood and affect  Ortho Exam: Ortho exam demonstrates decreased internal rotation passively of the left hip relative to the right consistent with hip arthritis.  Positive FADIR sign.  Positive Stinchfield sign.  Intact hip flexion, quad, hamstring, dorsiflexion, plantarflexion strength rated 5/5 bilaterally.  No clonus noted bilaterally.  She has tenderness over the left and right SI joints with mild tenderness over the right and more moderate tenderness over the left.  No significant axial lumbar spine tenderness.  Specialty Comments:  CT LUMBAR MYELOGRAM FINDINGS:   The lumbar spine is imaged from the midbody of T12 through to see S3-4. Grade 1 anterolisthesis at L4-5 measures 4  mm in the midline. Grade 1 anterolisthesis at L5-S1 measures 2.5 mm in midline.   Atherosclerotic calcifications are present in the aorta branch vessel. No aneurysms are present.   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 Asymmetric right-sided renal atrophy is present. Cholecystectomy is noted.   T11-12: Negative.   T12-L1: Normal disc signal and height is present. No focal protrusion or stenosis is present.   L1-2: Mild facet hypertrophy is present bilaterally. No focal disc protrusion or stenosis is present.   L2-3: Minimal facet hypertrophy is present. No focal disc protrusion or stenosis is present.   L3-4: Mild facet hypertrophy is worse right than left. No significant disc protrusion or stenosis present.   L4-5: A broad-based disc protrusion is present. Moderate facet hypertrophy and ligamentum flavum thickening is noted. Moderate right subarticular and severe right foraminal stenosis is present. Moderate left foraminal narrowing is present.   L5-S1: A rightward disc protrusion likely contacts the traversing right S1 nerve roots. Moderate foraminal stenosis is worse left than right. Moderate facet hypertrophy is present bilaterally, left greater than right.   IMPRESSION: 1. Grade 1 degenerative anterolisthesis at L4-5 is worse with standing and exacerbated by flexion. 2. Moderate right subarticular and severe right foraminal stenosis at L4-5. 3. Moderate left foraminal narrowing at L4-5. 4. Rightward disc protrusion at L5-S1 likely contacts the traversing right S1 nerve roots. 5. Moderate foraminal stenosis bilaterally at L5-S1 is worse left than right. 6. Mild facet hypertrophy at L1-2 and L2-3 without significant stenosis. 7. 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 8. Asymmetric right-sided renal atrophy.     Electronically Signed   By: Lonni Necessary M.D.   On: 02/21/2024 12:07  Imaging: No results found.   PMFS  History: Patient Active Problem List   Diagnosis Date Noted   Oral candidiasis 07/08/2024   Lumbar radiculopathy 03/19/2024   Anxiety 12/21/2023   Flank pain 09/19/2023   RUQ pain 09/19/2023   Chronic sinusitis 08/29/2023   Enlarged skin mole 08/29/2023   Dysphagia 07/23/2023   Reactive airway disease 07/06/2023   Loose bowel movements 07/06/2023   Primary osteoarthritis of left hip 05/05/2023   Dysuria 04/16/2023   Diarrhea of presumed infectious origin 02/01/2023   Intractable migraine with aura without status migrainosus 08/15/2022   Traumatic hematoma of right lower leg 07/13/2022   History of total abdominal hysterectomy 07/04/2022   GAD (generalized anxiety disorder) 04/28/2022   Caregiver stress 04/28/2022   Chronic left-sided low back pain without sciatica 04/13/2022   Contact dermatitis 04/13/2022   Primary osteoarthritis of right knee 03/09/2022   Refused influenza vaccine 12/02/2021   Encounter for general adult medical examination with abnormal findings 11/30/2021   Epidermal cyst 11/30/2021   Acute recurrent frontal sinusitis 09/21/2021   Allergic rhinitis 08/29/2021   Nausea 08/27/2021   Hemorrhoids 07/07/2021  Gout 03/25/2021   Type 2 diabetes mellitus with other specified complication (HCC) 07/14/2020   Primary osteoarthritis of right foot 05/12/2020   GERD (gastroesophageal reflux disease) 05/31/2019   Solitary kidney, acquired 05/10/2019   Carotid arterial disease 12/11/2018   COPD, 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/01/2018   CKD (chronic kidney disease) stage 3, GFR 30-59 ml/min (HCC) 10/01/2018   Aortic valve sclerosis 10/01/2018   Hypertension 09/11/2018   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Family History  Problem Relation Age of Onset   Hypertension Mother    Heart disease Mother         Cardiac Arrest    Hypertension Father    Cancer Father    Hypertension Sister    Cancer Brother        Bladder Cancer    Hypertension Brother    Hypertension Sister     Past Surgical History:  Procedure Laterality Date   CHOLECYSTECTOMY N/A 01/28/2015   Procedure: LAPAROSCOPIC CHOLECYSTECTOMY;  Surgeon: Oneil Budge Md, MD;  Location: [facility];  Service: General;  Laterality: N/A;   excision of bone spurs Bilateral    Big toes   renal aneurysm Right    right coil-and only has function of left kidney   TOTAL ABDOMINAL HYSTERECTOMY     X-STOP IMPLANTATION     Social History   Occupational History   Occupation: Retired  Tobacco Use   Smoking status: Former    Current packs/day: 0.00    Average packs/day: 0.3 packs/day for 20.0 years (5.0 ttl pk-yrs)    Types: Cigarettes    Start date: 1999    Quit date: 2019    Years since quitting: 7.0   Smokeless tobacco: Never  Vaping Use   Vaping status: Never Used  Substance and Sexual Activity   Alcohol use: Not Currently    Comment: occasionally   Drug use: No   Sexual activity: Yes    Birth control/protection: Surgical         "

## 2024-10-22 NOTE — Therapy (Signed)
 " OUTPATIENT PHYSICAL THERAPY THORACOLUMBAR EVALUATION   Patient Name: Jaclyn Brooks MRN: [id number] DOB:05-15-1961, 64 y.o., female Today's Date: 10/23/2024  END OF SESSION:  PT End of Session - 10/23/24 1451     Visit Number 1    Number of Visits 10    Date for Recertification  12/18/24    Authorization Type [employer] Medicare    Progress Note Due on Visit 10    PT Start Time 1345    PT Stop Time 1420    PT Time Calculation (min) 35 min    Activity Tolerance Patient tolerated treatment well    Behavior During Therapy [facility] for tasks assessed/performed          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Past Surgical History:  Procedure Laterality Date   CHOLECYSTECTOMY N/A 01/28/2015   Procedure: LAPAROSCOPIC CHOLECYSTECTOMY;  Surgeon: Oneil Budge Md, MD;  Location: [facility];  Service: General;  Laterality: N/A;   excision of bone spurs Bilateral    Big toes   renal aneurysm Right    right coil-and only has function of left kidney   TOTAL ABDOMINAL HYSTERECTOMY     X-STOP IMPLANTATION     Patient Active Problem List   Diagnosis Date Noted   Oral candidiasis 07/08/2024   Lumbar radiculopathy 03/19/2024   Anxiety 12/21/2023   Flank pain 09/19/2023   RUQ pain 09/19/2023   Chronic sinusitis 08/29/2023   Enlarged skin mole 08/29/2023   Dysphagia 07/23/2023   Reactive airway disease 07/06/2023   Loose bowel movements 07/06/2023   Primary osteoarthritis of left hip 05/05/2023   Dysuria 04/16/2023   Diarrhea of presumed infectious origin 02/01/2023   Intractable migraine with aura without status migrainosus 08/15/2022   Traumatic hematoma of right lower leg 07/13/2022   History of total abdominal hysterectomy 07/04/2022   GAD (generalized anxiety disorder) 04/28/2022   Caregiver stress 04/28/2022    Chronic left-sided low back pain without sciatica 04/13/2022   Contact dermatitis 04/13/2022   Primary osteoarthritis of right knee 03/09/2022   Refused influenza vaccine 12/02/2021   Encounter for general adult medical examination with abnormal findings 11/30/2021   Epidermal cyst 11/30/2021   Acute recurrent frontal sinusitis 09/21/2021   Allergic rhinitis 08/29/2021   Nausea 08/27/2021   Hemorrhoids 07/07/2021   Gout 03/25/2021   Type 2 diabetes mellitus with other specified complication (HCC) 07/14/2020   Primary osteoarthritis of right foot 05/12/2020   GERD (gastroesophageal reflux disease) 05/31/2019   Solitary kidney, acquired 05/10/2019   Carotid arterial disease 12/11/2018   COPD, 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/01/2018   CKD (chronic kidney disease) stage 3, GFR 30-59 ml/min (HCC) 10/01/2018   Aortic valve sclerosis 10/01/2018   Hypertension 09/11/2018    PCP: Tobie Suzzane POUR, MD   REFERRING PROVIDER: Trudy Duwaine BRAVO, NP   REFERRING DIAG:  [id number] (ICD-10-CM) - Chronic left-sided low back pain with left-sided sciatica  M54.16 (ICD-10-CM) - Lumbar radiculopathy   *MD Note:Eval and Treat: Chronic left sided lower back pain radiating down left leg. Consider manual treatments, core strengthening and possible dry needling *  Rationale for Evaluation and Treatment: Rehabilitation  THERAPY DIAG:  Other low back pain  Pain in left hip  Muscle weakness (generalized)  Difficulty in  walking, not elsewhere classified  ONSET DATE: April 2025  SUBJECTIVE:                                                                                                                                                                                           SUBJECTIVE STATEMENT: Started in April 2025 bent down and lifted something heavy at work. Felt a pulling sensation and pain.  Then  started having pain radiating down the L LE.  Pt states  she could barely walk.  Went to ER, and had multiple  MD visits including injections 2x.  They had minimal affect. Pain radiates in L LE posterior, anterior and groin area.  States she tried PT in the past but did not see much help.  PERTINENT HISTORY:  No spinal history or surgeries.  PAIN:  Are you having pain? Yes: NPRS scale: 7/10 Pain location: L lumbar and L LE Pain description: sharp, dull, aching Aggravating factors: repetitive walking,bending, Relieving factors: OTC, heat  PRECAUTIONS: None  RED FLAGS: Bowel or bladder incontinence: No   WEIGHT BEARING RESTRICTIONS: No  FALLS:  Has patient fallen in last 6 months? No  LIVING ENVIRONMENT: Lives with: lives with their family Lives in: House/apartment Stairs: Yes: Internal: 3 steps; on right going up Has following equipment at home: None  OCCUPATION: not working  PLOF: Independent  PATIENT GOALS: Decrease pain  NEXT MD VISIT: 11/26/24  OBJECTIVE:  Note: Objective measures were completed at Evaluation unless otherwise noted.  DIAGNOSTIC FINDINGS:  Xray 02/09/24 [facility] and views of the lumbar spine reviewed.  There is moderate degenerative  changes of the facet joints as noted at L5-S1, L4-L5, L3-L4.  She has  grade 1 spondylolisthesis noted at L4-L5 and L5-S1.  No severe loss of  disc space but there is mild to space narrowing at L4-L5.  No fracture.  MR 02/21/24 IMPRESSION: 1. Grade 1 degenerative anterolisthesis at L4-5 is worse with standing and exacerbated by flexion. 2. Moderate right subarticular and severe right foraminal stenosis at L4-5. 3. Moderate left foraminal narrowing at L4-5. 4. Rightward disc protrusion at L5-S1 likely contacts the traversing right S1 nerve roots. 5. Moderate foraminal stenosis bilaterally at L5-S1 is worse left than right. 6. Mild facet hypertrophy at L1-2 and L2-3 without significant stenosis. 7. 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 8. Asymmetric right-sided renal atrophy.  PATIENT SURVEYS:  PSFS: THE PATIENT  SPECIFIC FUNCTIONAL SCALE  Place score of 0-10 (0 = unable to perform activity and 10 = able to perform activity at the same level as before injury or problem)  Activity Date: 10/23/24    Tie shoes  2    2. In out car  2    3.     4.      Total Score 2      Total Score = Sum of activity scores/number of activities  Minimally Detectable Change: 3 points (for single activity); 2 points (for average score)  [facility] (nd). The Patient Specific Functional Scale . Retrieved from [email or web address]   COGNITION: Overall cognitive status: Within functional limits for tasks assessed     SENSATION: Not tested  MUSCLE LENGTH: Hamstrings: moderate restriction bilaterally  POSTURE: R iliac crest higher , L LE ER, slight dec L axilla space  PALPATION: 2+ tenderness L pvm, QL  LUMBAR ROM:   AROM Eval 10/23/24  Flexion 50%  Extension 0%  Right lateral flexion 25%  Left lateral flexion 25%  Right rotation 35%  Left rotation 35%   (Blank rows = not tested)EPR throughout  LOWER EXTREMITY ROM:     Active  Right eval Left eval  Hip flexion  10  Hip extension    Hip abduction  32  Hip adduction    Hip internal rotation    Hip external rotation    Knee flexion    Knee extension    Ankle dorsiflexion    Ankle plantarflexion    Ankle inversion    Ankle eversion     (Blank rows = not tested)  LOWER EXTREMITY MMT:    MMT Right eval Left eval  Hip flexion 5 4+  Hip extension    Hip abduction 5 4+  Hip adduction    Hip internal rotation    Hip external rotation    Knee flexion    Knee extension 5- 4+  Ankle dorsiflexion 5- 4-  Ankle plantarflexion 5- 4+  Ankle inversion    Ankle eversion     (Blank rows = not tested)  LUMBAR SPECIAL TESTS:  Straight leg raise test: Positive  FUNCTIONAL TESTS:  5 times sit to stand: 45 sec  GAIT: Distance walked: short community  Assistive device utilized:  Single point cane Level of assistance: Complete Independence Comments: ----  TREATMENT DATE: 10/23/24 Initial evaluation of lumbar spine completed followed by instruction and trial set of HEP.                                                                                                                                 PATIENT EDUCATION:  Education details: HEP Person educated: Patient Education method: Programmer, Multimedia, Facilities Manager, Actor cues, Verbal cues, and Handouts Education comprehension: verbalized understanding, returned demonstration, and verbal cues required  HOME EXERCISE PROGRAM: Access Code: [email or web address] URL: [email or web address] Date: 10/23/2024 Prepared by: Burnard Meth  Exercises - Seated March  - 2 x daily - 7 x weekly - 2 sets - 10 reps - Seated Long Arc Quad  - 2 x daily - 7 x weekly - 2 sets - 10 reps - Seated Hip Adduction Squeeze with  Ball  - 2 x daily - 7 x weekly - 1 sets - 10 reps - 10 hold - Hooklying Single Knee to Chest Stretch with Towel  - 2 x daily - 7 x weekly - 1 sets - 3 reps - 25 hold  ASSESSMENT:  CLINICAL IMPRESSION: Patient is a 63y.o. female who was seen today for physical therapy evaluation and treatment for low back pain with left-sided sciatica.  Patient presents with decreased range of motion, radiating pain, decreased strength, decreased flexibility, antalgic gait and pain.  Patient would benefit from skilled physical therapy services to address these deficits and return to previous L0A.  OBJECTIVE IMPAIRMENTS: difficulty walking, decreased ROM, decreased strength, impaired flexibility, and pain.   ACTIVITY LIMITATIONS: lifting, bending, sitting, standing, squatting, transfers, and bed mobility  PARTICIPATION LIMITATIONS: meal prep, community activity, and occupation  PERSONAL FACTORS: 1-2 comorbidities: HTN, previous PT are also affecting patient's functional outcome.   REHAB POTENTIAL: Fair see above  CLINICAL DECISION  MAKING: Stable/uncomplicated  EVALUATION COMPLEXITY: Moderate   GOALS: Goals reviewed with patient? Yes  SHORT TERM GOALS: Target date: 11/13/2024    Patient to be independent with HEP. Baseline: Goal status: INITIAL  2.  Decrease pain by 1 level. Baseline:  Goal status: INITIAL  LONG TERM GOALS: Target date: 12/18/2024  8 weeks    Patient to be independent with self progressive HEP at discharge. Baseline:  Goal status: INITIAL  2.  Decrease max pain to 2 out of 10 with all activities. Baseline:  Goal status: INITIAL  3.  Centralize radiating pain in the left leg by 25% or more. Baseline:  Goal status: INITIAL  4.  Increase active range of motion by 25%. Baseline:  Goal status: INITIAL  5.  Increase strength of lumbar stabilizers, hip, and lower extremity musculature where deficits are present by at least a half a grade to 1 full grade. Baseline:  Goal status: INITIAL  6.  Increase score of PSFS by at least 3 points to demonstrate measurable improvement. Baseline:  Goal status: INITIAL  PLAN:  PT FREQUENCY: 1-2x/week  PT DURATION: 8 weeks  PLANNED INTERVENTIONS: 97164- PT Re-evaluation, 97110-Therapeutic exercises, 97530- Therapeutic activity, 97112- Neuromuscular re-education, 97535- Self Care, [id number]- Manual therapy, [phone]- Aquatic Therapy, [phone]- Electrical stimulation (unattended), [phone] (1-2 muscles), 20561 (3+ muscles)- Dry Needling, Patient/Family education, Joint mobilization, Spinal mobilization, Cryotherapy, and Moist heat.  PLAN FOR NEXT SESSION: Review HEP   Burnard CHRISTELLA Meth, PT 10/23/2024, 2:57 PM   Date of referral: 10/01/24 Referring provider: Trudy Duwaine BRAVO NP Referring diagnosis?  M54.42,G89.29 (ICD-10-CM) - Chronic left-sided low back pain with left-sided sciatica  M54.16 (ICD-10-CM) - Lumbar radiculopathy   Treatment diagnosis? (if different than referring diagnosis) Other low back pain  Pain in left hip  Muscle weakness  (generalized)  Difficulty in walking, not elsewhere classified  What was this (referring dx) caused by? Work-related  Nature of Condition: Chronic (continuous duration > 3 months)   Laterality: Lt  Current Functional Measure Score: Patient Specific Functional Scale 2  Objective measurements identify impairments when they are compared to normal values, the uninvolved extremity, and prior level of function.  [x]  Yes  []  No  Objective assessment of functional ability: Moderate functional limitations   Briefly describe symptoms: Pt has decreased ROM, decreased strength, pain and antalgic gait due to LBP.  How did symptoms start: Pt was bending at work and lifting a heavy cooler up and had severe pain in the low back.  Average pain intensity:  Last 24 hours: 7/10  Past week: 8/10  How often does the pt experience symptoms? Frequently  How much have the symptoms interfered with usual daily activities? Quite a bit  How has condition changed since care began at this facility? NA - initial visit  In general, how is the patients overall health? Good   BACK PAIN (STarT Back Screening Tool) Has pain spread down the leg(s) at some time in the last 2 weeks? y Has the pt only walked short distances because of back pain? y Has patient dressed more slowly because of back pain in the past 2 weeks? y Does patient have worrying thoughts a lot of the time? y Does patient feel back pain is terrible and will never get any better? y Has patient stopped enjoying things they usually enjoy? y  "

## 2024-10-23 ENCOUNTER — Ambulatory Visit

## 2024-10-23 ENCOUNTER — Telehealth: Payer: Self-pay | Admitting: Surgical

## 2024-10-23 ENCOUNTER — Telehealth: Payer: Self-pay

## 2024-10-23 ENCOUNTER — Other Ambulatory Visit: Payer: Self-pay

## 2024-10-23 DIAGNOSIS — M5459 Other low back pain: Secondary | ICD-10-CM

## 2024-10-23 DIAGNOSIS — M6281 Muscle weakness (generalized): Secondary | ICD-10-CM | POA: Diagnosis not present

## 2024-10-23 DIAGNOSIS — M25552 Pain in left hip: Secondary | ICD-10-CM

## 2024-10-23 DIAGNOSIS — R262 Difficulty in walking, not elsewhere classified: Secondary | ICD-10-CM | POA: Diagnosis not present

## 2024-10-23 NOTE — Telephone Encounter (Signed)
 Patient dropped off a note requesting images from the back xray on 02/11/24. Per LF, okay to do without auth.  Please mail to patient once ready.  Mailing address [address] [place], [place] [address] Thank you!

## 2024-10-23 NOTE — Telephone Encounter (Signed)
 Mychart message sent to patient regarding forms that she dropped off.

## 2024-10-23 NOTE — Telephone Encounter (Signed)
 Jacob from [employer] would like to have the pt's next visit info faxed to him. Fax number is [phone]. Call back number is [phone].

## 2024-10-24 NOTE — Telephone Encounter (Signed)
 IC Lang @ [facility]. Lmvm advising that he will need to fax a request for medical records with patients auth. Advised that requests for medical records are not processed in house, they are processed at [facility] medical records department. Advised to fax the request to [phone]. Upon receiving the request, it will be forwarded to [facility]

## 2024-10-25 NOTE — Telephone Encounter (Signed)
 CD is in the mail bin

## 2024-10-25 NOTE — Telephone Encounter (Signed)
 Fantastic Thank you

## 2024-10-29 ENCOUNTER — Ambulatory Visit: Admitting: Physical Therapy

## 2024-10-29 ENCOUNTER — Encounter: Payer: Self-pay | Admitting: Physical Therapy

## 2024-10-29 DIAGNOSIS — M5459 Other low back pain: Secondary | ICD-10-CM | POA: Diagnosis not present

## 2024-10-29 DIAGNOSIS — M6281 Muscle weakness (generalized): Secondary | ICD-10-CM | POA: Diagnosis not present

## 2024-10-29 DIAGNOSIS — M25552 Pain in left hip: Secondary | ICD-10-CM

## 2024-10-29 DIAGNOSIS — R262 Difficulty in walking, not elsewhere classified: Secondary | ICD-10-CM

## 2024-10-29 NOTE — Therapy (Signed)
 " OUTPATIENT PHYSICAL THERAPY THORACOLUMBAR TREATMENT    Patient Name: Jaclyn Brooks MRN: [id number] DOB:[birth year], 64 y.o., female Today's Date: 10/29/2024  END OF SESSION:  PT End of Session - 10/29/24 9177     Visit Number 2    Number of Visits 10    Date for Recertification  12/18/24    Authorization Type [employer]    Authorization Time Period 16 visits 1/14 to 3/11    Authorization - Visit Number 1    Authorization - Number of Visits 16    Progress Note Due on Visit 10    PT Start Time 0806    PT Stop Time 0845    PT Time Calculation (min) 39 min    Activity Tolerance Patient tolerated treatment well    Behavior During Therapy [facility] for tasks assessed/performed           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Past Surgical History:  Procedure Laterality Date   CHOLECYSTECTOMY N/A 01/28/2015   Procedure: LAPAROSCOPIC CHOLECYSTECTOMY;  Surgeon: Oneil Budge Md, MD;  Location: [facility];  Service: General;  Laterality: N/A;   excision of bone spurs Bilateral    Big toes   renal aneurysm Right    right coil-and only has function of left kidney   TOTAL ABDOMINAL HYSTERECTOMY     X-STOP IMPLANTATION     Patient Active Problem List   Diagnosis Date Noted   Oral candidiasis 07/08/2024   Lumbar radiculopathy 03/19/2024   Anxiety 12/21/2023   Flank pain 09/19/2023   RUQ pain 09/19/2023   Chronic sinusitis 08/29/2023   Enlarged skin mole 08/29/2023   Dysphagia 07/23/2023   Reactive airway disease 07/06/2023   Loose bowel movements 07/06/2023   Primary osteoarthritis of left hip 05/05/2023   Dysuria 04/16/2023   Diarrhea of presumed infectious origin 02/01/2023   Intractable migraine with aura without status migrainosus 08/15/2022   Traumatic hematoma of right lower leg 07/13/2022    History of total abdominal hysterectomy 07/04/2022   GAD (generalized anxiety disorder) 04/28/2022   Caregiver stress 04/28/2022   Chronic left-sided low back pain without sciatica 04/13/2022   Contact dermatitis 04/13/2022   Primary osteoarthritis of right knee 03/09/2022   Refused influenza vaccine 12/02/2021   Encounter for general adult medical examination with abnormal findings 11/30/2021   Epidermal cyst 11/30/2021   Acute recurrent frontal sinusitis 09/21/2021   Allergic rhinitis 08/29/2021   Nausea 08/27/2021   Hemorrhoids 07/07/2021   Gout 03/25/2021   Type 2 diabetes mellitus with other specified complication (HCC) 07/14/2020   Primary osteoarthritis of right foot 05/12/2020   GERD (gastroesophageal reflux disease) 05/31/2019   Solitary kidney, acquired 05/10/2019   Carotid arterial disease 12/11/2018   COPD, 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/01/2018   CKD (chronic kidney disease) stage 3, GFR 30-59 ml/min (HCC) 10/01/2018   Aortic valve sclerosis 10/01/2018   Hypertension 09/11/2018    PCP: Tobie Suzzane POUR, MD   REFERRING PROVIDER: Trudy Duwaine BRAVO, NP   REFERRING DIAG:  [phone] (ICD-10-CM) - Chronic left-sided low back pain with left-sided sciatica  M54.16 (ICD-10-CM) - Lumbar radiculopathy   *MD Note:Eval and Treat: Chronic left sided lower back pain radiating down left leg. Consider manual treatments, core strengthening and possible  dry needling *  Rationale for Evaluation and Treatment: Rehabilitation  THERAPY DIAG:  Other low back pain  Pain in left hip  Muscle weakness (generalized)  Difficulty in walking, not elsewhere classified  ONSET DATE: April 2025  SUBJECTIVE:                                                                                                                                                                                           SUBJECTIVE STATEMENT:  Pain is about the same. Would like to drop to 1x/week due to co-pay/financial concerns.     EVAL: Started in April 2025 bent down and lifted something heavy at work. Felt a pulling sensation and pain.  Then  started having pain radiating down the L LE.  Pt states  she could barely walk.  Went to ER, and had multiple MD visits including injections 2x.  They had minimal affect. Pain radiates in L LE posterior, anterior and groin area.  States she tried PT in the past but did not see much help.  PERTINENT HISTORY:  No spinal history or surgeries.  PAIN:  Are you having pain? Yes: NPRS scale: 7/10 Pain location: L lumbar and L LE (can go to bottom of foot) Pain description: sharp, dull, aching Aggravating factors: repetitive walking,bending, Relieving factors: OTC, heat  PRECAUTIONS: None  RED FLAGS: Bowel or bladder incontinence: No   WEIGHT BEARING RESTRICTIONS: No  FALLS:  Has patient fallen in last 6 months? No  LIVING ENVIRONMENT: Lives with: lives with their family Lives in: House/apartment Stairs: Yes: Internal: 3 steps; on right going up Has following equipment at home: None  OCCUPATION: not working  PLOF: Independent  PATIENT GOALS: Decrease pain  NEXT MD VISIT: 11/26/24  OBJECTIVE:  Note: Objective measures were completed at Evaluation unless otherwise noted.  DIAGNOSTIC FINDINGS:  Xray 02/09/24 [facility] and views of the lumbar spine reviewed.  There is moderate degenerative  changes of the facet joints as noted at L5-S1, L4-L5, L3-L4.  She has  grade 1 spondylolisthesis noted at L4-L5 and L5-S1.  No severe loss of  disc space but there is mild to space narrowing at L4-L5.  No fracture.  MR 02/21/24 IMPRESSION: 1. Grade 1 degenerative anterolisthesis at L4-5 is worse with standing and exacerbated by flexion. 2. Moderate right subarticular and severe right foraminal stenosis at L4-5. 3. Moderate left foraminal narrowing at L4-5. 4. Rightward disc protrusion at L5-S1 likely contacts the traversing right S1 nerve roots. 5. Moderate foraminal stenosis  bilaterally at L5-S1 is worse left than right. 6. Mild facet hypertrophy at L1-2 and L2-3 without significant stenosis. 7. 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 8. Asymmetric right-sided renal atrophy.  PATIENT SURVEYS:  PSFS: THE PATIENT SPECIFIC FUNCTIONAL SCALE  Place score of 0-10 (0 = unable to perform activity and 10 = able to perform activity at the same level as before injury or problem)  Activity Date: 10/23/24    Tie shoes 2    2. In out car  2    3.     4.      Total Score 2      Total Score = Sum of activity scores/number of activities  Minimally Detectable Change: 3 points (for single activity); 2 points (for average score)  [facility] (nd). The Patient Specific Functional Scale . Retrieved from [email or web address]   COGNITION: Overall cognitive status: Within functional limits for tasks assessed     SENSATION: Not tested  MUSCLE LENGTH: Hamstrings: moderate restriction bilaterally  POSTURE: R iliac crest higher , L LE ER, slight dec L axilla space  PALPATION: 2+ tenderness L pvm, QL  LUMBAR ROM:   AROM Eval 10/23/24  Flexion 50%  Extension 0%  Right lateral flexion 25%  Left lateral flexion 25%  Right rotation 35%  Left rotation 35%   (Blank rows = not tested)EPR throughout  LOWER EXTREMITY ROM:     Active  Right eval Left eval  Hip flexion  10  Hip extension    Hip abduction  32  Hip adduction    Hip internal rotation    Hip external rotation    Knee flexion    Knee extension    Ankle dorsiflexion    Ankle plantarflexion    Ankle inversion    Ankle eversion     (Blank rows = not tested)  LOWER EXTREMITY MMT:    MMT Right eval Left eval  Hip flexion 5 4+  Hip extension    Hip abduction 5 4+  Hip adduction    Hip internal rotation    Hip external rotation    Knee flexion    Knee extension 5- 4+  Ankle dorsiflexion 5- 4-  Ankle plantarflexion 5-  4+  Ankle inversion    Ankle eversion     (Blank rows = not tested)  LUMBAR SPECIAL TESTS:  Straight leg raise test: Positive  FUNCTIONAL TESTS:  5 times sit to stand: 45 sec  GAIT: Distance walked: short community  Assistive device utilized: Single point cane Level of assistance: Complete Independence Comments: ----  TREATMENT DATE:   10/29/24  Lumbar rotation stretch 10x3 seconds, alternating  SKTC with towel behind knee 5x3 seconds B Hooklying TA sets 12x3 seconds  Hooklying TA set + mini-march 10x3 seconds  Figure 4 stretch hooklying 2x30 seconds Hooklying HS stretch 1x30 seconds B (limited by pain L) Bridges x10 partial range  Sidelying clams no resistance x10 B QL stretch forward with stool 3x30 seconds Gentle standing lumbar extensions to tolerance x10 Nustep L1x5 min seat 9, BLEs only     10/23/24   Initial evaluation of lumbar spine completed followed by instruction and trial set of HEP.  PATIENT EDUCATION:  Education details: HEP Person educated: Patient Education method: Programmer, Multimedia, Facilities Manager, Actor cues, Verbal cues, and Handouts Education comprehension: verbalized understanding, returned demonstration, and verbal cues required  HOME EXERCISE PROGRAM: Access Code: [email or web address] URL: [email or web address] Date: 10/23/2024 Prepared by: Burnard Meth  Exercises - Seated March  - 2 x daily - 7 x weekly - 2 sets - 10 reps - Seated Long Arc Quad  - 2 x daily - 7 x weekly - 2 sets - 10 reps - Seated Hip Adduction Squeeze with Ball  - 2 x daily - 7 x weekly - 1 sets - 10 reps - 10 hold - Hooklying Single Knee to Chest Stretch with Towel  - 2 x daily - 7 x weekly - 1 sets - 3 reps - 25 hold  ASSESSMENT:  CLINICAL IMPRESSION:  Arrives today doing OK, unfortunately still having quite a bit of back pain. We worked on  gentle stretching and core strengthening as tolerated today. Will continue to attempt to address pain and improve function.     EVAL: Patient is a 63y.o. female who was seen today for physical therapy evaluation and treatment for low back pain with left-sided sciatica.  Patient presents with decreased range of motion, radiating pain, decreased strength, decreased flexibility, antalgic gait and pain.  Patient would benefit from skilled physical therapy services to address these deficits and return to previous L0A.  OBJECTIVE IMPAIRMENTS: difficulty walking, decreased ROM, decreased strength, impaired flexibility, and pain.   ACTIVITY LIMITATIONS: lifting, bending, sitting, standing, squatting, transfers, and bed mobility  PARTICIPATION LIMITATIONS: meal prep, community activity, and occupation  PERSONAL FACTORS: 1-2 comorbidities: HTN, previous PT are also affecting patient's functional outcome.   REHAB POTENTIAL: Fair see above  CLINICAL DECISION MAKING: Stable/uncomplicated  EVALUATION COMPLEXITY: Moderate   GOALS: Goals reviewed with patient? Yes  SHORT TERM GOALS: Target date: 11/13/2024    Patient to be independent with HEP. Baseline: Goal status: INITIAL  2.  Decrease pain by 1 level. Baseline:  Goal status: INITIAL  LONG TERM GOALS: Target date: 12/18/2024  8 weeks    Patient to be independent with self progressive HEP at discharge. Baseline:  Goal status: INITIAL  2.  Decrease max pain to 2 out of 10 with all activities. Baseline:  Goal status: INITIAL  3.  Centralize radiating pain in the left leg by 25% or more. Baseline:  Goal status: INITIAL  4.  Increase active range of motion by 25%. Baseline:  Goal status: INITIAL  5.  Increase strength of lumbar stabilizers, hip, and lower extremity musculature where deficits are present by at least a half a grade to 1 full grade. Baseline:  Goal status: INITIAL  6.  Increase score of PSFS by at least 3 points to  demonstrate measurable improvement. Baseline:  Goal status: INITIAL  PLAN:  PT FREQUENCY: 1-2x/week  PT DURATION: 8 weeks  PLANNED INTERVENTIONS: 97164- PT Re-evaluation, 97110-Therapeutic exercises, 97530- Therapeutic activity, 97112- Neuromuscular re-education, 97535- Self Care, 02859- Manual therapy, [phone]- Aquatic Therapy, [phone]- Electrical stimulation (unattended), [phone] (1-2 muscles), 20561 (3+ muscles)- Dry Needling, Patient/Family education, Joint mobilization, Spinal mobilization, Cryotherapy, and Moist heat.  PLAN FOR NEXT SESSION: Review HEP PRN, continue gentle lumbar ROM and hip/core strength    Josette Rough, PT, DPT 10/29/24 8:47 AM   Date of referral: 10/01/24 Referring provider: Trudy Duwaine BRAVO NP Referring diagnosis?  M54.42,G89.29 (ICD-10-CM) - Chronic left-sided low back pain with left-sided sciatica  M54.16 (ICD-10-CM) - Lumbar radiculopathy   Treatment diagnosis? (if  different than referring diagnosis) Other low back pain  Pain in left hip  Muscle weakness (generalized)  Difficulty in walking, not elsewhere classified  What was this (referring dx) caused by? Work-related  Nature of Condition: Chronic (continuous duration > 3 months)   Laterality: Lt  Current Functional Measure Score: Patient Specific Functional Scale 2  Objective measurements identify impairments when they are compared to normal values, the uninvolved extremity, and prior level of function.  [x]  Yes  []  No  Objective assessment of functional ability: Moderate functional limitations   Briefly describe symptoms: Pt has decreased ROM, decreased strength, pain and antalgic gait due to LBP.  How did symptoms start: Pt was bending at work and lifting a heavy cooler up and had severe pain in the low back.  Average pain intensity:  Last 24 hours: 7/10  Past week: 8/10  How often does the pt experience symptoms? Frequently  How much have the symptoms interfered with usual daily  activities? Quite a bit  How has condition changed since care began at this facility? NA - initial visit  In general, how is the patients overall health? Good   BACK PAIN (STarT Back Screening Tool) Has pain spread down the leg(s) at some time in the last 2 weeks? y Has the pt only walked short distances because of back pain? y Has patient dressed more slowly because of back pain in the past 2 weeks? y Does patient have worrying thoughts a lot of the time? y Does patient feel back pain is terrible and will never get any better? y Has patient stopped enjoying things they usually enjoy? y  "

## 2024-10-30 ENCOUNTER — Encounter: Admitting: Physical Therapy

## 2024-11-06 ENCOUNTER — Encounter: Admitting: Physical Therapy

## 2024-11-06 NOTE — Therapy (Incomplete)
 " OUTPATIENT PHYSICAL THERAPY THORACOLUMBAR TREATMENT    Patient Name: Jaclyn Brooks MRN: [id number] DOB:22-Nov-1960, 64 y.o., female Today's Date: 11/06/2024  END OF SESSION:     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Past Surgical History:  Procedure Laterality Date   CHOLECYSTECTOMY N/A 01/28/2015   Procedure: LAPAROSCOPIC CHOLECYSTECTOMY;  Surgeon: Oneil Budge Md, MD;  Location: [facility];  Service: General;  Laterality: N/A;   excision of bone spurs Bilateral    Big toes   renal aneurysm Right    right coil-and only has function of left kidney   TOTAL ABDOMINAL HYSTERECTOMY     X-STOP IMPLANTATION     Patient Active Problem List   Diagnosis Date Noted   Oral candidiasis 07/08/2024   Lumbar radiculopathy 03/19/2024   Anxiety 12/21/2023   Flank pain 09/19/2023   RUQ pain 09/19/2023   Chronic sinusitis 08/29/2023   Enlarged skin mole 08/29/2023   Dysphagia 07/23/2023   Reactive airway disease 07/06/2023   Loose bowel movements 07/06/2023   Primary osteoarthritis of left hip 05/05/2023   Dysuria 04/16/2023   Diarrhea of presumed infectious origin 02/01/2023   Intractable migraine with aura without status migrainosus 08/15/2022   Traumatic hematoma of right lower leg 07/13/2022   History of total abdominal hysterectomy 07/04/2022   GAD (generalized anxiety disorder) 04/28/2022   Caregiver stress 04/28/2022   Chronic left-sided low back pain without sciatica 04/13/2022   Contact dermatitis 04/13/2022   Primary osteoarthritis of right knee 03/09/2022   Refused influenza vaccine 12/02/2021   Encounter for general adult medical examination with abnormal findings 11/30/2021   Epidermal cyst 11/30/2021   Acute recurrent frontal sinusitis 09/21/2021   Allergic rhinitis 08/29/2021   Nausea  08/27/2021   Hemorrhoids 07/07/2021   Gout 03/25/2021   Type 2 diabetes mellitus with other specified complication (HCC) 07/14/2020   Primary osteoarthritis of right foot 05/12/2020   GERD (gastroesophageal reflux disease) 05/31/2019   Solitary kidney, acquired 05/10/2019   Carotid arterial disease 12/11/2018   COPD, 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/01/2018   CKD (chronic kidney disease) stage 3, GFR 30-59 ml/min (HCC) 10/01/2018   Aortic valve sclerosis 10/01/2018   Hypertension 09/11/2018    PCP: Tobie Suzzane POUR, MD   REFERRING PROVIDER: Tobie Suzzane POUR, MD   REFERRING DIAG:  [phone] (ICD-10-CM) - Chronic left-sided low back pain with left-sided sciatica  M54.16 (ICD-10-CM) - Lumbar radiculopathy   *MD Note:Eval and Treat: Chronic left sided lower back pain radiating down left leg. Consider manual treatments, core strengthening and possible dry needling *  Rationale for Evaluation and Treatment: Rehabilitation  THERAPY DIAG:  No diagnosis found.  ONSET DATE: April 2025  SUBJECTIVE:  SUBJECTIVE STATEMENT:  ***Pain is about the same. Would like to drop to 1x/week due to co-pay/financial concerns.    EVAL: Started in April 2025 bent down and lifted something heavy at work. Felt a pulling sensation and pain.  Then  started having pain radiating down the L LE.  Pt states  she could barely walk.  Went to ER, and had multiple MD visits including injections 2x.  They had minimal affect. Pain radiates in L LE posterior, anterior and groin area.  States she tried PT in the past but did not see much help.  PERTINENT HISTORY:  No spinal history or surgeries.  PAIN:  ***Are you having pain? Yes: NPRS scale: 7/10 Pain location: L lumbar and L LE (can go to bottom of foot) Pain description: sharp, dull,  aching Aggravating factors: repetitive walking,bending, Relieving factors: OTC, heat  PRECAUTIONS: None  RED FLAGS: Bowel or bladder incontinence: No   WEIGHT BEARING RESTRICTIONS: No  FALLS:  Has patient fallen in last 6 months? No  LIVING ENVIRONMENT: Lives with: lives with their family Lives in: House/apartment Stairs: Yes: Internal: 3 steps; on right going up Has following equipment at home: None  OCCUPATION: not working  PLOF: Independent  PATIENT GOALS: Decrease pain  NEXT MD VISIT: 11/26/24  OBJECTIVE:  Note: Objective measures were completed at Evaluation unless otherwise noted.  DIAGNOSTIC FINDINGS:  Xray 02/09/24 [facility] and views of the lumbar spine reviewed.  There is moderate degenerative  changes of the facet joints as noted at L5-S1, L4-L5, L3-L4.  She has  grade 1 spondylolisthesis noted at L4-L5 and L5-S1.  No severe loss of  disc space but there is mild to space narrowing at L4-L5.  No fracture.  MR 02/21/24 IMPRESSION: 1. Grade 1 degenerative anterolisthesis at L4-5 is worse with standing and exacerbated by flexion. 2. Moderate right subarticular and severe right foraminal stenosis at L4-5. 3. Moderate left foraminal narrowing at L4-5. 4. Rightward disc protrusion at L5-S1 likely contacts the traversing right S1 nerve roots. 5. Moderate foraminal stenosis bilaterally at L5-S1 is worse left than right. 6. Mild facet hypertrophy at L1-2 and L2-3 without significant stenosis. 7. 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 8. Asymmetric right-sided renal atrophy.  PATIENT SURVEYS:  PSFS: THE PATIENT SPECIFIC FUNCTIONAL SCALE  Place score of 0-10 (0 = unable to perform activity and 10 = able to perform activity at the same level as before injury or problem)  Activity Date: 10/23/24    Tie shoes 2    2. In out car  2    3.     4.      Total Score 2      Total Score = Sum of activity scores/number of activities  Minimally Detectable Change: 3  points (for single activity); 2 points (for average score)  [facility] (nd). The Patient Specific Functional Scale . Retrieved from [email or web address]   COGNITION: Overall cognitive status: Within functional limits for tasks assessed     SENSATION: Not tested  MUSCLE LENGTH: Hamstrings: moderate restriction bilaterally  POSTURE: R iliac crest higher , L LE ER, slight dec L axilla space  PALPATION: 2+ tenderness L pvm, QL  LUMBAR ROM:   AROM Eval 10/23/24  Flexion 50%  Extension 0%  Right lateral flexion 25%  Left lateral flexion 25%  Right rotation 35%  Left rotation 35%   (Blank rows = not tested)EPR throughout  LOWER EXTREMITY ROM:     Active  Right eval Left  eval  Hip flexion  10  Hip extension    Hip abduction  32  Hip adduction    Hip internal rotation    Hip external rotation    Knee flexion    Knee extension    Ankle dorsiflexion    Ankle plantarflexion    Ankle inversion    Ankle eversion     (Blank rows = not tested)  LOWER EXTREMITY MMT:    MMT Right eval Left eval  Hip flexion 5 4+  Hip extension    Hip abduction 5 4+  Hip adduction    Hip internal rotation    Hip external rotation    Knee flexion    Knee extension 5- 4+  Ankle dorsiflexion 5- 4-  Ankle plantarflexion 5- 4+  Ankle inversion    Ankle eversion     (Blank rows = not tested)  LUMBAR SPECIAL TESTS:  Straight leg raise test: Positive  FUNCTIONAL TESTS:  5 times sit to stand: 45 sec  GAIT: Distance walked: short community  Assistive device utilized: Single point cane Level of assistance: Complete Independence Comments: ----  TREATMENT DATE:  11/09/27***      10/29/24  Lumbar rotation stretch 10x3 seconds, alternating  SKTC with towel behind knee 5x3 seconds B Hooklying TA sets 12x3 seconds  Hooklying TA set + mini-march 10x3 seconds  Figure 4 stretch hooklying 2x30 seconds Hooklying HS  stretch 1x30 seconds B (limited by pain L) Bridges x10 partial range  Sidelying clams no resistance x10 B QL stretch forward with stool 3x30 seconds Gentle standing lumbar extensions to tolerance x10 Nustep L1x5 min seat 9, BLEs only     10/23/24   Initial evaluation of lumbar spine completed followed by instruction and trial set of HEP.                                                                                                                                 PATIENT EDUCATION:  Education details: HEP Person educated: Patient Education method: Programmer, Multimedia, Facilities Manager, Actor cues, Verbal cues, and Handouts Education comprehension: verbalized understanding, returned demonstration, and verbal cues required  HOME EXERCISE PROGRAM: Access Code: [email or web address] URL: [email or web address] Date: 10/23/2024 Prepared by: Burnard Meth  Exercises - Seated March  - 2 x daily - 7 x weekly - 2 sets - 10 reps - Seated Long Arc Quad  - 2 x daily - 7 x weekly - 2 sets - 10 reps - Seated Hip Adduction Squeeze with Ball  - 2 x daily - 7 x weekly - 1 sets - 10 reps - 10 hold - Hooklying Single Knee to Chest Stretch with Towel  - 2 x daily - 7 x weekly - 1 sets - 3 reps - 25 hold  ASSESSMENT:  CLINICAL IMPRESSION:  ***Arrives today doing OK, unfortunately still having quite a bit of back pain. We worked on gentle stretching and core strengthening as tolerated today. Will continue to  attempt to address pain and improve function.     EVAL: Patient is a 63y.o. female who was seen today for physical therapy evaluation and treatment for low back pain with left-sided sciatica.  Patient presents with decreased range of motion, radiating pain, decreased strength, decreased flexibility, antalgic gait and pain.  Patient would benefit from skilled physical therapy services to address these deficits and return to previous L0A.  OBJECTIVE IMPAIRMENTS: difficulty walking, decreased ROM,  decreased strength, impaired flexibility, and pain.   ACTIVITY LIMITATIONS: lifting, bending, sitting, standing, squatting, transfers, and bed mobility  PARTICIPATION LIMITATIONS: meal prep, community activity, and occupation  PERSONAL FACTORS: 1-2 comorbidities: HTN, previous PT are also affecting patient's functional outcome.   REHAB POTENTIAL: Fair see above  CLINICAL DECISION MAKING: Stable/uncomplicated  EVALUATION COMPLEXITY: Moderate   GOALS: Goals reviewed with patient? Yes  SHORT TERM GOALS: Target date: 11/13/2024***    Patient to be independent with HEP. Baseline: Goal status: INITIAL  2.  Decrease pain by 1 level. Baseline:  Goal status: INITIAL  LONG TERM GOALS: Target date: 12/18/2024  8 weeks    Patient to be independent with self progressive HEP at discharge. Baseline:  Goal status: INITIAL  2.  Decrease max pain to 2 out of 10 with all activities. Baseline:  Goal status: INITIAL  3.  Centralize radiating pain in the left leg by 25% or more. Baseline:  Goal status: INITIAL  4.  Increase active range of motion by 25%. Baseline:  Goal status: INITIAL  5.  Increase strength of lumbar stabilizers, hip, and lower extremity musculature where deficits are present by at least a half a grade to 1 full grade. Baseline:  Goal status: INITIAL  6.  Increase score of PSFS by at least 3 points to demonstrate measurable improvement. Baseline:  Goal status: INITIAL  PLAN:  PT FREQUENCY: 1-2x/week  PT DURATION: 8 weeks  PLANNED INTERVENTIONS: 97164- PT Re-evaluation, 97110-Therapeutic exercises, 97530- Therapeutic activity, 97112- Neuromuscular re-education, 97535- Self Care, [id number]- Manual therapy, [phone]- Aquatic Therapy, [phone]- Electrical stimulation (unattended), [phone] (1-2 muscles), [phone] (3+ muscles)- Dry Needling, Patient/Family education, Joint mobilization, Spinal mobilization, Cryotherapy, and Moist heat.  PLAN FOR NEXT SESSION: ***Review HEP PRN,  continue gentle lumbar ROM and hip/core strength    Burnard Meth, PT 11/06/24  9:18 AM    Date of referral: 10/01/24 Referring provider: Trudy Duwaine BRAVO NP Referring diagnosis?  M54.42,G89.29 (ICD-10-CM) - Chronic left-sided low back pain with left-sided sciatica  M54.16 (ICD-10-CM) - Lumbar radiculopathy   Treatment diagnosis? (if different than referring diagnosis) Other low back pain  Pain in left hip  Muscle weakness (generalized)  Difficulty in walking, not elsewhere classified  What was this (referring dx) caused by? Work-related  Nature of Condition: Chronic (continuous duration > 3 months)   Laterality: Lt  Current Functional Measure Score: Patient Specific Functional Scale 2  Objective measurements identify impairments when they are compared to normal values, the uninvolved extremity, and prior level of function.  [x]  Yes  []  No  Objective assessment of functional ability: Moderate functional limitations   Briefly describe symptoms: Pt has decreased ROM, decreased strength, pain and antalgic gait due to LBP.  How did symptoms start: Pt was bending at work and lifting a heavy cooler up and had severe pain in the low back.  Average pain intensity:  Last 24 hours: 7/10  Past week: 8/10  How often does the pt experience symptoms? Frequently  How much have the symptoms interfered with usual daily activities? Quite  a bit  How has condition changed since care began at this facility? NA - initial visit  In general, how is the patients overall health? Good   BACK PAIN (STarT Back Screening Tool) Has pain spread down the leg(s) at some time in the last 2 weeks? y Has the pt only walked short distances because of back pain? y Has patient dressed more slowly because of back pain in the past 2 weeks? y Does patient have worrying thoughts a lot of the time? y Does patient feel back pain is terrible and will never get any better? y Has patient stopped enjoying  things they usually enjoy? y  "

## 2024-11-08 ENCOUNTER — Telehealth: Payer: Self-pay

## 2024-11-08 ENCOUNTER — Encounter

## 2024-11-08 NOTE — Progress Notes (Signed)
" ° °  11/08/2024  Patient ID: Jaclyn Brooks, female   DOB: 28-Apr-1961, 64 y.o.   MRN: [id number]  Pharmacy Quality Measure Review  This patient is appearing on the insurance-providing list for being at risk of failing the adherence measure for Statin Therapy for Patients with Cardiovascular Disease (SPC) and Statin Use in Persons with Diabetes (SUPD) medications this calendar year.  Per review of chart and payor information, patient has a diagnosis of cardiovascular disease but is not currently filling a statin prescription.   Per review of chart and payor information, patient has filled a medication indicated for diabetes this calendar year but is not currently filling a statin prescription.   Left note in upcoming PCP appt 01/06/25 - unclear if patient had ever taken statin in the past. Appears atorvastatin  may have been declined d/t just being a new medication. Would recommend rosuvastatin 5mg  or atorvastatin  10mg  as appropriate  Lang Sieve, PharmD, BCGP Clinical Pharmacist  [phone] "

## 2024-11-08 NOTE — Telephone Encounter (Signed)
 Missed appointment. LMOVM reminding her of next appointment and phone #.

## 2024-11-11 ENCOUNTER — Encounter

## 2024-11-13 ENCOUNTER — Ambulatory Visit

## 2024-11-13 DIAGNOSIS — M5459 Other low back pain: Secondary | ICD-10-CM | POA: Diagnosis not present

## 2024-11-13 DIAGNOSIS — M25552 Pain in left hip: Secondary | ICD-10-CM | POA: Diagnosis not present

## 2024-11-13 DIAGNOSIS — M6281 Muscle weakness (generalized): Secondary | ICD-10-CM | POA: Diagnosis not present

## 2024-11-13 DIAGNOSIS — R262 Difficulty in walking, not elsewhere classified: Secondary | ICD-10-CM

## 2024-11-18 ENCOUNTER — Encounter

## 2024-11-20 ENCOUNTER — Encounter

## 2024-11-25 ENCOUNTER — Encounter

## 2024-11-26 ENCOUNTER — Ambulatory Visit: Admitting: Physical Medicine and Rehabilitation

## 2024-11-27 ENCOUNTER — Encounter: Admitting: Sports Medicine

## 2024-11-27 ENCOUNTER — Encounter

## 2025-01-06 ENCOUNTER — Ambulatory Visit: Admitting: Internal Medicine

## 2025-01-17 ENCOUNTER — Ambulatory Visit: Admitting: Surgical

## 2025-02-18 ENCOUNTER — Ambulatory Visit
